# Patient Record
Sex: Female | Born: 1990
Health system: Southern US, Community
[De-identification: ages and names within clinical notes are randomized; demographics above are authoritative.]

## PROBLEM LIST (undated history)

## (undated) DIAGNOSIS — M419 Scoliosis, unspecified: Secondary | ICD-10-CM

## (undated) DIAGNOSIS — I4891 Unspecified atrial fibrillation: Secondary | ICD-10-CM

## (undated) DIAGNOSIS — J45909 Unspecified asthma, uncomplicated: Secondary | ICD-10-CM

## (undated) DIAGNOSIS — T7840XA Allergy, unspecified, initial encounter: Secondary | ICD-10-CM

## (undated) DIAGNOSIS — S83209A Unspecified tear of unspecified meniscus, current injury, unspecified knee, initial encounter: Secondary | ICD-10-CM

## (undated) DIAGNOSIS — I493 Ventricular premature depolarization: Secondary | ICD-10-CM

## (undated) DIAGNOSIS — I10 Essential (primary) hypertension: Secondary | ICD-10-CM

## (undated) DIAGNOSIS — R000 Tachycardia, unspecified: Secondary | ICD-10-CM

## (undated) DIAGNOSIS — F419 Anxiety disorder, unspecified: Secondary | ICD-10-CM

## (undated) DIAGNOSIS — B029 Zoster without complications: Secondary | ICD-10-CM

## (undated) DIAGNOSIS — F32A Depression, unspecified: Secondary | ICD-10-CM

## (undated) DIAGNOSIS — K219 Gastro-esophageal reflux disease without esophagitis: Secondary | ICD-10-CM

## (undated) DIAGNOSIS — N83209 Unspecified ovarian cyst, unspecified side: Secondary | ICD-10-CM

## (undated) HISTORY — DX: Ventricular premature depolarization: I49.3

## (undated) HISTORY — DX: Zoster without complications: B02.9

## (undated) HISTORY — DX: Unspecified asthma, uncomplicated: J45.909

## (undated) HISTORY — PX: MENISCUS REPAIR: SHX5179

## (undated) HISTORY — DX: Anxiety disorder, unspecified: F41.9

## (undated) HISTORY — DX: Allergy, unspecified, initial encounter: T78.40XA

## (undated) HISTORY — DX: Depression, unspecified: F32.A

## (undated) HISTORY — DX: Unspecified atrial fibrillation: I48.91

## (undated) HISTORY — DX: Scoliosis, unspecified: M41.9

## (undated) HISTORY — DX: Unspecified ovarian cyst, unspecified side: N83.209

---
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
## 2009-10-12 ENCOUNTER — Emergency Department (HOSPITAL_COMMUNITY): Admission: EM | Admit: 2009-10-12 | Discharge: 2009-10-12 | Payer: Self-pay | Admitting: Family Medicine

## 2010-02-20 ENCOUNTER — Emergency Department (HOSPITAL_COMMUNITY): Admission: EM | Admit: 2010-02-20 | Discharge: 2010-02-20 | Payer: Self-pay | Admitting: Emergency Medicine

## 2010-04-07 ENCOUNTER — Emergency Department (HOSPITAL_COMMUNITY)
Admission: EM | Admit: 2010-04-07 | Discharge: 2010-04-07 | Payer: Self-pay | Source: Home / Self Care | Admitting: Family Medicine

## 2010-07-15 LAB — POCT RAPID STREP A (OFFICE): Streptococcus, Group A Screen (Direct): NEGATIVE

## 2010-08-05 LAB — RAPID URINE DRUG SCREEN, HOSP PERFORMED
Amphetamines: NOT DETECTED
Barbiturates: NOT DETECTED
Benzodiazepines: NOT DETECTED
Cocaine: NOT DETECTED
Opiates: NOT DETECTED
Tetrahydrocannabinol: NOT DETECTED

## 2010-08-05 LAB — URINALYSIS, ROUTINE W REFLEX MICROSCOPIC
Bilirubin Urine: NEGATIVE
Glucose, UA: NEGATIVE mg/dL
Hgb urine dipstick: NEGATIVE
Ketones, ur: NEGATIVE mg/dL
Nitrite: NEGATIVE
Protein, ur: NEGATIVE mg/dL
Specific Gravity, Urine: 1.015 (ref 1.005–1.030)
Urobilinogen, UA: 0.2 mg/dL (ref 0.0–1.0)
pH: 6 (ref 5.0–8.0)

## 2010-08-05 LAB — URINE MICROSCOPIC-ADD ON

## 2010-08-05 LAB — POCT PREGNANCY, URINE: Preg Test, Ur: NEGATIVE

## 2010-10-14 ENCOUNTER — Inpatient Hospital Stay (INDEPENDENT_AMBULATORY_CARE_PROVIDER_SITE_OTHER)
Admission: RE | Admit: 2010-10-14 | Discharge: 2010-10-14 | Disposition: A | Discharge status: Home / Self Care | Payer: 59 | Source: Ambulatory Visit | Attending: Family Medicine | Admitting: Family Medicine

## 2010-10-14 DIAGNOSIS — N390 Urinary tract infection, site not specified: Secondary | ICD-10-CM

## 2010-10-14 LAB — POCT PREGNANCY, URINE: Preg Test, Ur: NEGATIVE

## 2010-10-14 LAB — POCT URINALYSIS DIP (DEVICE)
Glucose, UA: 500 mg/dL — AB
Hgb urine dipstick: NEGATIVE
Nitrite: POSITIVE — AB
Protein, ur: 100 mg/dL — AB
Specific Gravity, Urine: 1.01 (ref 1.005–1.030)
Urobilinogen, UA: 4 mg/dL — ABNORMAL HIGH (ref 0.0–1.0)
pH: 5 (ref 5.0–8.0)

## 2010-10-22 ENCOUNTER — Emergency Department (HOSPITAL_COMMUNITY)
Admission: EM | Admit: 2010-10-22 | Discharge: 2010-10-22 | Disposition: A | Discharge status: Home / Self Care | Payer: 59 | Attending: Emergency Medicine | Admitting: Emergency Medicine

## 2010-10-22 DIAGNOSIS — R300 Dysuria: Secondary | ICD-10-CM | POA: Insufficient documentation

## 2010-10-22 DIAGNOSIS — F988 Other specified behavioral and emotional disorders with onset usually occurring in childhood and adolescence: Secondary | ICD-10-CM | POA: Insufficient documentation

## 2010-10-22 DIAGNOSIS — N898 Other specified noninflammatory disorders of vagina: Secondary | ICD-10-CM | POA: Insufficient documentation

## 2010-10-22 DIAGNOSIS — R109 Unspecified abdominal pain: Secondary | ICD-10-CM | POA: Insufficient documentation

## 2010-10-22 DIAGNOSIS — N949 Unspecified condition associated with female genital organs and menstrual cycle: Secondary | ICD-10-CM | POA: Insufficient documentation

## 2010-10-22 LAB — BASIC METABOLIC PANEL
BUN: 13 mg/dL (ref 6–23)
CO2: 25 mEq/L (ref 19–32)
Calcium: 9.7 mg/dL (ref 8.4–10.5)
Chloride: 103 mEq/L (ref 96–112)
Creatinine, Ser: 0.59 mg/dL (ref 0.50–1.10)
GFR calc Af Amer: >60 mL/min (ref >60)
GFR calc non Af Amer: >60 mL/min (ref >60)
Glucose, Bld: 85 mg/dL (ref 70–99)
Potassium: 4 mEq/L (ref 3.5–5.1)
Sodium: 135 mEq/L (ref 135–145)

## 2010-10-22 LAB — URINALYSIS, ROUTINE W REFLEX MICROSCOPIC
Bilirubin Urine: NEGATIVE
Glucose, UA: NEGATIVE mg/dL
Hgb urine dipstick: NEGATIVE
Ketones, ur: NEGATIVE mg/dL
Nitrite: NEGATIVE
Protein, ur: NEGATIVE mg/dL
Specific Gravity, Urine: 1.018 (ref 1.005–1.030)
Urobilinogen, UA: 0.2 mg/dL (ref 0.0–1.0)
pH: 6.5 (ref 5.0–8.0)

## 2010-10-22 LAB — DIFFERENTIAL
Basophils Absolute: 0 10*3/uL (ref 0.0–0.1)
Basophils Relative: 0 % (ref 0–1)
Eosinophils Absolute: 0.3 10*3/uL (ref 0.0–0.7)
Eosinophils Relative: 3 % (ref 0–5)
Lymphocytes Relative: 24 % (ref 12–46)
Lymphs Abs: 1.9 10*3/uL (ref 0.7–4.0)
Monocytes Absolute: 0.4 10*3/uL (ref 0.1–1.0)
Monocytes Relative: 5 % (ref 3–12)
Neutro Abs: 5.6 10*3/uL (ref 1.7–7.7)
Neutrophils Relative %: 67 % (ref 43–77)

## 2010-10-22 LAB — URINE MICROSCOPIC-ADD ON

## 2010-10-22 LAB — CBC
HCT: 40.6 % (ref 36.0–46.0)
Hemoglobin: 14 g/dL (ref 12.0–15.0)
MCH: 28.2 pg (ref 26.0–34.0)
MCHC: 34.5 g/dL (ref 30.0–36.0)
MCV: 81.9 fL (ref 78.0–100.0)
Platelets: 211 10*3/uL (ref 150–400)
RBC: 4.96 MIL/uL (ref 3.87–5.11)
RDW: 13.1 % (ref 11.5–15.5)
WBC: 8.2 10*3/uL (ref 4.0–10.5)

## 2010-10-22 LAB — WET PREP, GENITAL
Clue Cells Wet Prep HPF POC: NONE SEEN
Trich, Wet Prep: NONE SEEN
Yeast Wet Prep HPF POC: NONE SEEN

## 2010-10-22 LAB — POCT PREGNANCY, URINE: Preg Test, Ur: NEGATIVE

## 2010-10-23 LAB — GC/CHLAMYDIA PROBE AMP, GENITAL
Chlamydia, DNA Probe: NEGATIVE
GC Probe Amp, Genital: NEGATIVE

## 2010-12-26 ENCOUNTER — Other Ambulatory Visit (HOSPITAL_COMMUNITY): Payer: 59

## 2010-12-26 ENCOUNTER — Emergency Department (HOSPITAL_COMMUNITY): Payer: 59

## 2010-12-26 ENCOUNTER — Emergency Department (HOSPITAL_COMMUNITY)
Admission: EM | Admit: 2010-12-26 | Discharge: 2010-12-26 | Disposition: A | Discharge status: Home / Self Care | Payer: 59 | Attending: Emergency Medicine | Admitting: Emergency Medicine

## 2010-12-26 DIAGNOSIS — R109 Unspecified abdominal pain: Secondary | ICD-10-CM | POA: Insufficient documentation

## 2010-12-26 DIAGNOSIS — R300 Dysuria: Secondary | ICD-10-CM | POA: Insufficient documentation

## 2010-12-26 DIAGNOSIS — N949 Unspecified condition associated with female genital organs and menstrual cycle: Secondary | ICD-10-CM | POA: Insufficient documentation

## 2010-12-26 DIAGNOSIS — F988 Other specified behavioral and emotional disorders with onset usually occurring in childhood and adolescence: Secondary | ICD-10-CM | POA: Insufficient documentation

## 2010-12-26 DIAGNOSIS — N898 Other specified noninflammatory disorders of vagina: Secondary | ICD-10-CM | POA: Insufficient documentation

## 2010-12-26 LAB — URINALYSIS, ROUTINE W REFLEX MICROSCOPIC
Bilirubin Urine: NEGATIVE
Glucose, UA: NEGATIVE mg/dL
Hgb urine dipstick: NEGATIVE
Ketones, ur: NEGATIVE mg/dL
Nitrite: NEGATIVE
Protein, ur: NEGATIVE mg/dL
Specific Gravity, Urine: 1.016 (ref 1.005–1.030)
Urobilinogen, UA: 0.2 mg/dL (ref 0.0–1.0)
pH: 7 (ref 5.0–8.0)

## 2010-12-26 LAB — WET PREP, GENITAL
Clue Cells Wet Prep HPF POC: NONE SEEN
Trich, Wet Prep: NONE SEEN
Yeast Wet Prep HPF POC: NONE SEEN

## 2010-12-26 LAB — URINE MICROSCOPIC-ADD ON

## 2010-12-26 LAB — POCT PREGNANCY, URINE: Preg Test, Ur: NEGATIVE

## 2010-12-27 LAB — GC/CHLAMYDIA PROBE AMP, GENITAL
Chlamydia, DNA Probe: NEGATIVE
GC Probe Amp, Genital: NEGATIVE

## 2011-01-01 ENCOUNTER — Ambulatory Visit (INDEPENDENT_AMBULATORY_CARE_PROVIDER_SITE_OTHER): Payer: 59

## 2011-01-01 ENCOUNTER — Inpatient Hospital Stay (INDEPENDENT_AMBULATORY_CARE_PROVIDER_SITE_OTHER)
Admission: RE | Admit: 2011-01-01 | Discharge: 2011-01-01 | Disposition: A | Discharge status: Home / Self Care | Payer: 59 | Source: Ambulatory Visit | Attending: Family Medicine | Admitting: Family Medicine

## 2011-01-01 DIAGNOSIS — S93409A Sprain of unspecified ligament of unspecified ankle, initial encounter: Secondary | ICD-10-CM

## 2011-05-03 DIAGNOSIS — F900 Attention-deficit hyperactivity disorder, predominantly inattentive type: Secondary | ICD-10-CM | POA: Insufficient documentation

## 2011-12-14 ENCOUNTER — Emergency Department (HOSPITAL_COMMUNITY): Payer: Worker's Compensation

## 2011-12-14 ENCOUNTER — Encounter (HOSPITAL_COMMUNITY): Payer: Self-pay | Admitting: *Deleted

## 2011-12-14 ENCOUNTER — Emergency Department (HOSPITAL_COMMUNITY)
Admission: EM | Admit: 2011-12-14 | Discharge: 2011-12-15 | Disposition: A | Discharge status: Home / Self Care | Payer: Worker's Compensation | Attending: Emergency Medicine | Admitting: Emergency Medicine

## 2011-12-14 DIAGNOSIS — W268XXA Contact with other sharp object(s), not elsewhere classified, initial encounter: Secondary | ICD-10-CM | POA: Insufficient documentation

## 2011-12-14 DIAGNOSIS — S61209A Unspecified open wound of unspecified finger without damage to nail, initial encounter: Secondary | ICD-10-CM | POA: Insufficient documentation

## 2011-12-14 DIAGNOSIS — IMO0002 Reserved for concepts with insufficient information to code with codable children: Secondary | ICD-10-CM

## 2011-12-14 NOTE — ED Notes (Signed)
Pt sustained laceration to R index finger from broken glass tonight at work.

## 2011-12-15 ENCOUNTER — Encounter (HOSPITAL_COMMUNITY): Payer: Self-pay | Admitting: *Deleted

## 2011-12-15 ENCOUNTER — Emergency Department (HOSPITAL_COMMUNITY)
Admission: EM | Admit: 2011-12-15 | Discharge: 2011-12-15 | Disposition: A | Discharge status: Home / Self Care | Payer: 59 | Source: Home / Self Care | Attending: Emergency Medicine | Admitting: Emergency Medicine

## 2011-12-15 DIAGNOSIS — N390 Urinary tract infection, site not specified: Secondary | ICD-10-CM

## 2011-12-15 LAB — POCT URINALYSIS DIP (DEVICE)
Glucose, UA: 500 mg/dL — AB
Hgb urine dipstick: NEGATIVE
Ketones, ur: 15 mg/dL — AB
Nitrite: POSITIVE — AB
Protein, ur: 100 mg/dL — AB
Specific Gravity, Urine: 1.015 (ref 1.005–1.030)
Urobilinogen, UA: 4 mg/dL — ABNORMAL HIGH (ref 0.0–1.0)
pH: 5 (ref 5.0–8.0)

## 2011-12-15 LAB — POCT PREGNANCY, URINE: Preg Test, Ur: NEGATIVE

## 2011-12-15 MED ORDER — CEPHALEXIN 500 MG PO CAPS: 500.0000 mg | ORAL_CAPSULE | Freq: Three times a day (TID) | ORAL | Status: AC

## 2011-12-15 MED ORDER — HYDROCODONE-ACETAMINOPHEN 5-325 MG PO TABS: 1.0000 | ORAL_TABLET | Freq: Four times a day (QID) | ORAL | Status: AC | PRN

## 2011-12-15 MED ORDER — HYDROCODONE-ACETAMINOPHEN 5-325 MG PO TABS: 1.0000 | ORAL_TABLET | Freq: Four times a day (QID) | ORAL | Status: DC | PRN

## 2011-12-15 NOTE — ED Provider Notes (Signed)
History     CSN: [id number]  Arrival date & time 12/15/11  1519   First MD Initiated Contact with Patient 12/15/11 1522      Chief Complaint  Patient presents with  . Urinary Tract Infection    (Consider location/radiation/quality/duration/timing/severity/associated sxs/prior treatment) HPI Comments: Patient presents to urgent care complaining of burning, pressure, increased frequency with urination for the last 3 days. No vaginal discharge, denies any fevers, nausea vomiting or flank pain. She feels it's a urine infection.  Patient is a 21 y.o. female presenting with urinary tract infection. The history is provided by the patient.  Urinary Tract Infection This is a new problem. The current episode started more than 2 days ago. The problem occurs constantly. The problem has not changed since onset.Pertinent negatives include no abdominal pain and no headaches. She has tried nothing for the symptoms.    History reviewed. No pertinent past medical history.  History reviewed. No pertinent past surgical history.  No family history on file.  History  Substance Use Topics  . Smoking status: Never Smoker   . Smokeless tobacco: Not on file  . Alcohol Use: Yes    OB History    Grav Para Term Preterm Abortions TAB SAB Ect Mult Living                  Review of Systems  Constitutional: Negative for fever, activity change, appetite change and fatigue.  Gastrointestinal: Negative for abdominal pain.  Genitourinary: Positive for dysuria, urgency and frequency. Negative for hematuria, flank pain, decreased urine volume, vaginal bleeding, vaginal discharge, genital sores, vaginal pain and pelvic pain.  Neurological: Negative for headaches.    Allergies  Review of patient's allergies indicates no known allergies.  Home Medications   Current Outpatient Rx  Name Route Sig Dispense Refill  . CEPHALEXIN 500 MG PO CAPS Oral Take 1 capsule (500 mg total) by mouth 3 (three) times  daily. 21 capsule 0  . HYDROCODONE-ACETAMINOPHEN 5-325 MG PO TABS Oral Take 1 tablet by mouth every 6 (six) hours as needed for pain. 15 tablet 0    BP 118/72  Pulse 76  Temp 97.8 F (36.6 C) (Oral)  Resp 18  SpO2 98%  LMP 12/04/2011  Physical Exam  Nursing note and vitals reviewed. Constitutional: She appears well-developed and well-nourished.  Pulmonary/Chest: Effort normal and breath sounds normal.  Abdominal: She exhibits no distension and no mass. There is tenderness. There is no rebound.    Neurological: She is alert.  Skin: Skin is warm. No rash noted. No erythema.    ED Course  Procedures (including critical care time)  Labs Reviewed  POCT URINALYSIS DIP (DEVICE) - Abnormal; Notable for the following:    Glucose, UA 500 (*)     Bilirubin Urine SMALL (*)     Ketones, ur 15 (*)     Protein, ur 100 (*)     Urobilinogen, UA 4.0 (*)     Nitrite POSITIVE (*)     Leukocytes, UA LARGE (*)  Biochemical Testing Only. Please order routine urinalysis from main lab if confirmatory testing is needed.   All other components within normal limits  POCT PREGNANCY, URINE   Dg Finger Index Right  12/14/2011  *RADIOLOGY REPORT*  Clinical Data: Laceration to right index finger.  RIGHT INDEX FINGER 2+V  Comparison: None.  Findings: Gauze material overlies the right second finger.  Bones appear intact. No evidence of acute fracture or subluxation.  No focal bone lesions.  Bone  matrix and cortex appear intact.  No abnormal radiopaque densities in the soft tissues.  IMPRESSION: No acute bony abnormalities.  No radiopaque soft tissue foreign bodies.  Original Report Authenticated By: Marlon Pel, M.D.     1. Urinary tract infection       MDM  Uncomplicated urinary tract infection. Afebrile no flank pain, normal soft abdomen. Patient was provided with a prescription of Keflex to be used for 7 days and encourage her to drink abundant fluids. Have also describe what symptoms should  warrant further evaluation of her return and to expect a remarkable improvement in the next 48 hours.        Jimmie Molly, MD 12/15/11 [phone]

## 2011-12-15 NOTE — ED Notes (Signed)
Pt is here with complaints of urinary urgency, frequency and burning X 3 days.  Pt hurt finger at work and was seen in [facility] ED last night, but did not mention possible UTI at that time.

## 2011-12-15 NOTE — ED Provider Notes (Signed)
History     CSN: [id number]  Arrival date & time 12/14/11  2214   First MD Initiated Contact with Patient 12/15/11 0001      Chief Complaint  Patient presents with  . Extremity Laceration    (Consider location/radiation/quality/duration/timing/severity/associated sxs/prior treatment) Patient is a 21 y.o. female presenting with skin laceration. The history is provided by the patient.  Laceration  The incident occurred 3 to 5 hours ago. Pain location: right index finger, palmer surface. The laceration is 1 cm in size. The laceration mechanism was a broken glass. The pain is at a severity of 5/10. The pain is mild. The pain has been constant since onset. She reports no foreign bodies present. Her tetanus status is UTD.    History reviewed. No pertinent past medical history.  History reviewed. No pertinent past surgical history.  History reviewed. No pertinent family history.  History  Substance Use Topics  . Smoking status: Never Smoker   . Smokeless tobacco: Not on file  . Alcohol Use: Yes    OB History    Grav Para Term Preterm Abortions TAB SAB Ect Mult Living                  Review of Systems  Constitutional: Negative for fever, diaphoresis and activity change.  HENT: Negative for congestion and neck pain.   Respiratory: Negative for cough.   Genitourinary: Negative for dysuria.  Musculoskeletal: Negative for myalgias.  Skin: Positive for wound. Negative for color change.  Neurological: Negative for weakness, numbness and headaches.  All other systems reviewed and are negative.    Allergies  Review of patient's allergies indicates no known allergies.  Home Medications   Current Outpatient Rx  Name Route Sig Dispense Refill  . HYDROCODONE-ACETAMINOPHEN 5-325 MG PO TABS Oral Take 1 tablet by mouth every 6 (six) hours as needed for pain. 15 tablet 0    BP 147/83  Pulse 90  Temp 97.6 F (36.4 C) (Oral)  Resp 17  Ht 5\' 4"  (1.626 m)  Wt 215 lb (97.523  kg)  BMI 36.90 kg/m2  SpO2 99%  LMP 12/04/2011  Physical Exam  Nursing note and vitals reviewed. Constitutional: She is oriented to person, place, and time. She appears well-developed and well-nourished. No distress.  HENT:  Head: Normocephalic and atraumatic.  Eyes: Conjunctivae and EOM are normal.  Neck: Normal range of motion.  Cardiovascular:       Intact distal pulses, capillary refill less than 3 seconds  Pulmonary/Chest: Effort normal.  Musculoskeletal: Normal range of motion.       Normal range of motion including flexion and extension of MCP, DIP, and PIP  Neurological: She is alert and oriented to person, place, and time.       Intact distal sensation.  Skin: Skin is warm and dry. No rash noted. She is not diaphoretic.       1 cm laceration located on right index finger palmar surface across PIP.  Bleeding controlled.  Psychiatric: She has a normal mood and affect. Her behavior is normal.    ED Course  Procedures (including critical care time)  Labs Reviewed - No data to display Dg Finger Index Right  12/14/2011  *RADIOLOGY REPORT*  Clinical Data: Laceration to right index finger.  RIGHT INDEX FINGER 2+V  Comparison: None.  Findings: Gauze material overlies the right second finger.  Bones appear intact. No evidence of acute fracture or subluxation.  No focal bone lesions.  Bone matrix and cortex appear  intact.  No abnormal radiopaque densities in the soft tissues.  IMPRESSION: No acute bony abnormalities.  No radiopaque soft tissue foreign bodies.  Original Report Authenticated By: Marlon Pel, M.D.   the LACERATION REPAIR Performed by: Jaci Carrel Authorized by: Jaci Carrel Consent: Verbal consent obtained. Risks and benefits: risks, benefits and alternatives were discussed Consent given by: patient Patient identity confirmed: provided demographic data Prepped and Draped in normal sterile fashion Wound explored  Laceration Location: palmar surface right  index finger   Laceration Length:  1 cm  No Foreign Bodies seen or palpated  Anesthesia: local infiltration  Local anesthetic: lidocaine  2%  without epinephrine  Anesthetic total: 2 ml  Irrigation method: syringe Amount of cleaning: standard  Skin closure:  4.0 Prolene  Number of sutures: 4  Technique: simple interupted  Patient tolerance: Patient tolerated the procedure well with no immediate complications.   1. Laceration       MDM  Laceration  Tdap up-to-date.Pressure irrigation performed.  No evidence of tendon sheath involvement.  Intact distal sensation and normal range of motion. Laceration occurred < 8 hours prior to repair which was well tolerated. Pt has no co morbidities to effect normal wound healing. Discussed suture home care w pt and answered questions. Pt to f-u for wound check and suture removal in 7-10 days. Pt is hemodynamically stable w no complaints prior to dc.           Jaci Carrel, [place] 12/15/11 0126

## 2011-12-15 NOTE — ED Provider Notes (Signed)
Medical screening examination/treatment/procedure(s) were performed by non-physician practitioner and as supervising physician I was immediately available for consultation/collaboration.  Doug Sou, MD 12/15/11 [phone]

## 2011-12-24 ENCOUNTER — Encounter (HOSPITAL_COMMUNITY): Payer: Self-pay

## 2011-12-24 ENCOUNTER — Emergency Department (HOSPITAL_COMMUNITY)
Admission: EM | Admit: 2011-12-24 | Discharge: 2011-12-24 | Disposition: A | Discharge status: Home / Self Care | Payer: Worker's Compensation | Attending: Emergency Medicine | Admitting: Emergency Medicine

## 2011-12-24 DIAGNOSIS — Z5189 Encounter for other specified aftercare: Secondary | ICD-10-CM

## 2011-12-24 DIAGNOSIS — Z4802 Encounter for removal of sutures: Secondary | ICD-10-CM | POA: Insufficient documentation

## 2011-12-24 DIAGNOSIS — Z4801 Encounter for change or removal of surgical wound dressing: Secondary | ICD-10-CM | POA: Insufficient documentation

## 2011-12-24 NOTE — ED Notes (Signed)
[id number]<AV> Expected date:<BR> Expected time:<BR> Means of arrival:<BR> Comments:<BR> [place]

## 2011-12-24 NOTE — ED Notes (Signed)
Pt w/ wound dehiscence to rt index finger s/p suture removal today - no bleeding noted - pt in no acute distress and denies pain.

## 2011-12-24 NOTE — ED Notes (Addendum)
Pt here for right finger suture removal, suture been there x 8 days. No signs of infection noted. Wound well healed

## 2011-12-24 NOTE — ED Provider Notes (Signed)
History     CSN: [id number]  Arrival date & time 12/24/11  1502   First MD Initiated Contact with Patient 12/24/11 1604      No chief complaint on file.   (Consider location/radiation/quality/duration/timing/severity/associated sxs/prior treatment) Patient is a 21 y.o. female presenting with wound check. The history is provided by the patient.  Wound Check  Treated in ED: 9 days ago  Previous treatment in the ED includes laceration repair. There has been no treatment since the wound repair. Fever duration: no fever. There has been no drainage from the wound. There is no redness present. There is no swelling present. The pain has improved. She has no difficulty moving the affected extremity or digit.    History reviewed. No pertinent past medical history.  History reviewed. No pertinent past surgical history.  No family history on file.  History  Substance Use Topics  . Smoking status: Never Smoker   . Smokeless tobacco: Not on file  . Alcohol Use: Yes    OB History    Grav Para Term Preterm Abortions TAB SAB Ect Mult Living                  Review of Systems  Constitutional: Negative for fever, diaphoresis and activity change.  HENT: Negative for congestion and neck pain.   Respiratory: Negative for cough.   Genitourinary: Negative for dysuria.  Musculoskeletal: Negative for myalgias.  Skin: Negative for color change and wound.  Neurological: Negative for headaches.  All other systems reviewed and are negative.    Allergies  Review of patient's allergies indicates no known allergies.  Home Medications   Current Outpatient Rx  Name Route Sig Dispense Refill  . CEPHALEXIN 500 MG PO CAPS Oral Take 1 capsule (500 mg total) by mouth 3 (three) times daily. 21 capsule 0  . HYDROCODONE-ACETAMINOPHEN 5-325 MG PO TABS Oral Take 1 tablet by mouth every 6 (six) hours as needed for pain. 15 tablet 0  . PRESCRIPTION MEDICATION Oral Take 1 tablet by mouth daily. Birth  Control, taken for ovarian cysts.      BP 121/78  Pulse 77  Temp 98.6 F (37 C) (Oral)  Resp 16  SpO2 97%  LMP 12/04/2011  Physical Exam  Nursing note and vitals reviewed. Constitutional: She appears well-developed and well-nourished. No distress.  HENT:  Head: Normocephalic and atraumatic.  Eyes: Conjunctivae and EOM are normal.  Neck: Normal range of motion. Neck supple.  Cardiovascular:       Intact distal pulses, capillary refill < 3 seconds  Musculoskeletal:       Normal ROM if digit w flexion and extension intact   Neurological:       No sensory deficit  Skin: She is not diaphoretic.       Skin intact, no erythema or purulent drainage. No streaking of arm     ED Course  SUTURE REMOVAL Date/Time: 12/24/2011 4:15 PM Performed by: Jaci Carrel Authorized by: Jaci Carrel Consent: Verbal consent obtained. Risks and benefits: risks, benefits and alternatives were discussed Consent given by: patient Patient understanding: patient states understanding of the procedure being performed Patient consent: the patient's understanding of the procedure matches consent given Patient identity confirmed: verbally with patient and arm band Body area: upper extremity Location details: left index finger Wound Appearance: clean Sutures Removed: 4 Post-removal: dressing applied Facility: sutures placed in this facility Patient tolerance: Patient tolerated the procedure well with no immediate complications.   (including critical care time)  Labs  Reviewed - No data to display No results found.   No diagnosis found.    MDM  Wound check/suture removal  Pt presents to ER for wound check. Sutures placed 9 days ago and healing appropriately.Mild pain with digit flexion. Advised to f-u w hand in 1 week if symptom persists. No evidence of infection, return precautions again discussed. Home care discussed. Normal ROM and sensation intact.        Jaci Carrel, [place] 12/24/11  1620

## 2011-12-24 NOTE — ED Notes (Signed)
Pt's finger suture was removed a few hours ago at [facility], and the wound now has opened up.

## 2011-12-24 NOTE — ED Notes (Signed)
Pt ambulating independently w/ steady gait on d/c in no acute distress, A&Ox4. D/c instructions reviewed w/ pt - pt denies any further questions or concerns at present.  

## 2011-12-25 NOTE — ED Provider Notes (Signed)
History     CSN: [id number]  Arrival date & time 12/24/11  4098   First MD Initiated Contact with Patient 12/24/11 1949      Chief Complaint  Patient presents with  . Wound Dehiscence    (Consider location/radiation/quality/duration/timing/severity/associated sxs/prior treatment) HPI Comments: Pt seen earlier today by myself for suture removal of a laceration performed 9 days ago. After being discharged pt states that she hit her hand and was concerned that the wound was opening. She denies any bleeding or draining from wound. No other complaints   The history is provided by the patient.    History reviewed. No pertinent past medical history.  History reviewed. No pertinent past surgical history.  No family history on file.  History  Substance Use Topics  . Smoking status: Never Smoker   . Smokeless tobacco: Not on file  . Alcohol Use: Yes    OB History    Grav Para Term Preterm Abortions TAB SAB Ect Mult Living                  Review of Systems  Constitutional: Negative for fever, diaphoresis and activity change.  HENT: Negative for congestion and neck pain.   Respiratory: Negative for cough.   Genitourinary: Negative for dysuria.  Musculoskeletal: Negative for myalgias.  Skin: Positive for wound. Negative for color change.  Neurological: Negative for headaches.  All other systems reviewed and are negative.    Allergies  Review of patient's allergies indicates no known allergies.  Home Medications   Current Outpatient Rx  Name Route Sig Dispense Refill  . CEPHALEXIN 500 MG PO CAPS Oral Take 1 capsule (500 mg total) by mouth 3 (three) times daily. 21 capsule 0  . HYDROCODONE-ACETAMINOPHEN 5-325 MG PO TABS Oral Take 1 tablet by mouth every 6 (six) hours as needed for pain. 15 tablet 0  . PRESCRIPTION MEDICATION Oral Take 1 tablet by mouth daily. Birth Control, taken for ovarian cysts.      BP 113/77  Pulse 89  Temp 98.1 F (36.7 C) (Oral)  Resp 16   SpO2 98%  LMP 12/04/2011  Physical Exam  Nursing note and vitals reviewed. Constitutional: She is oriented to person, place, and time. She appears well-developed and well-nourished. No distress.  HENT:  Head: Normocephalic and atraumatic.  Eyes: Conjunctivae and EOM are normal.  Neck: Normal range of motion.  Pulmonary/Chest: Effort normal.  Musculoskeletal: Normal range of motion.  Neurological: She is alert and oriented to person, place, and time.  Skin: Skin is warm and dry. No rash noted. She is not diaphoretic.       Healing wound located on index finger, no drainage or erythema. Very superficial dehiscence.    Psychiatric: She has a normal mood and affect. Her behavior is normal.    ED Course  Procedures (including critical care time)  Labs Reviewed - No data to display No results found.   1. Visit for wound check       MDM  Wound check after suture removal earlier today  Steri strips placed, home care instructions  & return precautions discussed.         Jaci Carrel, [place] 12/25/11 0128

## 2011-12-25 NOTE — ED Provider Notes (Signed)
Medical screening examination/treatment/procedure(s) were performed by non-physician practitioner and as supervising physician I was immediately available for consultation/collaboration.  Jillayne Witte R. Mana Haberl, MD 12/25/11 0007 

## 2011-12-28 NOTE — ED Provider Notes (Signed)
Medical screening examination/treatment/procedure(s) were performed by non-physician practitioner and as supervising physician I was immediately available for consultation/collaboration.  Kariel Skillman R. Lovely Kerins, MD 12/28/11 0656 

## 2012-01-31 ENCOUNTER — Other Ambulatory Visit (HOSPITAL_COMMUNITY): Payer: Self-pay | Admitting: Sports Medicine

## 2012-01-31 DIAGNOSIS — M25562 Pain in left knee: Secondary | ICD-10-CM

## 2012-02-04 ENCOUNTER — Ambulatory Visit (HOSPITAL_COMMUNITY)
Admission: RE | Admit: 2012-02-04 | Discharge: 2012-02-04 | Disposition: A | Discharge status: Home / Self Care | Payer: 59 | Source: Ambulatory Visit | Attending: Sports Medicine | Admitting: Sports Medicine

## 2012-02-04 DIAGNOSIS — S83289A Other tear of lateral meniscus, current injury, unspecified knee, initial encounter: Secondary | ICD-10-CM | POA: Insufficient documentation

## 2012-02-04 DIAGNOSIS — M25562 Pain in left knee: Secondary | ICD-10-CM

## 2012-02-04 DIAGNOSIS — X58XXXA Exposure to other specified factors, initial encounter: Secondary | ICD-10-CM | POA: Insufficient documentation

## 2012-02-28 HISTORY — PX: OTHER SURGICAL HISTORY: SHX169

## 2012-07-07 IMAGING — CR DG ANKLE COMPLETE 3+V*L*
3 series · 3 of 3 positions shown · non-contrast
Comparison: None.

CLINICAL DATA: Twisting injury, pain.

LEFT ANKLE COMPLETE - 3+ VIEW

[view not recorded (1 of 3)]
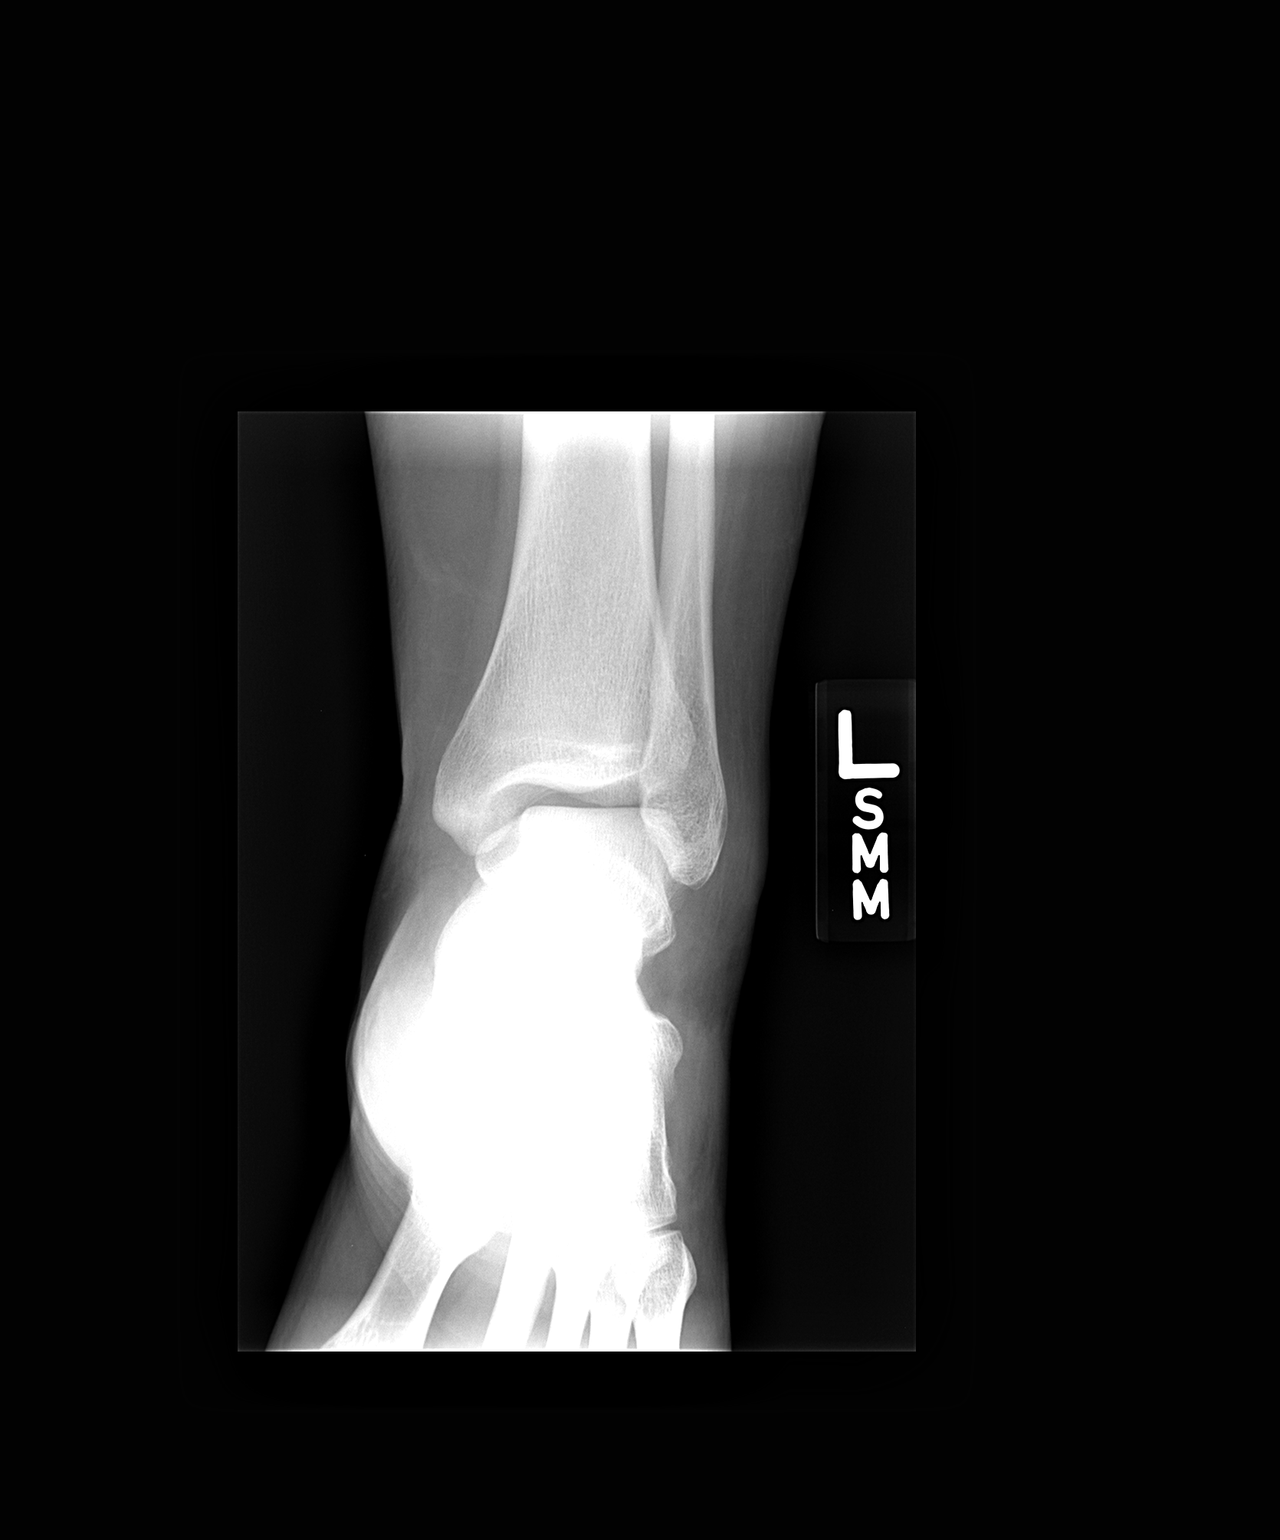

[view not recorded (2 of 3)]
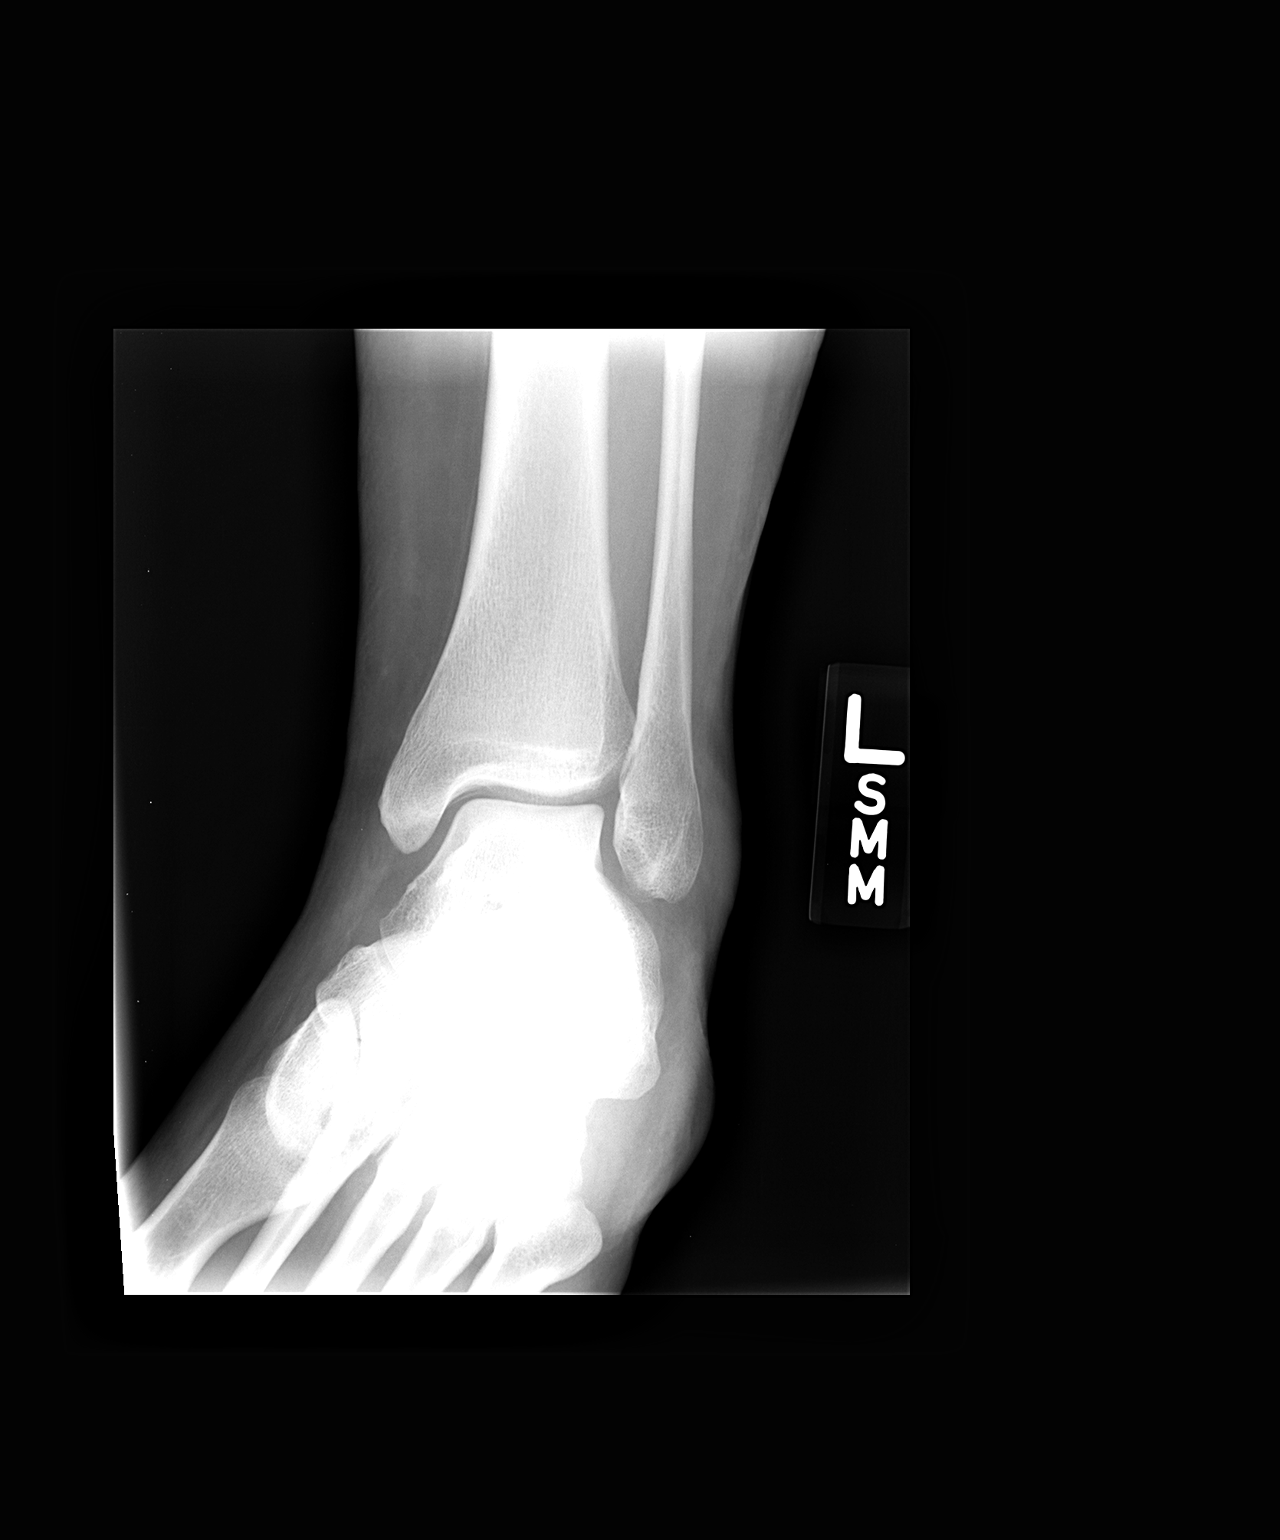

[view not recorded (3 of 3)]
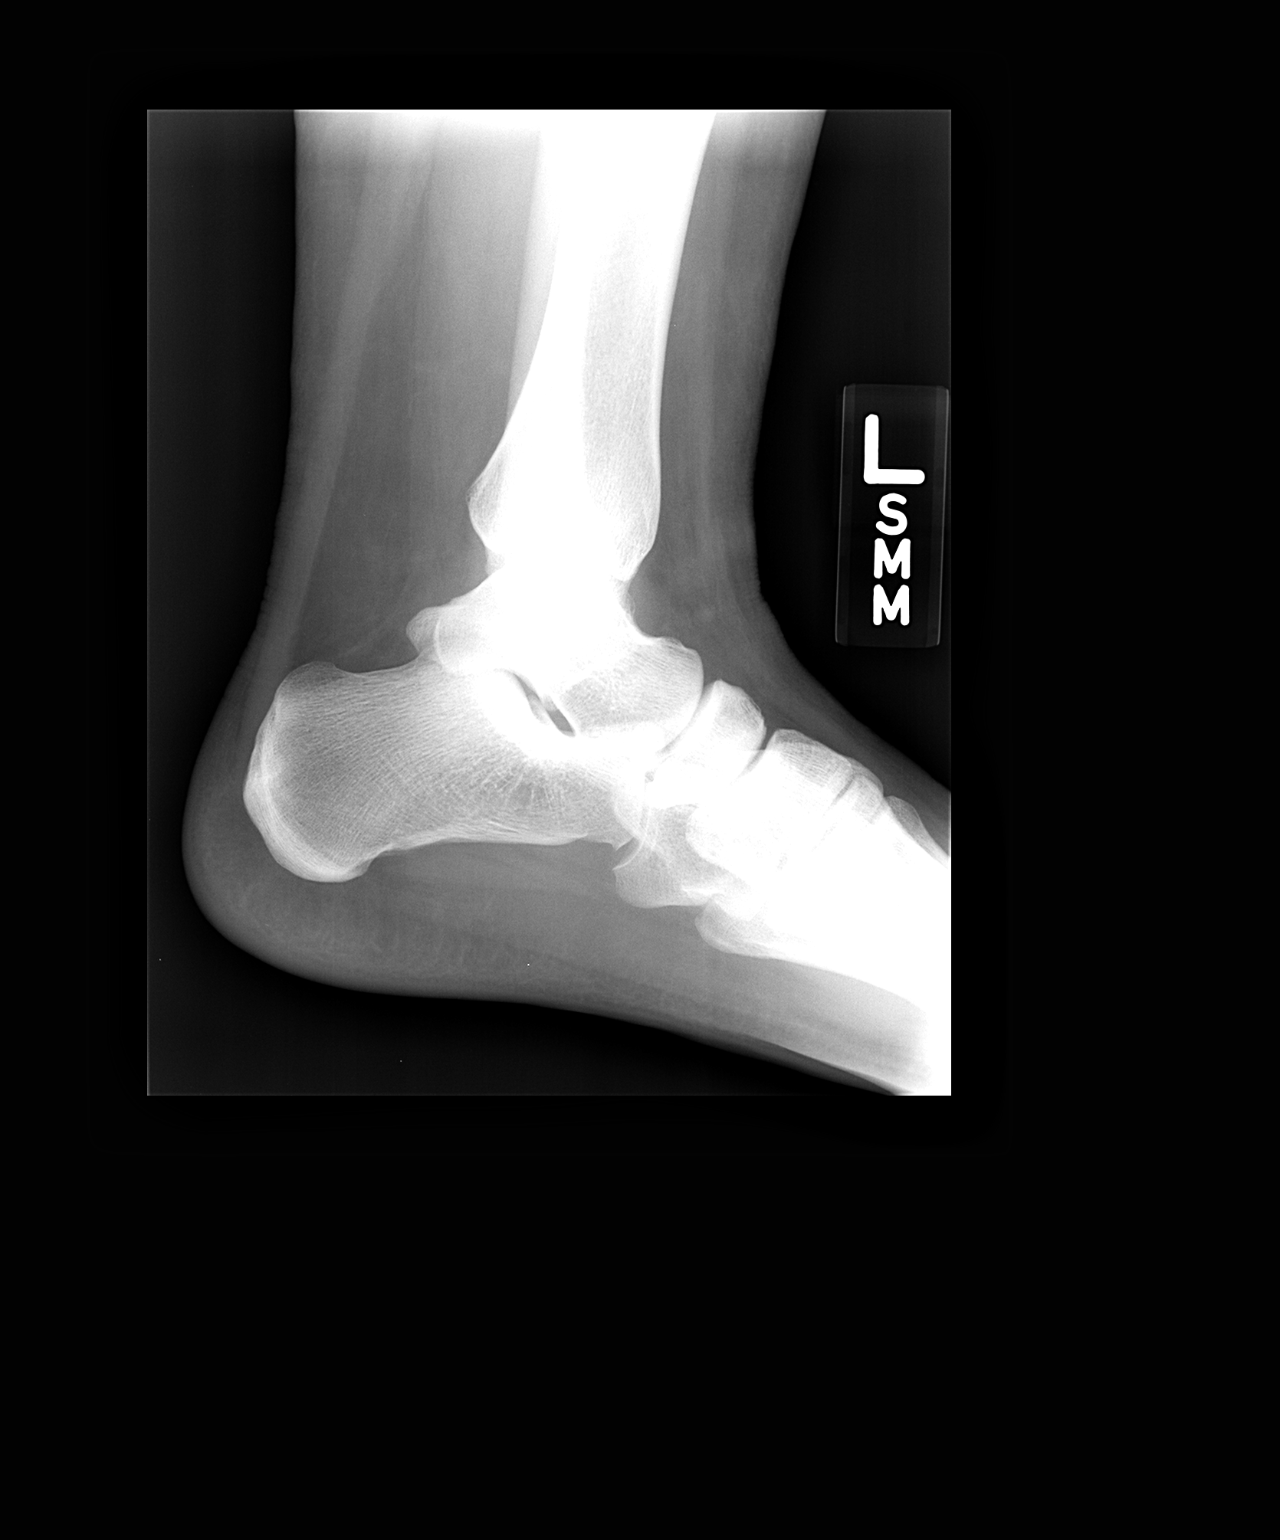

[3 of 3 positions shown; findings below may reference images not displayed]

FINDINGS: Soft tissue swelling over the ankle laterally. No acute
bony abnormality.  Specifically, no fracture, subluxation, or
dislocation.  Soft tissues are intact.
IMPRESSION: No acute bony abnormality.

## 2012-08-26 NOTE — H&P (Signed)
Theresa Ortiz is an 22 y.o. female.   Chief Complaint: left knee pain HPI: The patient is a 22 year old female who presents with knee complaints. The patient reports left knee symptoms including: pain, swelling, stiffness and soreness which began 6 months ago following a specific injury. She was playing softball and she twisted her left knee. The patient describes the severity of the symptoms as moderate in severity.The patient feels that the symptoms are unchanging. Symptoms are reported to be located in the left lateral knee and include knee pain, lateral knee pain, tenderness, swelling, stiffness, decreased range of motion, difficulty bearing weight and difficulty ambulating, while symptoms reported today do not include warmth, redness, locking or instability. The patient does not report any radiation of symptoms. Symptoms are exacerbated by motion at the knee and weight bearing. Symptoms are relieved by rest until February when her symptoms no longer responded with rest. MRI revealed lateral meniscus tear in the left knee.   No pertinent PMH  No pertinent surgical history  Family  Cancer. grandfather mothers side Diabetes Mellitus. father Heart Disease. grandmother fathers side Hypertension. grandmother mothers side Osteoarthritis. mother Severe allergy. sister  Social History:  reports that she has never smoked. She does not have any smokeless tobacco history on file. She reports that  drinks alcohol. She reports that she does not use illicit drugs.  Allergies: No Known Allergies   Review of Systems  Constitutional: Negative.   HENT: Negative.  Negative for neck pain.   Eyes: Negative.   Respiratory: Negative.   Cardiovascular: Negative.   Gastrointestinal: Negative.   Genitourinary: Negative.   Musculoskeletal: Positive for joint pain and falls. Negative for myalgias and back pain.       Left knee pain   Skin: Negative.   Neurological: Negative.    Endo/Heme/Allergies: Negative.   Psychiatric/Behavioral: Negative.    Vitals Weight: 230 lb Height: 64 in Body Surface Area: 2.17 m Body Mass Index: 39.48 kg/m BP: 133/77 (Sitting, Left Arm, Standard)  Physical Exam  Constitutional: She is oriented to person, place, and time. She appears well-developed and well-nourished. No distress.  HENT:  Head: Normocephalic and atraumatic.  Right Ear: External ear normal.  Left Ear: External ear normal.  Nose: Nose normal.  Mouth/Throat: Oropharynx is clear and moist.  Eyes: Conjunctivae and EOM are normal.  Neck: Normal range of motion. Neck supple. No tracheal deviation present. No thyromegaly present.  Cardiovascular: Normal rate, regular rhythm, normal heart sounds and intact distal pulses.   No murmur heard. Respiratory: Effort normal and breath sounds normal. No respiratory distress. She has no wheezes. She exhibits no tenderness.  GI: Soft. Bowel sounds are normal. She exhibits no distension and no mass. There is no tenderness.  Musculoskeletal:       Right hip: Normal.       Left hip: Normal.       Right knee: Normal.       Left knee: She exhibits decreased range of motion and swelling. Tenderness found. Lateral joint line tenderness noted. No medial joint line tenderness noted.       Right lower leg: She exhibits no tenderness and no swelling.       Left lower leg: She exhibits no tenderness and no swelling.  Lymphadenopathy:    She has no cervical adenopathy.  Neurological: She is alert and oriented to person, place, and time. She has normal strength and normal reflexes. No sensory deficit.  Skin: No rash noted. She is not  diaphoretic. No erythema.  Psychiatric: She has a normal mood and affect. Her behavior is normal.     Assessment/Plan Left knee lateral meniscus tear She needs a left knee arthroscopy with lateral menisectomy with possible open lateral release.  Risks and benefits of the surgery were discussed with  the patient by Dr. Darrelyn Hillock.   Tamecia Mcdougald LAUREN 08/26/2012, 3:46 PM

## 2012-08-27 ENCOUNTER — Encounter (HOSPITAL_BASED_OUTPATIENT_CLINIC_OR_DEPARTMENT_OTHER): Payer: Self-pay | Admitting: *Deleted

## 2012-08-27 NOTE — Progress Notes (Signed)
NPO AFTER MN. ARRIVES AT 1030. NEEDS HG AND URINE PREG.

## 2012-09-01 ENCOUNTER — Encounter (HOSPITAL_BASED_OUTPATIENT_CLINIC_OR_DEPARTMENT_OTHER)
Admission: RE | Disposition: A | Discharge status: Home / Self Care | Payer: Self-pay | Source: Ambulatory Visit | Attending: Orthopedic Surgery

## 2012-09-01 ENCOUNTER — Ambulatory Visit (HOSPITAL_BASED_OUTPATIENT_CLINIC_OR_DEPARTMENT_OTHER): Payer: 59 | Admitting: Anesthesiology

## 2012-09-01 ENCOUNTER — Encounter (HOSPITAL_BASED_OUTPATIENT_CLINIC_OR_DEPARTMENT_OTHER): Payer: Self-pay | Admitting: Anesthesiology

## 2012-09-01 ENCOUNTER — Ambulatory Visit (HOSPITAL_BASED_OUTPATIENT_CLINIC_OR_DEPARTMENT_OTHER)
Admission: RE | Admit: 2012-09-01 | Discharge: 2012-09-01 | Disposition: A | Discharge status: Home / Self Care | Payer: 59 | Source: Ambulatory Visit | Attending: Orthopedic Surgery | Admitting: Orthopedic Surgery

## 2012-09-01 ENCOUNTER — Encounter (HOSPITAL_BASED_OUTPATIENT_CLINIC_OR_DEPARTMENT_OTHER): Payer: Self-pay | Admitting: *Deleted

## 2012-09-01 DIAGNOSIS — X500XXA Overexertion from strenuous movement or load, initial encounter: Secondary | ICD-10-CM | POA: Insufficient documentation

## 2012-09-01 DIAGNOSIS — S83282A Other tear of lateral meniscus, current injury, left knee, initial encounter: Secondary | ICD-10-CM

## 2012-09-01 DIAGNOSIS — S83289A Other tear of lateral meniscus, current injury, unspecified knee, initial encounter: Secondary | ICD-10-CM | POA: Insufficient documentation

## 2012-09-01 DIAGNOSIS — Y9364 Activity, baseball: Secondary | ICD-10-CM | POA: Insufficient documentation

## 2012-09-01 HISTORY — DX: Unspecified tear of unspecified meniscus, current injury, unspecified knee, initial encounter: S83.209A

## 2012-09-01 HISTORY — PX: KNEE ARTHROSCOPY WITH LATERAL MENISECTOMY: SHX6193

## 2012-09-01 LAB — POCT PREGNANCY, URINE: Preg Test, Ur: NEGATIVE

## 2012-09-01 SURGERY — ARTHROSCOPY, KNEE, WITH LATERAL MENISCECTOMY
Anesthesia: General | Site: Knee | Laterality: Left | Wound class: Clean

## 2012-09-01 MED ORDER — BUPIVACAINE-EPINEPHRINE 0.25% -1:200000 IJ SOLN
INTRAMUSCULAR | Status: DC | PRN
  Administered 2012-09-01: 20 mL

## 2012-09-01 MED ORDER — LIDOCAINE HCL (CARDIAC) 20 MG/ML IV SOLN
INTRAVENOUS | Status: DC | PRN
  Administered 2012-09-01: 75 mg via INTRAVENOUS

## 2012-09-01 MED ORDER — CHLORHEXIDINE GLUCONATE 4 % EX LIQD
60.0000 mL | Freq: Once | CUTANEOUS | Status: DC
  Filled 2012-09-01: qty 60

## 2012-09-01 MED ORDER — FENTANYL CITRATE 0.05 MG/ML IJ SOLN
25.0000 ug | INTRAMUSCULAR | Status: DC | PRN
  Administered 2012-09-01 (×2): 25 ug via INTRAVENOUS
  Administered 2012-09-01: 50 ug via INTRAVENOUS
  Filled 2012-09-01: qty 1

## 2012-09-01 MED ORDER — DEXAMETHASONE SODIUM PHOSPHATE 4 MG/ML IJ SOLN
INTRAMUSCULAR | Status: DC | PRN
  Administered 2012-09-01: 8 mg via INTRAVENOUS

## 2012-09-01 MED ORDER — SODIUM CHLORIDE 0.9 % IR SOLN
Status: DC | PRN
  Administered 2012-09-01: 6000 mL

## 2012-09-01 MED ORDER — LACTATED RINGERS IV SOLN
INTRAVENOUS | Status: DC
  Administered 2012-09-01 (×3): via INTRAVENOUS
  Filled 2012-09-01: qty 1000

## 2012-09-01 MED ORDER — MIDAZOLAM HCL 5 MG/5ML IJ SOLN
INTRAMUSCULAR | Status: DC | PRN
  Administered 2012-09-01 (×2): 1 mg via INTRAVENOUS

## 2012-09-01 MED ORDER — CEFAZOLIN SODIUM-DEXTROSE 2-3 GM-% IV SOLR
2.0000 g | INTRAVENOUS | Status: AC
  Administered 2012-09-01: 2 g via INTRAVENOUS
  Filled 2012-09-01: qty 50

## 2012-09-01 MED ORDER — FENTANYL CITRATE 0.05 MG/ML IJ SOLN
INTRAMUSCULAR | Status: DC | PRN
  Administered 2012-09-01: 25 ug via INTRAVENOUS
  Administered 2012-09-01: 50 ug via INTRAVENOUS
  Administered 2012-09-01 (×2): 25 ug via INTRAVENOUS
  Administered 2012-09-01: 50 ug via INTRAVENOUS
  Administered 2012-09-01: 25 ug via INTRAVENOUS

## 2012-09-01 MED ORDER — LACTATED RINGERS IV SOLN
INTRAVENOUS | Status: DC
  Filled 2012-09-01: qty 1000

## 2012-09-01 MED ORDER — OXYCODONE-ACETAMINOPHEN 10-325 MG PO TABS: 1.0000 | ORAL_TABLET | ORAL | Status: DC | PRN

## 2012-09-01 MED ORDER — BACITRACIN-NEOMYCIN-POLYMYXIN 400-5-5000 EX OINT
TOPICAL_OINTMENT | CUTANEOUS | Status: DC | PRN
  Administered 2012-09-01: 1 via TOPICAL

## 2012-09-01 MED ORDER — ONDANSETRON HCL 4 MG/2ML IJ SOLN
INTRAMUSCULAR | Status: DC | PRN
  Administered 2012-09-01: 4 mg via INTRAVENOUS

## 2012-09-01 MED ORDER — OXYCODONE-ACETAMINOPHEN 5-325 MG PO TABS
1.0000 | ORAL_TABLET | ORAL | Status: DC | PRN
  Administered 2012-09-01: 1 via ORAL
  Filled 2012-09-01: qty 1

## 2012-09-01 MED ORDER — PROPOFOL 10 MG/ML IV BOLUS
INTRAVENOUS | Status: DC | PRN
  Administered 2012-09-01: 250 mg via INTRAVENOUS

## 2012-09-01 SURGICAL SUPPLY — 43 items
BANDAGE ELASTIC 4 VELCRO ST LF (GAUZE/BANDAGES/DRESSINGS) ×2 IMPLANT
BANDAGE ELASTIC 6 VELCRO ST LF (GAUZE/BANDAGES/DRESSINGS) ×2 IMPLANT
BLADE CUDA 5.5 (BLADE) IMPLANT
BLADE CUTTER GATOR 3.5 (BLADE) IMPLANT
BLADE CUTTER MENIS 5.5 (BLADE) IMPLANT
BLADE GREAT WHITE 4.2 (BLADE) ×2 IMPLANT
BLADE SURG 15 STRL LF DISP TIS (BLADE) IMPLANT
BLADE SURG 15 STRL SS (BLADE)
BNDG COHESIVE 6X5 TAN NS LF (GAUZE/BANDAGES/DRESSINGS) ×2 IMPLANT
CANISTER SUCT LVC 12 LTR MEDI- (MISCELLANEOUS) ×3 IMPLANT
CANISTER SUCTION 2500CC (MISCELLANEOUS) ×2 IMPLANT
CLOTH BEACON ORANGE TIMEOUT ST (SAFETY) ×2 IMPLANT
DRAPE ARTHROSCOPY W/POUCH 114 (DRAPES) ×2 IMPLANT
DRAPE LG THREE QUARTER DISP (DRAPES) ×2 IMPLANT
DRSG EMULSION OIL 3X3 NADH (GAUZE/BANDAGES/DRESSINGS) ×2 IMPLANT
DRSG PAD ABDOMINAL 8X10 ST (GAUZE/BANDAGES/DRESSINGS) ×4 IMPLANT
DURAPREP 26ML APPLICATOR (WOUND CARE) ×2 IMPLANT
ELECT MENISCUS 165MM 90D (ELECTRODE) IMPLANT
ELECT REM PT RETURN 9FT ADLT (ELECTROSURGICAL)
ELECTRODE REM PT RTRN 9FT ADLT (ELECTROSURGICAL) IMPLANT
GAUZE SPONGE 4X4 12PLY STRL LF (GAUZE/BANDAGES/DRESSINGS) ×1 IMPLANT
GLOVE ECLIPSE 8.0 STRL XLNG CF (GLOVE) ×2 IMPLANT
GLOVE INDICATOR 8.5 STRL (GLOVE) ×4 IMPLANT
GLOVE SURG ORTHO 6.0 STRL STRW (GLOVE) ×2 IMPLANT
GOWN PREVENTION PLUS LG XLONG (DISPOSABLE) ×2 IMPLANT
GOWN STRL REIN XL XLG (GOWN DISPOSABLE) ×2 IMPLANT
IV NS IRRIG 3000ML ARTHROMATIC (IV SOLUTION) ×7 IMPLANT
KNEE WRAP E Z 3 GEL PACK (MISCELLANEOUS) ×2 IMPLANT
PACK ARTHROSCOPY DSU (CUSTOM PROCEDURE TRAY) ×2 IMPLANT
PACK BASIN DAY SURGERY FS (CUSTOM PROCEDURE TRAY) ×2 IMPLANT
PADDING CAST ABS 6INX4YD NS (CAST SUPPLIES) ×1
PADDING CAST ABS COTTON 6X4 NS (CAST SUPPLIES) IMPLANT
PADDING CAST COTTON 6X4 STRL (CAST SUPPLIES) ×2 IMPLANT
PENCIL BUTTON HOLSTER BLD 10FT (ELECTRODE) IMPLANT
SET ARTHROSCOPY TUBING (MISCELLANEOUS) ×2
SET ARTHROSCOPY TUBING LN (MISCELLANEOUS) ×1 IMPLANT
SPONGE GAUZE 4X4 12PLY (GAUZE/BANDAGES/DRESSINGS) ×2 IMPLANT
SPONGE SURGIFOAM ABS GEL 12-7 (HEMOSTASIS) IMPLANT
SUT ETHILON 3 0 PS 1 (SUTURE) ×2 IMPLANT
SYR CONTROL 10ML LL (SYRINGE) IMPLANT
TOWEL OR 17X24 6PK STRL BLUE (TOWEL DISPOSABLE) ×2 IMPLANT
WAND 90 DEG TURBOVAC W/CORD (SURGICAL WAND) ×3 IMPLANT
WATER STERILE IRR 500ML POUR (IV SOLUTION) ×2 IMPLANT

## 2012-09-01 NOTE — Anesthesia Preprocedure Evaluation (Addendum)
Anesthesia Evaluation  Patient identified by MRN, date of birth, ID band Patient awake    Reviewed: Allergy & Precautions, H&P , NPO status , Patient's Chart, lab work & pertinent test results  Airway Mallampati: II TM Distance: >3 FB Neck ROM: full    Dental  (+) Dental Advisory Given and Caps Some kind of cap on upper lateral incissors both sides:   Pulmonary neg pulmonary ROS,  breath sounds clear to auscultation  Pulmonary exam normal       Cardiovascular Exercise Tolerance: Good negative cardio ROS  Rhythm:regular Rate:Normal     Neuro/Psych negative neurological ROS  negative psych ROS   GI/Hepatic negative GI ROS, Neg liver ROS,   Endo/Other  negative endocrine ROS  Renal/GU negative Renal ROS  negative genitourinary   Musculoskeletal   Abdominal   Peds  Hematology negative hematology ROS (+)   Anesthesia Other Findings   Reproductive/Obstetrics negative OB ROS                          Anesthesia Physical Anesthesia Plan  ASA: I  Anesthesia Plan: General   Post-op Pain Management:    Induction: Intravenous  Airway Management Planned: LMA  Additional Equipment:   Intra-op Plan:   Post-operative Plan:   Informed Consent: I have reviewed the patients History and Physical, chart, labs and discussed the procedure including the risks, benefits and alternatives for the proposed anesthesia with the patient or authorized representative who has indicated his/her understanding and acceptance.   Dental Advisory Given  Plan Discussed with: CRNA and Surgeon  Anesthesia Plan Comments:         Anesthesia Quick Evaluation

## 2012-09-01 NOTE — Transfer of Care (Signed)
Immediate Anesthesia Transfer of Care Note  Patient: Theresa Ortiz  Procedure(s) Performed: Procedure(s) (LRB): LEFT KNEE ARTHROSCOPY WITH LATERAL MENISCECTOMY  (Left)  Patient Location: Patient transported to PACU with oxygen via face mask at 4 Liters / Min  Anesthesia Type: General  Level of Consciousness: awake and alert   Airway & Oxygen Therapy: Patient Spontanous Breathing and Patient connected to face mask oxygen  Post-op Assessment: Report given to PACU RN and Post -op Vital signs reviewed and stable  Post vital signs: Reviewed and stable  Dentition: Teeth and oropharynx remain in pre-op condition  Complications: No apparent anesthesia complications

## 2012-09-01 NOTE — Interval H&P Note (Signed)
History and Physical Interval Note:  09/01/2012 12:52 PM  Theresa Ortiz  has presented today for surgery, with the diagnosis of LEFT KNEE LATERAL MENISCAL TEAR   The various methods of treatment have been discussed with the patient and family. After consideration of risks, benefits and other options for treatment, the patient has consented to  Procedure(s): LEFT KNEE ARTHROSCOPY WITH LATERAL MENISCECTOMY POSSIBLE OPEN LATERAL RELEASE  (Left) as a surgical intervention .  The patient's history has been reviewed, patient examined, no change in status, stable for surgery.  I have reviewed the patient's chart and labs.  Questions were answered to the patient's satisfaction.     Payton Moder A

## 2012-09-01 NOTE — Anesthesia Procedure Notes (Signed)
Procedure Name: LMA Insertion Date/Time: 09/01/2012 12:58 PM Performed by: Fran Lowes Pre-anesthesia Checklist: Patient identified, Emergency Drugs available, Suction available and Patient being monitored Patient Re-evaluated:Patient Re-evaluated prior to inductionOxygen Delivery Method: Circle System Utilized Preoxygenation: Pre-oxygenation with 100% oxygen Intubation Type: IV induction Ventilation: Mask ventilation without difficulty LMA: LMA inserted LMA Size: 4.0 Number of attempts: 1 Airway Equipment and Method: bite block Placement Confirmation: positive ETCO2 Tube secured with: Tape Dental Injury: Teeth and Oropharynx as per pre-operative assessment

## 2012-09-01 NOTE — H&P (View-Only) (Signed)
NPO AFTER MN. ARRIVES AT 1030. NEEDS HG AND URINE PREG. 

## 2012-09-01 NOTE — Interval H&P Note (Signed)
History and Physical Interval Note:  09/01/2012 12:51 PM  Theresa Ortiz  has presented today for surgery, with the diagnosis of LEFT KNEE LATERAL MENISCAL TEAR   The various methods of treatment have been discussed with the patient and family. After consideration of risks, benefits and other options for treatment, the patient has consented to  Procedure(s): LEFT KNEE ARTHROSCOPY WITH LATERAL MENISCECTOMY POSSIBLE OPEN LATERAL RELEASE  (Left) as a surgical intervention .  The patient's history has been reviewed, patient examined, no change in status, stable for surgery.  I have reviewed the patient's chart and labs.  Questions were answered to the patient's satisfaction.     Joshus Rogan A

## 2012-09-01 NOTE — Anesthesia Postprocedure Evaluation (Signed)
Anesthesia Post Note  Patient: Theresa Ortiz  Procedure(s) Performed: Procedure(s) (LRB): LEFT KNEE ARTHROSCOPY WITH LATERAL MENISCECTOMY  (Left)  Anesthesia type: General  Patient location: PACU  Post pain: Pain level controlled  Post assessment: Post-op Vital signs reviewed  Last Vitals: BP 136/89  Pulse 64  Temp(Src) 36.4 C (Oral)  Resp 13  Ht 5\' 4"  (1.626 m)  Wt 233 lb 5 oz (105.83 kg)  BMI 40.03 kg/m2  SpO2 92%  Post vital signs: Reviewed  Level of consciousness: sedated  Complications: No apparent anesthesia complications

## 2012-09-01 NOTE — Brief Op Note (Signed)
09/01/2012  1:47 PM  PATIENT:  Theresa Ortiz  22 y.o. female  PRE-OPERATIVE DIAGNOSIS:  LEFT KNEE LATERAL MENISCAL TEAR   POST-OPERATIVE DIAGNOSIS:  LEFT KNEE LATERAL MENISCAL TEAR ,Very Complex Tear.  PROCEDURE:  Procedure(s): LEFT KNEE ARTHROSCOPY WITH LATERAL MENISCECTOMY  (Left)  SURGEON:  Surgeon(s) and Role:    * Jacki Cones, MD - Primary   ASSISTANTS: OR Tech   ANESTHESIA:   general  EBL:  Total I/O In: 1200 [I.V.:1200] Out: -   BLOOD ADMINISTERED:none  DRAINS: none   LOCAL MEDICATIONS USED:  MARCAINE  30cc  Of 0.25% with Epinephrine.   SPECIMEN:  No Specimen  DISPOSITION OF SPECIMEN:  No Specimen  COUNTS:  YES  TOURNIQUET:  * No tourniquets in log *  DICTATION: .Other Dictation: Dictation Number [id number]  PLAN OF CARE: Discharge to home after PACU  PATIENT DISPOSITION:  PACU - hemodynamically stable.   Delay start of Pharmacological VTE agent (>24hrs) due to surgical blood loss or risk of bleeding: yes

## 2012-09-02 ENCOUNTER — Encounter (HOSPITAL_BASED_OUTPATIENT_CLINIC_OR_DEPARTMENT_OTHER): Payer: Self-pay | Admitting: Orthopedic Surgery

## 2012-09-02 NOTE — Op Note (Signed)
[name], Theresa Ortiz            ACCOUNT NO.:  [id number]  MEDICAL RECORD NO.:  [id number]  LOCATION:                                 FACILITY:  PHYSICIAN:  Georges Lynch. Darrelyn Hillock, M.D.     DATE OF BIRTH:  DATE OF PROCEDURE:  09/01/2012 DATE OF DISCHARGE:                              OPERATIVE REPORT   SURGEON:  Georges Lynch. Romir Klimowicz, MD  ASSISTANT:  OR tech.  PREOPERATIVE DIAGNOSIS:  Acute bucket-handle tear of the lateral meniscus, left knee.  POSTOPERATIVE DIAGNOSIS:  Acute bucket-handle tear of the lateral meniscus, left knee.  OPERATION: 1. Diagnostic arthroscopy, left knee. 2. Excision of bucket-handle tear of the lateral meniscus, left knee,     which was extremely complex tear.  PROCEDURE:  Under general anesthesia, routine orthopedic prep and draping of left lower extremity was carried out.  The appropriate time- out was first carried out.  I also marked the appropriate left leg in the holding area.  At this time, a small punctate incision made in suprapatellar pouch.  The knee was distended with saline.  She also had 2 g of IV Ancef.  Following that another small punctate incision made in the anterolateral joint.  The arthroscope was entered from lateral approach and a complete diagnostic arthroscopy was carried out. Suprapatellar pouch was fine.  The medial side of the joint was completely normal.  Medial meniscus was intact.  ACL, PCL were intact. I went over the lateral joint.  She had a severe complex bucket-handle tear of the posterior portion of the lateral meniscus.  I probed the meniscus.  This is the only tear noted.  I then introduced a shaver suction device followed by the ArthroCare, did a partial lateral meniscectomy.  At the end of the procedure, we probed the meniscus again.  Remaining part of meniscus was intact.  Note, I tried to leave as much meniscus as I could because of her age.  She is 22 years old and I left as much normal meniscus as possible.   We thoroughly irrigated out the knee, removed all the fluid and closed all 3 punctate incisions with 3-0 nylon suture.  I injected 30 mL of 0.25% Marcaine with epinephrine in knee joint.  Sterile Neosporin dressings were applied.  Postop, she will be on aspirin 325 mg b.i.d. for 2 weeks.  She will also be on Percocet 10/250, 1 every 4 hours p.r.n. for pain.          ______________________________ Georges Lynch. Darrelyn Hillock, M.D.    RAG/MEDQ  D:  09/01/2012  T:  09/02/2012  Job:  [id number]

## 2012-09-03 LAB — POCT HEMOGLOBIN-HEMACUE: Hemoglobin: 13.5 g/dL (ref 12.0–15.0)

## 2012-09-15 ENCOUNTER — Ambulatory Visit: Payer: 59 | Attending: Orthopedic Surgery | Admitting: Physical Therapy

## 2012-09-15 DIAGNOSIS — IMO0001 Reserved for inherently not codable concepts without codable children: Secondary | ICD-10-CM | POA: Insufficient documentation

## 2012-09-15 DIAGNOSIS — M25569 Pain in unspecified knee: Secondary | ICD-10-CM | POA: Insufficient documentation

## 2012-09-15 DIAGNOSIS — R609 Edema, unspecified: Secondary | ICD-10-CM | POA: Insufficient documentation

## 2012-09-15 DIAGNOSIS — R262 Difficulty in walking, not elsewhere classified: Secondary | ICD-10-CM | POA: Insufficient documentation

## 2012-09-16 ENCOUNTER — Ambulatory Visit: Payer: 59 | Admitting: Physical Therapy

## 2012-09-29 ENCOUNTER — Encounter: Payer: Self-pay | Admitting: Physical Therapy

## 2012-09-30 ENCOUNTER — Encounter: Payer: Self-pay | Admitting: Physical Therapy

## 2012-10-06 ENCOUNTER — Encounter: Payer: Self-pay | Admitting: Physical Therapy

## 2012-10-08 ENCOUNTER — Encounter: Payer: Self-pay | Admitting: Physical Therapy

## 2012-11-18 DIAGNOSIS — R7301 Impaired fasting glucose: Secondary | ICD-10-CM | POA: Insufficient documentation

## 2013-06-14 ENCOUNTER — Ambulatory Visit: Payer: Self-pay

## 2013-06-14 ENCOUNTER — Other Ambulatory Visit: Payer: Self-pay | Admitting: Occupational Medicine

## 2013-06-14 DIAGNOSIS — R52 Pain, unspecified: Secondary | ICD-10-CM

## 2013-09-20 ENCOUNTER — Emergency Department (HOSPITAL_BASED_OUTPATIENT_CLINIC_OR_DEPARTMENT_OTHER): Payer: 59

## 2013-09-20 ENCOUNTER — Emergency Department (HOSPITAL_BASED_OUTPATIENT_CLINIC_OR_DEPARTMENT_OTHER)
Admission: EM | Admit: 2013-09-20 | Discharge: 2013-09-20 | Disposition: A | Discharge status: Home / Self Care | Payer: 59 | Attending: Emergency Medicine | Admitting: Emergency Medicine

## 2013-09-20 ENCOUNTER — Encounter (HOSPITAL_BASED_OUTPATIENT_CLINIC_OR_DEPARTMENT_OTHER): Payer: Self-pay | Admitting: Emergency Medicine

## 2013-09-20 DIAGNOSIS — Z87891 Personal history of nicotine dependence: Secondary | ICD-10-CM | POA: Insufficient documentation

## 2013-09-20 DIAGNOSIS — J159 Unspecified bacterial pneumonia: Secondary | ICD-10-CM | POA: Insufficient documentation

## 2013-09-20 DIAGNOSIS — Z792 Long term (current) use of antibiotics: Secondary | ICD-10-CM | POA: Insufficient documentation

## 2013-09-20 DIAGNOSIS — J189 Pneumonia, unspecified organism: Secondary | ICD-10-CM

## 2013-09-20 DIAGNOSIS — Z87828 Personal history of other (healed) physical injury and trauma: Secondary | ICD-10-CM | POA: Insufficient documentation

## 2013-09-20 LAB — RAPID STREP SCREEN (MED CTR MEBANE ONLY): Streptococcus, Group A Screen (Direct): NEGATIVE

## 2013-09-20 MED ORDER — ALBUTEROL SULFATE HFA 108 (90 BASE) MCG/ACT IN AERS
2.0000 | INHALATION_SPRAY | RESPIRATORY_TRACT | Status: DC | PRN
  Administered 2013-09-20: 2 via RESPIRATORY_TRACT
  Filled 2013-09-20: qty 6.7

## 2013-09-20 MED ORDER — ALBUTEROL SULFATE HFA 108 (90 BASE) MCG/ACT IN AERS: 1.0000 | INHALATION_SPRAY | Freq: Four times a day (QID) | RESPIRATORY_TRACT | Status: DC | PRN

## 2013-09-20 MED ORDER — HYDROCOD POLST-CHLORPHEN POLST 10-8 MG/5ML PO LQCR: 5.0000 mL | Freq: Two times a day (BID) | ORAL | Status: DC

## 2013-09-20 MED ORDER — LEVOFLOXACIN 500 MG PO TABS: 500.0000 mg | ORAL_TABLET | Freq: Every day | ORAL | Status: DC

## 2013-09-20 MED ORDER — LEVOFLOXACIN 500 MG PO TABS
500.0000 mg | ORAL_TABLET | Freq: Every day | ORAL | Status: DC
  Administered 2013-09-20: 500 mg via ORAL
  Filled 2013-09-20: qty 1

## 2013-09-20 NOTE — Discharge Instructions (Signed)
Pneumonia, Adult °Pneumonia is an infection of the lungs. It may be caused by a germ (virus or bacteria). Some types of pneumonia can spread easily from person to person. This can happen when you cough or sneeze. °HOME CARE °· Only take medicine as told by your doctor. °· Take your medicine (antibiotics) as told. Finish it even if you start to feel better. °· Do not smoke. °· You may use a vaporizer or humidifier in your room. This can help loosen thick spit (mucus). °· Sleep so you are almost sitting up (semi-upright). This helps reduce coughing. °· Rest. °A shot (vaccine) can help prevent pneumonia. Shots are often advised for: °· People over 65 years old. °· Patients on chemotherapy. °· People with long-term (chronic) lung problems. °· People with immune system problems. °GET HELP RIGHT AWAY IF:  °· You are getting worse. °· You cannot control your cough, and you are losing sleep. °· You cough up blood. °· Your pain gets worse, even with medicine. °· You have a fever. °· Any of your problems are getting worse, not better. °· You have shortness of breath or chest pain. °MAKE SURE YOU:  °· Understand these instructions. °· Will watch your condition. °· Will get help right away if you are not doing well or get worse. °Document Released: 10/02/2007 Document Revised: 07/08/2011 Document Reviewed: 07/06/2010 °[employer]® Patient Information ©2014 [employer]. ° °

## 2013-09-20 NOTE — ED Notes (Signed)
Chest congestion and sinus pressure x 3 days.

## 2013-09-20 NOTE — ED Provider Notes (Signed)
CSN: [id number]     Arrival date & time 09/20/13  1219 History   First MD Initiated Contact with Patient 09/20/13 1329     Chief Complaint  Patient presents with  . URI     HPI  Patient presents with complaint of cough. Started with runny nose and cold symptoms for 5 days ago. Has some sinus pressure for a few days and this is improved. She developed more of a cough. Feels fatigued with exertion. No shortness of breath at rest. Diffuse bodyaches. No shakes chills rigors but at times she "may have run fever". Has not checked temperature at home.  Past Medical History  Diagnosis Date  . Acute meniscal tear of knee     left knee   Past Surgical History  Procedure Laterality Date  . Right index  finger tendon repair  NOV 2013  . Knee arthroscopy with lateral menisectomy Left 09/01/2012    Procedure: LEFT KNEE ARTHROSCOPY WITH LATERAL MENISCECTOMY ;  Surgeon: Tobi Bastos, MD;  Location: [facility];  Service: Orthopedics;  Laterality: Left;   No family history on file. History  Substance Use Topics  . Smoking status: Former Smoker -- 0.25 packs/day for 3 years    Types: Cigarettes  . Smokeless tobacco: Never Used     Comment:    1 PP3D  . Alcohol Use: 1.5 oz/week    3 drink(s) per week     Comment: 2 times week   OB History   Grav Para Term Preterm Abortions TAB SAB Ect Mult Living                 Review of Systems  Constitutional: Negative for fever, chills, diaphoresis, appetite change and fatigue.  HENT: Positive for rhinorrhea and sinus pressure. Negative for mouth sores, sore throat and trouble swallowing.   Eyes: Negative for visual disturbance.  Respiratory: Positive for cough and shortness of breath. Negative for chest tightness and wheezing.   Cardiovascular: Negative for chest pain.  Gastrointestinal: Negative for nausea, vomiting, abdominal pain, diarrhea and abdominal distention.  Endocrine: Negative for polydipsia, polyphagia and polyuria.   Genitourinary: Negative for dysuria, frequency and hematuria.  Musculoskeletal: Negative for gait problem.  Skin: Negative for color change, pallor and rash.  Neurological: Negative for dizziness, syncope, light-headedness and headaches.  Hematological: Does not bruise/bleed easily.  Psychiatric/Behavioral: Negative for behavioral problems and confusion.      Allergies  Raspberry  Home Medications   Prior to Admission medications   Medication Sig Start Date End Date Taking? Authorizing Provider  albuterol (PROVENTIL HFA;VENTOLIN HFA) 108 (90 BASE) MCG/ACT inhaler Inhale 1-2 puffs into the lungs every 6 (six) hours as needed for wheezing. 09/20/13   Tanna Furry, MD  chlorpheniramine-HYDROcodone [facility] PENNKINETIC ER) 10-8 MG/5ML LQCR Take 5 mLs by mouth every 12 (twelve) hours. 09/20/13   Tanna Furry, MD  levofloxacin (LEVAQUIN) 500 MG tablet Take 1 tablet (500 mg total) by mouth daily. 09/20/13   Tanna Furry, MD  oxyCODONE-acetaminophen (PERCOCET) 10-325 MG per tablet Take 1 tablet by mouth every 4 (four) hours as needed for pain. 09/01/12   Tobi Bastos, MD   BP 124/74  Pulse 85  Temp(Src) 98.7 F (37.1 C) (Oral)  Resp 16  Ht 5\' 4"  (1.626 m)  Wt 230 lb 1.6 oz (104.373 kg)  BMI 39.48 kg/m2  SpO2 98%  LMP 08/16/2013 Physical Exam  Constitutional: She is oriented to person, place, and time. She appears well-developed and well-nourished. No distress.  HENT:  Head: Normocephalic.  Eyes: Conjunctivae are normal. Pupils are equal, round, and reactive to light. No scleral icterus.  Neck: Normal range of motion. Neck supple. No thyromegaly present.  Cardiovascular: Normal rate and regular rhythm.  Exam reveals no gallop and no friction rub.   No murmur heard. Pulmonary/Chest: Effort normal. No respiratory distress. She has no wheezes. She has rhonchi in the left middle field and the left lower field. She has no rales.  Diffuse rhonchi, most pronounced in Lt mid lung.  Wheezing with  cough.  Abdominal: Soft. Bowel sounds are normal. She exhibits no distension. There is no tenderness. There is no rebound.  Musculoskeletal: Normal range of motion.  Neurological: She is alert and oriented to person, place, and time.  Skin: Skin is warm and dry. No rash noted.  Psychiatric: She has a normal mood and affect. Her behavior is normal.    ED Course  Procedures (including critical care time) Labs Review Labs Reviewed  RAPID STREP SCREEN  CULTURE, GROUP A STREP    Imaging Review Dg Chest 2 View  09/20/2013   CLINICAL DATA:  Cough and chest congestion. Upper respiratory infection.  EXAM: CHEST  2 VIEW  COMPARISON:  04/07/2010  FINDINGS: There small patchy infiltrates in the left upper lobe and in the left midzone. Right lung is clear. No effusions. Heart size and vascularity are normal. Moderate thoracolumbar scoliosis.  IMPRESSION: Two areas of patchy infiltrate in the left lung consistent with pneumonia.   Electronically Signed   By: Rozetta Nunnery M.D.   On: 09/20/2013 13:34     EKG Interpretation None      MDM   Final diagnoses:  Community acquired pneumonia    Exam has some crackles. 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 Frequent cough. Chest x-ray does show subtle infiltrates in the left midlung. She's not hypoxemic. Worker breathing is normal. She is perfectly appropriate for outpatient treatment for community acquired pneumonia. Given by mouth Levaquin here. Prescription for albuterol. Was instructed in using albuterol inhaler here. Discharged with prescription for Levaquin, albuterol, and Tussionex.    Tanna Furry, MD 09/20/13 1410

## 2013-09-22 LAB — CULTURE, GROUP A STREP

## 2013-11-02 ENCOUNTER — Ambulatory Visit (INDEPENDENT_AMBULATORY_CARE_PROVIDER_SITE_OTHER): Payer: Commercial Managed Care - PPO | Admitting: Family Medicine

## 2013-11-02 VITALS — BP 112/78 | HR 75 | Temp 97.4°F | Resp 18 | Ht 65.25 in | Wt 236.0 lb

## 2013-11-02 DIAGNOSIS — Z0000 Encounter for general adult medical examination without abnormal findings: Secondary | ICD-10-CM

## 2013-11-02 DIAGNOSIS — R7303 Prediabetes: Secondary | ICD-10-CM

## 2013-11-02 DIAGNOSIS — Z111 Encounter for screening for respiratory tuberculosis: Secondary | ICD-10-CM

## 2013-11-02 DIAGNOSIS — E663 Overweight: Secondary | ICD-10-CM

## 2013-11-02 DIAGNOSIS — R7309 Other abnormal glucose: Secondary | ICD-10-CM

## 2013-11-02 LAB — POCT GLYCOSYLATED HEMOGLOBIN (HGB A1C): Hemoglobin A1C: 5.6

## 2013-11-02 LAB — GLUCOSE, POCT (MANUAL RESULT ENTRY): POC Glucose: 83 mg/dl (ref 70–99)

## 2013-11-02 NOTE — Progress Notes (Signed)
Subjective:    Patient ID: Theresa Ortiz, female    DOB: [birth year], 23 y.o.   MRN: [id number]  HPI Theresa Ortiz is a 23 y.o. 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  Health maintenance: Tdap: may have had in 2009 at dept of health. Last one listed in 2003.  HPV vaccination: Gardasil x 3 - unsure which year.  Meningitis vaccination: not sure if had.  UTD on MMR (s/p , Hep B x3.  Pap testing: last year. Followed by obgyn - next appt unknown.  STI screening: not sexually active currently - last STI testing last year nl, positive chlamydia about a year and a half. Plans on having tested at [facility]. 9 lifetime partners, but no new partners since last test.  Exercise: no regular exercise.  Dentist: last visit last week.  Depression screening: denies depression, denies anhedonia.  Alcohol: on weekends - 4 -9 drinks per night on 2 nights only.denies addiction or trouble DUI on April 9th, no others.  Plan on cutting back with going to school.  Diagnosed with prediabetes in 2014.  Reading of 7?  Has not had rechecked since. Weight about the same. No blurry vision, no frequency or increased thirst.   Negative PPD in past - 2 step required today.   S/p L lateral meniscectomy in 2014- Dr. Gladstone Lighter, then R lateral meniscus repair- June 2015. In PT, full function, ROM, working on strength. No locking/giving way.   S/p treatment of CAP in May of this year. Rare cough, improved. No recent fever or shortness of breath.   Hx of ADD - takes meds when in school only - generic Adderall. Not taking right now. Followed by Dr. Osborne Casco.   Loestrin for birth control - [facility], off of this now.  Forgets to take at times. Has hx of ovarian cysts - reason for OCP. Not currently sexually active.   Occasional soreness in back - scoliosis. Occasional otc nsaid.    R index finger tendon repair - 2013.  60% disabled in R finger. Some soreness and difficulty with bending it in.  Patient Active Problem List   Diagnosis Date Noted  . Acute lateral meniscus tear of left knee 09/01/2012   Past Medical History  Diagnosis Date  . Acute meniscal tear of knee     left knee  . Allergy   . Asthma   . Shingles   . Ovarian cyst    Past Surgical History  Procedure Laterality Date  . Right index  finger tendon repair  NOV 2013  . Knee arthroscopy with lateral menisectomy Left 09/01/2012    Procedure: LEFT KNEE ARTHROSCOPY WITH LATERAL MENISCECTOMY ;  Surgeon: Tobi Bastos, MD;  Location: [facility];  Service: Orthopedics;  Laterality: Left;   Allergies  Allergen Reactions  . Raspberry Swelling    TONGUE SWELLS   Prior to Admission medications   Medication Sig Start Date End Date Taking? Authorizing Provider  albuterol (PROVENTIL HFA;VENTOLIN HFA) 108 (90 BASE) MCG/ACT inhaler Inhale 1-2 puffs into the lungs every 6 (six) hours as needed for wheezing. 09/20/13   Tanna Furry, MD  chlorpheniramine-HYDROcodone [facility] PENNKINETIC ER) 10-8 MG/5ML LQCR Take 5 mLs by mouth every 12 (twelve) hours. 09/20/13   Tanna Furry, MD  levofloxacin (LEVAQUIN) 500 MG tablet Take 1 tablet (500 mg total) by mouth daily. 09/20/13   Elta Guadeloupe  Jeneen Rinks, MD   History   Social History  . Marital Status: Single    Spouse Name: N/A    Number of Children: N/A  . Years of Education: N/A   Occupational History  . Not on file.   Social History Main Topics  . Smoking status: Former Smoker -- 0.25 packs/day for 3 years    Types: Cigarettes  . Smokeless tobacco: Never Used     Comment:    1 PP3D  . Alcohol Use: 3.0 oz/week    6 drink(s) per week     Comment: 2 times week  . Drug Use: No  . Sexual Activity: Not on file   Other Topics Concern  . Not on file   Social History Narrative  . No narrative on file    Review of Systems  All other systems reviewed  and are negative.  13 point review of systems per patient health survey noted.  Negative other than as indicated on reviewed nursing note.      Objective:   Physical Exam  Vitals reviewed. Constitutional: She is oriented to person, place, and time. She appears well-developed and well-nourished.  Overweight/obese.   HENT:  Head: Normocephalic and atraumatic.  Right Ear: External ear normal.  Left Ear: External ear normal.  Mouth/Throat: Oropharynx is clear and moist.  Eyes: Conjunctivae are normal. Pupils are equal, round, and reactive to light.  Neck: Normal range of motion. Neck supple. No thyromegaly present.  Cardiovascular: Normal rate, regular rhythm, normal heart sounds and intact distal pulses.   No murmur heard. Pulmonary/Chest: Effort normal and breath sounds normal. No respiratory distress. She has no wheezes.  Abdominal: Soft. Bowel sounds are normal. There is no tenderness.  Musculoskeletal: Normal range of motion. She exhibits no edema and no tenderness.  Limited by approx 10 degrees on 2nd phalynx R 2nd phalynx at MCP.   Lymphadenopathy:    She has no cervical adenopathy.  Neurological: She is alert and oriented to person, place, and time.  Skin: Skin is warm and dry. No rash noted.  Psychiatric: She has a normal mood and affect. Her behavior is normal. Thought content normal.   Filed Vitals:   11/02/13 1536  BP: 112/78  Pulse: 75  Temp: 97.4 F (36.3 C)  TempSrc: Oral  Resp: 18  Height: 5' 5.25" (1.657 m)  Weight: 236 lb (107.049 kg)  SpO2: 98%  Body mass index is 38.99 kg/(m^2).   Visual Acuity Screening   Right eye Left eye Both eyes  Without correction:     With correction: _0   Results for orders placed in visit on 11/02/13  GLUCOSE, POCT (MANUAL RESULT ENTRY)      Result Value Ref Range   POC Glucose 83  70 - 99 mg/dl  POCT GLYCOSYLATED HEMOGLOBIN (HGB A1C)      Result Value Ref Range   Hemoglobin A1C 5.6          Assessment  & Plan:  Theresa Ortiz is a 23 y.o. female Annual physical exam - Plan: POCT glucose (manual entry), POCT glycosylated hemoglobin (Hb A1C), TB Skin Test   --anticipatory guidance as below in AVS, screening labs above. Health maintenance items as above in HPI discussed/recommended as applicable.   - concerns discussed with binge drinking - plans on cutting back.   - follow up with PCP and PBGYN for ADD meds and STI testing +/- pap respectively.   -forms completed  - check with [facility] to determin  last tdap - if not given in 2009 - needs this updated, and meningitis vaccine also recommended if not yet received.   PPD screening test - Plan: TB Skin Test  - 2 step.  Will rtc in 48-72hr, then 1 week to 10 days for 2nd step.   Overweight/Prediabetes - Plan: POCT glucose (manual entry), POCT glycosylated hemoglobin (Hb A1C)  - now under level of prediabetes, but still obese/overweight and discussed exercise and diet necessity with BMI 38. Can refer to nutrition if she would like, but discussed liquid calories, alcohol avoidance, incr activity, and pool based exercise d/t pain in knees.   - can follow up with PCP re: this as well.    No orders of the defined types were placed in this encounter.   Patient Instructions  Check into tdap/tetanus and meningitis vaccines.  If you did not have these at health dept, you would need updated tdap, and recommend meningitis vaccine.  Exercise as discussed - pool based ok. 150 minutes per week, and decrease alcohol use as discussed. Let me know if you have difficulty in cutting back, and also if you would like to meet with nutritionist to help with weight loss.  Return in 48-72 hours for reading of tb skin test, and then can place other tb skin test in week to 10 days from now for 2nd one to be placed.  Keep follow up with OBGYN for STI testing and necessary follow up.  Blood sugar at borderline of "prediabetes".  Exercise, diet changes can continue to help  this. Recommend rechecking test in next 6 months to 1 year.   Keeping You Healthy  Get These Tests 1. Blood Pressure- Have your blood pressure checked once a year by your health care provider.  Normal blood pressure is 120/80. 2. Weight- Have your body mass index (BMI) calculated to screen for obesity.  BMI is measure of body fat based on height and weight.  You can also calculate your own BMI at GravelBags.it. 3. Cholesterol- Have your cholesterol checked every 5 years starting at age 52 then yearly starting at age 80. 22. Chlamydia, HIV, and other sexually transmitted diseases- Get screened every year until age 33, then within three months of each new sexual provider. 5. Pap Smear- Every 1-3 years; discuss with your health care provider. 6. Mammogram- Every year starting at age 51  Take these medicines  Calcium with Vitamin D-Your body needs 1200 mg of Calcium each day and [phone] IU of Vitamin D daily.  Your body can only absorb 500 mg of Calcium at a time so Calcium must be taken in 2 or 3 divided doses throughout the day.  Multivitamin with folic acid- Once daily if it is possible for you to become pregnant.  Get these Immunizations  Gardasil-Series of three doses; prevents HPV related illness such as genital warts and cervical cancer.  Menactra-Single dose; prevents meningitis.  Tetanus shot- Every 10 years.  Flu shot-Every year.  Take these steps 1. Do not smoke-Your healthcare provider can help you quit.  For tips on how to quit go to www.smokefree.gov or call 1-800 QUITNOW. 2. Be physically active- Exercise 5 days a week for at least 30 minutes.  If you are not already physically active, start slow and gradually work up to 30 minutes of moderate physical activity.  Examples of moderate activity include walking briskly, dancing, swimming, bicycling, etc. 3. Breast Cancer- A self breast exam every month is important for early detection of breast cancer.  For more  information and instruction on self breast exams, ask your healthcare provider or https://www.patel.info/. 4. Eat a healthy diet- Eat a variety of healthy foods such as fruits, vegetables, whole grains, low fat milk, low fat cheeses, yogurt, lean meats, poultry and fish, beans, nuts, tofu, etc.  For more information go to www. Thenutritionsource.org 5. Drink alcohol in moderation- Limit alcohol intake to one drink or less per day. Never drink and drive. 6. Depression- Your emotional health is as important as your physical health.  If you're feeling down or losing interest in things you normally enjoy please talk to your healthcare provider about being screened for depression. 7. Dental visit- Brush and floss your teeth twice daily; visit your dentist twice a year. 8. Eye doctor- Get an eye exam at least every 2 years. 9. Helmet use- Always wear a helmet when riding a bicycle, motorcycle, rollerblading or skateboarding. 55. Safe sex- If you may be exposed to sexually transmitted infections, use a condom. 11. Seat belts- Seat belts can save your live; always wear one. 12. Smoke/Carbon Monoxide detectors- These detectors need to be installed on the appropriate level of your home. Replace batteries at least once a year. 13. Skin cancer- When out in the sun please cover up and use sunscreen 15 SPF or higher. 14. Violence- If anyone is threatening or hurting you, please tell your healthcare provider.

## 2013-11-02 NOTE — Patient Instructions (Addendum)
Check into tdap/tetanus and meningitis vaccines.  If you did not have these at health dept, you would need updated tdap, and recommend meningitis vaccine.  Exercise as discussed - pool based ok. 150 minutes per week, and decrease alcohol use as discussed. Let me know if you have difficulty in cutting back, and also if you would like to meet with nutritionist to help with weight loss.  Return in 48-72 hours for reading of tb skin test, and then can place other tb skin test in week to 10 days from now for 2nd one to be placed.  Keep follow up with OBGYN for STI testing and necessary follow up.  Blood sugar at borderline of "prediabetes".  Exercise, diet changes can continue to help this. Recommend rechecking test in next 6 months to 1 year.   Keeping You Healthy  Get These Tests 1. Blood Pressure- Have your blood pressure checked once a year by your health care provider.  Normal blood pressure is 120/80. 2. Weight- Have your body mass index (BMI) calculated to screen for obesity.  BMI is measure of body fat based on height and weight.  You can also calculate your own BMI at [employer]velBags[employer]it. 3. Cholesterol- Have your cholesterol checked every 5 years starting at age 66 then yearly starting at age 53. 13. Chlamydia, HIV, and other sexually transmitted diseases- Get screened every year until age 91, then within three months of each new sexual provider. 5. Pap Smear- Every 1-3 years; discuss with your health care provider. 6. Mammogram- Every year starting at age 55  Take these medicines  Calcium with Vitamin D-Your body needs 1200 mg of Calcium each day and [phone] IU of Vitamin D daily.  Your body can only absorb 500 mg of Calcium at a time so Calcium must be taken in 2 or 3 divided doses throughout the day.  Multivitamin with folic acid- Once daily if it is possible for you to become pregnant.  Get these Immunizations  Gardasil-Series of three doses; prevents HPV related illness such  as genital warts and cervical cancer.  Menactra-Single dose; prevents meningitis.  Tetanus shot- Every 10 years.  Flu shot-Every year.  Take these steps 1. Do not smoke-Your healthcare provider can help you quit.  For tips on how to quit go to www.smokefree.gov or call 1-800 QUITNOW. 2. Be physically active- Exercise 5 days a week for at least 30 minutes.  If you are not already physically active, start slow and gradually work up to 30 minutes of moderate physical activity.  Examples of moderate activity include walking briskly, dancing, swimming, bicycling, etc. 3. Breast Cancer- A self breast exam every month is important for early detection of breast cancer.  For more information and instruction on self breast exams, ask your healthcare provider or https://www.patel.info/. 4. Eat a healthy diet- Eat a variety of healthy foods such as fruits, vegetables, whole grains, low fat milk, low fat cheeses, yogurt, lean meats, poultry and fish, beans, nuts, tofu, etc.  For more information go to www. Thenutritionsource.org 5. Drink alcohol in moderation- Limit alcohol intake to one drink or less per day. Never drink and drive. 6. Depression- Your emotional health is as important as your physical health.  If you're feeling down or losing interest in things you normally enjoy please talk to your healthcare provider about being screened for depression. 7. Dental visit- Brush and floss your teeth twice daily; visit your dentist twice a year. 8. Eye doctor- Get an eye exam at least  every 2 years. 9. Helmet use- Always wear a helmet when riding a bicycle, motorcycle, rollerblading or skateboarding. 29. Safe sex- If you may be exposed to sexually transmitted infections, use a condom. 11. Seat belts- Seat belts can save your live; always wear one. 12. Smoke/Carbon Monoxide detectors- These detectors need to be installed on the appropriate level of your home. Replace batteries at least once  a year. 13. Skin cancer- When out in the sun please cover up and use sunscreen 15 SPF or higher. 14. Violence- If anyone is threatening or hurting you, please tell your healthcare provider.

## 2013-11-03 NOTE — Progress Notes (Signed)
  Tuberculosis Risk Questionnaire  1. No Were you born outside the [place] in one of the following parts of the world: [place], [place], [place], [place] or [place]?    2. No Have you traveled outside the [place] and lived for more than one month in one of the following parts of the world: [place], [place], [place], [place] or [place]?    3. No Do you have a compromised immune system such as from any of the following conditions:HIV/AIDS, organ or bone marrow transplantation, diabetes, immunosuppressive medicines (e.g. Prednisone, Remicaide), leukemia, lymphoma, cancer of the head or neck, gastrectomy or jejunal bypass, end-stage renal disease (on dialysis), or silicosis?     4. Yes - [place] Have you ever or do you plan on working in: a residential care center, a health care facility, a jail or prison or homeless shelter?    5. No Have you ever: injected illegal drugs, used crack cocaine, lived in a homeless shelter  or been in jail or prison?     6. No Have you ever been exposed to anyone with infectious tuberculosis?    Tuberculosis Symptom Questionnaire  Do you currently have any of the following symptoms?  1. No Unexplained cough lasting more than 3 weeks?   2. No Unexplained fever lasting more than 3 weeks.   3. No Night Sweats (sweating that leaves the bedclothes and sheets wet)     4. No Shortness of Breath   5. No Chest Pain   6. No Unintentional weight loss    7. No Unexplained fatigue (very tired for no reason)

## 2013-11-04 ENCOUNTER — Ambulatory Visit (INDEPENDENT_AMBULATORY_CARE_PROVIDER_SITE_OTHER): Payer: Commercial Managed Care - PPO

## 2013-11-04 DIAGNOSIS — Z111 Encounter for screening for respiratory tuberculosis: Secondary | ICD-10-CM

## 2013-11-04 LAB — TB SKIN TEST
Induration: 0 mm
TB Skin Test: NEGATIVE

## 2013-11-04 NOTE — Progress Notes (Signed)
   Subjective:    Patient ID: Theresa Ortiz, female    DOB: October 08, 1990, 23 y.o.   MRN: [id number]  HPI Patient comes in today for PPD reading.  PPD was placed on left forearm and read today.  It was negative with 66mm   Review of Systems     Objective:   Physical Exam        Assessment & Plan:

## 2013-11-14 ENCOUNTER — Ambulatory Visit (INDEPENDENT_AMBULATORY_CARE_PROVIDER_SITE_OTHER): Payer: Commercial Managed Care - PPO | Admitting: Family Medicine

## 2013-11-14 VITALS — BP 106/70 | HR 95 | Temp 97.7°F | Resp 20 | Ht 65.0 in | Wt 241.1 lb

## 2013-11-14 DIAGNOSIS — Z7689 Persons encountering health services in other specified circumstances: Secondary | ICD-10-CM

## 2013-11-14 DIAGNOSIS — Z5189 Encounter for other specified aftercare: Secondary | ICD-10-CM

## 2013-11-14 DIAGNOSIS — IMO0002 Reserved for concepts with insufficient information to code with codable children: Secondary | ICD-10-CM

## 2013-11-14 DIAGNOSIS — Z23 Encounter for immunization: Secondary | ICD-10-CM

## 2013-11-14 DIAGNOSIS — Z0000 Encounter for general adult medical examination without abnormal findings: Secondary | ICD-10-CM

## 2013-11-14 DIAGNOSIS — S83206D Unspecified tear of unspecified meniscus, current injury, right knee, subsequent encounter: Secondary | ICD-10-CM

## 2013-11-14 NOTE — Patient Instructions (Signed)
You should receive a call or letter about your lab results within the next week to 10 days.  We can provide a copy of your varicella titer result then if needed.  Let me know if any other questions.

## 2013-11-14 NOTE — Progress Notes (Signed)
Subjective:   This chart was scribed for Merri Ray, MD by Forrestine Him, [facility] Scribe. This patient was seen in room 9 and the patient's care was started 3:46 PM.    Patient ID: Theresa Ortiz, female    DOB: [birth year], 23 y.o.   MRN: [id number]  Chief Complaint  Patient presents with  . Immunizations    Tdap/Varicella Titer    HPI  HPI Comments: Theresa Ortiz is a 23 y.o. Female with a PMHx of Asthma who presents to [facility] for immunization update today. Pt is requesting Tdap and Varicella titer. Last Tday 2003. Pt was just seen about 2 weeks ago for CPE. 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  Pt recent had knee arthroscopy with lateral menisectomy 6/16 performed by Dr. Theda Sers. States she does not currently have any restrictions at this time. She is requesting a signature today in order for her to be cleared for clinicals. Pt states her mobility is stable at this time, but she admits to some weakness. She has no other pertinent past medical history. No other concerns this visit.   Patient Active Problem List   Diagnosis Date Noted  . Acute lateral meniscus tear of left knee 09/01/2012   Past Medical History  Diagnosis Date  . Acute meniscal tear of knee     left knee  . Allergy   . Asthma   . Shingles   . Ovarian cyst    Past Surgical History  Procedure Laterality Date  . Right index  finger tendon repair  NOV 2013  . Knee arthroscopy with lateral menisectomy Left 09/01/2012    Procedure: LEFT KNEE ARTHROSCOPY WITH LATERAL MENISCECTOMY ;  Surgeon: Tobi Bastos, MD;  Location: [facility];  Service: Orthopedics;  Laterality: Left;   Allergies  Allergen Reactions  . Raspberry Swelling    TONGUE SWELLS   Prior to Admission medications   Medication Sig Start Date End Date Taking? Authorizing Provider  albuterol (PROVENTIL HFA;VENTOLIN HFA) 108 (90 BASE) MCG/ACT inhaler Inhale 1-2 puffs  into the lungs every 6 (six) hours as needed for wheezing. 09/20/13  Yes Tanna Furry, MD   History   Social History  . Marital Status: Single    Spouse Name: N/A    Number of Children: N/A  . Years of Education: N/A   Occupational History  . Not on file.   Social History Main Topics  . Smoking status: Former Smoker -- 0.25 packs/day for 3 years    Types: Cigarettes  . Smokeless tobacco: Never Used     Comment:    1 PP3D  . Alcohol Use: 3.0 oz/week    6 drink(s) per week     Comment: 2 times week  . Drug Use: No  . Sexual Activity: Not on file   Other Topics Concern  . Not on file   Social History Narrative  . No narrative on file     Review of Systems  Constitutional: Negative for fever and chills.  Respiratory: Negative for cough and shortness of breath.   Skin: Negative for rash.  Psychiatric/Behavioral: Negative for confusion.     Objective:  Physical Exam  Nursing note and vitals reviewed. Constitutional: She is oriented to person, place, and time. She appears well-developed and well-nourished.  HENT:  Head: Normocephalic.  Eyes: EOM are normal.  Neck: Normal range of motion.  Pulmonary/Chest: Effort normal.  Abdominal: She exhibits no distension.  Musculoskeletal: Normal range of motion.  Full flexion and extension of R knee Trocar sites are well healed at this time  Neurological: She is alert and oriented to person, place, and time.  Psychiatric: She has a normal mood and affect.    Filed Vitals:   11/14/13 1444  BP: 106/70  Pulse: 95  Temp: 97.7 F (36.5 C)  TempSrc: Oral  Resp: 20  Height: 5\' 5"  (1.651 m)  Weight: 241 lb 2 oz (109.374 kg)  SpO2: 98%     Assessment & Plan:   Theresa Ortiz is a 23 y.o. female Need for prophylactic vaccination with combined diphtheria-tetanus-pertussis (DTP) vaccine - Plan: Tdap vaccine greater than or equal to 7yo IM  - tdap updated, given.   Immunizations reviewed and up to date, after tdap given  today. will clarify Varicella with titer.    Right knee meniscal tear, subsequent encounter  - see prior CPE.   No apparent concerns on knee exam here, but will need clearance letter from ortho.    No orders of the defined types were placed in this encounter.   Patient Instructions  You should receive a call or letter about your lab results within the next week to 10 days.  We can provide a copy of your varicella titer result then if needed.  Let me know if any other questions.       I personally performed the services described in this documentation, which was scribed in my presence. The recorded information has been reviewed and is accurate.

## 2013-11-16 LAB — VARICELLA ZOSTER ANTIBODY, IGG: Varicella IgG: >4000 Index — ABNORMAL HIGH (ref <135.00)

## 2013-11-18 ENCOUNTER — Ambulatory Visit: Payer: Commercial Managed Care - PPO | Admitting: Family Medicine

## 2013-11-18 DIAGNOSIS — Z111 Encounter for screening for respiratory tuberculosis: Secondary | ICD-10-CM

## 2013-11-18 NOTE — Progress Notes (Unsigned)
   Subjective:    Patient ID: Theresa Ortiz, female    DOB: [birth year], 23 y.o.   MRN: [id number]  HPI  Tuberculosis Risk Questionnaire  1. {EXAM; YES/NO:19492::"No"} Were you born outside the [place] in one of the following parts of the world: [place], [place], [place], [place] or [place]?    2. {EXAM; YES/NO:19492::"No"} Have you traveled outside the [place] and lived for more than one month in one of the following parts of the world: [place], [place], [place], [place] or [place]?    3. {EXAM; YES/NO:19492::"No"} Do you have a compromised immune system such as from any of the following conditions:HIV/AIDS, organ or bone marrow transplantation, diabetes, immunosuppressive medicines (e.g. Prednisone, Remicaide), leukemia, lymphoma, cancer of the head or neck, gastrectomy or jejunal bypass, end-stage renal disease (on dialysis), or silicosis?     4. {EXAM; YES/NO:19492::"No"} Have you ever or do you plan on working in: a residential care center, a health care facility, a jail or prison or homeless shelter?    5. {EXAM; YES/NO:19492::"No"} Have you ever: injected illegal drugs, used crack cocaine, lived in a homeless shelter  or been in jail or prison?     6. {EXAM; YES/NO:19492::"No"} Have you ever been exposed to anyone with infectious tuberculosis?    Tuberculosis Symptom Questionnaire  Do you currently have any of the following symptoms?  1. {EXAM; YES/NO:19492::"No"} Unexplained cough lasting more than 3 weeks?   2. {EXAM; YES/NO:19492::"No"} Unexplained fever lasting more than 3 weeks.   3. {EXAM; YES/NO:19492::"No"} Night Sweats (sweating that leaves the bedclothes and sheets wet)     4. {EXAM; YES/NO:19492::"No"} Shortness of Breath   5. {EXAM; YES/NO:19492::"No"} Chest Pain   6. {EXAM; YES/NO:19492::"No"} Unintentional weight loss    7. {EXAM; YES/NO:19492::"No"} Unexplained fatigue (very tired for no reason)     Review of  Systems     Objective:   Physical Exam        Assessment & Plan:

## 2013-11-20 ENCOUNTER — Ambulatory Visit (INDEPENDENT_AMBULATORY_CARE_PROVIDER_SITE_OTHER): Payer: Commercial Managed Care - PPO | Admitting: Family Medicine

## 2013-11-20 DIAGNOSIS — Z111 Encounter for screening for respiratory tuberculosis: Secondary | ICD-10-CM

## 2013-11-20 DIAGNOSIS — Z09 Encounter for follow-up examination after completed treatment for conditions other than malignant neoplasm: Secondary | ICD-10-CM

## 2013-11-20 LAB — TB SKIN TEST
Induration: 0 mm
TB Skin Test: NEGATIVE

## 2013-11-20 NOTE — Progress Notes (Signed)
   Subjective:    Patient ID: Theresa Ortiz, female    DOB: [birth year], 23 y.o.   MRN: [id number]  HPI PPD Reading.  Placed 11/18/2013 at 11:11am left forearm.    Review of Systems     Objective:   Physical Exam PPD Negative, 0 mm induration.       Assessment & Plan:  Within 48-72 hour window PPD Negative, 0 mm induration.

## 2014-01-16 ENCOUNTER — Encounter (HOSPITAL_COMMUNITY): Payer: Self-pay | Admitting: Emergency Medicine

## 2014-01-16 ENCOUNTER — Emergency Department (HOSPITAL_COMMUNITY)
Admission: EM | Admit: 2014-01-16 | Discharge: 2014-01-16 | Disposition: A | Discharge status: Home / Self Care | Payer: 59 | Source: Home / Self Care

## 2014-01-16 ENCOUNTER — Emergency Department (INDEPENDENT_AMBULATORY_CARE_PROVIDER_SITE_OTHER): Payer: 59

## 2014-01-16 DIAGNOSIS — S93409A Sprain of unspecified ligament of unspecified ankle, initial encounter: Secondary | ICD-10-CM

## 2014-01-16 DIAGNOSIS — S93401A Sprain of unspecified ligament of right ankle, initial encounter: Secondary | ICD-10-CM

## 2014-01-16 DIAGNOSIS — Y9367 Activity, basketball: Secondary | ICD-10-CM

## 2014-01-16 NOTE — ED Notes (Signed)
Patient declined ASO, as it is the same as the one she brought in to clinic

## 2014-01-16 NOTE — ED Notes (Signed)
States she was in a baseball game yesterday PM, when she did not wear her customary ankle support for her already weak ankle, stepped funny , and injured right ankle.  C/o pain same. Used ice and elevation w minimal relief ; concerned about poss sprain vs fracture. NAD

## 2014-01-16 NOTE — Discharge Instructions (Signed)

## 2014-01-16 NOTE — ED Provider Notes (Signed)
CSN: [id number]     Arrival date & time 01/16/14  1219 History   First MD Initiated Contact with Patient 01/16/14 1325     Chief Complaint  Patient presents with  . Ankle Injury   (Consider location/radiation/quality/duration/timing/severity/associated sxs/prior Treatment) HPI Comments: Playng basketball yesterday and injured right ankle. C/O pain esp with wt bearing.   Past Medical History  Diagnosis Date  . Acute meniscal tear of knee     left knee  . Allergy   . Asthma   . Shingles   . Ovarian cyst    Past Surgical History  Procedure Laterality Date  . Right index  finger tendon repair  NOV 2013  . Knee arthroscopy with lateral menisectomy Left 09/01/2012    Procedure: LEFT KNEE ARTHROSCOPY WITH LATERAL MENISCECTOMY ;  Surgeon: Tobi Bastos, MD;  Location: [place];  Service: Orthopedics;  Laterality: Left;   Family History  Problem Relation Age of Onset  . Hyperlipidemia Mother   . Diabetes Father   . Hyperlipidemia Maternal Grandmother   . Hyperlipidemia Maternal Grandfather   . Cancer Maternal Grandfather   . Hyperlipidemia Paternal Grandmother   . Heart disease Paternal Grandmother    History  Substance Use Topics  . Smoking status: Current Every Day Smoker -- 1.00 packs/day for 3 years    Types: Cigarettes  . Smokeless tobacco: Never Used     Comment:    1 PP3D  . Alcohol Use: 3.0 oz/week    6 drink(s) per week     Comment: 2 times week   OB History   Grav Para Term Preterm Abortions TAB SAB Ect Mult Living                 Review of Systems  Constitutional: Negative for fever, chills and activity change.  HENT: Negative.   Respiratory: Negative for shortness of breath.   Musculoskeletal:       As per HPI  Skin: Negative for color change and pallor.  Neurological: Negative.     Allergies  Raspberry  Home Medications   Prior to Admission medications   Medication Sig Start Date End Date Taking? Authorizing Provider   amphetamine-dextroamphetamine (ADDERALL) 10 MG tablet Take 10 mg by mouth daily with breakfast.   Yes Historical Provider, MD  albuterol (PROVENTIL HFA;VENTOLIN HFA) 108 (90 BASE) MCG/ACT inhaler Inhale 1-2 puffs into the lungs every 6 (six) hours as needed for wheezing. 09/20/13   Tanna Furry, MD   BP 123/81  Pulse 97  Temp(Src) 98 F (36.7 C) (Oral)  Resp 20  SpO2 97% Physical Exam  Nursing note and vitals reviewed. Constitutional: She is oriented to person, place, and time. She appears well-developed and well-nourished. No distress.  HENT:  Head: Normocephalic and atraumatic.  Eyes: EOM are normal.  Neck: Normal range of motion. Neck supple.  Pulmonary/Chest: Effort normal. No respiratory distress.  Musculoskeletal: Normal range of motion.  Mild lateral malleolus edema and tenderness.  Tenderness medial ankle .  No deformity, Distal N/V and M/S intact. Pedal pulse 2+  Neurological: She is alert and oriented to person, place, and time. No cranial nerve deficit.  Skin: Skin is warm and dry.    ED Course  Procedures (including critical care time) Labs Review Labs Reviewed - No data to display  Imaging Review Dg Ankle Complete Right  01/16/2014   CLINICAL DATA:  Right ankle injury yesterday playing basketball ; persistent bimalleolar pain and swelling ; initial and counter  EXAM:  RIGHT ANKLE - COMPLETE 3+ VIEW  COMPARISON:  None.  FINDINGS: There is mild diffuse soft tissue swelling especially laterally. The ankle joint mortise is preserved. The talar dome is intact. There is no acute malleolar fracture. The talus and calcaneus are intact. There is a plantar calcaneal spur.  IMPRESSION: There is mild diffuse soft tissue swelling. No acute fracture nor dislocation is demonstrated.   Electronically Signed   By: Adine Heimann  [place]   On: 01/16/2014 13:20     MDM   1. Ankle sprain, right, initial encounter     ASO splint RICE Limit wt bearing as much as possible. F/U with ortho as  needed    Janne Napoleon, NP 01/16/14 1339

## 2014-01-18 NOTE — ED Provider Notes (Signed)
Medical screening examination/treatment/procedure(s) were performed by a resident physician or non-physician practitioner and as the supervising physician I was immediately available for consultation/collaboration.  Lynne Leader, MD    Gregor Hams, MD 01/18/14 [phone]

## 2014-02-25 ENCOUNTER — Ambulatory Visit (INDEPENDENT_AMBULATORY_CARE_PROVIDER_SITE_OTHER): Payer: Commercial Managed Care - PPO | Admitting: Family Medicine

## 2014-02-25 VITALS — BP 126/80 | HR 79 | Temp 98.1°F | Resp 18 | Ht 66.0 in | Wt 232.0 lb

## 2014-02-25 DIAGNOSIS — S92911D Unspecified fracture of right toe(s), subsequent encounter for fracture with routine healing: Secondary | ICD-10-CM

## 2014-02-25 DIAGNOSIS — R059 Cough, unspecified: Secondary | ICD-10-CM

## 2014-02-25 DIAGNOSIS — Z23 Encounter for immunization: Secondary | ICD-10-CM

## 2014-02-25 DIAGNOSIS — H109 Unspecified conjunctivitis: Secondary | ICD-10-CM

## 2014-02-25 DIAGNOSIS — J42 Unspecified chronic bronchitis: Secondary | ICD-10-CM

## 2014-02-25 DIAGNOSIS — R05 Cough: Secondary | ICD-10-CM

## 2014-02-25 MED ORDER — TOBRAMYCIN 0.3 % OP SOLN: 1.0000 [drp] | OPHTHALMIC | Status: DC

## 2014-02-25 MED ORDER — AMOXICILLIN 875 MG PO TABS: 875.0000 mg | ORAL_TABLET | Freq: Two times a day (BID) | ORAL | Status: DC

## 2014-02-25 NOTE — Progress Notes (Signed)
   Subjective:    Patient ID: Theresa Ortiz, female    DOB: March 14, 1991, 23 y.o.   MRN: [id number] This chart was scribed for Robyn Haber, MD by Zola Button, Medical Scribe. This patient was seen in Room 3 and the patient's care was started at 11:16 AM.   HPI HPI Comments: Theresa Ortiz is a 23 y.o. female who presents to the [facility] complaining of cough for the past few months starting early August of this year. Patient had PNA in May of this year. She woke up with congestion and rhinorrhea this morning. She also notes eye discharge and wheezing. Patient has allergies to raspberries, but NKDA.   Patient broke a toe on her right foot several weeks ago, noting toe pain and swelling. She jammed it recently, and has trouble bending it. She would like to make sure that her toe is healing properly and straight.  She would like her flu shot today.  Patient has just started XR school currently.    Review of Systems  Constitutional: Negative for appetite change.  HENT: Positive for congestion.   Eyes: Positive for discharge, redness and itching.  Respiratory: Positive for cough and wheezing.   Cardiovascular: Negative for chest pain.  Gastrointestinal: Negative for abdominal pain.  Genitourinary: Negative for hematuria.  Musculoskeletal: Positive for arthralgias and joint swelling.  Skin: Negative for rash.  Neurological: Negative for seizures.  Psychiatric/Behavioral: Negative for hallucinations.       Objective:   Physical Exam CONSTITUTIONAL: Well developed/well nourished HEAD: Normocephalic/atraumatic EYES: EOM/PERRL. Eyes slightly injected with exudates in corners of her eyes. Nasal passages swollen. ENMT: Mucous membranes moist NECK: supple no meningeal signs SPINE: entire spine nontender CV: S1/S2 noted, no murmurs/rubs/gallops noted LUNGS: Few rhonchi. ABDOMEN: soft, nontender, no rebound or guarding GU: no cva tenderness NEURO: Pt is  awake/alert, moves all extremitiesx4 EXTREMITIES: pulses normal. Toe is swollen and red, but in the normal location. SKIN: warm, color normal PSYCH: no abnormalities of mood noted        Assessment & Plan:   Bilateral conjunctivitis - Plan: tobramycin (TOBREX) 0.3 % ophthalmic solution  Cough - Plan: amoxicillin (AMOXIL) 875 MG tablet  Toe fracture, right, with routine healing, subsequent encounter  Chronic bronchitis, unspecified chronic bronchitis type - Plan: amoxicillin (AMOXIL) 875 MG tablet  Need for prophylactic vaccination and inoculation against influenza - Plan: Flu Vaccine QUAD 36+ mos IM  Signed, Robyn Haber, MD

## 2014-02-25 NOTE — Patient Instructions (Signed)
Acute Bronchitis Bronchitis is inflammation of the airways that extend from the windpipe into the lungs (bronchi). The inflammation often causes mucus to develop. This leads to a cough, which is the most common symptom of bronchitis.  In acute bronchitis, the condition usually develops suddenly and goes away over time, usually in a couple weeks. Smoking, allergies, and asthma can make bronchitis worse. Repeated episodes of bronchitis may cause further lung problems.  CAUSES Acute bronchitis is most often caused by the same virus that causes a cold. The virus can spread from person to person (contagious) through coughing, sneezing, and touching contaminated objects. SIGNS AND SYMPTOMS   Cough.   Fever.   Coughing up mucus.   Body aches.   Chest congestion.   Chills.   Shortness of breath.   Sore throat.  DIAGNOSIS  Acute bronchitis is usually diagnosed through a physical exam. Your health care provider will also ask you questions about your medical history. Tests, such as chest X-rays, are sometimes done to rule out other conditions.  TREATMENT  Acute bronchitis usually goes away in a couple weeks. Oftentimes, no medical treatment is necessary. Medicines are sometimes given for relief of fever or cough. Antibiotic medicines are usually not needed but may be prescribed in certain situations. In some cases, an inhaler may be recommended to help reduce shortness of breath and control the cough. A cool mist vaporizer may also be used to help thin bronchial secretions and make it easier to clear the chest.  HOME CARE INSTRUCTIONS  Get plenty of rest.   Drink enough fluids to keep your urine clear or pale yellow (unless you have a medical condition that requires fluid restriction). Increasing fluids may help thin your respiratory secretions (sputum) and reduce chest congestion, and it will prevent dehydration.   Take medicines only as directed by your health care provider.  If  you were prescribed an antibiotic medicine, finish it all even if you start to feel better.  Avoid smoking and secondhand smoke. Exposure to cigarette smoke or irritating chemicals will make bronchitis worse. If you are a smoker, consider using nicotine gum or skin patches to help control withdrawal symptoms. Quitting smoking will help your lungs heal faster.   Reduce the chances of another bout of acute bronchitis by washing your hands frequently, avoiding people with cold symptoms, and trying not to touch your hands to your mouth, nose, or eyes.   Keep all follow-up visits as directed by your health care provider.  SEEK MEDICAL CARE IF: Your symptoms do not improve after 1 week of treatment.  SEEK IMMEDIATE MEDICAL CARE IF:  You develop an increased fever or chills.   You have chest pain.   You have severe shortness of breath.  You have bloody sputum.   You develop dehydration.  You faint or repeatedly feel like you are going to pass out.  You develop repeated vomiting.  You develop a severe headache. MAKE SURE YOU:   Understand these instructions.  Will watch your condition.  Will get help right away if you are not doing well or get worse. Document Released: 05/23/2004 Document Revised: 08/30/2013 Document Reviewed: 10/06/2012 [facility] Patient Information 2015 [place], [place]. This information is not intended to replace advice given to you by your health care provider. Make sure you discuss any questions you have with your health care provider. Conjunctivitis Conjunctivitis is commonly called "pink eye." Conjunctivitis can be caused by bacterial or viral infection, allergies, or injuries. There is usually redness of  the lining of the eye, itching, discomfort, and sometimes discharge. There may be deposits of matter along the eyelids. A viral infection usually causes a watery discharge, while a bacterial infection causes a yellowish, thick discharge. Pink eye is very  contagious and spreads by direct contact. You may be given antibiotic eyedrops as part of your treatment. Before using your eye medicine, remove all drainage from the eye by washing gently with warm water and cotton balls. Continue to use the medication until you have awakened 2 mornings in a row without discharge from the eye. Do not rub your eye. This increases the irritation and helps spread infection. Use separate towels from other household members. Wash your hands with soap and water before and after touching your eyes. Use cold compresses to reduce pain and sunglasses to relieve irritation from light. Do not wear contact lenses or wear eye makeup until the infection is gone. SEEK MEDICAL CARE IF:   Your symptoms are not better after 3 days of treatment.  You have increased pain or trouble seeing.  The outer eyelids become very red or swollen. Document Released: 05/23/2004 Document Revised: 07/08/2011 Document Reviewed: 04/15/2005 [facility] Patient Information 2015 [place], [place]. This information is not intended to replace advice given to you by your health care provider. Make sure you discuss any questions you have with your health care provider.

## 2014-03-29 ENCOUNTER — Ambulatory Visit (INDEPENDENT_AMBULATORY_CARE_PROVIDER_SITE_OTHER): Payer: Commercial Managed Care - PPO | Admitting: Physician Assistant

## 2014-03-29 VITALS — BP 112/80 | HR 79 | Temp 98.0°F | Resp 18 | Ht 65.0 in | Wt 238.8 lb

## 2014-03-29 DIAGNOSIS — Z720 Tobacco use: Secondary | ICD-10-CM

## 2014-03-29 DIAGNOSIS — R05 Cough: Secondary | ICD-10-CM

## 2014-03-29 DIAGNOSIS — J309 Allergic rhinitis, unspecified: Secondary | ICD-10-CM

## 2014-03-29 DIAGNOSIS — F17200 Nicotine dependence, unspecified, uncomplicated: Secondary | ICD-10-CM

## 2014-03-29 DIAGNOSIS — R062 Wheezing: Secondary | ICD-10-CM

## 2014-03-29 DIAGNOSIS — J4540 Moderate persistent asthma, uncomplicated: Secondary | ICD-10-CM

## 2014-03-29 DIAGNOSIS — R059 Cough, unspecified: Secondary | ICD-10-CM

## 2014-03-29 MED ORDER — ALBUTEROL SULFATE HFA 108 (90 BASE) MCG/ACT IN AERS: 2.0000 | INHALATION_SPRAY | RESPIRATORY_TRACT | Status: DC | PRN

## 2014-03-29 MED ORDER — PREDNISONE 20 MG PO TABS: ORAL_TABLET | ORAL | Status: DC

## 2014-03-29 MED ORDER — BECLOMETHASONE DIPROPIONATE 40 MCG/ACT IN AERS: 1.0000 | INHALATION_SPRAY | Freq: Two times a day (BID) | RESPIRATORY_TRACT | Status: DC

## 2014-03-29 MED ORDER — TRIAMCINOLONE ACETONIDE 55 MCG/ACT NA AERO: 2.0000 | INHALATION_SPRAY | Freq: Every day | NASAL | Status: DC

## 2014-03-29 NOTE — Progress Notes (Signed)
The patient was discussed with me and I agree with the diagnosis and treatment plan.  

## 2014-03-29 NOTE — Patient Instructions (Signed)
Asthma Asthma is a recurring condition in which the airways tighten and narrow. Asthma can make it difficult to breathe. It can cause coughing, wheezing, and shortness of breath. Asthma episodes, also called asthma attacks, range from minor to life-threatening. Asthma cannot be cured, but medicines and lifestyle changes can help control it. CAUSES Asthma is believed to be caused by inherited (genetic) and environmental factors, but its exact cause is unknown. Asthma may be triggered by allergens, lung infections, or irritants in the air. Asthma triggers are different for each person. Common triggers include:   Animal dander.  Dust mites.  Cockroaches.  Pollen from trees or grass.  Mold.  Smoke.  Air pollutants such as dust, household cleaners, hair sprays, aerosol sprays, paint fumes, strong chemicals, or strong odors.  Cold air, weather changes, and winds (which increase molds and pollens in the air).  Strong emotional expressions such as crying or laughing hard.  Stress.  Certain medicines (such as aspirin) or types of drugs (such as beta-blockers).  Sulfites in foods and drinks. Foods and drinks that may contain sulfites include dried fruit, potato chips, and sparkling grape juice.  Infections or inflammatory conditions such as the flu, a cold, or an inflammation of the nasal membranes (rhinitis).  Gastroesophageal reflux disease (GERD).  Exercise or strenuous activity. SYMPTOMS Symptoms may occur immediately after asthma is triggered or many hours later. Symptoms include:  Wheezing.  Excessive nighttime or early morning coughing.  Frequent or severe coughing with a common cold.  Chest tightness.  Shortness of breath. DIAGNOSIS  The diagnosis of asthma is made by a review of your medical history and a physical exam. Tests may also be performed. These may include:  Lung function studies. These tests show how much air you breathe in and out.  Allergy  tests.  Imaging tests such as X-rays. TREATMENT  Asthma cannot be cured, but it can usually be controlled. Treatment involves identifying and avoiding your asthma triggers. It also involves medicines. There are 2 classes of medicine used for asthma treatment:   Controller medicines. These prevent asthma symptoms from occurring. They are usually taken every day.  Reliever or rescue medicines. These quickly relieve asthma symptoms. They are used as needed and provide short-term relief. Your health care provider will help you create an asthma action plan. An asthma action plan is a written plan for managing and treating your asthma attacks. It includes a list of your asthma triggers and how they may be avoided. It also includes information on when medicines should be taken and when their dosage should be changed. An action plan may also involve the use of a device called a peak flow meter. A peak flow meter measures how well the lungs are working. It helps you monitor your condition. HOME CARE INSTRUCTIONS   Take medicines only as directed by your health care provider. Speak with your health care provider if you have questions about how or when to take the medicines.  Use a peak flow meter as directed by your health care provider. Record and keep track of readings.  Understand and use the action plan to help minimize or stop an asthma attack without needing to seek medical care.  Control your home environment in the following ways to help prevent asthma attacks:  Do not smoke. Avoid being exposed to secondhand smoke.  Change your heating and air conditioning filter regularly.  Limit your use of fireplaces and wood stoves.  Get rid of pests (such as roaches and   mice) and their droppings.  Throw away plants if you see mold on them.  Clean your floors and dust regularly. Use unscented cleaning products.  Try to have someone else vacuum for you regularly. Stay out of rooms while they are  being vacuumed and for a short while afterward. If you vacuum, use a dust mask from a hardware store, a double-layered or microfilter vacuum cleaner bag, or a vacuum cleaner with a HEPA filter.  Replace carpet with wood, tile, or vinyl flooring. Carpet can trap dander and dust.  Use allergy-proof pillows, mattress covers, and box spring covers.  Wash bed sheets and blankets every week in hot water and dry them in a dryer.  Use blankets that are made of polyester or cotton.  Clean bathrooms and kitchens with bleach. If possible, have someone repaint the walls in these rooms with mold-resistant paint. Keep out of the rooms that are being cleaned and painted.  Wash hands frequently. SEEK MEDICAL CARE IF:   You have wheezing, shortness of breath, or a cough even if taking medicine to prevent attacks.  The colored mucus you cough up (sputum) is thicker than usual.  Your sputum changes from clear or white to yellow, green, gray, or bloody.  You have any problems that may be related to the medicines you are taking (such as a rash, itching, swelling, or trouble breathing).  You are using a reliever medicine more than 2-3 times per week.  Your peak flow is still at 50-79% of your personal best after following your action plan for 1 hour.  You have a fever. SEEK IMMEDIATE MEDICAL CARE IF:   You seem to be getting worse and are unresponsive to treatment during an asthma attack.  You are short of breath even at rest.  You get short of breath when doing very little physical activity.  You have difficulty eating, drinking, or talking due to asthma symptoms.  You develop chest pain.  You develop a fast heartbeat.  You have a bluish color to your lips or fingernails.  You are light-headed, dizzy, or faint.  Your peak flow is less than 50% of your personal best. MAKE SURE YOU:   Understand these instructions.  Will watch your condition.  Will get help right away if you are not  doing well or get worse. Document Released: 04/15/2005 Document Revised: 08/30/2013 Document Reviewed: 11/12/2012 [facility] Patient Information 2015 [place], [place]. This information is not intended to replace advice given to you by your health care provider. Make sure you discuss any questions you have with your health care provider.     Allergic Rhinitis Allergic rhinitis is when the mucous membranes in the nose respond to allergens. Allergens are particles in the air that cause your body to have an allergic reaction. This causes you to release allergic antibodies. Through a chain of events, these eventually cause you to release histamine into the blood stream. Although meant to protect the body, it is this release of histamine that causes your discomfort, such as frequent sneezing, congestion, and an itchy, runny nose.  CAUSES  Seasonal allergic rhinitis (hay fever) is caused by pollen allergens that may come from grasses, trees, and weeds. Year-round allergic rhinitis (perennial allergic rhinitis) is caused by allergens such as house dust mites, pet dander, and mold spores.  SYMPTOMS   Nasal stuffiness (congestion).  Itchy, runny nose with sneezing and tearing of the eyes. DIAGNOSIS  Your health care provider can help you determine the allergen or allergens that  trigger your symptoms. If you and your health care provider are unable to determine the allergen, skin or blood testing may be used. TREATMENT  Allergic rhinitis does not have a cure, but it can be controlled by:  Medicines and allergy shots (immunotherapy).  Avoiding the allergen. Hay fever may often be treated with antihistamines in pill or nasal spray forms. Antihistamines block the effects of histamine. There are over-the-counter medicines that may help with nasal congestion and swelling around the eyes. Check with your health care provider before taking or giving this medicine.  If avoiding the allergen or the medicine  prescribed do not work, there are many new medicines your health care provider can prescribe. Stronger medicine may be used if initial measures are ineffective. Desensitizing injections can be used if medicine and avoidance does not work. Desensitization is when a patient is given ongoing shots until the body becomes less sensitive to the allergen. Make sure you follow up with your health care provider if problems continue. HOME CARE INSTRUCTIONS It is not possible to completely avoid allergens, but you can reduce your symptoms by taking steps to limit your exposure to them. It helps to know exactly what you are allergic to so that you can avoid your specific triggers. SEEK MEDICAL CARE IF:   You have a fever.  You develop a cough that does not stop easily (persistent).  You have shortness of breath.  You start wheezing.  Symptoms interfere with normal daily activities. Document Released: 01/08/2001 Document Revised: 04/20/2013 Document Reviewed: 12/21/2012 [facility] Patient Information 2015 [place], [place]. This information is not intended to replace advice given to you by your health care provider. Make sure you discuss any questions you have with your health care provider.

## 2014-03-29 NOTE — Progress Notes (Signed)
MRN: [id number] DOB: 01-27-1991  Subjective:   Theresa Ortiz is a 23 y.o. female with pmh childhood asthma and 3 pack year smoking history presenting for several month history of productive cough. Patient was treated for CAP in 08/2013 with levaquin, albuterol inhaler at [facility] ED. Admits that she never used the inhaler because "she does not have asthma". She was seen again at [facility] and treated for bronchitis with Augmentin. Today, patient reports that her symptoms have never completely resolved since 08/2013, they may wane on some days but in the last month, her cough has been consistent throughout the day. Cough is productive with yellow sputum but no blood, no precipitating factors, not worse at night. She does have episodes of wheezing and associated shob with her cough, also sore throat worse with coughing. She denies fevers, sinus pain, ear pain, ear drainage, chest pain, current shob, abdominal pain. Currently going through smoking cessation, down to 1/2ppd. Admits that she has seasonal allergies, not taking any medications for this. Denies any other aggravating or relieving factors, no other questions or concerns.  Prior to Admission medications   Medication Sig Start Date End Date Taking? Authorizing Provider  amphetamine-dextroamphetamine (ADDERALL) 10 MG tablet Take 10 mg by mouth daily with breakfast.   Yes Historical Provider, MD  albuterol (PROVENTIL HFA;VENTOLIN HFA) 108 (90 BASE) MCG/ACT inhaler Inhale 1-2 puffs into the lungs every 6 (six) hours as needed for wheezing. Patient not taking: Reported on 03/29/2014 09/20/13   Tanna Furry, MD    Allergies  Allergen Reactions  . Raspberry Swelling    TONGUE SWELLS    Past Medical History  Diagnosis Date  . Acute meniscal tear of knee     left knee  . Allergy   . Asthma   . Shingles   . Ovarian cyst     Past Surgical History  Procedure Laterality Date  . Right index  finger tendon repair  NOV  2013  . Knee arthroscopy with lateral menisectomy Left 09/01/2012    Procedure: LEFT KNEE ARTHROSCOPY WITH LATERAL MENISCECTOMY ;  Surgeon: Tobi Bastos, MD;  Location: [place];  Service: Orthopedics;  Laterality: Left;    ROS As in subjective.   Objective:   Vitals: BP 112/80 mmHg  Pulse 79  Temp(Src) 98 F (36.7 C) (Oral)  Resp 18  Ht 5\' 5"  (1.651 m)  Wt 238 lb 12.8 oz (108.319 kg)  BMI 39.74 kg/m2  SpO2 98%  LMP 03/23/2014  Physical Exam  Constitutional: She is oriented to person, place, and time and well-developed, well-nourished, and in no distress.  HENT:  TM's somewhat flat bilaterally, no effusions or erythema. Nasal turbinates inflamed, rhinorrhea clear-yellow. Without oropharyngeal erythema, edema or exudates.  Eyes: Conjunctivae are normal. Right eye exhibits no discharge. Left eye exhibits no discharge. No scleral icterus.  Neck: Normal range of motion.  Cardiovascular: Normal rate, regular rhythm, normal heart sounds and intact distal pulses.  Exam reveals no gallop and no friction rub.   No murmur heard. Pulmonary/Chest: Effort normal. No stridor. No respiratory distress. She has wheezes (forced expiratory wheezing L>R). She has no rales. She exhibits no tenderness.  Lymphadenopathy:    She has no cervical adenopathy.  Neurological: She is alert and oriented to person, place, and time.  Skin: Skin is warm and dry. No rash noted. She is not diaphoretic. No erythema.   Office Spirometry Results: FEV1: 2.72 liters FVC: 3.46 liters FEV1/FVC: 78.6 %  FVC  % Predicted: 89 liters FEV % Predicted: 81 liters FeF 25-75: 2.44 liters FeF 25-75 % Predicted: 66  Assessment and Plan :   1. Asthma, moderate persistent, uncomplicated 2. Cough 3. Expiratory wheezing - Counseled patient on asthma, use of inhalers - Discussed benefits/risks of Prednisone dose pack, patient agreed to trial of oral steroid - PFT PULM FXN SPIROMETRY (94010) -  albuterol (PROVENTIL HFA;VENTOLIN HFA) 108 (90 BASE) MCG/ACT inhaler; Inhale 2 puffs into the lungs every 4 (four) hours as needed for wheezing or shortness of breath (cough, shortness of breath or wheezing.).  Dispense: 1 Inhaler; Refill: 1 - beclomethasone (QVAR) 40 MCG/ACT inhaler; Inhale 1 puff into the lungs 2 (two) times daily.  Dispense: 1 Inhaler; Refill: 1 - predniSONE (DELTASONE) 20 MG tablet; Take 2 tablets twice daily.  Dispense: 10 tablet; Refill: 0  4. Tobacco use disorder - Advised to continue smoking cessation for better control of asthma symptoms  5. Allergic rhinitis, unspecified allergic rhinitis type - triamcinolone (NASACORT AQ) 55 MCG/ACT AERO nasal inhaler; Place 2 sprays into the nose daily.  Dispense: 1 Inhaler; Refill: [place], PA-C [facility] [phone] 03/29/2014 3:54 PM

## 2014-04-01 ENCOUNTER — Telehealth: Payer: Self-pay

## 2014-04-01 NOTE — Telephone Encounter (Signed)
Patient has questions about the steroids she takes for an upper respiratory infection. She wants to know if it will change the effects of the Plan B she took this morning.

## 2014-04-04 NOTE — Telephone Encounter (Signed)
Please advise 

## 2014-04-05 NOTE — Telephone Encounter (Signed)
steroids should not change the effectiveness of Plan b

## 2014-04-05 NOTE — Telephone Encounter (Signed)
LM advised those two medications do not change the effectiveness of each other. RTC for testing or rtn call with any questions.

## 2014-07-04 DIAGNOSIS — J4530 Mild persistent asthma, uncomplicated: Secondary | ICD-10-CM | POA: Insufficient documentation

## 2014-07-29 DIAGNOSIS — Z720 Tobacco use: Secondary | ICD-10-CM | POA: Insufficient documentation

## 2014-12-28 ENCOUNTER — Ambulatory Visit (INDEPENDENT_AMBULATORY_CARE_PROVIDER_SITE_OTHER): Payer: 59 | Admitting: Family Medicine

## 2014-12-28 VITALS — BP 110/70 | HR 84 | Temp 98.5°F | Resp 16 | Ht 65.0 in | Wt 234.0 lb

## 2014-12-28 DIAGNOSIS — J209 Acute bronchitis, unspecified: Secondary | ICD-10-CM | POA: Diagnosis not present

## 2014-12-28 MED ORDER — AZITHROMYCIN 250 MG PO TABS: ORAL_TABLET | ORAL | Status: DC

## 2014-12-28 NOTE — Patient Instructions (Signed)
We are going to treat you with azithromycin for bronchitis Let us know if you are not feeling better soon Please quit smoking!  This is by far the most important thing you can do for your health

## 2014-12-28 NOTE — Progress Notes (Signed)
[facility] [address] [place] [address] [phone]  Date:  12/28/2014   Name:  Theresa Ortiz   DOB:  1990/09/23   MRN:  [id number]  PCP:  Luther Hearing, RN    Chief Complaint: Sinusitis; Ear Pain; and chest congestion   History of Present Illness:  Theresa Ortiz is a 24 y.o. very pleasant female patient who presents with the following:  Generally healthy young woman here today with complaint of runny nose, green mucus from her nose, and now cough and congestion.  Illness started about 5 days ago, seemed to "move to my chest" 2 or 3 days ago  She has not noted a fever, but did have some chills No GI symptoms No sore throat, ears are stuffy At home she has tried some mucinex She has cut back on cigarettes but is still smoking  She had asthma as a child, but has not needed to use her albuterol for a while. Last May she was ill with pneumonia and is afraid that this will happen again  Patient Active Problem List   Diagnosis Date Noted  . Acute lateral meniscus tear of left knee 09/01/2012    Past Medical History  Diagnosis Date  . Acute meniscal tear of knee     left knee  . Allergy   . Asthma   . Shingles   . Ovarian cyst     Past Surgical History  Procedure Laterality Date  . Right index  finger tendon repair  NOV 2013  . Knee arthroscopy with lateral menisectomy Left 09/01/2012    Procedure: LEFT KNEE ARTHROSCOPY WITH LATERAL MENISCECTOMY ;  Surgeon: Tobi Bastos, MD;  Location: [place];  Service: Orthopedics;  Laterality: Left;    Social History  Substance Use Topics  . Smoking status: Current Every Day Smoker -- 1.00 packs/day for 3 years    Types: Cigarettes  . Smokeless tobacco: Never Used     Comment:    1 PP3D  . Alcohol Use: 3.0 oz/week    6 drink(s) per week     Comment: 2 times week    Family History  Problem Relation Age of Onset  . Hyperlipidemia Mother   . Diabetes Father    . Hyperlipidemia Maternal Grandmother   . Hyperlipidemia Maternal Grandfather   . Cancer Maternal Grandfather   . Hyperlipidemia Paternal Grandmother   . Heart disease Paternal Grandmother     Allergies  Allergen Reactions  . Raspberry Swelling    TONGUE SWELLS    Medication list has been reviewed and updated.  Current Outpatient Prescriptions on File Prior to Visit  Medication Sig Dispense Refill  . amphetamine-dextroamphetamine (ADDERALL) 10 MG tablet Take 10 mg by mouth daily with breakfast.    . predniSONE (DELTASONE) 20 MG tablet Take 2 tablets twice daily. (Patient not taking: Reported on 12/28/2014) 10 tablet 0  . triamcinolone (NASACORT AQ) 55 MCG/ACT AERO nasal inhaler Place 2 sprays into the nose daily. (Patient not taking: Reported on 12/28/2014) 1 Inhaler 12   No current facility-administered medications on file prior to visit.    Review of Systems:  As per HPI- otherwise negative.   Physical Examination: Filed Vitals:   12/28/14 1326  BP: 110/70  Pulse: 84  Temp: 98.5 F (36.9 C)  Resp: 16   Filed Vitals:   12/28/14 1326  Height: 5\' 5"  (1.651 m)  Weight: 234 lb (106.142 kg)   Body mass index  is 38.94 kg/(m^2). Ideal Body Weight: Weight in (lb) to have BMI = 25: 149.9  GEN: WDWN, NAD, Non-toxic, A & O x 3, obese, looks well HEENT: Atraumatic, Normocephalic. Neck supple. No masses, No LAD.  Bilateral TM wnl, oropharynx normal.  PEERL,EOMI.   Nasal cavity congestion Ears and Nose: No external deformity. CV: RRR, No M/G/R. No JVD. No thrill. No extra heart sounds. PULM: CTA B, no wheezes, crackles, rhonchi. No retractions. No resp. distress. No accessory muscle use. EXTR: No c/c/e NEURO Normal gait.  PSYCH: Normally interactive. Conversant. Not depressed or anxious appearing.  Calm demeanor.    Assessment and Plan: Acute bronchitis, unspecified organism - Plan: azithromycin (ZITHROMAX) 250 MG tablet ' Treat for bronchitis with azithromycin.  She  will let us know if not better soon Encouraged her to quit smoking asap  Signed Lamar Blinks, MD

## 2015-05-15 ENCOUNTER — Encounter (HOSPITAL_COMMUNITY): Payer: Self-pay | Admitting: Emergency Medicine

## 2015-05-15 ENCOUNTER — Emergency Department (INDEPENDENT_AMBULATORY_CARE_PROVIDER_SITE_OTHER)
Admission: EM | Admit: 2015-05-15 | Discharge: 2015-05-15 | Disposition: A | Discharge status: Home / Self Care | Payer: 59 | Source: Home / Self Care | Attending: Family Medicine | Admitting: Family Medicine

## 2015-05-15 ENCOUNTER — Emergency Department (INDEPENDENT_AMBULATORY_CARE_PROVIDER_SITE_OTHER): Payer: 59

## 2015-05-15 DIAGNOSIS — S99921A Unspecified injury of right foot, initial encounter: Secondary | ICD-10-CM | POA: Diagnosis not present

## 2015-05-15 DIAGNOSIS — S90112A Contusion of left great toe without damage to nail, initial encounter: Secondary | ICD-10-CM | POA: Diagnosis not present

## 2015-05-15 NOTE — ED Provider Notes (Signed)
CSN: [id number]     Arrival date & time 05/15/15  1406 History   First MD Initiated Contact with Patient 05/15/15 1607     Chief Complaint  Patient presents with  . Foot Injury   (Consider location/radiation/quality/duration/timing/severity/associated sxs/prior Treatment) HPI History obtained from patient:   LOCATION:left foot SEVERITY:2 DURATION:since saturday CONTEXT:dropped metal shelf onto foot QUALITY:ache MODIFYING FACTORS:ice, elevation and rest ASSOCIATED SYMPTOMS:bruise TIMING:constant OCCUPATION:student , bar tender  Past Medical History  Diagnosis Date  . Acute meniscal tear of knee     left knee  . Allergy   . Asthma   . Shingles   . Ovarian cyst    Past Surgical History  Procedure Laterality Date  . Right index  finger tendon repair  NOV 2013  . Knee arthroscopy with lateral menisectomy Left 09/01/2012    Procedure: LEFT KNEE ARTHROSCOPY WITH LATERAL MENISCECTOMY ;  Surgeon: Tobi Bastos, MD;  Location: [place];  Service: Orthopedics;  Laterality: Left;   Family History  Problem Relation Age of Onset  . Hyperlipidemia Mother   . Diabetes Father   . Hyperlipidemia Maternal Grandmother   . Hyperlipidemia Maternal Grandfather   . Cancer Maternal Grandfather   . Hyperlipidemia Paternal Grandmother   . Heart disease Paternal Grandmother    Social History  Substance Use Topics  . Smoking status: Current Every Day Smoker -- 1.00 packs/day for 3 years    Types: Cigarettes  . Smokeless tobacco: Never Used     Comment:    1 PP3D  . Alcohol Use: 3.0 oz/week    6 drink(s) per week     Comment: 2 times week   OB History    No data available     Review of Systems ROS +'ve left foot injury  Denies: HEADACHE, NAUSEA, ABDOMINAL PAIN, CHEST PAIN, CONGESTION, DYSURIA, SHORTNESS OF BREATH  Allergies  Raspberry  Home Medications   Prior to Admission medications   Medication Sig Start Date End Date Taking? Authorizing Provider   amphetamine-dextroamphetamine (ADDERALL) 10 MG tablet Take 10 mg by mouth daily with breakfast.    Historical Provider, MD  azithromycin (ZITHROMAX) 250 MG tablet Use as a zpack 12/28/14   Gay Filler Copland, MD  triamcinolone (NASACORT AQ) 55 MCG/ACT AERO nasal inhaler Place 2 sprays into the nose daily. Patient not taking: Reported on 12/28/2014 03/29/14   Jaynee Eagles, PA-C   Meds Ordered and Administered this Visit  Medications - No data to display  BP 126/78 mmHg  Pulse 64  Temp(Src) 97.9 F (36.6 C) (Oral)  SpO2 100%  LMP 04/17/2015 No data found.   Physical Exam  ED Course  Procedures (including critical care time)  Labs Review Labs Reviewed - No data to display  Imaging Review Dg Foot Complete Left  05/15/2015  CLINICAL DATA:  Dropped metal shelf on foot on Saturday. Bruising around the toes. Initial encounter. EXAM: LEFT FOOT - COMPLETE 3+ VIEW COMPARISON:  None. FINDINGS: There is no evidence of fracture or dislocation. There is no evidence of arthropathy or other focal bone abnormality. Soft tissues are unremarkable. IMPRESSION: Negative. Electronically Signed   By: Monte Fantasia M.D.   On: 05/15/2015 16:01     Visual Acuity Review  Right Eye Distance:   Left Eye Distance:   Bilateral Distance:    Right Eye Near:   Left Eye Near:    Bilateral Near:       Reviewed xrays with patient. No fracture identified.   MDM  1. Foot injury, right, initial encounter     Symptomatic treatment at home Activity as tolerated.    Konrad Felix, PA 05/15/15 Olar Frazer, [place] 05/15/15 [phone]

## 2015-05-15 NOTE — Discharge Instructions (Signed)
Foot Contusion  A foot contusion is a deep bruise to the foot. Contusions happen when an injury causes bleeding under the skin. Signs of bruising include pain, puffiness (swelling), and discolored skin. The contusion may turn blue, purple, or yellow. HOME CARE  Put ice on the injured area.  Put ice in a plastic bag.  Place a towel between your skin and the bag.  Leave the ice on for 15-20 minutes, 03-04 times a day.  Only take medicines as told by your doctor.  Use an elastic wrap only as told. You may remove the wrap for sleeping, showering, and bathing. Take the wrap off if you lose feeling (numb) in your toes, or they turn blue or cold. Put the wrap on more loosely.  Keep the foot raised (elevated) with pillows.  If your foot hurts, avoid standing or walking.  When your doctor says it is okay to use your foot, start using it slowly. If you have pain, lessen how much you use your foot.  See your doctor as told. GET HELP RIGHT AWAY IF:   You have more redness, puffiness, or pain in your foot.  Your puffiness or pain does not get better with medicine.  You lose feeling in your foot, or you cannot move your toes.  Your foot turns cold or blue.  You have pain when you move your toes.  Your foot feels warm.  Your contusion does not get better in 2 days. MAKE SURE YOU:   Understand these instructions.  Will watch this condition.  Will get help right away if you or your child is not doing well or gets worse.   This information is not intended to replace advice given to you by your health care provider. Make sure you discuss any questions you have with your health care provider.   Document Released: 01/23/2008 Document Revised: 10/15/2011 Document Reviewed: 12/20/2014 Elsevier Interactive Patient Education [employer].

## 2015-05-15 NOTE — ED Notes (Signed)
Here with c/o left foot swelling, bruises near middle metatarsal and pain after metal shelf fell on foot Saturday Up to date on T-dap Ice, elevation

## 2015-05-31 MED FILL — AMPHETAMINE SALTS 10 MG TAB: 10 | 30 days supply | Qty: 60 | Fill #0

## 2015-06-28 DIAGNOSIS — Z114 Encounter for screening for human immunodeficiency virus [HIV]: Secondary | ICD-10-CM | POA: Diagnosis not present

## 2015-06-28 DIAGNOSIS — Z1159 Encounter for screening for other viral diseases: Secondary | ICD-10-CM | POA: Diagnosis not present

## 2015-06-28 DIAGNOSIS — R87610 Atypical squamous cells of undetermined significance on cytologic smear of cervix (ASC-US): Secondary | ICD-10-CM | POA: Diagnosis not present

## 2015-06-28 DIAGNOSIS — Z113 Encounter for screening for infections with a predominantly sexual mode of transmission: Secondary | ICD-10-CM | POA: Diagnosis not present

## 2015-06-28 DIAGNOSIS — Z01419 Encounter for gynecological examination (general) (routine) without abnormal findings: Secondary | ICD-10-CM | POA: Diagnosis not present

## 2015-06-28 MED FILL — FLUCONAZOLE 150 MG TABLET: 150 | 7 days supply | Qty: 3 | Fill #0

## 2015-07-05 DIAGNOSIS — Z3202 Encounter for pregnancy test, result negative: Secondary | ICD-10-CM | POA: Diagnosis not present

## 2015-07-05 DIAGNOSIS — Z30017 Encounter for initial prescription of implantable subdermal contraceptive: Secondary | ICD-10-CM | POA: Diagnosis not present

## 2015-07-19 ENCOUNTER — Ambulatory Visit (INDEPENDENT_AMBULATORY_CARE_PROVIDER_SITE_OTHER): Payer: 59 | Admitting: Podiatry

## 2015-07-19 ENCOUNTER — Ambulatory Visit (INDEPENDENT_AMBULATORY_CARE_PROVIDER_SITE_OTHER): Payer: 59

## 2015-07-19 ENCOUNTER — Encounter: Payer: Self-pay | Admitting: Podiatry

## 2015-07-19 VITALS — BP 105/77 | HR 75 | Resp 16

## 2015-07-19 DIAGNOSIS — M6740 Ganglion, unspecified site: Secondary | ICD-10-CM

## 2015-07-19 DIAGNOSIS — M722 Plantar fascial fibromatosis: Secondary | ICD-10-CM

## 2015-07-19 DIAGNOSIS — M79673 Pain in unspecified foot: Secondary | ICD-10-CM

## 2015-07-19 MED ORDER — TRIAMCINOLONE ACETONIDE 10 MG/ML IJ SUSP
10.0000 mg | Freq: Once | INTRAMUSCULAR | Status: AC
  Administered 2015-07-19: 10 mg

## 2015-07-19 MED ORDER — DICLOFENAC SODIUM 75 MG PO TBEC: 75.0000 mg | DELAYED_RELEASE_TABLET | Freq: Two times a day (BID) | ORAL | Status: DC

## 2015-07-19 MED FILL — DICLOFENAC SOD EC 75 MG TAB: 75 | 25 days supply | Qty: 50 | Fill #0

## 2015-07-19 NOTE — Progress Notes (Signed)
   Subjective:    Patient ID: Theresa Ortiz, female    DOB: 20-Mar-1991, 25 y.o.   MRN: [id number]  HPI  Pt presents with bilateral heel pain lasting 3 years and worsening when resting and ambulating. Tried supportive shoes. She also c/o bilateral cyst/ knot on dorsal side of foot  Review of Systems  Musculoskeletal: Positive for arthralgias.  All other systems reviewed and are negative.      Objective:   Physical Exam        Assessment & Plan:

## 2015-07-19 NOTE — Progress Notes (Signed)
Subjective:     Patient ID: Theresa Ortiz, female   DOB: 29-Jun-1990, 25 y.o.   MRN: [id number]  HPI patient presents stating I'm having a lot of pain in the heels of both my feet that's been present for around 3 years but is worsened over the last few months. I do of a family history of this and also have a cyst on top of my left ankle which is been bothering me for the last month or 2 and I don't remember any kind of injury or other issue that may have caused this   Review of Systems  All other systems reviewed and are negative.      Objective:   Physical Exam  Constitutional: She is oriented to person, place, and time.  Cardiovascular: Intact distal pulses.   Musculoskeletal: Normal range of motion.  Neurological: She is oriented to person, place, and time.  Skin: Skin is warm.  Nursing note and vitals reviewed.  neurovascular status found to be intact with muscle strength adequate range of motion within normal limits. Patient's found to have mild swelling of the feet and is found to have some skin manifestations of fungal infection and was noted to have exquisite discomfort plantar fascial bilateral at the insertion of the tendon into the calcaneus. Patient is noted dorsally within the ankle joint left to have a small nodule measuring approximately 1.5 cm x 1.5 cm that's freely movable within subcutaneous tissue. Patient has good digital perfusion and is well oriented 3     Assessment:     Chronic plantar fasciitis bilateral with acute flareup and probable ganglionic cyst or unknown cyst dorsum left ankle    Plan:     H&P and x-rays reviewed with patient. Today I injected the plantar fascia bilateral 3 mg Kenalog 5 mg Xylocaine and applied fascial brace bilateral to lifting the arch. For the left I did a proximal nerve block aspirated this getting out a small amount of gelatinous material and discussed the possibility for excision if it remains tender. Patient will begin hot  compresses for the left and also we'll begin physical therapy for stretching the plantar fascia and will be seen back in the next several weeks for probable orthotic therapy. Placed on diclofenac 75 mg twice a day  X-ray report indicated large spur formation plantar heel bilateral with small spurring around the talus left

## 2015-07-19 NOTE — Patient Instructions (Addendum)

## 2015-07-31 DIAGNOSIS — R7301 Impaired fasting glucose: Secondary | ICD-10-CM | POA: Diagnosis not present

## 2015-07-31 DIAGNOSIS — Z6838 Body mass index (BMI) 38.0-38.9, adult: Secondary | ICD-10-CM | POA: Diagnosis not present

## 2015-07-31 DIAGNOSIS — J4530 Mild persistent asthma, uncomplicated: Secondary | ICD-10-CM | POA: Diagnosis not present

## 2015-07-31 DIAGNOSIS — E668 Other obesity: Secondary | ICD-10-CM | POA: Diagnosis not present

## 2015-07-31 DIAGNOSIS — F900 Attention-deficit hyperactivity disorder, predominantly inattentive type: Secondary | ICD-10-CM | POA: Diagnosis not present

## 2015-07-31 DIAGNOSIS — F17200 Nicotine dependence, unspecified, uncomplicated: Secondary | ICD-10-CM | POA: Diagnosis not present

## 2015-08-01 ENCOUNTER — Telehealth: Payer: Self-pay | Admitting: *Deleted

## 2015-08-01 NOTE — Telephone Encounter (Signed)
Pt states she has an appt tomorrow and wanted to know what he would do.  I told her depending on her symptoms he may do injections again, inform her on the orthotics and continue home PT.

## 2015-08-02 ENCOUNTER — Ambulatory Visit: Payer: 59 | Admitting: Podiatry

## 2015-08-23 DIAGNOSIS — H5213 Myopia, bilateral: Secondary | ICD-10-CM | POA: Diagnosis not present

## 2015-12-20 DIAGNOSIS — M545 Low back pain: Secondary | ICD-10-CM | POA: Diagnosis not present

## 2015-12-20 DIAGNOSIS — M9903 Segmental and somatic dysfunction of lumbar region: Secondary | ICD-10-CM | POA: Diagnosis not present

## 2015-12-20 DIAGNOSIS — M9904 Segmental and somatic dysfunction of sacral region: Secondary | ICD-10-CM | POA: Diagnosis not present

## 2015-12-20 DIAGNOSIS — M9905 Segmental and somatic dysfunction of pelvic region: Secondary | ICD-10-CM | POA: Diagnosis not present

## 2015-12-20 MED FILL — HYDROCODON-APAP 5-325: 5-325 | 7 days supply | Qty: 30 | Fill #0

## 2015-12-20 MED FILL — METHOCARBAMOL 500 MG TABLET: 500 | 10 days supply | Qty: 30 | Fill #0

## 2015-12-20 MED FILL — predniSONE 10 MG TABS: 10 | 6 days supply | Qty: 21 | Fill #0

## 2015-12-22 MED FILL — DEXTROAMP-AMP 10 MG TAB: 10 | 30 days supply | Qty: 60 | Fill #0

## 2016-01-25 DIAGNOSIS — L309 Dermatitis, unspecified: Secondary | ICD-10-CM | POA: Diagnosis not present

## 2016-01-25 DIAGNOSIS — D485 Neoplasm of uncertain behavior of skin: Secondary | ICD-10-CM | POA: Diagnosis not present

## 2016-01-29 DIAGNOSIS — Z23 Encounter for immunization: Secondary | ICD-10-CM | POA: Diagnosis not present

## 2016-01-29 DIAGNOSIS — J4530 Mild persistent asthma, uncomplicated: Secondary | ICD-10-CM | POA: Diagnosis not present

## 2016-01-29 DIAGNOSIS — F17200 Nicotine dependence, unspecified, uncomplicated: Secondary | ICD-10-CM | POA: Diagnosis not present

## 2016-01-29 DIAGNOSIS — F900 Attention-deficit hyperactivity disorder, predominantly inattentive type: Secondary | ICD-10-CM | POA: Diagnosis not present

## 2016-01-29 DIAGNOSIS — E668 Other obesity: Secondary | ICD-10-CM | POA: Diagnosis not present

## 2016-01-29 DIAGNOSIS — Z6839 Body mass index (BMI) 39.0-39.9, adult: Secondary | ICD-10-CM | POA: Diagnosis not present

## 2016-01-29 DIAGNOSIS — R7301 Impaired fasting glucose: Secondary | ICD-10-CM | POA: Diagnosis not present

## 2016-01-29 MED FILL — SALICYLIC ACID 6% LOTION: 6 | 25 days supply | Qty: 473 | Fill #0

## 2016-01-29 MED FILL — AMPHETAMINE SALTS 10 MG TAB: 10 | 30 days supply | Qty: 60 | Fill #0

## 2016-02-27 MED FILL — DEXTROAMP-AMP 10 MG TAB: 10 | 30 days supply | Qty: 60 | Fill #0

## 2018-02-03 ENCOUNTER — Other Ambulatory Visit: Payer: Self-pay | Admitting: Orthopaedic Surgery

## 2018-02-03 DIAGNOSIS — M25511 Pain in right shoulder: Secondary | ICD-10-CM

## 2018-02-20 ENCOUNTER — Ambulatory Visit
Admission: RE | Admit: 2018-02-20 | Discharge: 2018-02-20 | Disposition: A | Discharge status: Home / Self Care | Payer: Self-pay | Source: Ambulatory Visit | Attending: Orthopaedic Surgery | Admitting: Orthopaedic Surgery

## 2018-02-20 ENCOUNTER — Ambulatory Visit
Admission: RE | Admit: 2018-02-20 | Discharge: 2018-02-20 | Disposition: A | Discharge status: Home / Self Care | Payer: 59 | Source: Ambulatory Visit | Attending: Orthopaedic Surgery | Admitting: Orthopaedic Surgery

## 2018-02-20 ENCOUNTER — Other Ambulatory Visit: Payer: Self-pay | Admitting: Orthopaedic Surgery

## 2018-02-20 DIAGNOSIS — M25511 Pain in right shoulder: Secondary | ICD-10-CM

## 2018-02-20 DIAGNOSIS — R52 Pain, unspecified: Secondary | ICD-10-CM

## 2018-02-20 MED ORDER — IOPAMIDOL (ISOVUE-M 200) INJECTION 41%
15.0000 mL | Freq: Once | INTRAMUSCULAR | Status: AC
  Administered 2018-02-20: 15 mL via INTRA_ARTICULAR

## 2018-05-21 DIAGNOSIS — M25511 Pain in right shoulder: Secondary | ICD-10-CM | POA: Diagnosis not present

## 2018-05-21 MED FILL — MELOXICAM 7.5 MG TABLET: 7.5 | 30 days supply | Qty: 30 | Fill #0

## 2018-05-25 DIAGNOSIS — R82998 Other abnormal findings in urine: Secondary | ICD-10-CM | POA: Diagnosis not present

## 2018-05-25 DIAGNOSIS — R7301 Impaired fasting glucose: Secondary | ICD-10-CM | POA: Diagnosis not present

## 2018-05-25 DIAGNOSIS — F17200 Nicotine dependence, unspecified, uncomplicated: Secondary | ICD-10-CM | POA: Diagnosis not present

## 2018-05-25 DIAGNOSIS — Z1331 Encounter for screening for depression: Secondary | ICD-10-CM | POA: Diagnosis not present

## 2018-05-25 DIAGNOSIS — F331 Major depressive disorder, recurrent, moderate: Secondary | ICD-10-CM | POA: Insufficient documentation

## 2018-05-25 DIAGNOSIS — J4530 Mild persistent asthma, uncomplicated: Secondary | ICD-10-CM | POA: Diagnosis not present

## 2018-05-25 DIAGNOSIS — Z6841 Body Mass Index (BMI) 40.0 and over, adult: Secondary | ICD-10-CM | POA: Diagnosis not present

## 2018-05-25 DIAGNOSIS — F900 Attention-deficit hyperactivity disorder, predominantly inattentive type: Secondary | ICD-10-CM | POA: Diagnosis not present

## 2018-05-25 DIAGNOSIS — Z0000 Encounter for general adult medical examination without abnormal findings: Secondary | ICD-10-CM | POA: Diagnosis not present

## 2018-05-25 MED FILL — ADDERALL XR 20 MG CAP SA: 20 | 30 days supply | Qty: 30 | Fill #0

## 2018-05-29 MED FILL — CEPHALEXIN 250 MG CAPSULE: 250 | 3 days supply | Qty: 9 | Fill #0

## 2018-06-01 ENCOUNTER — Ambulatory Visit: Payer: 59 | Attending: Orthopaedic Surgery | Admitting: Physical Therapy

## 2018-06-01 ENCOUNTER — Encounter: Payer: Self-pay | Admitting: Physical Therapy

## 2018-06-01 DIAGNOSIS — G8929 Other chronic pain: Secondary | ICD-10-CM | POA: Insufficient documentation

## 2018-06-01 DIAGNOSIS — M25511 Pain in right shoulder: Secondary | ICD-10-CM | POA: Insufficient documentation

## 2018-06-01 NOTE — Patient Instructions (Signed)
  Access Code: [email or web address]  URL: [email or web address]  Date: 06/01/2018  Prepared by: Elsie Ra   Exercises  Standing Shoulder Posterior Capsule Stretch - 3 sets - 30 hold - 2x daily - 6x weekly  Doorway Pec Stretch at 90 Degrees Abduction - 3 sets - 30 hold - 2x daily - 6x weekly  Standing Shoulder Row with Anchored Resistance - 10 reps - 3 sets - 2x daily - 6x weekly  Shoulder Extension with Resistance - Neutral - 10 reps - 3 sets - 2x daily - 6x weekly  Shoulder External Rotation with Anchored Resistance - 10 reps - 3 sets - 2x daily - 6x weekly  Shoulder Internal Rotation with Resistance - 10 reps - 3 sets - 2x daily - 6x weekly  Prone Single Arm Shoulder Y - 10 reps - 3 sets - 2x daily - 6x weekly  Prone Shoulder Horizontal Abduction - 10 reps - 2-3 sets - 2x daily - 6x weekly

## 2018-06-01 NOTE — Therapy (Signed)
[place], [place], [address] Phone: [phone]   Fax:  [phone]  Physical Therapy Evaluation  Patient Details  Name: Theresa Ortiz MRN: [id number] Date of Birth: Mar 21, 1991 Referring Provider (PT): Griffin Basil, MD   Encounter Date: 06/01/2018  PT End of Session - 06/01/18 2215    Visit Number  1    Number of Visits  12    Date for PT Re-Evaluation  07/13/18    Authorization Type  [facility]    PT Start Time  1500    PT Stop Time  1545    PT Time Calculation (min)  45 min    Activity Tolerance  Patient tolerated treatment well    Behavior During Therapy  [facility] for tasks assessed/performed       Past Medical History:  Diagnosis Date  . Acute meniscal tear of knee    left knee  . Allergy   . Asthma   . Ovarian cyst   . Shingles     Past Surgical History:  Procedure Laterality Date  . KNEE ARTHROSCOPY WITH LATERAL MENISECTOMY Left 09/01/2012   Procedure: LEFT KNEE ARTHROSCOPY WITH LATERAL MENISCECTOMY ;  Surgeon: Tobi Bastos, MD;  Location: [facility];  Service: Orthopedics;  Laterality: Left;  . RIGHT INDEX  FINGER TENDON REPAIR  NOV 2013    There were no vitals filed for this visit.   Subjective Assessment - 06/01/18 2159    Subjective  Pt relays one year onset of Rt shoulder pain. 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 She MD relays MD thinks she will likely need surgery but pt wants to put this off for another year. Her biggest complaint is pain with lifting, carrying, reaching out or up or behind back. She was working out doing [employer] but had to stop due to shoulder pain    Pertinent History  PMH:LT knee scope, Rt finger tendon repair    Limitations  Lifting    Diagnostic tests  CT with contrast shows SLAP tear and Os acromiale with superior spurring    Patient Stated Goals  get the pain down and use  my arm more    Currently in Pain?  Yes    Pain Score  6     Pain Location  Shoulder    Pain Orientation  Right    Pain Descriptors / Indicators  Aching;Sharp;Sore    Pain Type  Chronic pain    Pain Radiating Towards  down lateral upper arm and to scapula    Pain Onset  More than a month ago    Pain Frequency  Intermittent    Aggravating Factors   reaching up, out, behind back, lifting carrying    Pain Relieving Factors  rest    Multiple Pain Sites  No         [facility] PT Assessment - 06/01/18 0001      Assessment   Medical Diagnosis  Rt shoulder slap tear    Referring Provider (PT)  Griffin Basil, MD    Onset Date/Surgical Date  --   one year onset of pain   Next MD Visit  not scheduled    Prior Therapy  PT for knee      Precautions   Precautions  None      Restrictions   Weight Bearing Restrictions  No      Balance Screen   Has  the patient fallen in the past 6 months  No      [place] residence      Prior Function   Level of Independence  Independent      Cognition   Overall Cognitive Status  Within Functional Limits for tasks assessed      Observation/Other Assessments   Focus on Therapeutic Outcomes (FOTO)   49% limited      Sensation   Light Touch  Appears Intact      Coordination   Gross Motor Movements are Fluid and Coordinated  Yes      ROM / Strength   AROM / PROM / Strength  AROM;Strength;PROM      AROM   Overall AROM Comments  Rt shoulder AROM WNL but pain starts at 110-120 deg of flexion or abduction      PROM   Overall PROM Comments  WNL      Strength   Strength Assessment Site  Shoulder    Right/Left Shoulder  Right    Right Shoulder Flexion  4+/5    Right Shoulder ABduction  4+/5    Right Shoulder Internal Rotation  4+/5    Right Shoulder External Rotation  4/5      Palpation   Palpation comment  TTP anterior and posterior shoulder      Special Tests   Other special tests  inconclusive impingment  signs, +crank test for labral tear      Ambulation/Gait   Gait Comments  WFL                Objective measurements completed on examination: See above findings.      [facility] Adult PT Treatment/Exercise - 06/01/18 0001      Modalities   Modalities  Electrical Stimulation;Cryotherapy      Cryotherapy   Number Minutes Cryotherapy  10 Minutes   with TENS   Cryotherapy Location  Shoulder    Type of Cryotherapy  Ice pack      Electrical Stimulation   Electrical Stimulation Location  Rt shoulder    Electrical Stimulation Action  IFC    Electrical Stimulation Parameters  tolerance, pt supine legs elevated    Electrical Stimulation Goals  Pain             PT Education - 06/01/18 2214    Education Details  HEP, POC,TENS    Person(s) Educated  Patient    Methods  Explanation;Demonstration;Verbal cues;Handout    Comprehension  Verbalized understanding          PT Long Term Goals - 06/01/18 2221      PT LONG TERM GOAL #1   Title  Pt will be I and compliant with HEP. 6 weeks 07/13/18    Status  New      PT LONG TERM GOAL #2   Title  Pt will improve Rt shoulder strength to 4+ to 5- MMT to improve function. 6 weeks 07/13/18    Status  New      PT LONG TERM GOAL #3   Title  Pt will reduce overall pain with ususal activity to less than 3/10. 6 weeks 07/13/18    Status  New      PT LONG TERM GOAL #4   Title  Pt will improve FOTO to less than 32% limited. 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 She wants to delay surgery and try PT first. She has decreased ROM, decreased strength, difficulty with reaching or lifting out, up, or behind her back. She will benefit from skilled PT to address her deficits.     Clinical Presentation  Evolving    Clinical Presentation due to:  up and down pain levels     Clinical Decision Making  Moderate    Rehab Potential  Fair    Clinical Impairments Affecting Rehab Potential  SLAP tear may require surgery    PT Frequency  Other (comment)   1-2   PT Duration  6 weeks    PT Treatment/Interventions  Cryotherapy;Electrical Stimulation;Iontophoresis 4mg /ml Dexamethasone;Moist Heat;Ultrasound;Therapeutic activities;Therapeutic exercise;Neuromuscular re-education;Manual techniques;Passive range of motion;Dry needling;Taping;Joint Manipulations    PT Next Visit Plan  review HEP, shoulder and scapular strength and stabilizaiton, consider modalties for pain as needed    PT Home Exercise Plan  [id number]    Consulted and Agree with Plan of Care  Patient       Patient will benefit from skilled therapeutic intervention in order to improve the following deficits and impairments:  Decreased activity tolerance, Decreased endurance, Decreased range of motion, Decreased strength, Hypomobility, Increased fascial restricitons, Increased muscle spasms, Pain  Visit Diagnosis: Chronic right shoulder pain     Problem List Patient Active Problem List   Diagnosis Date Noted  . Acute lateral meniscus tear of left knee 09/01/2012    Silvestre Mesi 06/01/2018, 10:24 PM  [facility] [address] [place], [place], [address] Phone: [phone]   Fax:  [phone]  Name: Theresa Ortiz MRN: [id number] Date of Birth: August 21, 1990

## 2018-06-08 ENCOUNTER — Encounter: Payer: Self-pay | Admitting: Physical Therapy

## 2018-06-15 ENCOUNTER — Encounter: Payer: Self-pay | Admitting: Physician Assistant

## 2018-06-15 ENCOUNTER — Ambulatory Visit (INDEPENDENT_AMBULATORY_CARE_PROVIDER_SITE_OTHER): Payer: Self-pay | Admitting: Physician Assistant

## 2018-06-15 VITALS — BP 100/84 | HR 100 | Temp 98.4°F | Resp 16 | Ht 65.0 in | Wt 246.0 lb

## 2018-06-15 DIAGNOSIS — R059 Cough, unspecified: Secondary | ICD-10-CM

## 2018-06-15 DIAGNOSIS — J101 Influenza due to other identified influenza virus with other respiratory manifestations: Secondary | ICD-10-CM

## 2018-06-15 DIAGNOSIS — R05 Cough: Secondary | ICD-10-CM

## 2018-06-15 LAB — POCT INFLUENZA A/B
Influenza A, POC: NEGATIVE
Influenza B, POC: POSITIVE — AB

## 2018-06-15 MED ORDER — BENZONATATE 100 MG PO CAPS: 100.0000 mg | ORAL_CAPSULE | Freq: Three times a day (TID) | ORAL | 0 refills | Status: DC | PRN

## 2018-06-15 MED ORDER — ALBUTEROL SULFATE HFA 108 (90 BASE) MCG/ACT IN AERS: 2.0000 | INHALATION_SPRAY | RESPIRATORY_TRACT | 0 refills | Status: DC | PRN

## 2018-06-15 MED ORDER — BALOXAVIR MARBOXIL(80 MG DOSE) 2 X 40 MG PO TBPK: 1.0000 | ORAL_TABLET | Freq: Once | ORAL | 0 refills | Status: AC

## 2018-06-15 MED ORDER — PSEUDOEPH-BROMPHEN-DM 30-2-10 MG/5ML PO SYRP: 5.0000 mL | ORAL_SOLUTION | Freq: Four times a day (QID) | ORAL | 0 refills | Status: DC | PRN

## 2018-06-15 MED ORDER — SALINE SPRAY 0.65 % NA SOLN: 1.0000 | NASAL | 0 refills | Status: DC | PRN

## 2018-06-15 MED ORDER — FLUTICASONE PROPIONATE 50 MCG/ACT NA SUSP: 2.0000 | Freq: Every day | NASAL | 0 refills | Status: DC

## 2018-06-15 NOTE — Patient Instructions (Addendum)
Influenza, Adult   You have tested positive for the flu. Please stay out of work until you are no longer contagious (I.e, 48 hours without any symptoms).  Rest, eat light meals, and stay hydrated.   Take xofluza as prescribed.   Start bromfed syrup and tessalon perles during the day.   Use nyquil at night.   Use flonase in the morning and nasal saline at night.   Use albuterol inhaler every 4-6 hours as needed for wheezing at night time.   Use over the counter ibuprofen or tylenol throughout the day as prescribed for discomfort.    Two major complications after the flu are pneumonia and sinus infections. Please be aware of this and if you are not any better in 7-10 days or you develop worsening cough or sinus pressure, seek care at local urgent care or the ED. Continue to wash your hands and wear a mask daily especially around other people.     Influenza is also called "the flu." It is an infection in the lungs, nose, and throat (respiratory tract). It is caused by a virus. The flu causes symptoms that are similar to symptoms of a cold. It also causes a high fever and body aches. The flu spreads easily from person to person (is contagious). Getting a flu shot (influenza vaccination) every year is the best way to prevent the flu. What are the causes? This condition is caused by the influenza virus. You can get the virus by:  Breathing in droplets that are in the air from the cough or sneeze of a person who has the virus.  Touching something that has the virus on it (is contaminated) and then touching your mouth, nose, or eyes. What increases the risk? Certain things may make you more likely to get the flu. These include:  Not washing your hands often.  Having close contact with many people during cold and flu season.  Touching your mouth, eyes, or nose without first washing your hands.  Not getting a flu shot every year. You may have a higher risk for the flu, along with  serious problems such as a lung infection (pneumonia), if you:  Are older than 65.  Are pregnant.  Have a weakened disease-fighting system (immune system) because of a disease or taking certain medicines.  Have a long-term (chronic) illness, such as: ? Heart, kidney, or lung disease. ? Diabetes. ? Asthma.  Have a liver disorder.  Are very overweight (morbidly obese).  Have anemia. This is a condition that affects your red blood cells. What are the signs or symptoms? Symptoms usually begin suddenly and last 4-14 days. They may include:  Fever and chills.  Headaches, body aches, or muscle aches.  Sore throat.  Cough.  Runny or stuffy (congested) nose.  Chest discomfort.  Not wanting to eat as much as normal (poor appetite).  Weakness or feeling tired (fatigue).  Dizziness.  Feeling sick to your stomach (nauseous) or throwing up (vomiting). How is this treated? If the flu is found early, you can be treated with medicine that can help reduce how bad the illness is and how long it lasts (antiviral medicine). This may be given by mouth (orally) or through an IV tube. Taking care of yourself at home can help your symptoms get better. Your doctor may suggest:  Taking over-the-counter medicines.  Drinking plenty of fluids. The flu often goes away on its own. If you have very bad symptoms or other problems, you may be treated  in a hospital. Follow these instructions at home:     Activity  Rest as needed. Get plenty of sleep.  Stay home from work or school as told by your doctor. ? Do not leave home until you do not have a fever for 24 hours without taking medicine. ? Leave home only to visit your doctor. Eating and drinking  Take an ORS (oral rehydration solution). This is a drink that is sold at pharmacies and stores.  Drink enough fluid to keep your pee (urine) pale yellow.  Drink clear fluids in small amounts as you are able. Clear fluids  include: ? Water. ? Ice chips. ? Fruit juice that has water added (diluted fruit juice). ? Low-calorie sports drinks.  Eat bland, easy-to-digest foods in small amounts as you are able. These foods include: ? Bananas. ? Applesauce. ? Rice. ? Lean meats. ? Toast. ? Crackers.  Do not eat or drink: ? Fluids that have a lot of sugar or caffeine. ? Alcohol. ? Spicy or fatty foods. General instructions  Take over-the-counter and prescription medicines only as told by your doctor.  Use a cool mist humidifier to add moisture to the air in your home. This can make it easier for you to breathe.  Cover your mouth and nose when you cough or sneeze.  Wash your hands with soap and water often, especially after you cough or sneeze. If you cannot use soap and water, use alcohol-based hand sanitizer.  Keep all follow-up visits as told by your doctor. This is important. How is this prevented?   Get a flu shot every year. You may get the flu shot in late summer, fall, or winter. Ask your doctor when you should get your flu shot.  Avoid contact with people who are sick during fall and winter (cold and flu season). Contact a doctor if:  You get new symptoms.  You have: ? Chest pain. ? Watery poop (diarrhea). ? A fever.  Your cough gets worse.  You start to have more mucus.  You feel sick to your stomach.  You throw up. Get help right away if you:  Have shortness of breath.  Have trouble breathing.  Have skin or nails that turn a bluish color.  Have very bad pain or stiffness in your neck.  Get a sudden headache.  Get sudden pain in your face or ear.  Cannot eat or drink without throwing up. Summary  Influenza ("the flu") is an infection in the lungs, nose, and throat. It is caused by a virus.  Take over-the-counter and prescription medicines only as told by your doctor.  Getting a flu shot every year is the best way to avoid getting the flu. This information is not  intended to replace advice given to you by your health care provider. Make sure you discuss any questions you have with your health care provider. Document Released: 01/23/2008 Document Revised: 10/01/2017 Document Reviewed: 10/01/2017 Elsevier Interactive Patient Education  2019 [employer].

## 2018-06-15 NOTE — Progress Notes (Signed)
MRN: [id number] DOB: May 30, 1990  Subjective:   Theresa Ortiz is a 28 y.o. female presenting for chief complaint of Cough (x3d); Nasal Congestion (x3d); and Chills (x3d) .  Reports 3 day history of sudden onset body aches, chills, dry cough, nasal congestion, and post nasal drainage. Has heard some wheezing at night time time. Has tried mucinex, sudafed, robitussin, and nyquil with some relief. Denies fever, sinus pain, inability to swallow, productive cough, shortness of breath, chest tightness and chest pain, nausea, vomiting, abdominal pain and diarrhea. Sick exposure to patients with flu as she works as Garment/textile technologist at hospital.  Hx of seasonal allergies and asthma as a child. Has not had to use inhaler in years but sometimes feels like she needs to use an inhaler. Does not want to be "dependent" on albuterol inhaler so just avoids using it. Has not follow up with asthma/allergy specialist in a while.  Patient has had flu shot this season. Former smoker, quit 2 months ago.  Denies recent travel. Denies any other aggravating or relieving factors, no other questions or concerns.  Review of Systems  Constitutional: Negative for diaphoresis.  Respiratory: Negative for hemoptysis.   Cardiovascular: Negative for palpitations.  Skin: Negative for rash.  Neurological: Negative for dizziness and headaches.    Theresa Ortiz has a current medication list which includes the following prescription(s): adderall xr, etonogestrel, meloxicam, albuterol, baloxavir marboxil(80 mg dose), benzonatate, brompheniramine-pseudoephedrine-dm, fluticasone, and sodium chloride. Also is allergic to raspberry.  Theresa Ortiz  has a past medical history of Acute meniscal tear of knee, Allergy, Asthma, Ovarian cyst, and Shingles. Also  has a past surgical history that includes RIGHT INDEX  FINGER TENDON REPAIR (NOV 2013) and Knee arthroscopy with lateral menisectomy (Left, 09/01/2012).   Objective:   Vitals: BP 100/84 (BP Location:  Right Arm, Patient Position: Sitting, Cuff Size: Large)   Pulse 100   Temp 98.4 F (36.9 C)   Resp 16   Ht 5\' 5"  (1.651 m)   Wt 246 lb (111.6 kg)   SpO2 97%   BMI 40.94 kg/m   Physical Exam Vitals signs reviewed.  Constitutional:      General: She is not in acute distress.    Appearance: She is well-developed. She is not ill-appearing or toxic-appearing.  HENT:     Head: Normocephalic and atraumatic.     Right Ear: Tympanic membrane, ear canal and external ear normal. Tympanic membrane is not erythematous or bulging.     Left Ear: Ear canal and external ear normal. A middle ear effusion is present. No mastoid tenderness. Tympanic membrane is not erythematous or bulging.     Nose: Mucosal edema (moderate b/l L>R), congestion and rhinorrhea present. Rhinorrhea is clear.     Right Sinus: No maxillary sinus tenderness or frontal sinus tenderness.     Left Sinus: No maxillary sinus tenderness or frontal sinus tenderness.     Mouth/Throat:     Lips: Pink.     Mouth: Mucous membranes are moist.     Pharynx: Uvula midline. Posterior oropharyngeal erythema present. No oropharyngeal exudate or uvula swelling.     Tonsils: No tonsillar exudate or tonsillar abscesses. Swelling: 1+ on the right. 1+ on the left.  Eyes:     Conjunctiva/sclera: Conjunctivae normal.  Neck:     Musculoskeletal: Normal range of motion.  Cardiovascular:     Rate and Rhythm: Normal rate and regular rhythm.     Heart sounds: Normal heart sounds.  Pulmonary:     Effort: Pulmonary  effort is normal.     Breath sounds: Normal breath sounds. No decreased breath sounds, wheezing, rhonchi or rales.  Lymphadenopathy:     Head:     Right side of head: No submental, submandibular, tonsillar, preauricular, posterior auricular or occipital adenopathy.     Left side of head: No submental, submandibular, tonsillar, preauricular, posterior auricular or occipital adenopathy.     Cervical: No cervical adenopathy.     Upper Body:      Right upper body: No supraclavicular adenopathy.     Left upper body: No supraclavicular adenopathy.  Skin:    General: Skin is warm and dry.  Neurological:     Mental Status: She is alert.     Results for orders placed or performed in visit on 06/15/18 (from the past 24 hour(s))  POCT Influenza A/B     Status: Abnormal   Collection Time: 06/15/18 12:30 PM  Result Value Ref Range   Influenza A, POC Negative Negative   Influenza B, POC Positive (A) Negative    Assessment and Plan :  1. Influenza B POCT influenza test positive for influenza B. Pt is afebrile. Overall well appearing, NAD. Lungs CTAB. Discussed natural history of the disease and treatment options; including supportive care and antiviral therapy. Provided pt with Rx for xofluza due to hx, former smoker, and work in healthcare.  Encouraged rest, hydration, and to continue OTC tylenol or ibuprofen as prescribed for fever. Educated on proper Comptroller. Recommended wearing a mask daily especially around other people. Remain out of work until afebrile for 48 hours w/o use of antipyretics. Educated on potential complications of the flu. Advised to follow up with family doctor, local urgent care, or ED if develops any of these concerning symptoms or if current symptoms persist outside of discussed boundaries. - Baloxavir Marboxil,80 MG Dose, 2 x 40 MG TBPK; Take 1 Dose by mouth once for 1 dose.  Dispense: 1 each; Refill: 0 - brompheniramine-pseudoephedrine-DM 30-2-10 MG/5ML syrup; Take 5 mLs by mouth 4 (four) times daily as needed.  Dispense: 120 mL; Refill: 0 - benzonatate (TESSALON) 100 MG capsule; Take 1-2 capsules (100-200 mg total) by mouth 3 (three) times daily as needed for cough.  Dispense: 40 capsule; Refill: 0 - fluticasone (FLONASE) 50 MCG/ACT nasal spray; Place 2 sprays into both nostrils daily.  Dispense: 16 g; Refill: 0 - sodium chloride (OCEAN) 0.65 % SOLN nasal spray; Place 1 spray into both nostrils as needed.   Dispense: 60 mL; Refill: 0 - albuterol (PROVENTIL HFA;VENTOLIN HFA) 108 (90 Base) MCG/ACT inhaler; Inhale 2 puffs into the lungs every 4 (four) hours as needed for wheezing or shortness of breath (cough, shortness of breath or wheezing.).  Dispense: 1 Inhaler; Refill: 0  2. Cough - POCT Influenza A/B   Tenna Delaine, PA-C  [facility] 06/15/2018 2:18 PM

## 2018-06-16 ENCOUNTER — Encounter: Payer: Self-pay | Admitting: Physical Therapy

## 2018-06-17 ENCOUNTER — Telehealth: Payer: Self-pay | Admitting: Emergency Medicine

## 2018-06-17 NOTE — Telephone Encounter (Signed)
Left message this was a follow up call on visit with [facility]

## 2018-06-18 ENCOUNTER — Encounter: Payer: Self-pay | Admitting: Physical Therapy

## 2018-06-22 ENCOUNTER — Ambulatory Visit: Payer: 59 | Admitting: Physical Therapy

## 2018-06-22 ENCOUNTER — Encounter: Payer: Self-pay | Admitting: Physical Therapy

## 2018-06-22 DIAGNOSIS — G8929 Other chronic pain: Secondary | ICD-10-CM

## 2018-06-22 DIAGNOSIS — M25511 Pain in right shoulder: Secondary | ICD-10-CM | POA: Diagnosis not present

## 2018-06-22 NOTE — Therapy (Addendum)
[place], [place], [address] Phone: [phone]   Fax:  [phone]  Physical Therapy Treatment/Discharge addendum PHYSICAL THERAPY DISCHARGE SUMMARY  Visits from Start of Care: 2  Current functional level related to goals / functional outcomes: See below   Remaining deficits: See below   Education / Equipment: HEP Plan: Patient agrees to discharge.  Patient goals were not met. Patient is being discharged due to not returning since the last visit.  ?????      Elsie Ra, PT, DPT 02/04/19 10:32 AM  Patient Details  Name: Theresa Ortiz MRN: [id number] Date of Birth: 1991-02-08 Referring Provider (PT): Griffin Basil, MD   Encounter Date: 06/22/2018  PT End of Session - 06/22/18 1727    Visit Number  2    Number of Visits  12    Date for PT Re-Evaluation  07/13/18    Authorization Type  [facility]    PT Start Time  1332    PT Stop Time  1427    PT Time Calculation (min)  55 min    Activity Tolerance  Patient tolerated treatment well    Behavior During Therapy  [facility] for tasks assessed/performed       Past Medical History:  Diagnosis Date  . Acute meniscal tear of knee    left knee  . Allergy   . Asthma   . Ovarian cyst   . Shingles     Past Surgical History:  Procedure Laterality Date  . KNEE ARTHROSCOPY WITH LATERAL MENISECTOMY Left 09/01/2012   Procedure: LEFT KNEE ARTHROSCOPY WITH LATERAL MENISCECTOMY ;  Surgeon: Tobi Bastos, MD;  Location: [place];  Service: Orthopedics;  Laterality: Left;  . RIGHT INDEX  FINGER TENDON REPAIR  NOV 2013    There were no vitals filed for this visit.  Subjective Assessment - 06/22/18 1337    Subjective  has pain with work activities.      Currently in Pain?  Yes    Pain Score  5    up to 10/10 or more   Pain Location  Shoulder    Pain Orientation  Right;Anterior    Pain Descriptors / Indicators  Sharp;Stabbing    Pain  Type  Chronic pain    Pain Frequency  Intermittent    Aggravating Factors   pushing reaching     scapular retraction,    lifting carrying,  sleeping on shoulder    a little    Pain Relieving Factors  rest  sitting shoulders forward     Effect of Pain on Daily Activities  limitd ADL                       [facility] Adult PT Treatment/Exercise - 06/22/18 0001      Exercises   Exercises  Shoulder      Shoulder Exercises: Supine   Horizontal ABduction  10 reps    Theraband Level (Shoulder Horizontal ABduction)  Level 1 (Yellow)    Flexion  5 reps;Both    Theraband Level (Shoulder Flexion)  Level 1 (Yellow)    Flexion Limitations  cued technique and for pain free motions    Diagonals  5 reps;Both    Theraband Level (Shoulder Diagonals)  Level 1 (Yellow)    Diagonals Limitations  cued pain free,  this was not pain free so stopped wien cues did not help.    Other Supine Exercises  scapular retraction with fist  squeeze  10 x      Shoulder Exercises: Prone   Retraction  10 reps;AAROM    Retraction Limitations  min assist initially  cued pain free    Flexion  5 reps;Right;AROM    Flexion Limitations  cued small range pain free,  keep shoulder away from ear nAA scapular guided    Horizontal ABduction 1  5 reps;Right;AROM    Horizontal ABduction 1 Limitations  cued      Shoulder Exercises: Standing   External Rotation  10 reps;Right;Theraband    Theraband Level (Shoulder External Rotation)  Level 2 (Red)    External Rotation Limitations  cued small range pain  free  rolled towel    Internal Rotation  10 reps    Theraband Level (Shoulder Internal Rotation)  Level 2 (Red)    Internal Rotation Limitations  cues as above    Extension  5 reps;Theraband    Theraband Level (Shoulder Extension)  Level 2 (Red)    Extension Limitations  cued technique,  smaller distance    Row  10 reps    Theraband Level (Shoulder Row)  Level 2 (Red)    Row Limitations  cued pain free      Shoulder  Exercises: Stretch   Other Shoulder Stretches  pec stretch supine,  standing painful,  shoulder rhomboid stretch 3 X 30 pain free.        Cryotherapy   Number Minutes Cryotherapy  10 Minutes    Cryotherapy Location  Shoulder    Type of Cryotherapy  --   cold pack            PT Education - 06/22/18 1727    Education Details  HEP    Person(s) Educated  Patient    Methods  Explanation;Demonstration;Tactile cues;Verbal cues    Comprehension  Verbalized understanding;Returned demonstration          PT Long Term Goals - 06/01/18 2221      PT LONG TERM GOAL #1   Title  Pt will be I and compliant with HEP. 6 weeks 07/13/18    Status  New      PT LONG TERM GOAL #2   Title  Pt will improve Rt shoulder strength to 4+ to 5- MMT to improve function. 6 weeks 07/13/18    Status  New      PT LONG TERM GOAL #3   Title  Pt will reduce overall pain with ususal activity to less than 3/10. 6 weeks 07/13/18    Status  New      PT LONG TERM GOAL #4   Title  Pt will improve FOTO to less than 32% limited. 6 weeks 07/13/18    Status  New            Plan - 06/22/18 1728    Clinical Impression Statement  Current HEP was painful.  These were modified  successfully.       PT Next Visit Plan  review HEP, shoulder and scapular strength and stabilizaiton, consider modalties for pain as needed    PT Home Exercise Plan  [id number]( IR/ER, row and shoulder extension,  doorway stretch( on hold), rhomboid stretch  - arm across body) red band now smaller motions.    Consulted and Agree with Plan of Care  Patient       Patient will benefit from skilled therapeutic intervention in order to improve the following deficits and impairments:     Visit Diagnosis: Chronic right shoulder pain  Problem List Patient Active Problem List   Diagnosis Date Noted  . Acute lateral meniscus tear of left knee 09/01/2012    ,  PTA 06/22/2018, 5:32 PM  [facility] [address] [place], [place], [address] Phone: [phone]   Fax:  [phone]  Name: Theresa Ortiz MRN: [id number] Date of Birth: 1991-01-09

## 2018-06-25 DIAGNOSIS — Z3046 Encounter for surveillance of implantable subdermal contraceptive: Secondary | ICD-10-CM | POA: Diagnosis not present

## 2018-06-29 ENCOUNTER — Ambulatory Visit: Payer: 59 | Admitting: Physical Therapy

## 2018-07-02 ENCOUNTER — Encounter: Payer: Self-pay | Admitting: Physical Therapy

## 2018-07-06 ENCOUNTER — Encounter: Payer: Self-pay | Admitting: Physical Therapy

## 2018-07-09 DIAGNOSIS — F900 Attention-deficit hyperactivity disorder, predominantly inattentive type: Secondary | ICD-10-CM | POA: Diagnosis not present

## 2018-07-09 DIAGNOSIS — J4530 Mild persistent asthma, uncomplicated: Secondary | ICD-10-CM | POA: Diagnosis not present

## 2018-07-09 DIAGNOSIS — F17200 Nicotine dependence, unspecified, uncomplicated: Secondary | ICD-10-CM | POA: Diagnosis not present

## 2018-07-09 DIAGNOSIS — R002 Palpitations: Secondary | ICD-10-CM | POA: Diagnosis not present

## 2018-07-09 MED FILL — METOPROLOL SUCCINATE ER 25: 25 | 30 days supply | Qty: 30 | Fill #0

## 2018-07-10 MED FILL — ADDERALL XR 20 MG CAP SA: 20 | 30 days supply | Qty: 30 | Fill #0

## 2018-07-10 NOTE — Progress Notes (Signed)
Subjective:   Theresa Ortiz, female    DOB: 21-Dec-1990, 28 y.o.   MRN: [id number]  Chief Complaint  Patient presents with  . New Patient (Initial Visit)  . Palpitations     Patient referred by Haywood Pao for chest heaviness.  HPI: Patient with anxiety, family history of heart disease, former tobacco use, referred to [place] for evaluation of chest pressure.  Patient reports for the last few weeks she has had episodes of intermittent chest heaviness that can occur both with exertion and at rest. Episodes can last for approximately 30 minutes. Symptoms do not stop her from her activities. She denies any palpitations, but does notice that her resting heart rate is elevated. She is unsure if this related to anxiety. She has history of scoliosis; therefore, also has chronic back pain. Has dyspnea on exertion that she attributes to her weight and deconditioning. Has not worsened recently.   She denies any history of hypertension, hyperlipidemia, or diabetes. No thyroid disorders. She was given Metoprolol by her PCP office; however, has not yet started this.  She reports that her father passed 1 year ago from heart disease. She is a former smoker that recently quit this month.     Past Medical History:  Diagnosis Date  . Acute meniscal tear of knee    left knee  . Allergy   . Anxiety   . Asthma   . Ovarian cyst   . Shingles     Past Surgical History:  Procedure Laterality Date  . KNEE ARTHROSCOPY WITH LATERAL MENISECTOMY Left 09/01/2012   Procedure: LEFT KNEE ARTHROSCOPY WITH LATERAL MENISCECTOMY ;  Surgeon: Tobi Bastos, MD;  Location: [place];  Service: Orthopedics;  Laterality: Left;  . RIGHT INDEX  FINGER TENDON REPAIR  NOV 2013    Family History  Problem Relation Age of Onset  . Hyperlipidemia Mother   . Diabetes Father   . Heart attack Father   . Hyperlipidemia Maternal Grandmother   . Hyperlipidemia Maternal Grandfather   . Cancer  Maternal Grandfather   . Hyperlipidemia Paternal Grandmother   . Heart disease Paternal Grandmother     Social History   Socioeconomic History  . Marital status: Single    Spouse name: Not on file  . Number of children: 0  . Years of education: Not on file  . Highest education level: Not on file  Occupational History  . Not on file  Social Needs  . Financial resource strain: Not on file  . Food insecurity:    Worry: Not on file    Inability: Not on file  . Transportation needs:    Medical: Not on file    Non-medical: Not on file  Tobacco Use  . Smoking status: Former Smoker    Packs/day: 0.50    Years: 3.00    Pack years: 1.50    Types: Cigarettes    Last attempt to quit: 07/12/2018  . Smokeless tobacco: Never Used  . Tobacco comment:    1 PP3D  Substance and Sexual Activity  . Alcohol use: Yes    Alcohol/week: 6.0 standard drinks    Types: 6 Standard drinks or equivalent per week    Comment: 2 times week  . Drug use: No  . Sexual activity: Not Currently  Lifestyle  . Physical activity:    Days per week: Not on file    Minutes per session: Not on file  . Stress: Not on file  Relationships  . Social connections:    Talks on phone: Not on file    Gets together: Not on file    Attends religious service: Not on file    Active member of club or organization: Not on file    Attends meetings of clubs or organizations: Not on file    Relationship status: Not on file  . Intimate partner violence:    Fear of current or ex partner: Not on file    Emotionally abused: Not on file    Physically abused: Not on file    Forced sexual activity: Not on file  Other Topics Concern  . Not on file  Social History Narrative  . Not on file    Current Outpatient Medications on File Prior to Visit  Medication Sig Dispense Refill  . ADDERALL XR 20 MG 24 hr capsule     . albuterol (PROVENTIL HFA;VENTOLIN HFA) 108 (90 Base) MCG/ACT inhaler Inhale 2 puffs into the lungs every 4  (four) hours as needed for wheezing or shortness of breath (cough, shortness of breath or wheezing.). 1 Inhaler 0  . meloxicam (MOBIC) 7.5 MG tablet daily as needed.     . metoprolol succinate (TOPROL-XL) 25 MG 24 hr tablet Take 12.5 mg by mouth daily as needed.    . naproxen sodium (ALEVE) 220 MG tablet Take 220 mg by mouth daily as needed.    . tinidazole (TINDAMAX) 500 MG tablet Take by mouth. 4 tablets a day for two days    . benzonatate (TESSALON) 100 MG capsule Take 1-2 capsules (100-200 mg total) by mouth 3 (three) times daily as needed for cough. (Patient not taking: Reported on 07/13/2018) 40 capsule 0  . brompheniramine-pseudoephedrine-DM 30-2-10 MG/5ML syrup Take 5 mLs by mouth 4 (four) times daily as needed. (Patient not taking: Reported on 07/13/2018) 120 mL 0  . etonogestrel (NEXPLANON) 68 MG IMPL implant 1 each by Subdermal route once.    . fluticasone (FLONASE) 50 MCG/ACT nasal spray Place 2 sprays into both nostrils daily. (Patient not taking: Reported on 07/13/2018) 16 g 0  . sodium chloride (OCEAN) 0.65 % SOLN nasal spray Place 1 spray into both nostrils as needed. (Patient not taking: Reported on 07/13/2018) 60 mL 0   No current facility-administered medications on file prior to visit.      Review of Systems  Constitution: Negative for decreased appetite, malaise/fatigue, weight gain and weight loss.  Eyes: Negative for visual disturbance.  Cardiovascular: Positive for chest pain and dyspnea on exertion. Negative for claudication, leg swelling, orthopnea, palpitations and syncope.  Respiratory: Negative for hemoptysis and wheezing.   Endocrine: Negative for cold intolerance and heat intolerance.  Hematologic/Lymphatic: Does not bruise/bleed easily.  Skin: Negative for nail changes.  Musculoskeletal: Positive for back pain. Negative for muscle weakness and myalgias.  Gastrointestinal: Negative for abdominal pain, change in bowel habit, nausea and vomiting.  Neurological:  Negative for difficulty with concentration, dizziness, focal weakness and headaches.  Psychiatric/Behavioral: Negative for altered mental status and suicidal ideas.  All other systems reviewed and are negative.      Objective:     Blood pressure 109/85, pulse (!) 102, height 5' 5"  (1.651 m), weight 246 lb 14.4 oz (112 kg), SpO2 98 %.  Physical Exam  Constitutional: She is oriented to person, place, and time. Vital signs are normal. She appears well-developed and well-nourished.  HENT:  Head: Normocephalic and atraumatic.  Neck: Normal range of motion.  Cardiovascular: Normal rate, regular rhythm, normal heart  sounds and intact distal pulses.  Pulmonary/Chest: Effort normal and breath sounds normal. No accessory muscle usage. No respiratory distress. She exhibits tenderness.  Abdominal: Soft. Normal appearance and bowel sounds are normal.  Musculoskeletal: Normal range of motion.  Neurological: She is alert and oriented to person, place, and time.  Skin: Skin is warm and dry.  Vitals reviewed.   Cardiac studies:   EKG 07/13/2018: Normal sinus rhythm/borderline tachycardia at 91 bpm, normal axis, no evidence of ischemia.   Recent Labs: 04/03/2018: Glucose 98, BUN/Cr 10/0.7,  eGFR 99.6, Na/K 140/4.2, AST 12, ALT 8, Alk Phos 79. Rest of CMP normal. H/H 13.6/43.4, MCV 88,  Platelets, Rest of CBC normal.  Chol 180, TG 49, HDL 56, LDL 94.      Assessment & Recommendations:   1. Atypical chest pain Suspect her chest pain is musculoskeletal related as she does have some right sided chest tenderness on palpation. May be contributed by scoliosis. Discussed using ibuprofen/tylenol and heat/ice to area as needed. Low suspicion for CAD; however, will perform treadmill stress test to evaluate her heart rate and exercise capacity. This would also provide some reassurance to her. Other differentials include GERD and anxiety.  2. Sinus tachycardia Suspect related to deconditioning. Will obtain  echocardiogram to exclude any structural abnormalities. Suspect related to deconditioning. No clinical evidence of heart failure.  3. Class 3 severe obesity due to excess calories without serious comorbidity with body mass index (BMI) of 40.0 to 44.9 in adult [facility]) Patient admits to poor diet habits. I have discussed importance of weight loss with diet modifications and weight loss.   4. Family history of heart disease Father passed from MI 1 year ago.   Plan: I will see her back after the test for further recommendations and reevaluation.  Thank you for referring this patient. Please do not hesitate to contact me for any questions.   Jeri Lager, MSN, APRN, FNP-C [facility], [place] Office: [phone] Fax: [phone]

## 2018-07-13 ENCOUNTER — Encounter: Payer: Self-pay | Admitting: Cardiology

## 2018-07-13 ENCOUNTER — Other Ambulatory Visit: Payer: Self-pay

## 2018-07-13 ENCOUNTER — Ambulatory Visit: Payer: 59 | Admitting: Cardiology

## 2018-07-13 VITALS — BP 109/85 | HR 102 | Ht 65.0 in | Wt 246.9 lb

## 2018-07-13 DIAGNOSIS — Z8249 Family history of ischemic heart disease and other diseases of the circulatory system: Secondary | ICD-10-CM | POA: Diagnosis not present

## 2018-07-13 DIAGNOSIS — Z01419 Encounter for gynecological examination (general) (routine) without abnormal findings: Secondary | ICD-10-CM | POA: Diagnosis not present

## 2018-07-13 DIAGNOSIS — R0602 Shortness of breath: Secondary | ICD-10-CM | POA: Insufficient documentation

## 2018-07-13 DIAGNOSIS — R0789 Other chest pain: Secondary | ICD-10-CM | POA: Insufficient documentation

## 2018-07-13 DIAGNOSIS — R000 Tachycardia, unspecified: Secondary | ICD-10-CM | POA: Diagnosis not present

## 2018-07-13 DIAGNOSIS — R350 Frequency of micturition: Secondary | ICD-10-CM | POA: Diagnosis not present

## 2018-07-13 DIAGNOSIS — Z6841 Body Mass Index (BMI) 40.0 and over, adult: Secondary | ICD-10-CM | POA: Diagnosis not present

## 2018-07-13 DIAGNOSIS — N760 Acute vaginitis: Secondary | ICD-10-CM | POA: Diagnosis not present

## 2018-07-13 DIAGNOSIS — R002 Palpitations: Secondary | ICD-10-CM | POA: Insufficient documentation

## 2018-07-13 MED FILL — TINIDAZOLE 500 MG TABS: 500 | 2 days supply | Qty: 8 | Fill #0

## 2018-07-23 ENCOUNTER — Other Ambulatory Visit: Payer: 59

## 2018-08-03 ENCOUNTER — Other Ambulatory Visit: Payer: 59

## 2018-08-13 ENCOUNTER — Ambulatory Visit: Payer: 59 | Admitting: Cardiology

## 2018-08-20 MED FILL — ADDERALL XR 20 MG CAP SA: 20 | 30 days supply | Qty: 30 | Fill #0

## 2018-09-28 MED FILL — ADDERALL XR 20 MG CAP SA: 20 | 30 days supply | Qty: 30 | Fill #0

## 2018-10-27 MED FILL — ADDERALL XR 20 MG CAP SA: 20 | 30 days supply | Qty: 30 | Fill #0

## 2018-11-19 DIAGNOSIS — R002 Palpitations: Secondary | ICD-10-CM | POA: Diagnosis not present

## 2018-11-26 DIAGNOSIS — F17200 Nicotine dependence, unspecified, uncomplicated: Secondary | ICD-10-CM | POA: Diagnosis not present

## 2018-11-26 DIAGNOSIS — R7301 Impaired fasting glucose: Secondary | ICD-10-CM | POA: Diagnosis not present

## 2018-11-26 DIAGNOSIS — R34 Anuria and oliguria: Secondary | ICD-10-CM | POA: Diagnosis not present

## 2018-11-26 DIAGNOSIS — F900 Attention-deficit hyperactivity disorder, predominantly inattentive type: Secondary | ICD-10-CM | POA: Diagnosis not present

## 2018-11-26 DIAGNOSIS — F331 Major depressive disorder, recurrent, moderate: Secondary | ICD-10-CM | POA: Diagnosis not present

## 2018-11-26 DIAGNOSIS — J4530 Mild persistent asthma, uncomplicated: Secondary | ICD-10-CM | POA: Diagnosis not present

## 2018-12-03 DIAGNOSIS — R3912 Poor urinary stream: Secondary | ICD-10-CM | POA: Diagnosis not present

## 2018-12-03 DIAGNOSIS — R3914 Feeling of incomplete bladder emptying: Secondary | ICD-10-CM | POA: Diagnosis not present

## 2018-12-03 MED FILL — TAMSULOSIN HCL 0.4 MG CAP: 0.4 | 30 days supply | Qty: 30 | Fill #0

## 2018-12-28 DIAGNOSIS — Z114 Encounter for screening for human immunodeficiency virus [HIV]: Secondary | ICD-10-CM | POA: Diagnosis not present

## 2018-12-28 DIAGNOSIS — B373 Candidiasis of vulva and vagina: Secondary | ICD-10-CM | POA: Diagnosis not present

## 2018-12-28 DIAGNOSIS — Z1159 Encounter for screening for other viral diseases: Secondary | ICD-10-CM | POA: Diagnosis not present

## 2018-12-28 DIAGNOSIS — R300 Dysuria: Secondary | ICD-10-CM | POA: Diagnosis not present

## 2018-12-28 DIAGNOSIS — Z113 Encounter for screening for infections with a predominantly sexual mode of transmission: Secondary | ICD-10-CM | POA: Diagnosis not present

## 2018-12-28 DIAGNOSIS — Z118 Encounter for screening for other infectious and parasitic diseases: Secondary | ICD-10-CM | POA: Diagnosis not present

## 2018-12-30 ENCOUNTER — Encounter (HOSPITAL_COMMUNITY): Payer: Self-pay

## 2018-12-30 ENCOUNTER — Ambulatory Visit (HOSPITAL_COMMUNITY)
Admission: EM | Admit: 2018-12-30 | Discharge: 2018-12-30 | Disposition: A | Discharge status: Home / Self Care | Payer: 59 | Attending: Emergency Medicine | Admitting: Emergency Medicine

## 2018-12-30 ENCOUNTER — Other Ambulatory Visit: Payer: Self-pay

## 2018-12-30 DIAGNOSIS — J029 Acute pharyngitis, unspecified: Secondary | ICD-10-CM | POA: Insufficient documentation

## 2018-12-30 DIAGNOSIS — R52 Pain, unspecified: Secondary | ICD-10-CM | POA: Diagnosis not present

## 2018-12-30 DIAGNOSIS — R6889 Other general symptoms and signs: Secondary | ICD-10-CM | POA: Insufficient documentation

## 2018-12-30 DIAGNOSIS — Z20822 Contact with and (suspected) exposure to covid-19: Secondary | ICD-10-CM

## 2018-12-30 LAB — POCT RAPID STREP A: Streptococcus, Group A Screen (Direct): NEGATIVE

## 2018-12-30 NOTE — ED Triage Notes (Signed)
Patient presents to Urgent Care with complaints of abdominal pain that has progressively worsened and is now more like generalized body pains from her toes to her neck since the past few days. Patient reports she was COVID tested today per health at work, has been having strange dreams, and chills, sweats, intermittent headaches.

## 2018-12-30 NOTE — ED Provider Notes (Signed)
[place]    CSN: [id number] Arrival date & time: 12/30/18  1455      History   Chief Complaint Chief Complaint  Patient presents with  . Abdominal Pain    HPI Theresa Ortiz is a 28 y.o. female.   Patient presents with sore throat, body aches, headache, chills, night sweats, "sore eyes", generalized weakness x1 day.  She reports she had abdominal pain yesterday which has resolved.  She reports she is having "strange dreams" and intermittent chest pain.  She states she had a COVID test this morning and her results are pending.  She states she was seen by her OB/GYN 2 days ago and started on medication for a vaginal yeast infection.  LMP: 12/13/2018.    The history is provided by the patient.    Past Medical History:  Diagnosis Date  . Acute meniscal tear of knee    left knee  . Allergy   . Anxiety   . Asthma   . Ovarian cyst   . Shingles     Patient Active Problem List   Diagnosis Date Noted  . Palpitation 07/13/2018  . Atypical chest pain 07/13/2018  . Family history of heart disease 07/13/2018  . Shortness of breath 07/13/2018  . Sinus tachycardia 07/13/2018  . Acute lateral meniscus tear of left knee 09/01/2012    Past Surgical History:  Procedure Laterality Date  . KNEE ARTHROSCOPY WITH LATERAL MENISECTOMY Left 09/01/2012   Procedure: LEFT KNEE ARTHROSCOPY WITH LATERAL MENISCECTOMY ;  Surgeon: Tobi Bastos, MD;  Location: [place];  Service: Orthopedics;  Laterality: Left;  . RIGHT INDEX  FINGER TENDON REPAIR  NOV 2013    OB History   No obstetric history on file.      Home Medications    Prior to Admission medications   Medication Sig Start Date End Date Taking? Authorizing Provider  ADDERALL XR 20 MG 24 hr capsule  05/25/18  Yes [provider]  tamsulosin (FLOMAX) 0.4 MG CAPS capsule  12/03/18  Yes [provider]  albuterol (PROVENTIL HFA;VENTOLIN HFA) 108 (90 Base) MCG/ACT inhaler Inhale 2 puffs  into the lungs every 4 (four) hours as needed for wheezing or shortness of breath (cough, shortness of breath or wheezing.). 06/15/18   Tenna Delaine D, PA-C  benzonatate (TESSALON) 100 MG capsule Take 1-2 capsules (100-200 mg total) by mouth 3 (three) times daily as needed for cough. Patient not taking: Reported on 07/13/2018 06/15/18   Tenna Delaine D, PA-C  brompheniramine-pseudoephedrine-DM 30-2-10 MG/5ML syrup Take 5 mLs by mouth 4 (four) times daily as needed. Patient not taking: Reported on 07/13/2018 06/15/18   Leonie Douglas, PA-C  etonogestrel (NEXPLANON) 68 MG IMPL implant 1 each by Subdermal route once.    [provider]  fluticasone (FLONASE) 50 MCG/ACT nasal spray Place 2 sprays into both nostrils daily. Patient not taking: Reported on 07/13/2018 06/15/18   Tenna Delaine D, PA-C  meloxicam (MOBIC) 7.5 MG tablet daily as needed.  05/21/18   [provider]  metoprolol succinate (TOPROL-XL) 25 MG 24 hr tablet Take 12.5 mg by mouth daily as needed.    [provider]  naproxen sodium (ALEVE) 220 MG tablet Take 220 mg by mouth daily as needed.    [provider]  sodium chloride (OCEAN) 0.65 % SOLN nasal spray Place 1 spray into both nostrils as needed. Patient not taking: Reported on 07/13/2018 06/15/18   Tenna Delaine D, PA-C  tinidazole [facility])  500 MG tablet Take by mouth. 4 tablets a day for two days    [provider]    Family History Family History  Problem Relation Age of Onset  . Hyperlipidemia Mother   . Diabetes Father   . Heart attack Father   . Hyperlipidemia Maternal Grandmother   . Hyperlipidemia Maternal Grandfather   . Cancer Maternal Grandfather   . Hyperlipidemia Paternal Grandmother   . Heart disease Paternal Grandmother     Social History Social History   Tobacco Use  . Smoking status: Former Smoker    Packs/day: 0.50    Years: 3.00    Pack years: 1.50    Types: Cigarettes    Quit date:  07/12/2018    Years since quitting: 0.4  . Smokeless tobacco: Never Used  . Tobacco comment:    1 PP3D  Substance Use Topics  . Alcohol use: Yes    Alcohol/week: 6.0 standard drinks    Types: 6 Standard drinks or equivalent per week    Comment: 2 times week  . Drug use: No     Allergies   Raspberry   Review of Systems Review of Systems  Constitutional: Positive for chills, diaphoresis and fatigue. Negative for fever.  HENT: Negative for congestion, ear pain, rhinorrhea and sore throat.   Eyes: Negative for pain and visual disturbance.  Respiratory: Negative for cough and shortness of breath.   Cardiovascular: Negative for chest pain and palpitations.  Gastrointestinal: Positive for abdominal pain. Negative for constipation, diarrhea, nausea and vomiting.  Genitourinary: Negative for dysuria and hematuria.  Musculoskeletal: Negative for arthralgias and back pain.  Skin: Negative for color change and rash.  Neurological: Positive for weakness and headaches. Negative for seizures and syncope.  All other systems reviewed and are negative.    Physical Exam Triage Vital Signs ED Triage Vitals  Enc Vitals Group     BP 12/30/18 1555 106/81     Pulse Rate 12/30/18 1555 (!) 106     Resp 12/30/18 1555 19     Temp 12/30/18 1555 98.6 F (37 C)     Temp Source 12/30/18 1555 Oral     SpO2 12/30/18 1555 99 %     Weight --      Height --      Head Circumference --      Peak Flow --      Pain Score 12/30/18 1550 7     Pain Loc --      Pain Edu? --      Excl. in [place]? --    No data found.  Updated Vital Signs BP 106/81 (BP Location: Left Arm)   Pulse (!) 106   Temp 98.6 F (37 C) (Oral)   Resp 19   SpO2 99%   Visual Acuity Right Eye Distance:   Left Eye Distance:   Bilateral Distance:    Right Eye Near:   Left Eye Near:    Bilateral Near:     Physical Exam Vitals signs and nursing note reviewed.  Constitutional:      General: She is not in acute distress.     Appearance: She is well-developed.  HENT:     Head: Normocephalic and atraumatic.     Right Ear: Tympanic membrane normal.     Left Ear: Tympanic membrane normal.     Nose: Nose normal.     Mouth/Throat:     Mouth: Mucous membranes are moist.     Pharynx: Oropharynx is clear.  Eyes:     Conjunctiva/sclera: Conjunctivae normal.  Neck:     Musculoskeletal: Neck supple.  Cardiovascular:     Rate and Rhythm: Normal rate and regular rhythm.     Heart sounds: No murmur.  Pulmonary:     Effort: Pulmonary effort is normal. No respiratory distress.     Breath sounds: Normal breath sounds.  Abdominal:     General: Bowel sounds are normal.     Palpations: Abdomen is soft.     Tenderness: There is no abdominal tenderness. There is no right CVA tenderness, left CVA tenderness, guarding or rebound.  Skin:    General: Skin is warm and dry.     Findings: No bruising, erythema or rash.  Neurological:     General: No focal deficit present.     Mental Status: She is alert and oriented to person, place, and time.     Cranial Nerves: No cranial nerve deficit.     Sensory: No sensory deficit.     Motor: No weakness.     Coordination: Coordination normal.     Gait: Gait normal.     Deep Tendon Reflexes: Reflexes normal.      UC Treatments / Results  Labs (all labs ordered are listed, but only abnormal results are displayed) Labs Reviewed  CULTURE, GROUP A STREP [facility])  POCT RAPID STREP A    EKG   Radiology No results found.  Procedures Procedures (including critical care time)  Medications Ordered in UC Medications - No data to display  Initial Impression / Assessment and Plan / UC Course  I have reviewed the triage vital signs and the nursing notes.  Pertinent labs & imaging results that were available during my care of the patient were reviewed by me and considered in my medical decision making (see chart for details).    Sore throat, body aches, suspected COVID.  Patient  had COVID test performed this morning and results pending.  Rapid strep negative; culture pending.  Instructed patient to self quarantine until her COVID test result is back.  Instructed her to go to the emergency department if she has worsening symptoms or has new symptoms such as high fever, shortness of breath, severe diarrhea.  Patient agrees with plan of care.     Final Clinical Impressions(s) / UC Diagnoses   Final diagnoses:  Sore throat  Body aches  Suspected Covid-19 Virus Infection     Discharge Instructions     Your rapid strep test was negative.  Your throat culture will be sent and we will call you if the result is positive requiring treatment.  You should self quarantine until your test result is back.    Go to the emergency department if you develop worsening symptoms or new symptoms such as shortness of breath, high fever, or severe diarrhea.        ED Prescriptions    None     Controlled Substance Prescriptions [place] Controlled Substance Registry consulted? Not Applicable   Sharion Balloon, NP 12/30/18 1652

## 2018-12-30 NOTE — Discharge Instructions (Signed)
Your rapid strep test was negative.  Your throat culture will be sent and we will call you if the result is positive requiring treatment.  You should self quarantine until your test result is back.    Go to the emergency department if you develop worsening symptoms or new symptoms such as shortness of breath, high fever, or severe diarrhea.

## 2019-01-02 LAB — CULTURE, GROUP A STREP (THRC)

## 2019-01-07 DIAGNOSIS — R3912 Poor urinary stream: Secondary | ICD-10-CM | POA: Diagnosis not present

## 2019-01-07 DIAGNOSIS — R3914 Feeling of incomplete bladder emptying: Secondary | ICD-10-CM | POA: Diagnosis not present

## 2019-01-14 DIAGNOSIS — M2242 Chondromalacia patellae, left knee: Secondary | ICD-10-CM | POA: Diagnosis not present

## 2019-01-14 DIAGNOSIS — M2241 Chondromalacia patellae, right knee: Secondary | ICD-10-CM | POA: Diagnosis not present

## 2019-01-14 MED FILL — MELOXICAM 15 MG TABLET: 15 | 30 days supply | Qty: 30 | Fill #0

## 2019-01-16 ENCOUNTER — Other Ambulatory Visit: Payer: Self-pay

## 2019-01-16 ENCOUNTER — Telehealth: Payer: 59 | Admitting: Family

## 2019-01-16 ENCOUNTER — Ambulatory Visit (HOSPITAL_COMMUNITY)
Admission: EM | Admit: 2019-01-16 | Discharge: 2019-01-16 | Disposition: A | Discharge status: Home / Self Care | Payer: 59 | Attending: Emergency Medicine | Admitting: Emergency Medicine

## 2019-01-16 ENCOUNTER — Encounter (HOSPITAL_COMMUNITY): Payer: Self-pay

## 2019-01-16 DIAGNOSIS — R0789 Other chest pain: Secondary | ICD-10-CM

## 2019-01-16 DIAGNOSIS — T63304A Toxic effect of unspecified spider venom, undetermined, initial encounter: Secondary | ICD-10-CM

## 2019-01-16 DIAGNOSIS — L03115 Cellulitis of right lower limb: Secondary | ICD-10-CM | POA: Diagnosis not present

## 2019-01-16 HISTORY — DX: Tachycardia, unspecified: R00.0

## 2019-01-16 MED ORDER — DOXYCYCLINE HYCLATE 100 MG PO CAPS: 100.0000 mg | ORAL_CAPSULE | Freq: Two times a day (BID) | ORAL | 0 refills | Status: DC

## 2019-01-16 MED ORDER — DOXYCYCLINE HYCLATE 100 MG PO CAPS: 100.0000 mg | ORAL_CAPSULE | Freq: Two times a day (BID) | ORAL | 0 refills | Status: AC

## 2019-01-16 NOTE — ED Triage Notes (Addendum)
Pt presents with complaints of an irritated spot on her right upper leg that she noticed yesterday. Pt states she felt very flushed yesterday. Pt also states that she is feeling anxious and having pressure in her chest. Pt was supposed to see a cardiologist for tachycardia and has not been able to see them.

## 2019-01-16 NOTE — Discharge Instructions (Signed)
Take the medication as prescribed.  Watch the area closely over the next 24 to 48 hours.  If the redness spreads despite taking the antibiotics please follow-up.  Use the warm compresses

## 2019-01-16 NOTE — ED Provider Notes (Signed)
[facility]    CSN: [id number] Arrival date & time: 01/16/19  1522      History   Chief Complaint Chief Complaint  Patient presents with  . Animal Bite    HPI Theresa Ortiz is a 28 y.o. female.   Patient is a 28 year old female past medical history of allergy, anxiety, asthma.  Theresa Ortiz presents today with redness, swelling to the right upper inner thigh.  Theresa Ortiz noticed this yesterday.  Worried that a spider may have bit her.  Theresa Ortiz has had some centered drainage.  Reporting some feelings of flushes yesterday.  Theresa Ortiz has been very anxious about the situation.  Recently moved and has been getting a lot of boxes at a storage.  Has noticed some spiders in her house.  No recorded fevers at home.  Theresa Ortiz does not have a thermometer.  No nausea, vomiting or chills.  No numbness, tingling or weakness in extremities.  Reporting the redness has doubled in the last 24 hours.  ROS per HPI      Past Medical History:  Diagnosis Date  . Acute meniscal tear of knee    left knee  . Allergy   . Anxiety   . Asthma   . Ovarian cyst   . Shingles   . Tachycardia     Patient Active Problem List   Diagnosis Date Noted  . Palpitation 07/13/2018  . Atypical chest pain 07/13/2018  . Family history of heart disease 07/13/2018  . Shortness of breath 07/13/2018  . Sinus tachycardia 07/13/2018  . Acute lateral meniscus tear of left knee 09/01/2012    Past Surgical History:  Procedure Laterality Date  . KNEE ARTHROSCOPY WITH LATERAL MENISECTOMY Left 09/01/2012   Procedure: LEFT KNEE ARTHROSCOPY WITH LATERAL MENISCECTOMY ;  Surgeon: Tobi Bastos, MD;  Location: [facility];  Service: Orthopedics;  Laterality: Left;  . RIGHT INDEX  FINGER TENDON REPAIR  NOV 2013    OB History   No obstetric history on file.      Home Medications    Prior to Admission medications   Medication Sig Start Date End Date Taking? Authorizing Provider  mirabegron ER (MYRBETRIQ) 25 MG TB24  tablet Take 25 mg by mouth daily.   Yes [provider]  ADDERALL XR 20 MG 24 hr capsule  05/25/18   [provider]  albuterol (PROVENTIL HFA;VENTOLIN HFA) 108 (90 Base) MCG/ACT inhaler Inhale 2 puffs into the lungs every 4 (four) hours as needed for wheezing or shortness of breath (cough, shortness of breath or wheezing.). 06/15/18   Tenna Delaine D, PA-C  doxycycline (VIBRAMYCIN) 100 MG capsule Take 1 capsule (100 mg total) by mouth 2 (two) times daily for 7 days. 01/16/19 01/23/19  Loura Halt A, NP  etonogestrel (NEXPLANON) 68 MG IMPL implant 1 each by Subdermal route once.    [provider]  meloxicam (MOBIC) 7.5 MG tablet daily as needed.  05/21/18   [provider]  metoprolol succinate (TOPROL-XL) 25 MG 24 hr tablet Take 12.5 mg by mouth daily as needed.    [provider]  naproxen sodium (ALEVE) 220 MG tablet Take 220 mg by mouth daily as needed.    [provider]  tamsulosin (FLOMAX) 0.4 MG CAPS capsule  12/03/18   [provider]  tinidazole (TINDAMAX) 500 MG tablet Take by mouth. 4 tablets a day for two days    [provider]  fluticasone (FLONASE) 50 MCG/ACT nasal spray Place 2 sprays into  both nostrils daily. Patient not taking: Reported on 07/13/2018 06/15/18 01/16/19  Tenna Delaine D, PA-C  sodium chloride (OCEAN) 0.65 % SOLN nasal spray Place 1 spray into both nostrils as needed. Patient not taking: Reported on 07/13/2018 06/15/18 01/16/19  Leonie Douglas, PA-C    Family History Family History  Problem Relation Age of Onset  . Hyperlipidemia Mother   . Diabetes Father   . Heart attack Father   . Hyperlipidemia Maternal Grandmother   . Hyperlipidemia Maternal Grandfather   . Cancer Maternal Grandfather   . Hyperlipidemia Paternal Grandmother   . Heart disease Paternal Grandmother     Social History Social History   Tobacco Use  . Smoking status: Former Smoker    Packs/day: 0.50    Years:  3.00    Pack years: 1.50    Types: Cigarettes    Quit date: 07/12/2018    Years since quitting: 0.5  . Smokeless tobacco: Never Used  . Tobacco comment:    1 PP3D  Substance Use Topics  . Alcohol use: Yes    Alcohol/week: 6.0 standard drinks    Types: 6 Standard drinks or equivalent per week    Comment: 2 times week  . Drug use: No     Allergies   Raspberry   Review of Systems Review of Systems   Physical Exam Triage Vital Signs ED Triage Vitals  Enc Vitals Group     BP 01/16/19 1612 125/69     Pulse Rate 01/16/19 1612 100     Resp 01/16/19 1612 18     Temp 01/16/19 1612 98 F (36.7 C)     Temp src --      SpO2 01/16/19 1612 100 %     Weight --      Height --      Head Circumference --      Peak Flow --      Pain Score 01/16/19 1607 8     Pain Loc --      Pain Edu? --      Excl. in [place]? --    No data found.  Updated Vital Signs BP 125/69   Pulse 100   Temp 98 F (36.7 C)   Resp 18   SpO2 100%   Visual Acuity Right Eye Distance:   Left Eye Distance:   Bilateral Distance:    Right Eye Near:   Left Eye Near:    Bilateral Near:     Physical Exam Vitals signs and nursing note reviewed.  Constitutional:      General: Theresa Ortiz is not in acute distress.    Appearance: Normal appearance. Theresa Ortiz is not ill-appearing, toxic-appearing or diaphoretic.  HENT:     Head: Normocephalic and atraumatic.     Nose: Nose normal.  Eyes:     Conjunctiva/sclera: Conjunctivae normal.  Neck:     Musculoskeletal: Normal range of motion.  Pulmonary:     Effort: Pulmonary effort is normal.  Musculoskeletal: Normal range of motion.  Skin:    General: Skin is warm and dry.     Findings: Erythema present.     Comments: Abscess with surrounding erythema and induration.  No fluctuance.  Very tender to touch.   See picture for detail  Neurological:     Mental Status: Theresa Ortiz is alert.  Psychiatric:        Mood and Affect: Mood normal.        UC Treatments / Results  Labs  (all labs ordered are  listed, but only abnormal results are displayed) Labs Reviewed - No data to display  EKG   Radiology No results found.  Procedures Procedures (including critical care time)  Medications Ordered in UC Medications - No data to display  Initial Impression / Assessment and Plan / UC Course  I have reviewed the triage vital signs and the nursing notes.  Pertinent labs & imaging results that were available during my care of the patient were reviewed by me and considered in my medical decision making (see chart for details).     Treating for cellulitis most likely due to some sort of insect bite.  Not sure if this is a spider bite. Treating with doxycycline.  Recommended that if symptoms worsen in the redness spreads over the next 24 to 48 hours Theresa Ortiz will need to come in for recheck.  Recommended warm compresses in case there is some sort of abscess forming. Final Clinical Impressions(s) / UC Diagnoses   Final diagnoses:  Cellulitis of right lower extremity     Discharge Instructions     Take the medication as prescribed.  Watch the area closely over the next 24 to 48 hours.  If the redness spreads despite taking the antibiotics please follow-up.  Use the warm compresses    ED Prescriptions    Medication Sig Dispense Auth. Provider   doxycycline (VIBRAMYCIN) 100 MG capsule  (Status: Discontinued) Take 1 capsule (100 mg total) by mouth 2 (two) times daily. 20 capsule Telvin Reinders A, NP   doxycycline (VIBRAMYCIN) 100 MG capsule Take 1 capsule (100 mg total) by mouth 2 (two) times daily for 7 days. 14 capsule Eamon Tantillo A, NP     PDMP not reviewed this encounter.   Orvan July, NP 01/17/19 1032

## 2019-01-16 NOTE — Progress Notes (Signed)
Based on what you shared with me, I feel your condition warrants further evaluation and I recommend that you be seen for a face to face office visit.  Given you are having tightness in your throat or chest you need to be seen immediately face to face.   NOTE: If you entered your credit card information for this eVisit, you will not be charged. You may see a "hold" on your card for the $35 but that hold will drop off and you will not have a charge processed.  If you are having a true medical emergency please call 911.     For an urgent face to face visit, [facility] has four urgent care centers for your convenience:   . [facility]    [phone]                  Get Driving Directions  [phone] [place], [place] [address] . 10 am to 8 pm Monday-Friday . 12 pm to 8 pm Saturday-Sunday   . [facility] [place]                  Get Driving Directions  [phone] [place], [place], [place] [address] . 8 am to 8 pm Monday-Friday . 9 am to 6 pm Saturday . 11 am to 6 pm Sunday   . [facility] [place]                  Get Driving Directions   [address] [place], [place] [address] . 8 am to 8 pm Monday-Friday . 8 am to 4 pm Saturday-Sunday    . [facility] [place]                    Get Driving Directions  [id number]  [address], [place], [place] [address]  . Monday-Friday, 12 PM to 6 PM    Your e-visit answers were reviewed by a board certified advanced clinical practitioner to complete your personal care plan.  Thank you for using e-Visits.

## 2019-01-17 ENCOUNTER — Ambulatory Visit (HOSPITAL_COMMUNITY)
Admission: EM | Admit: 2019-01-17 | Discharge: 2019-01-17 | Disposition: A | Discharge status: Home / Self Care | Payer: 59 | Attending: Emergency Medicine | Admitting: Emergency Medicine

## 2019-01-17 ENCOUNTER — Encounter (HOSPITAL_COMMUNITY): Payer: Self-pay

## 2019-01-17 DIAGNOSIS — L03115 Cellulitis of right lower limb: Secondary | ICD-10-CM

## 2019-01-17 MED ORDER — AMOXICILLIN-POT CLAVULANATE 875-125 MG PO TABS: 1.0000 | ORAL_TABLET | Freq: Two times a day (BID) | ORAL | 0 refills | Status: AC

## 2019-01-17 MED ORDER — CEFTRIAXONE SODIUM 1 G IJ SOLR
INTRAMUSCULAR | Status: AC
  Filled 2019-01-17: qty 10

## 2019-01-17 MED ORDER — CEFTRIAXONE SODIUM 1 G IJ SOLR
1.0000 g | Freq: Once | INTRAMUSCULAR | Status: AC
  Administered 2019-01-17: 1 g via INTRAMUSCULAR

## 2019-01-17 NOTE — ED Triage Notes (Signed)
Pt was here yesterday. Pt states she here for a wound check on her right thigh. Pt states the wound is redder today from yesterday.

## 2019-01-17 NOTE — Discharge Instructions (Addendum)
Continue warm compresses.  Continue the doxycycline in addition to the Augmentin.  You may take 600 mg of ibuprofen combined with 1 g of Tylenol 3 or 4 times a day as needed for pain.  Go immediately to the ER for fevers, body aches, if you start to get worse, if the redness starts to extend beyond the mark that we made today.

## 2019-01-17 NOTE — ED Provider Notes (Signed)
HPI  SUBJECTIVE:  Theresa Ortiz is a 28 y.o. female who presents with worsening cellulitis of her right medial upper thigh.  She states that this started off 3 days ago as a "bump" and she scratched it.  She reports erythema of increasing size, swelling, burning intermittent minutes long pain alternating with constant throbbing pain.  She does not recall any trauma to the area other than her scratching it.  She is not sure if this is a spider bite, but states that she is moving and has seen more spiders than usual around her house.  No fevers, body aches, drainage, contacts with MRSA.  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  She reports occasional crampy abdominal pain but attributes this to gas. Seen here yesterday for this, was started on doxycycline.  States that she is getting worse despite taking this.  She has also tried warm compresses.  Symptoms are worse with palpation, movement, sitting, standing.  No alleviating factors.  Past medical history negative for diabetes, MRSA, prosthetic heart or joint valves.  LMP: 9/14.  Denies possibility being pregnant.  PMD: Tisovec, Fransico Him, MD   Past Medical History:  Diagnosis Date  . Acute meniscal tear of knee    left knee  . Allergy   . Anxiety   . Asthma   . Ovarian cyst   . Shingles   . Tachycardia     Past Surgical History:  Procedure Laterality Date  . KNEE ARTHROSCOPY WITH LATERAL MENISECTOMY Left 09/01/2012   Procedure: LEFT KNEE ARTHROSCOPY WITH LATERAL MENISCECTOMY ;  Surgeon: Tobi Bastos, MD;  Location: [facility];  Service: Orthopedics;  Laterality: Left;  . RIGHT INDEX  FINGER TENDON REPAIR  NOV 2013    Family History  Problem Relation Age of Onset  . Hyperlipidemia Mother   . Diabetes Father   . Heart attack Father   . Hyperlipidemia Maternal Grandmother   . Hyperlipidemia Maternal Grandfather   . Cancer Maternal Grandfather   . Hyperlipidemia Paternal Grandmother   . Heart disease Paternal  Grandmother     Social History   Tobacco Use  . Smoking status: Former Smoker    Packs/day: 0.50    Years: 3.00    Pack years: 1.50    Types: Cigarettes    Quit date: 07/12/2018    Years since quitting: 0.5  . Smokeless tobacco: Never Used  . Tobacco comment:    1 PP3D  Substance Use Topics  . Alcohol use: Yes    Alcohol/week: 6.0 standard drinks    Types: 6 Standard drinks or equivalent per week    Comment: 2 times week  . Drug use: No    No current facility-administered medications for this encounter.   Current Outpatient Medications:  .  ADDERALL XR 20 MG 24 hr capsule, , Disp: , Rfl:  .  albuterol (PROVENTIL HFA;VENTOLIN HFA) 108 (90 Base) MCG/ACT inhaler, Inhale 2 puffs into the lungs every 4 (four) hours as needed for wheezing or shortness of breath (cough, shortness of breath or wheezing.)., Disp: 1 Inhaler, Rfl: 0 .  amoxicillin-clavulanate (AUGMENTIN) 875-125 MG tablet, Take 1 tablet by mouth 2 (two) times daily for 7 days., Disp: 14 tablet, Rfl: 0 .  doxycycline (VIBRAMYCIN) 100 MG capsule, Take 1 capsule (100 mg total) by mouth 2 (two) times daily for 7 days., Disp: 14 capsule, Rfl: 0 .  etonogestrel (NEXPLANON) 68 MG IMPL implant, 1 each by Subdermal route once., Disp: , Rfl:  .  meloxicam (MOBIC) 7.5 MG tablet, daily as needed. , Disp: , Rfl:  .  metoprolol succinate (TOPROL-XL) 25 MG 24 hr tablet, Take 12.5 mg by mouth daily as needed., Disp: , Rfl:  .  mirabegron ER (MYRBETRIQ) 25 MG TB24 tablet, Take 25 mg by mouth daily., Disp: , Rfl:  .  naproxen sodium (ALEVE) 220 MG tablet, Take 220 mg by mouth daily as needed., Disp: , Rfl:  .  tamsulosin (FLOMAX) 0.4 MG CAPS capsule, , Disp: , Rfl:  .  tinidazole (TINDAMAX) 500 MG tablet, Take by mouth. 4 tablets a day for two days, Disp: , Rfl:   Allergies  Allergen Reactions  . Raspberry Swelling    TONGUE SWELLS     ROS  As noted in HPI.   Physical Exam  BP 105/68 (BP Location: Right Arm)   Pulse 100   Temp  97.9 F (36.6 C) (Oral)   Resp 18   Wt 110.7 kg   SpO2 99%   BMI 40.60 kg/m   Constitutional: Well developed, well nourished, no acute distress Eyes:  EOMI, conjunctiva normal bilaterally HENT: Normocephalic, atraumatic,mucus membranes moist Respiratory: Normal inspiratory effort Cardiovascular: Normal rate GI: nondistended Skin: 15 x 7.5 cm tender area of erythema with central induration right upper medial thigh.  No central fluctuance.  Marked area of erythema with a marker and dated it for today.    Musculoskeletal: no deformities Neurologic: Alert & oriented x 3, no focal neuro deficits Psychiatric: Speech and behavior appropriate   ED Course   Medications  cefTRIAXone (ROCEPHIN) injection 1 g (1 g Intramuscular Given 01/17/19 1526)  cefTRIAXone (ROCEPHIN) 1 g injection (has no administration in time range)    No orders of the defined types were placed in this encounter.   No results found for this or any previous visit (from the past 24 hour(s)). No results found.  ED Clinical Impression  1. Cellulitis of right lower extremity      ED Assessment/Plan Previous records reviewed.  As noted in HPI.  This is much worse than yesterday.  We will give 1 g of Rocephin.  We will have her continue the doxycycline.  Will start Augmentin 875 mg p.o. twice daily for 7 days in addition to continuing the doxycycline.  There is nothing to I&D today.  Advised her that if she gets worse in the next 24 hours she will need to go to the emergency department.  In the differential is black widow envenomation, brown recluse envenomation.   Discussed  MDM, treatment plan, and plan for follow-up with patient. Discussed sn/sx that should prompt return to the ED. patient agrees with plan.   Meds ordered this encounter  Medications  . cefTRIAXone (ROCEPHIN) injection 1 g  . amoxicillin-clavulanate (AUGMENTIN) 875-125 MG tablet    Sig: Take 1 tablet by mouth 2 (two) times daily for 7 days.     Dispense:  14 tablet    Refill:  0    *This clinic note was created using [employer]. Therefore, there may be occasional mistakes despite careful proofreading.   ?    Melynda Ripple, MD 01/17/19 1535

## 2019-01-18 ENCOUNTER — Other Ambulatory Visit: Payer: Self-pay

## 2019-01-18 ENCOUNTER — Emergency Department (HOSPITAL_COMMUNITY): Admission: EM | Admit: 2019-01-18 | Discharge: 2019-01-18 | Discharge status: Absent without leave | Payer: 59

## 2019-01-18 DIAGNOSIS — Z79899 Other long term (current) drug therapy: Secondary | ICD-10-CM | POA: Diagnosis not present

## 2019-01-18 DIAGNOSIS — J45909 Unspecified asthma, uncomplicated: Secondary | ICD-10-CM | POA: Insufficient documentation

## 2019-01-18 DIAGNOSIS — L03115 Cellulitis of right lower limb: Secondary | ICD-10-CM | POA: Insufficient documentation

## 2019-01-18 DIAGNOSIS — Z87891 Personal history of nicotine dependence: Secondary | ICD-10-CM | POA: Diagnosis not present

## 2019-01-18 LAB — COMPREHENSIVE METABOLIC PANEL
ALT: 13 U/L (ref 0–44)
AST: 15 U/L (ref 15–41)
Albumin: 4.1 g/dL (ref 3.5–5.0)
Alkaline Phosphatase: 66 U/L (ref 38–126)
Anion gap: 12 (ref 5–15)
BUN: 12 mg/dL (ref 6–20)
CO2: 23 mmol/L (ref 22–32)
Calcium: 9.2 mg/dL (ref 8.9–10.3)
Chloride: 103 mmol/L (ref 98–111)
Creatinine, Ser: 0.78 mg/dL (ref 0.44–1.00)
GFR calc Af Amer: >60 mL/min (ref >60)
GFR calc non Af Amer: >60 mL/min (ref >60)
Glucose, Bld: 141 mg/dL — ABNORMAL HIGH (ref 70–99)
Potassium: 3.4 mmol/L — ABNORMAL LOW (ref 3.5–5.1)
Sodium: 138 mmol/L (ref 135–145)
Total Bilirubin: 0.6 mg/dL (ref 0.3–1.2)
Total Protein: 7.5 g/dL (ref 6.5–8.1)

## 2019-01-18 LAB — CBC
HCT: 37.8 % (ref 36.0–46.0)
Hemoglobin: 12.5 g/dL (ref 12.0–15.0)
MCH: 28.2 pg (ref 26.0–34.0)
MCHC: 33.1 g/dL (ref 30.0–36.0)
MCV: 85.3 fL (ref 80.0–100.0)
Platelets: 282 10*3/uL (ref 150–400)
RBC: 4.43 MIL/uL (ref 3.87–5.11)
RDW: 13.2 % (ref 11.5–15.5)
WBC: 10.5 10*3/uL (ref 4.0–10.5)
nRBC: 0 % (ref 0.0–0.2)

## 2019-01-18 NOTE — ED Triage Notes (Signed)
Pt noted with cellulitis to right inner thigh since lat Friday, and was put on macrobid Saturday, and then given IM antibiotics Sunday. States still getting worse, no fever but has felt flushed.

## 2019-01-19 ENCOUNTER — Emergency Department
Admission: EM | Admit: 2019-01-19 | Discharge: 2019-01-19 | Disposition: A | Discharge status: Home / Self Care | Payer: 59 | Attending: Emergency Medicine | Admitting: Emergency Medicine

## 2019-01-19 DIAGNOSIS — L03115 Cellulitis of right lower limb: Secondary | ICD-10-CM

## 2019-01-19 MED ORDER — DEXAMETHASONE SODIUM PHOSPHATE 10 MG/ML IJ SOLN
12.0000 mg | Freq: Once | INTRAMUSCULAR | Status: AC
  Administered 2019-01-19: 12 mg via INTRAMUSCULAR
  Filled 2019-01-19: qty 2

## 2019-01-19 NOTE — Discharge Instructions (Addendum)
As we discussed, at this point I do not think he would benefit from inpatient admission and IV antibiotics.  You need a little bit more time for the antibiotics you are on to work.  Please complete the full course of both antibiotics.  You were given a one-time dose of steroids (Decadron) which should help with the redness and inflammation.  He may continue to spread a little bit more over the next 1 to 2 days but you should start noticing an improvement, just as the pain has already improved.  If you develop any new or worsening symptoms, such as fever, vomiting, redness that is spreading up higher on your leg towards your body or extending down lower to or beyond your knee, please return immediately to the emergency department.

## 2019-01-19 NOTE — ED Provider Notes (Signed)
[facility] Emergency Department Provider Note  ____________________________________________   First MD Initiated Contact with Patient 01/19/19 0040     (approximate)  I have reviewed the triage vital signs and the nursing notes.   HISTORY  Chief Complaint Cellulitis    HPI Theresa Ortiz is a 28 y.o. female with medical history as listed below presents for evaluation of persistent cellulitis to her right inner thigh.  She has been to the urgent care twice in the last couple of days.  The symptoms started about 3 days ago and she is not certain why but she notes that she had clothing that was rubbing on a inner part of her leg and started as a small firm area that is now at the center of the area of redness.  She went to urgent care and first received a course of doxycycline.  She subsequently went back when the redness continued to spread and was put on Augmentin in addition to the doxycycline and was given an intramuscular dose of ceftriaxone.  The area of cellulitis was marked each time and she was told to come back if the redness continued to spread.  By today she reports that the pain has improved substantially.  She was able to work and the pain was severe previously but is now mild and only to the touch.  She has had no systemic symptoms including no fever/chills, sore throat, chest pain, shortness of breath, cough, vomiting, or dysuria.  She has had a little bit of nausea but she attributes that to anxiety over the situation and at baseline.  She has had a little bit of diarrhea which started after beginning antibiotics.  In spite of the lack of systemic symptoms and improvement in pain, she reports that the redness has continued to spread beyond the marker border by a couple of centimeters.  She called urgent care and was told to come to the emergency department.  She currently describes the redness as moderate to severe although she has no other symptoms  and the pain is only mild.        Past Medical History:  Diagnosis Date  . Acute meniscal tear of knee    left knee  . Allergy   . Anxiety   . Asthma   . Ovarian cyst   . Shingles   . Tachycardia     Patient Active Problem List   Diagnosis Date Noted  . Palpitation 07/13/2018  . Atypical chest pain 07/13/2018  . Family history of heart disease 07/13/2018  . Shortness of breath 07/13/2018  . Sinus tachycardia 07/13/2018  . Acute lateral meniscus tear of left knee 09/01/2012    Past Surgical History:  Procedure Laterality Date  . KNEE ARTHROSCOPY WITH LATERAL MENISECTOMY Left 09/01/2012   Procedure: LEFT KNEE ARTHROSCOPY WITH LATERAL MENISCECTOMY ;  Surgeon: Tobi Bastos, MD;  Location: [facility];  Service: Orthopedics;  Laterality: Left;  . RIGHT INDEX  FINGER TENDON REPAIR  NOV 2013    Prior to Admission medications   Medication Sig Start Date End Date Taking? Authorizing Provider  ADDERALL XR 20 MG 24 hr capsule  05/25/18   [provider]  albuterol (PROVENTIL HFA;VENTOLIN HFA) 108 (90 Base) MCG/ACT inhaler Inhale 2 puffs into the lungs every 4 (four) hours as needed for wheezing or shortness of breath (cough, shortness of breath or wheezing.). 06/15/18   Tenna Delaine D, PA-C  amoxicillin-clavulanate (AUGMENTIN) 875-125 MG tablet Take 1 tablet  by mouth 2 (two) times daily for 7 days. 01/17/19 01/24/19  Melynda Ripple, MD  doxycycline (VIBRAMYCIN) 100 MG capsule Take 1 capsule (100 mg total) by mouth 2 (two) times daily for 7 days. 01/16/19 01/23/19  Loura Halt A, NP  etonogestrel (NEXPLANON) 68 MG IMPL implant 1 each by Subdermal route once.    [provider]  meloxicam (MOBIC) 7.5 MG tablet daily as needed.  05/21/18   [provider]  metoprolol succinate (TOPROL-XL) 25 MG 24 hr tablet Take 12.5 mg by mouth daily as needed.    [provider]  mirabegron ER (MYRBETRIQ) 25 MG TB24 tablet Take 25 mg by mouth  daily.    [provider]  naproxen sodium (ALEVE) 220 MG tablet Take 220 mg by mouth daily as needed.    [provider]  tamsulosin (FLOMAX) 0.4 MG CAPS capsule  12/03/18   [provider]  tinidazole (TINDAMAX) 500 MG tablet Take by mouth. 4 tablets a day for two days    [provider]  fluticasone (FLONASE) 50 MCG/ACT nasal spray Place 2 sprays into both nostrils daily. Patient not taking: Reported on 07/13/2018 06/15/18 01/16/19  Tenna Delaine D, PA-C  sodium chloride (OCEAN) 0.65 % SOLN nasal spray Place 1 spray into both nostrils as needed. Patient not taking: Reported on 07/13/2018 06/15/18 01/16/19  Leonie Douglas, PA-C    Allergies Raspberry  Family History  Problem Relation Age of Onset  . Hyperlipidemia Mother   . Diabetes Father   . Heart attack Father   . Hyperlipidemia Maternal Grandmother   . Hyperlipidemia Maternal Grandfather   . Cancer Maternal Grandfather   . Hyperlipidemia Paternal Grandmother   . Heart disease Paternal Grandmother     Social History Social History   Tobacco Use  . Smoking status: Former Smoker    Packs/day: 0.50    Years: 3.00    Pack years: 1.50    Types: Cigarettes    Quit date: 07/12/2018    Years since quitting: 0.5  . Smokeless tobacco: Never Used  . Tobacco comment:    1 PP3D  Substance Use Topics  . Alcohol use: Yes    Alcohol/week: 6.0 standard drinks    Types: 6 Standard drinks or equivalent per week    Comment: 2 times week  . Drug use: No    Review of Systems Constitutional: No fever/chills Eyes: No visual changes. ENT: No sore throat. Cardiovascular: Denies chest pain. Respiratory: Denies shortness of breath. Gastrointestinal: No abdominal pain.  Nausea, no vomiting.  No diarrhea.  No constipation. Genitourinary: Negative for dysuria. Musculoskeletal: Negative for neck pain.  Negative for back pain. Integumentary: Area of redness/cellulitis in the right inner thigh as  described above. Neurological: Negative for headaches, focal weakness or numbness.   ____________________________________________   PHYSICAL EXAM:  VITAL SIGNS: ED Triage Vitals [01/18/19 2158]  Enc Vitals Group     BP (!) 139/93     Pulse Rate (!) 109     Resp 20     Temp 98.1 F (36.7 C)     Temp Source Oral     SpO2 97 %     Weight 110.7 kg (244 lb)     Height 1.651 m (5\' 5" )     Head Circumference      Peak Flow      Pain Score 6     Pain Loc      Pain Edu?      Excl. in [place]?  Constitutional: Alert and oriented.  No acute distress, well-appearing. Eyes: Conjunctivae are normal.  Head: Atraumatic. Nose: No congestion/rhinnorhea. Mouth/Throat: Mucous membranes are moist. Neck: No stridor.  No meningeal signs.   Cardiovascular: Normal rate, regular rhythm. Good peripheral circulation. Grossly normal heart sounds. Respiratory: Normal respiratory effort.  No retractions. Gastrointestinal: Soft and nontender. No distention.  Musculoskeletal: No lower extremity tenderness nor edema. No gross deformities of extremities. Neurologic:  Normal speech and language. No gross focal neurologic deficits are appreciated.  Skin:  Skin is warm, dry, and intact.  She has an area of what appears to be cellulitis on her right inner thigh that is approximately 17 cm in diameter, slightly larger than that which was marked at urgent care yesterday.  It is warm to the touch and only minimally tender.  There are no red marks extending out either distally or proximally from the area of cellulitis; the area is well-circumscribed and contained and nonradiating.  There is one small indurated "bump" in the center that is likely the original source but there is no fluctuance and no surrounding fluctuance nor induration. Psychiatric: Mood and affect are normal. Speech and behavior are normal.  ____________________________________________   LABS (all labs ordered are listed, but only abnormal results  are displayed)  Labs Reviewed  COMPREHENSIVE METABOLIC PANEL - Abnormal; Notable for the following components:      Result Value   Potassium 3.4 (*)    Glucose, Bld 141 (*)    All other components within normal limits  CBC   ____________________________________________  EKG  None - EKG not ordered by ED physician ____________________________________________  RADIOLOGY Ursula Alert, personally viewed and evaluated these images (plain radiographs) as part of my medical decision making, as well as reviewing the written report by the radiologist.  ED MD interpretation: No indication for imaging  Official radiology report(s): No results found.  ____________________________________________   PROCEDURES   Procedure(s) performed (including Critical Care):  Procedures   ____________________________________________   INITIAL IMPRESSION / MDM / [place] / ED COURSE  As part of my medical decision making, I reviewed the following data within the [facility] notes reviewed and incorporated, Labs reviewed , Old chart reviewed and Notes from prior ED visits   Differential diagnosis includes, but is not limited to, worsening cellulitis, incomplete treatment of cellulitis, failure of outpatient treatment and antibiotics, abscess, necrotizing fasciitis.  The patient is well-appearing and in no distress.  She reports that she always has a slightly elevated heart rate and overall she feels well, she only came in because she was told to do so.  She was uncertain how much the redness will continue to spread in spite of antibiotics.  She is compliant with her doxycycline and Augmentin and received the intramuscular ceftriaxone recently.  She has not received any antibiotics.  I gave the patient the option of admitting her to the hospital for IV antibiotics but I explained that I think she just needs more time to be treated.  Clinically the symptoms are  improving in terms of the pain and she has no systemic symptoms, and with the start of cellulitis the redness and inflammation can continue to spread for a couple of days.  Overall she is well-appearing and has no leukocytosis and a normal metabolic panel.  Even though the area is slightly larger it does not include 50% of her extremity.  She would prefer not to be admitted if it is not necessary and at  this point I truly do not believe it is necessary.  I am giving her a dose of dexamethasone 15 mg intramuscular and I explained that this should theoretically help with the inflammation and spreading of the erythema while the antibiotics are working.  She is already on appropriate antibiotic therapy and I will not change them at this time.  I recommended she come back if she starts developing systemic symptoms or if the redness is still spreading or showing signs of moving proximally after a couple more days.  She is comfortable with this plan.          ____________________________________________  FINAL CLINICAL IMPRESSION(S) / ED DIAGNOSES  Final diagnoses:  Cellulitis of right thigh     MEDICATIONS GIVEN DURING THIS VISIT:  Medications  dexamethasone (DECADRON) injection 12 mg (has no administration in time range)     ED Discharge Orders    None      *Please note:  Theresa Ortiz was evaluated in Emergency Department on 01/19/2019 for the symptoms described in the history of present illness. She was evaluated in the context of the global COVID-19 pandemic, which necessitated consideration that the patient might be at risk for infection with the SARS-CoV-2 virus that causes COVID-19. Institutional protocols and algorithms that pertain to the evaluation of patients at risk for COVID-19 are in a state of rapid change based on information released by regulatory bodies including the CDC and federal and state organizations. These policies and algorithms were followed during the patient's  care in the ED.  Some ED evaluations and interventions may be delayed as a result of limited staffing during the pandemic.*  Note:  This document was prepared using Dragon voice recognition software and may include unintentional dictation errors.   Hinda Kehr, MD 01/19/19 [phone]

## 2019-01-20 ENCOUNTER — Telehealth: Payer: Self-pay

## 2019-01-20 NOTE — Telephone Encounter (Signed)
Pt called and said that her Heart rate has been in the high 120's and she is having chest tightness. Since Friday. She was in ER and is being treated for possible cellulitis.  Her Urologist took her off of her Metoprolol approximately three months because ut interfered with her bladder medicine. She asked if there was something you could give her that could also help with her high anxiety, please advise.

## 2019-01-20 NOTE — Telephone Encounter (Signed)
It looks like I only saw her one time and recommended some test that were never performed, I'm guessing due to pandemic. Looks like her PCP gave her the Metoprolol, not [place]. I would need to see her before I make any recommendations. She will need to discuss with her PCP about the anxiety medications

## 2019-01-21 NOTE — Telephone Encounter (Signed)
Lvm for pt tell her AK recommendations

## 2019-01-22 DIAGNOSIS — R197 Diarrhea, unspecified: Secondary | ICD-10-CM | POA: Diagnosis not present

## 2019-01-22 DIAGNOSIS — R002 Palpitations: Secondary | ICD-10-CM | POA: Diagnosis not present

## 2019-01-22 DIAGNOSIS — F17200 Nicotine dependence, unspecified, uncomplicated: Secondary | ICD-10-CM | POA: Diagnosis not present

## 2019-01-22 DIAGNOSIS — R0789 Other chest pain: Secondary | ICD-10-CM | POA: Diagnosis not present

## 2019-01-22 DIAGNOSIS — L03115 Cellulitis of right lower limb: Secondary | ICD-10-CM | POA: Diagnosis not present

## 2019-01-22 DIAGNOSIS — K219 Gastro-esophageal reflux disease without esophagitis: Secondary | ICD-10-CM | POA: Insufficient documentation

## 2019-01-22 DIAGNOSIS — F419 Anxiety disorder, unspecified: Secondary | ICD-10-CM | POA: Diagnosis not present

## 2019-01-22 DIAGNOSIS — F900 Attention-deficit hyperactivity disorder, predominantly inattentive type: Secondary | ICD-10-CM | POA: Diagnosis not present

## 2019-02-17 NOTE — Progress Notes (Signed)
Cardiology Office Note:   Date:  02/18/2019  NAME:  Theresa Ortiz    MRN: [id number] DOB:  16-Mar-1991   PCP:  Haywood Pao, MD  Cardiologist:  Evalina Field, MD  Electrophysiologist:  None   Referring MD: Haywood Pao, MD   Chief Complaint  Patient presents with   Chest Pain    History of Present Illness:   Theresa Ortiz is a 28 y.o. female with a hx of anxiety, obesity, tobacco abuse who is being seen today for the evaluation of chest pain at the request of Tisovec, Fransico Him, MD.  She reports she is had on and off chest pain and palpitation for years.  Over the past several months her symptoms are worsening.  This is resulted in several emergency room visits.  She reports her work-up has not been taking seriously and she is concerned about this.  She reports she gets several episodes, 5-6, of chest pain.  These episodes occur daily.  She reports that they can occur at rest or with exercise.  They are described as sharp at times dull at times, and can also be a squeezing pressure.  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  The pain can last seconds to days.  She apparently had a recent episode that lasted nearly 5 days.  The pain was constant.  She reports no association with position does not appear to be worse with lying flat or sitting forward.  She also reports she gets 3 times per day episodes of palpitations.  They can last seconds to minutes.  They can also occur without any identifiable trigger.  Her EKG does demonstrate PACs.  Regarding cardiovascular risk factors she has a history of CAD in her father.  He died of a heart attack in his 57s.  She also smokes and has done so on and off for about 5 years.  She is trying to quit.  She enjoys smoking in the car.  She was evaluated by cardiology in March who recommended a stress test and echocardiogram but this was not done due to the coronavirus pandemic.  She reports she is ready to proceed  with this work-up.  Of note she was started on mirabegron by her urologist and she was unable to start metoprolol.  He had concerns with drug interaction. .  Past Medical History: Past Medical History:  Diagnosis Date   Acute meniscal tear of knee    left knee   Allergy    Anxiety    Asthma    Ovarian cyst    Shingles    Tachycardia     Past Surgical History: Past Surgical History:  Procedure Laterality Date   KNEE ARTHROSCOPY WITH LATERAL MENISECTOMY Left 09/01/2012   Procedure: LEFT KNEE ARTHROSCOPY WITH LATERAL MENISCECTOMY ;  Surgeon: Tobi Bastos, MD;  Location: [facility];  Service: Orthopedics;  Laterality: Left;   RIGHT INDEX  FINGER TENDON REPAIR  NOV 2013    Current Medications: Current Meds  Medication Sig   ADDERALL XR 20 MG 24 hr capsule    albuterol (PROVENTIL HFA;VENTOLIN HFA) 108 (90 Base) MCG/ACT inhaler Inhale 2 puffs into the lungs every 4 (four) hours as needed for wheezing or shortness of breath (cough, shortness of breath or wheezing.).   meloxicam (MOBIC) 7.5 MG tablet daily as needed.    mirabegron ER (MYRBETRIQ) 25 MG TB24 tablet Take 50 mg by mouth daily.  naproxen sodium (ALEVE) 220 MG tablet Take 220 mg by mouth daily as needed.     Allergies:    Raspberry   Social History: Social History   Socioeconomic History   Marital status: Single    Spouse name: Not on file   Number of children: 0   Years of education: Not on file   Highest education level: Not on file  Occupational History   Not on file  Social Needs   Financial resource strain: Not on file   Food insecurity    Worry: Not on file    Inability: Not on file   Transportation needs    Medical: Not on file    Non-medical: Not on file  Tobacco Use   Smoking status: Current Every Day Smoker    Packs/day: 0.50    Years: 5.00    Pack years: 2.50    Types: Cigarettes    Last attempt to quit: 07/12/2018    Years since quitting: 0.6    Smokeless tobacco: Never Used   Tobacco comment:    1 PP3D  Substance and Sexual Activity   Alcohol use: Yes    Alcohol/week: 6.0 standard drinks    Types: 6 Standard drinks or equivalent per week    Comment: 2 times week   Drug use: No   Sexual activity: Not Currently  Lifestyle   Physical activity    Days per week: Not on file    Minutes per session: Not on file   Stress: Not on file  Relationships   Social connections    Talks on phone: Not on file    Gets together: Not on file    Attends religious service: Not on file    Active member of club or organization: Not on file    Attends meetings of clubs or organizations: Not on file    Relationship status: Not on file  Other Topics Concern   Not on file  Social History Narrative   Not on file     Family History: The patient's family history includes Asthma in her sister; Cancer in her maternal grandfather; Diabetes in her father; Heart attack (age of onset: 48) in her father; Heart disease (age of onset: 56) in her paternal grandmother; Hyperlipidemia in her maternal grandfather, maternal grandmother, mother, and paternal grandmother.  ROS:   All other ROS reviewed and negative. Pertinent positives noted in the HPI.     EKGs/Labs/Other Studies Reviewed:   The following studies were personally reviewed by me today:  EKG:  EKG is ordered today.  The ekg ordered today demonstrates normal sinus rhythm, heart rate 77, PACs noted, no acute ischemic changes, no evidence of prior infarction, and was personally reviewed by me.   Recent Labs: 01/18/2019: ALT 13; BUN 12; Creatinine, Ser 0.78; Hemoglobin 12.5; Platelets 282; Potassium 3.4; Sodium 138   Recent Lipid Panel No results found for: CHOL, TRIG, HDL, CHOLHDL, VLDL, LDLCALC, LDLDIRECT  Physical Exam:   VS:  BP 112/80    Pulse 77    Ht 5\' 5"  (1.651 m)    Wt 247 lb (112 kg)    BMI 41.10 kg/m    Wt Readings from Last 3 Encounters:  02/18/19 247 lb (112 kg)  01/18/19  244 lb (110.7 kg)  01/17/19 244 lb (110.7 kg)    General: Well nourished, well developed, in no acute distress Heart: Atraumatic, normal size  Eyes: PEERLA, EOMI  Neck: Supple, no JVD Endocrine: No thryomegaly Cardiac: Normal S1, S2;  RRR; no murmurs, rubs, or gallops Lungs: Clear to auscultation bilaterally, no wheezing, rhonchi or rales  Abd: Soft, nontender, no hepatomegaly  Ext: No edema, pulses 2+ Musculoskeletal: No deformities, BUE and BLE strength normal and equal Skin: Warm and dry, no rashes   Neuro: Alert and oriented to person, place, time, and situation, CNII-XII grossly intact, no focal deficits  Psych: Normal mood and affect   ASSESSMENT:   Theresa Ortiz is a 28 y.o. female who presents for the following: 1. Precordial pain   2. Palpitations   3. PAC (premature atrial contraction)   4. Tobacco abuse   5. Class 3 severe obesity due to excess calories without serious comorbidity with body mass index (BMI) of 40.0 to 44.9 in adult [facility])     PLAN:   1. Precordial pain -She presents with rather atypical chest pain.  Given her age and really lack of significant risk factors other than tobacco abuse, I have a low suspicion for CAD.  I think it is reasonable to proceed with a exercise treadmill stress test just to ensure there are no ischemic EKG changes and this will further reassure her her heart is healthy. -We will also obtain an echocardiogram to ensure heart is normal and functioning well  2. Palpitations 3. PAC (premature atrial contraction) -She has daily episodes of palpitations and has PACs on her EKG today.  No symptoms reported, so I suspect they may be unrelated.  We will proceed with a 7-day ZIO patch to determine how much of these she is having.  We will also keep a symptom log to determine if these are symptomatic.  I will hold off on any medication as this could interact with her mirabegron on.  I suspect maybe diltiazem will be better for her if this is  the case.  We will await this work-up.  4. Tobacco abuse -Counseled on the importance of smoking cessation  5. Obesity, BMI >40 -I will discuss with her the possibility of sleep apnea and her next visit.  This could explain the PACs.  Disposition: Return in about 3 months (around 05/21/2019).  Medication Adjustments/Labs and Tests Ordered: Current medicines are reviewed at length with the patient today.  Concerns regarding medicines are outlined above.  Orders Placed This Encounter  Procedures   Exercise Tolerance Test   LONG TERM MONITOR-LIVE TELEMETRY (3-14 DAYS)   EKG 12-Lead   ECHOCARDIOGRAM COMPLETE   No orders of the defined types were placed in this encounter.   Patient Instructions  Medication Instructions:  NO CHANGE *If you need a refill on your cardiac medications before your next appointment, please call your pharmacy*  Lab Work: If you have labs (blood work) drawn today and your tests are completely normal, you will receive your results only by:  [facility] (if you have MyChart) OR  A paper copy in the mail If you have any lab test that is abnormal or we need to change your treatment, we will call you to review the results.  Testing/Procedures: Your physician has requested that you have an echocardiogram. Echocardiography is a painless test that uses sound waves to create images of your heart. It provides your doctor with information about the size and shape of your heart and how well your hearts chambers and valves are working. This procedure takes approximately one hour. There are no restrictions for this procedure.[facility] has requested that you have an exercise tolerance test. For further information please  visit [email or web address]. Please also follow instruction sheet, as given.    7 DAY ZIO MONITOR WILL BE MAILED TO YOU  Follow-Up: At [facility], you and your health needs are our priority.  As part of our  continuing mission to provide you with exceptional heart care, we have created designated Provider Care Teams.  These Care Teams include your primary Cardiologist (physician) and Advanced Practice Providers (APPs -  Physician Assistants and Nurse Practitioners) who all work together to provide you with the care you need, when you need it.  Your next appointment:   3 months  The format for your next appointment:   In Person  Provider:   Eleonore Chiquito, MD      Signed, Addison Naegeli. Audie Box, [place]  [address] [place],  [address] [phone]  02/18/2019 10:07 AM

## 2019-02-18 ENCOUNTER — Ambulatory Visit: Payer: 59 | Admitting: Cardiovascular Disease

## 2019-02-18 ENCOUNTER — Telehealth: Payer: Self-pay | Admitting: Cardiovascular Disease

## 2019-02-18 ENCOUNTER — Encounter: Payer: Self-pay | Admitting: Cardiovascular Disease

## 2019-02-18 ENCOUNTER — Other Ambulatory Visit: Payer: Self-pay

## 2019-02-18 VITALS — BP 112/80 | HR 77 | Ht 65.0 in | Wt 247.0 lb

## 2019-02-18 DIAGNOSIS — R002 Palpitations: Secondary | ICD-10-CM | POA: Diagnosis not present

## 2019-02-18 DIAGNOSIS — Z720 Tobacco use: Secondary | ICD-10-CM

## 2019-02-18 DIAGNOSIS — Z6841 Body Mass Index (BMI) 40.0 and over, adult: Secondary | ICD-10-CM

## 2019-02-18 DIAGNOSIS — R072 Precordial pain: Secondary | ICD-10-CM | POA: Diagnosis not present

## 2019-02-18 DIAGNOSIS — I491 Atrial premature depolarization: Secondary | ICD-10-CM

## 2019-02-18 NOTE — Telephone Encounter (Signed)
Updated in system

## 2019-02-18 NOTE — Patient Instructions (Signed)
Medication Instructions:  NO CHANGE *If you need a refill on your cardiac medications before your next appointment, please call your pharmacy*  Lab Work: If you have labs (blood work) drawn today and your tests are completely normal, you will receive your results only by: Marland Kitchen MyChart Message (if you have MyChart) OR . A paper copy in the mail If you have any lab test that is abnormal or we need to change your treatment, we will call you to review the results.  Testing/Procedures: Your physician has requested that you have an echocardiogram. Echocardiography is a painless test that uses sound waves to create images of your heart. It provides your doctor with information about the size and shape of your heart and how well your heart's chambers and valves are working. This procedure takes approximately one hour. There are no restrictions for this procedure.[facility]k has requested that you have an exercise tolerance test. For further information please visit [email or web address]. Please also follow instruction sheet, as given.    7 DAY ZIO MONITOR WILL BE MAILED TO YOU  Follow-Up: At [facility], you and your health needs are our priority.  As part of our continuing mission to provide you with exceptional heart care, we have created designated Provider Care Teams.  These Care Teams include your primary Cardiologist (physician) and Advanced Practice Providers (APPs -  Physician Assistants and Nurse Practitioners) who all work together to provide you with the care you need, when you need it.  Your next appointment:   3 months  The format for your next appointment:   In Person  Provider:   Eleonore Chiquito, MD

## 2019-02-18 NOTE — Telephone Encounter (Signed)
New Message     Pt is calling and wants to update her address for the heart monitor to be mailed    [address]  [place] [address]    Please Call with questions

## 2019-02-25 ENCOUNTER — Other Ambulatory Visit: Payer: Self-pay

## 2019-02-25 ENCOUNTER — Ambulatory Visit (HOSPITAL_COMMUNITY): Payer: 59 | Attending: Cardiovascular Disease

## 2019-02-25 DIAGNOSIS — R002 Palpitations: Secondary | ICD-10-CM | POA: Diagnosis not present

## 2019-02-26 ENCOUNTER — Ambulatory Visit
Admission: RE | Admit: 2019-02-26 | Discharge: 2019-02-26 | Disposition: A | Discharge status: Home / Self Care | Payer: 59 | Source: Ambulatory Visit | Attending: Cardiovascular Disease | Admitting: Cardiovascular Disease

## 2019-02-26 ENCOUNTER — Other Ambulatory Visit: Payer: Self-pay | Admitting: Cardiovascular Disease

## 2019-02-26 ENCOUNTER — Other Ambulatory Visit: Payer: Self-pay

## 2019-02-26 DIAGNOSIS — R079 Chest pain, unspecified: Secondary | ICD-10-CM

## 2019-03-01 ENCOUNTER — Other Ambulatory Visit: Payer: Self-pay | Admitting: *Deleted

## 2019-03-01 ENCOUNTER — Telehealth: Payer: Self-pay | Admitting: *Deleted

## 2019-03-01 DIAGNOSIS — R002 Palpitations: Secondary | ICD-10-CM

## 2019-03-01 NOTE — Telephone Encounter (Signed)
7 day ZIO XT long term holter monitor to be mailed to [facility] [place], [place], [place] [address]

## 2019-03-11 ENCOUNTER — Telehealth (HOSPITAL_COMMUNITY): Payer: Self-pay

## 2019-03-11 NOTE — Telephone Encounter (Signed)
Encounter complete. 

## 2019-03-12 ENCOUNTER — Other Ambulatory Visit (HOSPITAL_COMMUNITY)
Admission: RE | Admit: 2019-03-12 | Discharge: 2019-03-12 | Disposition: A | Discharge status: Home / Self Care | Payer: 59 | Source: Ambulatory Visit | Attending: Cardiovascular Disease | Admitting: Cardiovascular Disease

## 2019-03-12 ENCOUNTER — Other Ambulatory Visit: Payer: Self-pay

## 2019-03-12 DIAGNOSIS — Z20828 Contact with and (suspected) exposure to other viral communicable diseases: Secondary | ICD-10-CM | POA: Diagnosis not present

## 2019-03-12 DIAGNOSIS — Z01812 Encounter for preprocedural laboratory examination: Secondary | ICD-10-CM | POA: Insufficient documentation

## 2019-03-14 LAB — NOVEL CORONAVIRUS, NAA (HOSP ORDER, SEND-OUT TO REF LAB; TAT 18-24 HRS): SARS-CoV-2, NAA: NOT DETECTED

## 2019-03-16 ENCOUNTER — Other Ambulatory Visit: Payer: Self-pay

## 2019-03-16 ENCOUNTER — Ambulatory Visit (HOSPITAL_COMMUNITY)
Admission: RE | Admit: 2019-03-16 | Discharge: 2019-03-16 | Disposition: A | Discharge status: Home / Self Care | Payer: 59 | Source: Ambulatory Visit | Attending: Cardiovascular Disease | Admitting: Cardiovascular Disease

## 2019-03-16 DIAGNOSIS — R072 Precordial pain: Secondary | ICD-10-CM | POA: Insufficient documentation

## 2019-03-16 LAB — EXERCISE TOLERANCE TEST
Estimated workload: 10.2 METS
Exercise duration (min): 9 min
Exercise duration (sec): 0 s
MPHR: 191 {beats}/min
Peak HR: 179 {beats}/min
Percent HR: 93 %
Rest HR: 80 {beats}/min

## 2019-04-14 ENCOUNTER — Telehealth: Payer: Self-pay | Admitting: Cardiovascular Disease

## 2019-04-14 NOTE — Telephone Encounter (Signed)
Theresa Ortiz is calling from [employer], she states that the patient would like to go on a low dose of birth control. Patient states that she spoke with someone from the office who said that was ok. [facility] will need a note from our office stating that this is ok beings that the patient was referred for chest pain.   Fax number: [phone]

## 2019-05-16 NOTE — Progress Notes (Signed)
Cardiology Office Note:   Date:  05/17/2019  NAME:  Theresa Ortiz    MRN: [id number] DOB:  1991/02/08   PCP:  Haywood Pao, MD  Cardiologist:  Evalina Field, MD  Electrophysiologist:  None   Referring MD: Haywood Pao, MD   Chief Complaint  Patient presents with  . Follow-up    3 months.  . Headache    Blurred vision.  . Chest Pain    In office after walking to car.   History of Present Illness:   Theresa Ortiz is a 29 y.o. female with a hx of anxiety, obesity, tobacco abuse who presents for follow-up.  Evaluated for palpitations and chest pain.  Normal echocardiogram and normal exercise treadmill stress test.  A Holter monitor was ordered but this was not performed.  She reports she still gets episodic episodes of chest pain and anxiety.  She also gets palpitations.  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  I informed her this will be okay.  She has it with her today and will start wearing it today.  She reports every 2 days she can get 1-2 episodes of palpitations.  They do occur with anxiety or at work.  They also occur with stressful situations with family.  She also gets intermittent episodes of sharp chest pain.  Similar in quality lasting a few minutes can occur with rest or exercise.  Last several minutes and resolved without any alleviating factors.  I went over her results of her normal echocardiogram as well as normal exercise ECG treadmill stress test.  I think this reassured her that her heart is not a major issue here.  We would like to go ahead and get the monitor completed to make sure there is no significant arrhythmia.  She has started to exercise and has lost 4 pounds.  She is also quit smoking.  I encouraged her to remain quit.  Past Medical History: Past Medical History:  Diagnosis Date  . Acute meniscal tear of knee    left knee  . Allergy   . Anxiety   . Asthma   . Ovarian  cyst   . Shingles   . Tachycardia     Past Surgical History: Past Surgical History:  Procedure Laterality Date  . KNEE ARTHROSCOPY WITH LATERAL MENISECTOMY Left 09/01/2012   Procedure: LEFT KNEE ARTHROSCOPY WITH LATERAL MENISCECTOMY ;  Surgeon: Tobi Bastos, MD;  Location: [facility];  Service: Orthopedics;  Laterality: Left;  . RIGHT INDEX  FINGER TENDON REPAIR  NOV 2013    Current Medications: Current Meds  Medication Sig  . ADDERALL XR 20 MG 24 hr capsule   . albuterol (PROVENTIL HFA;VENTOLIN HFA) 108 (90 Base) MCG/ACT inhaler Inhale 2 puffs into the lungs every 4 (four) hours as needed for wheezing or shortness of breath (cough, shortness of breath or wheezing.).  Marland Kitchen etonogestrel (NEXPLANON) 68 MG IMPL implant 1 each by Subdermal route once.  . meloxicam (MOBIC) 7.5 MG tablet daily as needed.   . mirabegron ER (MYRBETRIQ) 25 MG TB24 tablet Take 50 mg by mouth daily.   . naproxen sodium (ALEVE) 220 MG tablet Take 220 mg by mouth daily as needed.  . [DISCONTINUED] tamsulosin (FLOMAX) 0.4 MG CAPS capsule      Allergies:    Raspberry   Social History: Social History   Socioeconomic History  . Marital status: Single  Spouse name: Not on file  . Number of children: 0  . Years of education: Not on file  . Highest education level: Not on file  Occupational History  . Not on file  Tobacco Use  . Smoking status: Current Every Day Smoker    Packs/day: 0.50    Years: 5.00    Pack years: 2.50    Types: Cigarettes    Last attempt to quit: 07/12/2018    Years since quitting: 0.8  . Smokeless tobacco: Never Used  . Tobacco comment:    1 PP3D  Substance and Sexual Activity  . Alcohol use: Yes    Alcohol/week: 6.0 standard drinks    Types: 6 Standard drinks or equivalent per week    Comment: 2 times week  . Drug use: No  . Sexual activity: Not Currently  Other Topics Concern  . Not on file  Social History Narrative  . Not on file   Social Determinants  of Health   Financial Resource Strain:   . Difficulty of Paying Living Expenses: Not on file  Food Insecurity:   . Worried About Charity fundraiser in the Last Year: Not on file  . Ran Out of Food in the Last Year: Not on file  Transportation Needs:   . Lack of Transportation (Medical): Not on file  . Lack of Transportation (Non-Medical): Not on file  Physical Activity:   . Days of Exercise per Week: Not on file  . Minutes of Exercise per Session: Not on file  Stress:   . Feeling of Stress : Not on file  Social Connections:   . Frequency of Communication with Friends and Family: Not on file  . Frequency of Social Gatherings with Friends and Family: Not on file  . Attends Religious Services: Not on file  . Active Member of Clubs or Organizations: Not on file  . Attends Archivist Meetings: Not on file  . Marital Status: Not on file     Family History: The patient's family history includes Asthma in her sister; Cancer in her maternal grandfather; Diabetes in her father; Heart attack (age of onset: 43) in her father; Heart disease (age of onset: 15) in her paternal grandmother; Hyperlipidemia in her maternal grandfather, maternal grandmother, mother, and paternal grandmother.  ROS:   All other ROS reviewed and negative. Pertinent positives noted in the HPI.     EKGs/Labs/Other Studies Reviewed:   The following studies were personally reviewed by me today:  EKG:  EKG is ordered today.  The ekg ordered today demonstrates normal sinus rhythm, heart 83, no acute ST-T changes, no evidence of prior infarction, normal EKG, and was personally reviewed by me.   TTE 02/25/2019  1. Left ventricular ejection fraction, by visual estimation, is 60 to 65%. The left ventricle has normal function. There is no left ventricular hypertrophy.  2. Global right ventricle has normal systolic function.The right ventricular size is normal. No increase in right ventricular wall thickness.  3. Left  atrial size was normal.  4. Right atrial size was normal.  5. The mitral valve is normal in structure. Trace mitral valve regurgitation. No evidence of mitral stenosis.  6. The tricuspid valve is normal in structure. Tricuspid valve regurgitation is trivial.  7. The aortic valve is normal in structure. Aortic valve regurgitation is not visualized.  8. The pulmonic valve was normal in structure. Pulmonic valve regurgitation is not visualized.  9. The atrial septum is grossly normal.  ETT 03/16/2019  9:00 min:s 10.2 METS Negative for ischemia   Recent Labs: 01/18/2019: ALT 13; BUN 12; Creatinine, Ser 0.78; Hemoglobin 12.5; Platelets 282; Potassium 3.4; Sodium 138   Recent Lipid Panel No results found for: CHOL, TRIG, HDL, CHOLHDL, VLDL, LDLCALC, LDLDIRECT  Physical Exam:   VS:  BP 100/72 (BP Location: Left Arm, Patient Position: Sitting, Cuff Size: Large)   Pulse 83   Temp (!) 95.5 F (35.3 C)   Ht 5\' 5"  (1.651 m)   Wt 250 lb (113.4 kg)   BMI 41.60 kg/m    Wt Readings from Last 3 Encounters:  05/17/19 250 lb (113.4 kg)  02/18/19 247 lb (112 kg)  01/18/19 244 lb (110.7 kg)    General: Well nourished, well developed, in no acute distress Heart: Atraumatic, normal size  Eyes: PEERLA, EOMI  Neck: Supple, no JVD Endocrine: No thryomegaly Cardiac: Normal S1, S2; RRR; no murmurs, rubs, or gallops Lungs: Clear to auscultation bilaterally, no wheezing, rhonchi or rales  Abd: Soft, nontender, no hepatomegaly  Ext: No edema, pulses 2+ Musculoskeletal: No deformities, BUE and BLE strength normal and equal Skin: Warm and dry, no rashes   Neuro: Alert and oriented to person, place, time, and situation, CNII-XII grossly intact, no focal deficits  Psych: Normal mood and affect   ASSESSMENT:   Theresa Ortiz is a 29 y.o. female who presents for the following: 1. Palpitations   2. Atypical chest pain     PLAN:   1. Palpitations -Normal echo and normal exercise treadmill stress  test.  Her thyroid studies are normal at her last visit. -She will complete her 7-day ZIO patch and we will exclude any arrhythmia here.  We will follow-up the results by phone.  If her results are abnormal she will see [place] in office. -We will also go ahead and start her on Prozac 10 mg daily at her request.  She will have her primary care physician follow-up this and provide refills.  2. Atypical chest pain -Normal echo and normal exercise treadmill stress test.  I suspect this is all anxiety related.  She also has come to the conclusion.  Disposition: Return if symptoms worsen or fail to improve.  Medication Adjustments/Labs and Tests Ordered: Current medicines are reviewed at length with the patient today.  Concerns regarding medicines are outlined above.  Orders Placed This Encounter  Procedures  . EKG 12-Lead   Meds ordered this encounter  Medications  . FLUoxetine (PROZAC) 10 MG capsule    Sig: Take 1 capsule (10 mg total) by mouth daily.    Dispense:  30 capsule    Refill:  0    Patient Instructions  Medication Instructions:  Start Prozac 10 mg *If you need a refill on your cardiac medications before your next appointment, please call your pharmacy*  Testing/Procedures: Wear ZIO- and we will call you with results.  Follow-Up: At [facility], you and your health needs are our priority.  As part of our continuing mission to provide you with exceptional heart care, we have created designated Provider Care Teams.  These Care Teams include your primary Cardiologist (physician) and Advanced Practice Providers (APPs -  Physician Assistants and Nurse Practitioners) who all work together to provide you with the care you need, when you need it.  Your next appointment:   As needed  The format for your next appointment:   In Person  Provider:   Eleonore Chiquito, MD       Signed, Addison Naegeli. Audie Box, MD  [facility]  [address] [place],  [place] [address] [phone]  05/17/2019 10:59 AM

## 2019-05-17 ENCOUNTER — Ambulatory Visit (INDEPENDENT_AMBULATORY_CARE_PROVIDER_SITE_OTHER): Payer: 59 | Admitting: Cardiovascular Disease

## 2019-05-17 ENCOUNTER — Other Ambulatory Visit: Payer: Self-pay

## 2019-05-17 ENCOUNTER — Encounter: Payer: Self-pay | Admitting: Cardiovascular Disease

## 2019-05-17 VITALS — BP 100/72 | HR 83 | Temp 95.5°F | Ht 65.0 in | Wt 250.0 lb

## 2019-05-17 DIAGNOSIS — R002 Palpitations: Secondary | ICD-10-CM

## 2019-05-17 DIAGNOSIS — R0789 Other chest pain: Secondary | ICD-10-CM | POA: Diagnosis not present

## 2019-05-17 MED ORDER — FLUOXETINE HCL 10 MG PO CAPS: 10.0000 mg | ORAL_CAPSULE | Freq: Every day | ORAL | 0 refills | Status: DC

## 2019-05-17 NOTE — Patient Instructions (Signed)
Medication Instructions:  Start Prozac 10 mg *If you need a refill on your cardiac medications before your next appointment, please call your pharmacy*  Testing/Procedures: Wear ZIO- and we will call you with results.  Follow-Up: At [facility], you and your health needs are our priority.  As part of our continuing mission to provide you with exceptional heart care, we have created designated Provider Care Teams.  These Care Teams include your primary Cardiologist (physician) and Advanced Practice Providers (APPs -  Physician Assistants and Nurse Practitioners) who all work together to provide you with the care you need, when you need it.  Your next appointment:   As needed  The format for your next appointment:   In Person  Provider:   Eleonore Chiquito, MD

## 2019-05-20 DIAGNOSIS — Z0000 Encounter for general adult medical examination without abnormal findings: Secondary | ICD-10-CM | POA: Diagnosis not present

## 2019-05-20 DIAGNOSIS — R7301 Impaired fasting glucose: Secondary | ICD-10-CM | POA: Diagnosis not present

## 2019-05-20 DIAGNOSIS — R7989 Other specified abnormal findings of blood chemistry: Secondary | ICD-10-CM | POA: Diagnosis not present

## 2019-05-20 DIAGNOSIS — R82998 Other abnormal findings in urine: Secondary | ICD-10-CM | POA: Diagnosis not present

## 2019-05-27 DIAGNOSIS — F17200 Nicotine dependence, unspecified, uncomplicated: Secondary | ICD-10-CM | POA: Diagnosis not present

## 2019-05-27 DIAGNOSIS — F419 Anxiety disorder, unspecified: Secondary | ICD-10-CM | POA: Diagnosis not present

## 2019-05-27 DIAGNOSIS — J4530 Mild persistent asthma, uncomplicated: Secondary | ICD-10-CM | POA: Diagnosis not present

## 2019-05-27 DIAGNOSIS — Z0000 Encounter for general adult medical examination without abnormal findings: Secondary | ICD-10-CM | POA: Diagnosis not present

## 2019-05-27 DIAGNOSIS — F331 Major depressive disorder, recurrent, moderate: Secondary | ICD-10-CM | POA: Diagnosis not present

## 2019-05-27 DIAGNOSIS — Z1339 Encounter for screening examination for other mental health and behavioral disorders: Secondary | ICD-10-CM | POA: Diagnosis not present

## 2019-05-27 DIAGNOSIS — R7301 Impaired fasting glucose: Secondary | ICD-10-CM | POA: Diagnosis not present

## 2019-05-27 DIAGNOSIS — Z1331 Encounter for screening for depression: Secondary | ICD-10-CM | POA: Diagnosis not present

## 2019-05-27 DIAGNOSIS — F900 Attention-deficit hyperactivity disorder, predominantly inattentive type: Secondary | ICD-10-CM | POA: Diagnosis not present

## 2019-05-27 DIAGNOSIS — R002 Palpitations: Secondary | ICD-10-CM | POA: Diagnosis not present

## 2019-07-29 NOTE — Telephone Encounter (Signed)
[employer] has not received monitor back from patient. They have attempted to contact the patient on 3 occasions to request monitor be returned to [employer].  CANCELLED ORDER 

## 2019-08-16 MED FILL — predniSONE 10 MG TABS: 10 | 6 days supply | Qty: 21 | Fill #0

## 2019-09-02 ENCOUNTER — Ambulatory Visit (INDEPENDENT_AMBULATORY_CARE_PROVIDER_SITE_OTHER): Payer: PRIVATE HEALTH INSURANCE | Admitting: Rehabilitative and Restorative Service Providers"

## 2019-09-02 ENCOUNTER — Other Ambulatory Visit: Payer: Self-pay

## 2019-09-02 DIAGNOSIS — R293 Abnormal posture: Secondary | ICD-10-CM

## 2019-09-02 DIAGNOSIS — M545 Low back pain, unspecified: Secondary | ICD-10-CM

## 2019-09-02 DIAGNOSIS — M6281 Muscle weakness (generalized): Secondary | ICD-10-CM | POA: Diagnosis not present

## 2019-09-02 NOTE — Therapy (Signed)
[place], [place], [address] Phone: [phone]   Fax:  [phone]  Physical Therapy Evaluation  Patient Details  Name: Theresa Ortiz MRN: [id number] Date of Birth: 06/14/1990 Referring Provider (PT): Odis Luster, MD   Encounter Date: 09/02/2019  PT End of Session - 09/02/19 1251    Visit Number  1    Number of Visits  12    Date for PT Re-Evaluation  10/14/19    PT Start Time  1103    PT Stop Time  1145    PT Time Calculation (min)  42 min       Past Medical History:  Diagnosis Date  . Acute meniscal tear of knee    left knee  . Allergy   . Anxiety   . Asthma   . Ovarian cyst   . Shingles   . Tachycardia     Past Surgical History:  Procedure Laterality Date  . KNEE ARTHROSCOPY WITH LATERAL MENISECTOMY Left 09/01/2012   Procedure: LEFT KNEE ARTHROSCOPY WITH LATERAL MENISCECTOMY ;  Surgeon: Tobi Bastos, MD;  Location: [facility];  Service: Orthopedics;  Laterality: Left;  . RIGHT INDEX  FINGER TENDON REPAIR  NOV 2013    There were no vitals filed for this visit.   Subjective Assessment - 09/02/19 1105    Subjective  The patient is s/p low back strain while at work on August 13, 2019.  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  She reports pain in her lower lumbar region and bilateral superior SI region.  She denies referring pain, but does note some occasional tingling in her gluteal region and foot numbness.    Pertinent History  knee surgeries 2011 and 2013 (has some limited ROM), h/o thoracic scoliosis    Patient Stated Goals  strengthening low back.    Currently in Pain?  Yes    Pain Score  1    pain is 5-6/10   Pain Location  Back    Pain Orientation  Lower    Pain Descriptors / Indicators  Aching;Numbness;Tingling    Pain Onset  1 to 4 weeks ago    Pain Frequency  Intermittent    Aggravating Factors   work activities, lifting    Pain  Relieving Factors  stretching         [facility] PT Assessment - 09/02/19 1110      Assessment   Medical Diagnosis  strain of muscle, fascia, and tendon of lower back    Referring Provider (PT)  Odis Luster, MD    Onset Date/Surgical Date  08/13/19    Prior Therapy  none      Precautions   Precautions  None      Restrictions   Weight Bearing Restrictions  No      Balance Screen   Has the patient fallen in the past 6 months  No    Has the patient had a decrease in activity level because of a fear of falling?   No    Is the patient reluctant to leave their home because of a fear of falling?   No      Home Film/video editor residence      Prior Function   Level of Independence  Independent    Vocation  Full time employment    Vocation Requirements  moving patients    Leisure  *on light duty but working 3,  12 hour shifts      Observation/Other Assessments   Focus on Therapeutic Outcomes (FOTO)   48%      Sensation   Light Touch  Impaired Detail    Light Touch Impaired Details  Impaired RLE;Impaired LLE   gets numbness in feet intermittently   Additional Comments  Denies saddle paresthesias      Posture/Postural Control   Posture/Postural Control  Postural limitations    Posture Comments  thoracic scoliosis, L shoulder lower than R with compensatory head tilt,      Deep Tendon Reflexes   DTR Assessment Site  Patella;Achilles    Patella DTR  1+   hard to find patellar DTRs/ distraction with patient   Achilles DTR  2+   normal on the L, trace on the R     ROM / Strength   AROM / PROM / Strength  AROM;Strength      AROM   Overall AROM   Within functional limits for tasks performed      Strength   Overall Strength  Within functional limits for tasks performed    Strength Assessment Site  Hip;Knee;Ankle    Right/Left Hip  Right;Left    Right Hip Flexion  5/5    Right Hip Extension  4/5    Right Hip ABduction  5/5    Left Hip Flexion  5/5     Left Hip Extension  4/5    Left Hip ABduction  5/5    Right/Left Knee  Right;Left    Right Knee Flexion  5/5    Right Knee Extension  5/5    Left Knee Flexion  5/5    Left Knee Extension  5/5    Right/Left Ankle  Right;Left    Right Ankle Dorsiflexion  5/5    Left Ankle Dorsiflexion  5/5      Flexibility   Soft Tissue Assessment /Muscle Length  yes    Hamstrings  tightness noted bilaterally (has good ROM, but tenderness/trigger points noted at distal HS)    Quadriceps  knees limit prone knee flexion (not quadriceps)      Palpation   Spinal mobility  mild limitation low thoracic and lumbar PA mobility    Palpation comment  tenderness over bilateral SI joint                Objective measurements completed on examination: See above findings.      [facility] Adult PT Treatment/Exercise - 09/02/19 1110      Exercises   Exercises  Lumbar      Lumbar Exercises: Stretches   Active Hamstring Stretch  Right;Left;1 rep;30 seconds    Hip Flexor Stretch  Right;Left;1 rep;30 seconds      Lumbar Exercises: Supine   Bridge  10 reps    Other Supine Lumbar Exercises  supine isometric TA contraction             PT Education - 09/02/19 1146    Education Details  HEP    Person(s) Educated  Patient    Methods  Explanation;Demonstration;Handout    Comprehension  Verbalized understanding;Returned demonstration          PT Long Term Goals - 09/02/19 1251      PT LONG TERM GOAL #1   Title  The patient will be indep with HEP.    Time  6    Period  Weeks    Target Date  10/14/19      PT LONG TERM GOAL #2  Title  The patient will reduce functional limitation per FOTO from 48% limitation to 27% limitation.    Time  6    Period  Weeks    Target Date  10/14/19      PT LONG TERM GOAL #3   Title  The patient will report pain at worst a 3/10.    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    Time  6    Period  Weeks    Target Date  10/14/19             Plan - 09/02/19 1252    Clinical Impression Statement  The patient is a 29 yo female presenting to outpatient physical therapy s/p low back injury 08/13/19.  She has impairments of pain in low back, tingling in low sacral region (denies saddle paresthesias), occasional numbness in feet (appears worse with positions of crossing legs), dec'd hip extension strength, tightness in hamstrings, and h/o upper back scoliosis.  Functionally, the patinet is limited in heavy lifting and performing full work activities.  PT to address to return to prior functional status.    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)    Examination-Activity Limitations  Lift    Stability/Clinical Decision Making  Stable/Uncomplicated    Clinical Decision Making  Low    Rehab Potential  Good    PT Frequency  2x / week    PT Duration  6 weeks    PT Treatment/Interventions  ADLs/Self Care Home Management;Gait training;Stair training;Functional mobility training;Therapeutic activities;Therapeutic exercise;Manual techniques;Taping;Patient/family education;Electrical Stimulation;Moist Heat;Cryotherapy;Passive range of motion    PT Next Visit Plan  *please check screening questions (did not complete during eval);Progress strengthening adding LE squats, begin training body mechanics, core engagement supine to standing activities, hip extensor strengthening, HS stretch and STM.  Progress HEP to tolerance.    PT Home Exercise Plan  Access Code: [facility]    Consulted and Agree with Plan of Care  Patient       Patient will benefit from skilled therapeutic  intervention in order to improve the following deficits and impairments:  Pain, Hypomobility, Impaired flexibility, Postural dysfunction, Decreased activity tolerance, Improper body mechanics, Decreased strength  Visit Diagnosis: Acute midline low back pain without sciatica  Muscle weakness (generalized)  Abnormal posture     Problem List Patient Active Problem List   Diagnosis Date Noted  . Palpitation 07/13/2018  . Atypical chest pain 07/13/2018  . Family history of heart disease 07/13/2018  . Shortness of breath 07/13/2018  . Sinus tachycardia 07/13/2018  . Acute lateral meniscus tear of left knee 09/01/2012    Katiria Calame, PT 09/02/2019, 12:57 PM  [facility] [place], [place], [address] Phone: [phone]   Fax:  [phone]  Name: Theresa Ortiz MRN: [id number] Date of Birth: 1990-09-16

## 2019-09-02 NOTE — Patient Instructions (Signed)
Access Code: [facility] URL: [email or web address] Date: 09/02/2019 Prepared by: Rudell Cobb  Exercises Bridge - 2 x daily - 7 x weekly - 1 sets - 10 reps - 3-5 seconds hold Supine Bilateral Isometric Hip Flexion - 2 x daily - 7 x weekly - 1 sets - 10 reps - 3 seconds hold Thomas Stretch on Table - 2 x daily - 7 x weekly - 1 sets - 2 reps - 20 seconds hold Seated Hamstring Stretch with Chair - 2 x daily - 7 x weekly - 1 sets - 2 reps - 20-30 seconds hold

## 2019-09-06 ENCOUNTER — Ambulatory Visit (INDEPENDENT_AMBULATORY_CARE_PROVIDER_SITE_OTHER): Payer: PRIVATE HEALTH INSURANCE | Admitting: Physical Therapy

## 2019-09-06 ENCOUNTER — Other Ambulatory Visit: Payer: Self-pay

## 2019-09-06 DIAGNOSIS — M545 Low back pain, unspecified: Secondary | ICD-10-CM

## 2019-09-06 DIAGNOSIS — M6281 Muscle weakness (generalized): Secondary | ICD-10-CM | POA: Diagnosis not present

## 2019-09-06 DIAGNOSIS — R293 Abnormal posture: Secondary | ICD-10-CM

## 2019-09-06 NOTE — Patient Instructions (Signed)
Access Code: [email or web address]: [email or web address]: 05/10/2021Prepared by: [place] - 2 x daily - 7 x weekly - 1 sets - 10 reps - 3-5 seconds hold  Supine Bilateral Isometric Hip Flexion - 2 x daily - 7 x weekly - 1 sets - 10 reps - 3 seconds hold  Thomas Stretch on Table - 2 x daily - 7 x weekly - 1 sets - 2 reps - 20 seconds hold  Seated Hamstring Stretch with Chair - 2 x daily - 7 x weekly - 1 sets - 2 reps - 20-30 seconds hold  Hooklying Hamstring Stretch with Strap - 2 x daily - 7 x weekly - 1 sets - 2 reps - 20-30 hold  Bird Dog on [employer] - 1 x daily - 7 x weekly - 1 sets - 10 reps - 3-5 hold  Prone Middle Trapezius with Legs Straight on Swiss Ball - 1 x daily - 7 x weekly - 1 sets - 10 reps - 3 hold

## 2019-09-06 NOTE — Therapy (Signed)
[place] [facility] [place], [place], [address] Phone: [phone]   Fax:  [phone]  Physical Therapy Treatment  Patient Details  Name: Theresa Ortiz MRN: [id number] Date of Birth: 09-27-1990 Referring Provider (PT): Odis Luster, MD   Encounter Date: 09/06/2019  PT End of Session - 09/06/19 1152    Visit Number  2    Number of Visits  12    Date for PT Re-Evaluation  10/14/19    PT Start Time  1152    PT Stop Time  [id number]    PT Time Calculation (min)  43 min    Activity Tolerance  Patient tolerated treatment well    Behavior During Therapy  [facility] for tasks assessed/performed       Past Medical History:  Diagnosis Date  . Acute meniscal tear of knee    left knee  . Allergy   . Anxiety   . Asthma   . Ovarian cyst   . Shingles   . Tachycardia     Past Surgical History:  Procedure Laterality Date  . KNEE ARTHROSCOPY WITH LATERAL MENISECTOMY Left 09/01/2012   Procedure: LEFT KNEE ARTHROSCOPY WITH LATERAL MENISCECTOMY ;  Surgeon: Tobi Bastos, MD;  Location: [facility];  Service: Orthopedics;  Laterality: Left;  . RIGHT INDEX  FINGER TENDON REPAIR  NOV 2013    There were no vitals filed for this visit.  Subjective Assessment - 09/06/19 1302    Subjective  Pt reports no new changes since initial visit last week.    Pertinent History  knee surgeries 2011 and 2013 (has some limited ROM), h/o thoracic scoliosis    Patient Stated Goals  strengthening low back.    Currently in Pain?  No/denies    Pain Score  0-No pain    Pain Onset  1 to 4 weeks ago         [facility] PT Assessment - 09/06/19 0001      Assessment   Medical Diagnosis  strain of muscle, fascia, and tendon of lower back    Referring Provider (PT)  Odis Luster, MD    Onset Date/Surgical Date  08/13/19    Next MD Visit  09/23/19    Prior Therapy  none      [facility] Adult PT Treatment/Exercise - 09/06/19 0001      Lumbar Exercises: Stretches   Passive Hamstring Stretch  Right;Left;3 reps;20 seconds   seated x 1; hooklying x 2 reps   Hip Flexor Stretch  Right;Left;1 rep;20 seconds    Hip Flexor Stretch Limitations  difficulty holding opp knee to chest; strap assist.     Piriformis Stretch  Right;Left;1 rep;20 seconds   modified pigeon pose     Lumbar Exercises: Aerobic   Tread Mill  up to 2.1 mph x 5 min    low back pain increased to 2/10     Lumbar Exercises: Seated   Other Seated Lumbar Exercises  seated on physioball with gentle post lean and alternating shoulder flexion to 90 deg x 5 reps each arm .     Other Seated Lumbar Exercises  lap press with TA engaged x 5 sec (prefers supine bilat knee press with feet off of table -per HEP)      Lumbar Exercises: Supine   Ab Set  5 reps;5 seconds    Bent Knee Raise  5 reps    Dead Bug  5 reps    Bridge  10 reps;5 seconds  Lumbar Exercises: Prone   Opposite Arm/Leg Raise  Right arm/Left leg;Left arm/Right leg;5 reps;2 seconds      Lumbar Exercises: Quadruped   Opposite Arm/Leg Raise  Right arm/Left leg;Left arm/Right leg;5 reps;3 seconds   on physioball   Other Quadruped Lumbar Exercises  bilat goal posts with axial ext x 5 reps each              PT Education - 09/06/19 1256    Education Details  HEP    Person(s) Educated  Patient    Methods  Explanation;Handout;Verbal cues;Tactile cues;Demonstration    Comprehension  Verbalized understanding;Returned demonstration          PT Long Term Goals - 09/02/19 1251      PT LONG TERM GOAL #1   Title  The patient will be indep with HEP.    Time  6    Period  Weeks    Target Date  10/14/19      PT LONG TERM GOAL #2   Title  The patient will reduce functional limitation per FOTO from 48% limitation to 27% limitation.    Time  6    Period  Weeks    Target Date  10/14/19      PT LONG TERM GOAL #3   Title  The patient will report pain at worst a 3/10.    Baseline  6/10    Time   6    Period  Weeks    Target Date  10/14/19      PT LONG TERM GOAL #4   Title  The patient will improve bilateral hip extension strength to 5/5    Time  6    Period  Weeks    Target Date  10/14/19      PT LONG TERM GOAL #5   Title  The patient will return demonstrate proper body mechanics for transferring and sliding patients.    Time  6    Period  Weeks    Target Date  10/14/19            Plan - 09/06/19 1303    Clinical Impression Statement  Pt reported increased low back/SI pain up to 2/10 with walking on treadmill.  Pain level remained the same throughout session.  She tolerated quaduped (on physioball) bird dog better than prone opp arm/leg lift.  Good TA engagement with minimal cues.  Pt goals are ongoing.    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)    Examination-Activity Limitations  Lift    Stability/Clinical Decision Making  Stable/Uncomplicated    Rehab Potential  Good    PT Frequency  2x / week    PT Duration  6 weeks    PT Treatment/Interventions  ADLs/Self Care Home Management;Gait training;Stair training;Functional mobility training;Therapeutic activities;Therapeutic exercise;Manual techniques;Taping;Patient/family education;Electrical Stimulation;Moist Heat;Cryotherapy;Passive range of motion    PT Next Visit Plan  Progress strengthening adding LE squats, begin training body mechanics, core engagement supine to standing activities, hip extensor strengthening    PT Home Exercise Plan  Access Code: [facility]    Consulted and Agree with Plan of Care  Patient       Patient will benefit from skilled therapeutic intervention in order to improve the following deficits and impairments:  Pain, Hypomobility, Impaired flexibility, Postural dysfunction, Decreased activity tolerance, Improper body mechanics, Decreased strength  Visit Diagnosis: Acute midline low back pain without sciatica  Muscle  weakness (generalized)  Abnormal posture     Problem List Patient Active Problem List   Diagnosis Date Noted  . Palpitation 07/13/2018  . Atypical chest pain 07/13/2018  . Family history of heart disease 07/13/2018  . Shortness of breath 07/13/2018  . Sinus tachycardia 07/13/2018  . Acute lateral meniscus tear of left knee 09/01/2012   Kerin Perna, PTA 09/06/19 1:15 PM  [facility] [place], [place], [address] Phone: [phone]   Fax:  [phone]  Name: GLENNIE ELLEY MRN: [id number] Date of Birth: [birth year]

## 2019-09-09 ENCOUNTER — Encounter: Payer: 59 | Admitting: Physical Therapy

## 2019-09-13 ENCOUNTER — Other Ambulatory Visit: Payer: Self-pay

## 2019-09-13 ENCOUNTER — Ambulatory Visit (INDEPENDENT_AMBULATORY_CARE_PROVIDER_SITE_OTHER): Payer: PRIVATE HEALTH INSURANCE | Admitting: Physical Therapy

## 2019-09-13 DIAGNOSIS — R293 Abnormal posture: Secondary | ICD-10-CM | POA: Diagnosis not present

## 2019-09-13 DIAGNOSIS — M545 Low back pain, unspecified: Secondary | ICD-10-CM

## 2019-09-13 DIAGNOSIS — M6281 Muscle weakness (generalized): Secondary | ICD-10-CM

## 2019-09-13 NOTE — Therapy (Signed)
[place], [place], [address] Phone: [phone]   Fax:  [phone]  Physical Therapy Treatment  Patient Details  Name: Theresa Ortiz MRN: [id number] Date of Birth: 09/12/1990 Referring Provider (PT): Odis Luster, MD   Encounter Date: 09/13/2019  PT End of Session - 09/13/19 2037    Visit Number  3    Number of Visits  12    Date for PT Re-Evaluation  10/14/19    PT Start Time  [id number]    PT Stop Time  1645    PT Time Calculation (min)  41 min    Activity Tolerance  Patient tolerated treatment well    Behavior During Therapy  [facility] for tasks assessed/performed       Past Medical History:  Diagnosis Date  . Acute meniscal tear of knee    left knee  . Allergy   . Anxiety   . Asthma   . Ovarian cyst   . Shingles   . Tachycardia     Past Surgical History:  Procedure Laterality Date  . KNEE ARTHROSCOPY WITH LATERAL MENISECTOMY Left 09/01/2012   Procedure: LEFT KNEE ARTHROSCOPY WITH LATERAL MENISCECTOMY ;  Surgeon: Tobi Bastos, MD;  Location: [facility];  Service: Orthopedics;  Laterality: Left;  . RIGHT INDEX  FINGER TENDON REPAIR  NOV 2013    There were no vitals filed for this visit.  Subjective Assessment - 09/13/19 1611    Subjective  Pt reports her back pain increased at work yesterday; overall pain has intensified. Now feeling pins and needles in Lt buttock, pain across sacrum.  She thinks her flexibility has improved, however she thinks she may have strained her Rt hamstring.    Pertinent History  knee surgeries 2011 and 2013 (has some limited ROM), h/o thoracic scoliosis    Patient Stated Goals  strengthening low back.    Currently in Pain?  Yes    Pain Score  8     Pain Location  Back    Pain Orientation  Right    Pain Descriptors / Indicators  Tingling;Pins and needles;Dull    Pain Radiating Towards  into Rt buttocks, to posterior knee    Pain Onset   1 to 4 weeks ago    Aggravating Factors   sitting, standing    Pain Relieving Factors  laying down         [facility] PT Assessment - 09/13/19 0001      Assessment   Medical Diagnosis  strain of muscle, fascia, and tendon of lower back    Referring Provider (PT)  Odis Luster, MD    Onset Date/Surgical Date  08/13/19    Next MD Visit  09/23/19    Prior Therapy  none      [facility] Adult PT Treatment/Exercise - 09/13/19 0001      Lumbar Exercises: Stretches   Passive Hamstring Stretch  Right;3 reps;Left;2 reps;30 seconds   supine with strap, opp knee flexed    Prone on Elbows Stretch  2 reps;30 seconds    Piriformis Stretch  Right;Left    Piriformis Stretch Limitations  trial in hooklying to pull knee towards opp shoulder.     Figure 4 Stretch  1 rep;30 seconds      Lumbar Exercises: Prone   Straight Leg Raises Limitations  3 reps each leg; limited tolerance due to increased pain at sacrum bilat    Other Prone Lumbar Exercises  glute sets x  3 sec hold x 8 reps (pt reported difficulty sustaining contraction greater than 1 sec)      Modalities   Modalities  Moist Heat;Electrical Stimulation      Moist Heat Therapy   Number Minutes Moist Heat  10 Minutes    Moist Heat Location  Lumbar Spine      Electrical Stimulation   Electrical Stimulation Location  Bilat SI/ Rt glute and lumbar paraspinals    Electrical Stimulation Action  premod     Electrical Stimulation Parameters  10 min, intensity to tolerance     Electrical Stimulation Goals  Pain      Manual Therapy   Manual Therapy  Soft tissue mobilization;Myofascial release    Manual therapy comments  prone with pillow under abdomen tolerated well.     Soft tissue mobilization  STM and TPR to Rt lumbar paraspinals, QL, glute med, piriformis    Myofascial Release  MFR to Rt lumbar paraspinals/ QL                   PT Long Term Goals - 09/02/19 1251      PT LONG TERM GOAL #1   Title  The patient will be indep with  HEP.    Time  6    Period  Weeks    Target Date  10/14/19      PT LONG TERM GOAL #2   Title  The patient will reduce functional limitation per FOTO from 48% limitation to 27% limitation.    Time  6    Period  Weeks    Target Date  10/14/19      PT LONG TERM GOAL #3   Title  The patient will report pain at worst a 3/10.    Baseline  6/10    Time  6    Period  Weeks    Target Date  10/14/19      PT LONG TERM GOAL #4   Title  The patient will improve bilateral hip extension strength to 5/5    Time  6    Period  Weeks    Target Date  10/14/19      PT LONG TERM GOAL #5   Title  The patient will return demonstrate proper body mechanics for transferring and sliding patients.    Time  6    Period  Weeks    Target Date  10/14/19            Plan - 09/13/19 2038    Clinical Impression Statement  Pt arrived with elevated pain level of 8/10, with increased nerve symptoms into Lt glute and Rt posterior leg.  Pt point tender and tight in Rt lumbar paraspinals, glute and hamstring; palpable reduction in tightness post STM to area.  Pt had reduction of pain level when in prone; minimal tolerance for POE position.  Goals are ongoing at this time. Pt encouraged to avoid heavy lifting and twisting, and to be mindful of seated posture.    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)    Examination-Activity Limitations  Lift    Stability/Clinical Decision Making  Stable/Uncomplicated    Rehab Potential  Good    PT Frequency  2x / week    PT Duration  6 weeks    PT Treatment/Interventions  ADLs/Self Care Home Management;Gait training;Stair training;Functional mobility training;Therapeutic activities;Therapeutic exercise;Manual techniques;Taping;Patient/family education;Electrical Stimulation;Moist Heat;Cryotherapy;Passive range of motion    PT  Next Visit Plan  Progress strengthening with  core engagement supine to standing  activities, hip extensor strengthening    PT Home Exercise Plan  Access Code: [facility]    Consulted and Agree with Plan of Care  Patient       Patient will benefit from skilled therapeutic intervention in order to improve the following deficits and impairments:  Pain, Hypomobility, Impaired flexibility, Postural dysfunction, Decreased activity tolerance, Improper body mechanics, Decreased strength  Visit Diagnosis: Acute midline low back pain without sciatica  Muscle weakness (generalized)  Abnormal posture     Problem List Patient Active Problem List   Diagnosis Date Noted  . Palpitation 07/13/2018  . Atypical chest pain 07/13/2018  . Family history of heart disease 07/13/2018  . Shortness of breath 07/13/2018  . Sinus tachycardia 07/13/2018  . Acute lateral meniscus tear of left knee 09/01/2012   Kerin Perna, PTA 09/13/19 4:58 PM  [place], [place], [address] Phone: [phone]   Fax:  [phone]  Name: Theresa Ortiz MRN: [id number] Date of Birth: August 19, 1990

## 2019-09-23 ENCOUNTER — Other Ambulatory Visit: Payer: Self-pay | Admitting: Family Medicine

## 2019-09-23 ENCOUNTER — Encounter: Payer: Self-pay | Admitting: Rehabilitative and Restorative Service Providers"

## 2019-09-23 ENCOUNTER — Ambulatory Visit (INDEPENDENT_AMBULATORY_CARE_PROVIDER_SITE_OTHER): Payer: PRIVATE HEALTH INSURANCE | Admitting: Rehabilitative and Restorative Service Providers"

## 2019-09-23 ENCOUNTER — Ambulatory Visit: Payer: Self-pay

## 2019-09-23 ENCOUNTER — Other Ambulatory Visit: Payer: Self-pay

## 2019-09-23 DIAGNOSIS — M545 Low back pain, unspecified: Secondary | ICD-10-CM

## 2019-09-23 DIAGNOSIS — M6281 Muscle weakness (generalized): Secondary | ICD-10-CM | POA: Diagnosis not present

## 2019-09-23 MED FILL — CYCLOBENZAPRINE HCL 10 MG T: 10 | 10 days supply | Qty: 30 | Fill #0

## 2019-09-23 MED FILL — predniSONE 10 MG TABS: 10 | 6 days supply | Qty: 21 | Fill #0

## 2019-09-23 NOTE — Patient Instructions (Signed)
Access Code: [facility] URL: [email or web address] Date: 09/23/2019 Prepared by: Emmett on Table - 2 x daily - 7 x weekly - 1 sets - 2 reps - 20 seconds hold Hooklying Hamstring Stretch with Strap - 2 x daily - 7 x weekly - 1 sets - 2 reps - 20-30 hold Bridge - 2 x daily - 7 x weekly - 1 sets - 10 reps - 3-5 seconds hold Bent Knee Fallouts with Alternating Legs - 2 x daily - 7 x weekly - 10 reps - 1 sets Supine March - 2 x daily - 7 x weekly - 10 reps - 1 sets Prone Press Up - 2 x daily - 7 x weekly - 1 sets - 10 reps Quadruped Cat Camel - 2 x daily - 7 x weekly - 10 reps - 1 sets

## 2019-09-23 NOTE — Therapy (Signed)
[place], [place], [address] Phone: [phone]   Fax:  [phone]  Physical Therapy Treatment  Patient Details  Name: Theresa Ortiz MRN: [id number] Date of Birth: 16-Sep-1990 Referring Provider (PT): Odis Luster, MD   Encounter Date: 09/23/2019  PT End of Session - 09/23/19 1107    Visit Number  4    Number of Visits  12    Date for PT Re-Evaluation  10/14/19    PT Start Time  1103    PT Stop Time  1155    PT Time Calculation (min)  52 min    Activity Tolerance  Patient tolerated treatment well    Behavior During Therapy  [facility] for tasks assessed/performed       Past Medical History:  Diagnosis Date  . Acute meniscal tear of knee    left knee  . Allergy   . Anxiety   . Asthma   . Ovarian cyst   . Shingles   . Tachycardia     Past Surgical History:  Procedure Laterality Date  . KNEE ARTHROSCOPY WITH LATERAL MENISECTOMY Left 09/01/2012   Procedure: LEFT KNEE ARTHROSCOPY WITH LATERAL MENISCECTOMY ;  Surgeon: Tobi Bastos, MD;  Location: [facility];  Service: Orthopedics;  Laterality: Left;  . RIGHT INDEX  FINGER TENDON REPAIR  NOV 2013    There were no vitals filed for this visit.  Subjective Assessment - 09/23/19 1103    Subjective  The patient reports her pain is worsening.  She has not been able to do her exercises because of pain.  She went to the beach and was not able to do much.  She is using heating pad/ice with no relief.  She is getting lightening bolt pain in her right and left sides along sacral border that radiates into legs.  She has numbness and tingling in both feet (R is lateral foot and L is also lateral 3 toes).  She cannot sleep well and is feeling increased pain and stiffness.    Pertinent History  knee surgeries 2011 and 2013 (has some limited ROM), h/o thoracic scoliosis    Patient Stated Goals  strengthening low back.    Currently in  Pain?  Yes    Pain Score  9     Pain Location  Back    Pain Orientation  Lower    Pain Descriptors / Indicators  Tightness;Aching;Shooting;Numbness;Sharp;Discomfort    Pain Onset  More than a month ago    Pain Frequency  Intermittent    Aggravating Factors   sitting, standing    Pain Relieving Factors  nothing reduces pain                        [facility] Adult PT Treatment/Exercise - 09/23/19 1131      Self-Care   Self-Care  Other Self-Care Comments    Other Self-Care Comments   work station set up *patient notes she cannot pull her knees up under her desk (she turns sideways and rotates to face computer); discussed frequent standing and walking to avoid prolonged sitting.      Exercises   Exercises  Lumbar      Lumbar Exercises: Stretches   Single Knee to Chest Stretch  Right;Left;2 reps;30 seconds    Double Knee to Chest Stretch  30 seconds    Press Ups  10 reps    Press Ups Limitations  with core activation  Lumbar Exercises: Aerobic   Other Aerobic Exercise  walking to assess tolerance with pain reducing to 7/10 after ther ex and STM      Lumbar Exercises: Supine   Ab Set  5 reps    Bent Knee Raise  10 reps    Bent Knee Raise Limitations  with transverse abdominus contraction    Other Supine Lumbar Exercises  bent knee fallout x R and L 10 reps      Lumbar Exercises: Prone   Other Prone Lumbar Exercises  prone press ups with TA activation x 5 reps      Lumbar Exercises: Quadruped   [place]  10 reps      Modalities   Modalities  Moist Heat;Electrical Stimulation      Moist Heat Therapy   Number Minutes Moist Heat  15 Minutes    Moist Heat Location  Lumbar Spine      Electrical Stimulation   Electrical Stimulation Location  bilat SI and low back    Electrical Stimulation Action  interferential    Electrical Stimulation Parameters  15 min, to tolerance    Electrical Stimulation Goals  Pain      Manual Therapy   Manual Therapy  Joint  mobilization;Soft tissue mobilization;Myofascial release    Manual therapy comments  prone    Joint Mobilization  sacral compression and R side grade II sacral mobs; PA mobs supine thoracic spine (low)    Soft tissue mobilization  STM and IASTM paraspinals thoracic and lumbar region; R glut maximus at origin of R sacral border             PT Education - 09/23/19 1351    Education Details  HEP- modified to ensure the patient can tolerate current activities due to recent worsening.    Person(s) Educated  Patient    Methods  Explanation;Demonstration;Handout    Comprehension  Returned demonstration;Verbalized understanding          PT Long Term Goals - 09/02/19 1251      PT LONG TERM GOAL #1   Title  The patient will be indep with HEP.    Time  6    Period  Weeks    Target Date  10/14/19      PT LONG TERM GOAL #2   Title  The patient will reduce functional limitation per FOTO from 48% limitation to 27% limitation.    Time  6    Period  Weeks    Target Date  10/14/19      PT LONG TERM GOAL #3   Title  The patient will report pain at worst a 3/10.    Baseline  6/10    Time  6    Period  Weeks    Target Date  10/14/19      PT LONG TERM GOAL #4   Title  The patient will improve bilateral hip extension strength to 5/5    Time  6    Period  Weeks    Target Date  10/14/19      PT LONG TERM GOAL #5   Title  The patient will return demonstrate proper body mechanics for transferring and sliding patients.    Time  6    Period  Weeks    Target Date  10/14/19            Plan - 09/23/19 1352    Clinical Impression Statement  The patient initially began PT with 1/10 pain noting 6/10  at worst.  Today, she arrives with 9/10 pain today.  She had a slow day at work last week and sat for majority of shift and had increased pain to 8/10 (at last week's session).  PT modified HEP, discussed continuing to move as moving improves her pain, and avoiding sitting x hours.  We also  discussed work space modification b/c she does not have a desk she can slide her knees under and rotates her back to see the computer.  Plan to add dry needling as intervention next visit for trigger points and then continue to progress mobility to tolerance.    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), Progress strengthening with  core engagement supine to standing activities, hip extensor strengthening    PT Home Exercise Plan  Access Code: [facility]    Consulted and Agree with Plan of Care  Patient       Patient will benefit from skilled therapeutic intervention in order to improve the following deficits and impairments:  Pain, Hypomobility, Impaired flexibility, Postural dysfunction, Decreased activity tolerance, Improper body mechanics, Decreased strength  Visit Diagnosis: Acute midline low back pain without sciatica  Muscle weakness (generalized)     Problem List Patient Active Problem List   Diagnosis Date Noted  . Palpitation 07/13/2018  . Atypical chest pain 07/13/2018  . Family history of heart disease 07/13/2018  . Shortness of breath 07/13/2018  . Sinus tachycardia 07/13/2018  . Acute lateral meniscus tear of left knee 09/01/2012    Jaquez Farrington, PT 09/23/2019, 2:03 PM  [facility] [place], [place], [address] Phone: [phone]   Fax:  [phone]  Name: TRELLA MAHANNAH MRN: [id number] Date of Birth: [birth year]

## 2019-09-24 ENCOUNTER — Other Ambulatory Visit: Payer: Self-pay | Admitting: Family Medicine

## 2019-09-24 ENCOUNTER — Ambulatory Visit
Admission: RE | Admit: 2019-09-24 | Discharge: 2019-09-24 | Disposition: A | Discharge status: Home / Self Care | Payer: PRIVATE HEALTH INSURANCE | Source: Ambulatory Visit | Attending: Family Medicine | Admitting: Family Medicine

## 2019-09-24 DIAGNOSIS — S39012A Strain of muscle, fascia and tendon of lower back, initial encounter: Secondary | ICD-10-CM

## 2019-10-09 ENCOUNTER — Telehealth: Payer: 59 | Admitting: Physician Assistant

## 2019-10-09 DIAGNOSIS — T23219A Burn of second degree of unspecified thumb (nail), initial encounter: Secondary | ICD-10-CM | POA: Diagnosis not present

## 2019-10-09 MED ORDER — SILVER SULFADIAZINE 1 % EX CREA: 1.0000 "application " | TOPICAL_CREAM | Freq: Two times a day (BID) | CUTANEOUS | 0 refills | Status: DC
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
## 2019-10-11 ENCOUNTER — Other Ambulatory Visit (HOSPITAL_COMMUNITY): Payer: Self-pay | Admitting: Orthopedic Surgery

## 2019-10-11 ENCOUNTER — Other Ambulatory Visit: Payer: Self-pay | Admitting: Orthopedic Surgery

## 2019-10-11 ENCOUNTER — Encounter: Payer: 59 | Admitting: Physical Therapy

## 2019-10-11 DIAGNOSIS — M546 Pain in thoracic spine: Secondary | ICD-10-CM

## 2019-10-14 ENCOUNTER — Ambulatory Visit (INDEPENDENT_AMBULATORY_CARE_PROVIDER_SITE_OTHER): Payer: PRIVATE HEALTH INSURANCE | Admitting: Rehabilitative and Restorative Service Providers"

## 2019-10-14 ENCOUNTER — Encounter: Payer: 59 | Admitting: Rehabilitative and Restorative Service Providers"

## 2019-10-14 ENCOUNTER — Other Ambulatory Visit: Payer: Self-pay

## 2019-10-14 ENCOUNTER — Encounter: Payer: Self-pay | Admitting: Rehabilitative and Restorative Service Providers"

## 2019-10-14 DIAGNOSIS — R293 Abnormal posture: Secondary | ICD-10-CM

## 2019-10-14 DIAGNOSIS — M25511 Pain in right shoulder: Secondary | ICD-10-CM | POA: Diagnosis not present

## 2019-10-14 DIAGNOSIS — M6281 Muscle weakness (generalized): Secondary | ICD-10-CM

## 2019-10-14 DIAGNOSIS — M545 Low back pain, unspecified: Secondary | ICD-10-CM

## 2019-10-14 DIAGNOSIS — G8929 Other chronic pain: Secondary | ICD-10-CM

## 2019-10-14 NOTE — Therapy (Signed)
[place], [place], [address] Phone: [phone]   Fax:  [phone]  Physical Therapy Treatment  Patient Details  Name: Theresa Ortiz MRN: [id number] Date of Birth: Apr 26, 1991 Referring Provider (PT): Odis Luster, MD   Encounter Date: 10/14/2019   PT End of Session - 10/14/19 0853    Visit Number 5    Number of Visits 12    Date for PT Re-Evaluation 10/14/19    Authorization Type *8 VISITS APPROVED WORKER'S COMP    Authorization - Visit Number 5    Authorization - Number of Visits 8    PT Start Time [phone]    PT Stop Time 0935    PT Time Calculation (min) 42 min    Activity Tolerance Patient tolerated treatment well           Past Medical History:  Diagnosis Date  . Acute meniscal tear of knee    left knee  . Allergy   . Anxiety   . Asthma   . Ovarian cyst   . Shingles   . Tachycardia     Past Surgical History:  Procedure Laterality Date  . KNEE ARTHROSCOPY WITH LATERAL MENISECTOMY Left 09/01/2012   Procedure: LEFT KNEE ARTHROSCOPY WITH LATERAL MENISCECTOMY ;  Surgeon: Tobi Bastos, MD;  Location: [facility];  Service: Orthopedics;  Laterality: Left;  . RIGHT INDEX  FINGER TENDON REPAIR  NOV 2013    There were no vitals filed for this visit.   Subjective Assessment - 10/14/19 0857    Subjective Patient saw orthopedist Monday and he "wants to stop all physical exercise" OK for the DN. Patient wants to try the DN. Patient reports that MD is going to schedule MRI.    Currently in Pain? Yes    Pain Score 6     Pain Location Back    Pain Orientation Lower    Pain Descriptors / Indicators Tightness;Aching;Shooting;Numbness;Sharp;Discomfort              [facility] PT Assessment - 10/14/19 0001      Assessment   Medical Diagnosis strain of muscle, fascia, and tendon of lower back    Referring Provider (PT) Odis Luster, MD    Onset Date/Surgical Date  08/13/19    Next MD Visit 09/23/19    Prior Therapy none      Palpation   Spinal mobility mild limitation low thoracic and lumbar PA mobility    Palpation comment tightness through the thoracolumbar paraspinals and into the posterior hips                          [facility] Adult PT Treatment/Exercise - 10/14/19 0001      Moist Heat Therapy   Number Minutes Moist Heat 10 Minutes    Moist Heat Location Lumbar Spine   thoracic spine      Electrical Stimulation   Electrical Stimulation Location bilat SI and Lt thoracic spine; Rt LB    Electrical Stimulation Action IFC    Electrical Stimulation Parameters to tolerance     Electrical Stimulation Goals Pain;Tone      Manual Therapy   Manual therapy comments prone    Soft tissue mobilization working through the lumbothoracic musculature; bilat posterior hips - SI area into the piriformis and hip abductors     Myofascial Release thoracolumbar             Trigger Point Dry Needling -  10/14/19 0001    Consent Given? Yes    Education Handout Provided Yes    Gluteus Maximus Response Palpable increased muscle length    Piriformis Response Palpable increased muscle length    Erector spinae Response Palpable increased muscle length    Lumbar multifidi Response Palpable increased muscle length    Thoracic multifidi response Palpable increased muscle length                PT Education - 10/14/19 0926    Education Details DN    Person(s) Educated Patient    Methods Explanation;Handout    Comprehension Verbalized understanding               PT Long Term Goals - 09/02/19 1251      PT LONG TERM GOAL #1   Title The patient will be indep with HEP.    Time 6    Period Weeks    Target Date 10/14/19      PT LONG TERM GOAL #2   Title The patient will reduce functional limitation per FOTO from 48% limitation to 27% limitation.    Time 6    Period Weeks    Target Date 10/14/19      PT LONG TERM GOAL #3   Title  The patient will report pain at worst a 3/10.    Baseline 6/10    Time 6    Period Weeks    Target Date 10/14/19      PT LONG TERM GOAL #4   Title The patient will improve bilateral hip extension strength to 5/5    Time 6    Period Weeks    Target Date 10/14/19      PT LONG TERM GOAL #5   Title The patient will return demonstrate proper body mechanics for transferring and sliding patients.    Time 6    Period Weeks    Target Date 10/14/19                 Plan - 10/14/19 0926    Clinical Impression Statement Trial of DN at patient's request. Patient reports continued pain in the thoracic and lumbar spine into bilat posterior hips - Lt > Rt. Patient tolerated DN well with palpable decrease in muscular tightness noted following manual work. She will check with workers comp Tourist information centre manager to determine course of treatment going forward. We have not heard from MD office or WC re- discontinuing PT.    Rehab Potential Good    PT Frequency 2x / week    PT Duration 6 weeks    PT Treatment/Interventions ADLs/Self Care Home Management;Gait training;Stair training;Functional mobility training;Therapeutic activities;Therapeutic exercise;Manual techniques;Taping;Patient/family education;Electrical Stimulation;Moist Heat;Cryotherapy;Passive range of motion    PT Next Visit Plan assess response to DN; continue treatment per MD orders    PT Home Exercise Plan Access Code: [facility]    Consulted and Agree with Plan of Care Patient           Patient will benefit from skilled therapeutic intervention in order to improve the following deficits and impairments:     Visit Diagnosis: Acute midline low back pain without sciatica  Muscle weakness (generalized)  Abnormal posture  Chronic right shoulder pain     Problem List Patient Active Problem List   Diagnosis Date Noted  . Palpitation 07/13/2018  . Atypical chest pain 07/13/2018  . Family history of heart disease 07/13/2018  .  Shortness of breath 07/13/2018  . Sinus tachycardia 07/13/2018  .  Acute lateral meniscus tear of left knee 09/01/2012    Marquan Vokes Nilda Simmer PT, MPH  10/14/2019, 9:29 AM  [facility] [place], [place], [address] Phone: [phone]   Fax:  [phone]  Name: EMELYN ROEN MRN: [id number] Date of Birth: January 18, 1991

## 2019-10-14 NOTE — Patient Instructions (Signed)

## 2019-10-15 ENCOUNTER — Other Ambulatory Visit: Payer: Self-pay | Admitting: Orthopedic Surgery

## 2019-10-15 ENCOUNTER — Other Ambulatory Visit (HOSPITAL_COMMUNITY): Payer: Self-pay | Admitting: Orthopedic Surgery

## 2019-10-15 DIAGNOSIS — M546 Pain in thoracic spine: Secondary | ICD-10-CM

## 2019-10-18 ENCOUNTER — Encounter: Payer: Self-pay | Admitting: Physical Therapy

## 2019-10-18 ENCOUNTER — Other Ambulatory Visit: Payer: Self-pay

## 2019-10-18 ENCOUNTER — Ambulatory Visit (INDEPENDENT_AMBULATORY_CARE_PROVIDER_SITE_OTHER): Payer: 59 | Admitting: Physical Therapy

## 2019-10-18 DIAGNOSIS — M6281 Muscle weakness (generalized): Secondary | ICD-10-CM | POA: Diagnosis not present

## 2019-10-18 DIAGNOSIS — M545 Low back pain, unspecified: Secondary | ICD-10-CM

## 2019-10-18 DIAGNOSIS — R293 Abnormal posture: Secondary | ICD-10-CM

## 2019-10-18 NOTE — Therapy (Signed)
[place], [place], [address] Phone: [phone]   Fax:  [phone]  Physical Therapy Treatment  Patient Details  Name: Theresa Ortiz MRN: [id number] Date of Birth: 10-22-1990 Referring Provider (PT): Odis Luster, MD   Encounter Date: 10/18/2019   PT End of Session - 10/18/19 1018    Visit Number 6    Number of Visits 12    Date for PT Re-Evaluation 10/14/19    Authorization Type *8 VISITS APPROVED WORKER'S COMP    Authorization - Visit Number 6    Authorization - Number of Visits 8    PT Start Time 7124    PT Stop Time 1117    PT Time Calculation (min) 59 min    Activity Tolerance Patient tolerated treatment well    Behavior During Therapy [facility] for tasks assessed/performed           Past Medical History:  Diagnosis Date  . Acute meniscal tear of knee    left knee  . Allergy   . Anxiety   . Asthma   . Ovarian cyst   . Shingles   . Tachycardia     Past Surgical History:  Procedure Laterality Date  . KNEE ARTHROSCOPY WITH LATERAL MENISECTOMY Left 09/01/2012   Procedure: LEFT KNEE ARTHROSCOPY WITH LATERAL MENISCECTOMY ;  Surgeon: Tobi Bastos, MD;  Location: [facility];  Service: Orthopedics;  Laterality: Left;  . RIGHT INDEX  FINGER TENDON REPAIR  NOV 2013    There were no vitals filed for this visit.   Subjective Assessment - 10/18/19 1018    Subjective Had a lot of relief in the low back. Left lower throacic area still hurting    Currently in Pain? Yes    Pain Score 4     Pain Location Back                             [facility] Adult PT Treatment/Exercise - 10/18/19 0001      Lumbar Exercises: Stretches   Prone Mid Back Stretch Limitations standing mid back stretch with forward arm reach (didn't feel stretch)    Other Lumbar Stretch Exercise thoracic extension over foam roller (green and big white) horizontal; vertical with bil OH  arm raise    Other Lumbar Stretch Exercise hooklying with alt shoulder flexion x 5 ea; thoracic open book x 5 each way      Lumbar Exercises: Standing   Functional Squats Limitations mini squat with bil OH shoulder flex 2x10 sec; and with bil shoulder ext 2x10 sec      Modalities   Modalities Moist Heat;Electrical Stimulation      Moist Heat Therapy   Number Minutes Moist Heat 10 Minutes    Moist Heat Location Lumbar Spine   thoracic left     Electrical Stimulation   Electrical Stimulation Location to left lower ribs and QL area    Electrical Stimulation Action IFC    Electrical Stimulation Parameters to tolerance    Electrical Stimulation Goals Pain;Tone      Manual Therapy   Manual Therapy Joint mobilization;Soft tissue mobilization;Myofascial release    Manual therapy comments prone and SDLY    Joint Mobilization upper lumbar and lower throracic CPA and UPA left    Soft tissue mobilization left thoracic/lumbar/intercostals of lower ribs    Myofascial Release to left QL  Trigger Point Dry Needling - 10/18/19 0001    Consent Given? Yes    Education Handout Provided Previously provided    Muscles Treated Back/Hip Quadratus lumborum;Thoracic multifidi;Erector spinae    Erector spinae Response Palpable increased muscle length    Quadratus Lumborum Response Palpable increased muscle length    Thoracic multifidi response Palpable increased muscle length   left lower                    PT Long Term Goals - 09/02/19 1251      PT LONG TERM GOAL #1   Title The patient will be indep with HEP.    Time 6    Period Weeks    Target Date 10/14/19      PT LONG TERM GOAL #2   Title The patient will reduce functional limitation per FOTO from 48% limitation to 27% limitation.    Time 6    Period Weeks    Target Date 10/14/19      PT LONG TERM GOAL #3   Title The patient will report pain at worst a 3/10.    Baseline 6/10    Time 6    Period Weeks    Target  Date 10/14/19      PT LONG TERM GOAL #4   Title The patient will improve bilateral hip extension strength to 5/5    Time 6    Period Weeks    Target Date 10/14/19      PT LONG TERM GOAL #5   Title The patient will return demonstrate proper body mechanics for transferring and sliding patients.    Time 6    Period Weeks    Target Date 10/14/19                 Plan - 10/18/19 1224    Clinical Impression Statement Patient reported relief in the lumbar area with previous DN. Her pain was mainly in left lower thoracic region today from T9/10 down and laterally into ribs. She had pain with deep palpation and was very sensitive to STW along left ribs. She tolerated thoracic mobility exercises well today.    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)    PT Treatment/Interventions ADLs/Self Care Home Management;Gait training;Stair training;Functional mobility training;Therapeutic activities;Therapeutic exercise;Manual techniques;Taping;Patient/family education;Electrical Stimulation;Moist Heat;Cryotherapy;Passive range of motion    PT Next Visit Plan assess response to DN; continue treatment per MD orders           Patient will benefit from skilled therapeutic intervention in order to improve the following deficits and impairments:  Pain, Hypomobility, Impaired flexibility, Postural dysfunction, Decreased activity tolerance, Improper body mechanics, Decreased strength  Visit Diagnosis: Acute midline low back pain without sciatica  Muscle weakness (generalized)  Abnormal posture     Problem List Patient Active Problem List   Diagnosis Date Noted  . Palpitation 07/13/2018  . Atypical chest pain 07/13/2018  . Family history of heart disease 07/13/2018  . Shortness of breath 07/13/2018  . Sinus tachycardia 07/13/2018  . Acute lateral meniscus tear of left knee 09/01/2012    Madelyn Flavors PT 10/18/2019, 12:30 PM  [facility] [place], [place], [address] Phone: [phone]   Fax:  [phone]  Name: Theresa Ortiz MRN: [id number] Date of Birth: [birth year]

## 2019-10-19 ENCOUNTER — Other Ambulatory Visit: Payer: Self-pay

## 2019-10-19 ENCOUNTER — Ambulatory Visit
Admission: RE | Admit: 2019-10-19 | Discharge: 2019-10-19 | Disposition: A | Discharge status: Home / Self Care | Payer: PRIVATE HEALTH INSURANCE | Source: Ambulatory Visit | Attending: Orthopedic Surgery | Admitting: Orthopedic Surgery

## 2019-10-19 DIAGNOSIS — M546 Pain in thoracic spine: Secondary | ICD-10-CM | POA: Diagnosis present

## 2019-10-25 MED FILL — MELOXICAM 15 MG TABLET: 15 | 30 days supply | Qty: 30 | Fill #0

## 2019-10-25 MED FILL — METHOCARBAMOL 500 MG TABS: 500 | 15 days supply | Qty: 60 | Fill #0

## 2019-11-02 ENCOUNTER — Other Ambulatory Visit: Payer: 59

## 2019-11-08 ENCOUNTER — Encounter: Payer: PRIVATE HEALTH INSURANCE | Admitting: Rehabilitative and Restorative Service Providers"

## 2019-11-15 ENCOUNTER — Encounter: Payer: Self-pay | Admitting: Rehabilitative and Restorative Service Providers"

## 2019-11-15 ENCOUNTER — Ambulatory Visit (INDEPENDENT_AMBULATORY_CARE_PROVIDER_SITE_OTHER): Payer: PRIVATE HEALTH INSURANCE | Admitting: Rehabilitative and Restorative Service Providers"

## 2019-11-15 ENCOUNTER — Other Ambulatory Visit: Payer: Self-pay

## 2019-11-15 DIAGNOSIS — M6281 Muscle weakness (generalized): Secondary | ICD-10-CM | POA: Diagnosis not present

## 2019-11-15 DIAGNOSIS — M545 Low back pain, unspecified: Secondary | ICD-10-CM

## 2019-11-15 DIAGNOSIS — M25511 Pain in right shoulder: Secondary | ICD-10-CM | POA: Diagnosis not present

## 2019-11-15 DIAGNOSIS — R293 Abnormal posture: Secondary | ICD-10-CM | POA: Diagnosis not present

## 2019-11-15 DIAGNOSIS — G8929 Other chronic pain: Secondary | ICD-10-CM

## 2019-11-15 NOTE — Patient Instructions (Signed)
Access Code: [email or web address]: [email or web address]: 07/19/2021Prepared by: Ashton Sabine HoltExercises  Thomas Stretch on Table - 2 x daily - 7 x weekly - 1 sets - 2 reps - 20 seconds hold  Hooklying Hamstring Stretch with Strap - 2 x daily - 7 x weekly - 1 sets - 2 reps - 20-30 hold  Bridge - 2 x daily - 7 x weekly - 1 sets - 10 reps - 3-5 seconds hold  Bent Knee Fallouts with Alternating Legs - 2 x daily - 7 x weekly - 10 reps - 1 sets  Supine March - 2 x daily - 7 x weekly - 10 reps - 1 sets  Prone Press Up - 2 x daily - 7 x weekly - 1 sets - 10 reps  Quadruped Cat Camel - 2 x daily - 7 x weekly - 10 reps - 1 sets  Sidelying Thoracic Rotation with Open Book - 1 x daily - 7 x weekly - 2 sets - 5 reps  Supine Alternating Shoulder Flexion - 1 x daily - 7 x weekly - 1 sets - 10 reps  Standing Shoulder External Rotation with Resistance - 2 x daily - 7 x weekly - 1 sets - 3 reps - 30 sec hold  Standing Bilateral Low Shoulder Row with Anchored Resistance - 2 x daily - 7 x weekly - 1 sets - 3 reps - 30 sec hold  Shoulder Extension with Resistance - 2 x daily - 7 x weekly - 1 sets - 3 reps - 30 sec hold

## 2019-11-15 NOTE — Therapy (Signed)
[facility] [place], [place], [address] Phone: [phone]   Fax:  [phone]  Physical Therapy Treatment  Patient Details  Name: Theresa Ortiz MRN: [id number] Date of Birth: [birth year] Referring Provider (PT): Odis Luster, MD   Encounter Date: 11/15/2019   PT End of Session - 11/15/19 1351    Visit Number 7    Number of Visits 12    Date for PT Re-Evaluation 10/14/19    Authorization Type *8 VISITS APPROVED WORKER'S COMP    Authorization - Visit Number 6    PT Start Time 2952    PT Stop Time 1439    PT Time Calculation (min) 50 min    Activity Tolerance Patient tolerated treatment well           Past Medical History:  Diagnosis Date  . Acute meniscal tear of knee    left knee  . Allergy   . Anxiety   . Asthma   . Ovarian cyst   . Shingles   . Tachycardia     Past Surgical History:  Procedure Laterality Date  . KNEE ARTHROSCOPY WITH LATERAL MENISECTOMY Left 09/01/2012   Procedure: LEFT KNEE ARTHROSCOPY WITH LATERAL MENISCECTOMY ;  Surgeon: Tobi Bastos, MD;  Location: [facility];  Service: Orthopedics;  Laterality: Left;  . RIGHT INDEX  FINGER TENDON REPAIR  NOV 2013    There were no vitals filed for this visit.   Subjective Assessment - 11/15/19 1352    Subjective Patient reports that she is doing pretty well. 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 She is working to be careful with body mechanics. Trying to do her exercises and walking some. She is doing some of the exercises.    Currently in Pain? Yes    Pain Score 3     Pain Location Back    Pain Orientation Mid    Pain Descriptors / Indicators Dull;Aching    Pain Type Chronic pain    Pain Radiating Towards tingling to Rt buttock with lifting    Pain Onset More than a month ago    Pain Frequency Intermittent    Aggravating Factors  sitting; standing    Pain Relieving Factors  tordol; lying down; heat; ice; DN              [facility] PT Assessment - 11/15/19 0001      Assessment   Medical Diagnosis strain of muscle, fascia, and tendon of lower back    Referring Provider (PT) Odis Luster, MD    Onset Date/Surgical Date 08/13/19    Hand Dominance Right    Next MD Visit 09/23/19    Prior Therapy none      Sensation   Additional Comments intermittent tingling in the Rt buttock with lifting patients       Posture/Postural Control   Posture Comments thoracic scoliosis, L shoulder lower than R with compensatory head tilt,      Strength   Right Hip Flexion 5/5    Right Hip Extension --   5-/5    Right Hip ABduction 5/5    Left Hip Flexion 5/5    Left Hip Extension --   5-/5   Left Hip ABduction 5/5      Flexibility   Hamstrings WFL's bilat     Quadriceps knees limit prone knee flexion (not quadriceps)      Palpation   Spinal mobility mild limitation low  thoracic and lumbar PA mobility    Palpation comment tightness through the thoracolumbar paraspinals and into the posterior hips                          [facility] Adult PT Treatment/Exercise - 11/15/19 0001      Lumbar Exercises: Stretches   Prone on Elbows Stretch 2 reps;30 seconds      Lumbar Exercises: Standing   Scapular Retraction Strengthening;Both;10 reps;Theraband    Theraband Level (Scapular Retraction) Level 2 (Red)    Row Strengthening;Both;10 reps;Theraband    Shoulder Extension Strengthening;Both;10 reps;Theraband    Theraband Level (Shoulder Extension) Level 2 (Red)      Lumbar Exercises: Prone   Other Prone Lumbar Exercises UE/LE lifs x 10       Lumbar Exercises: Quadruped   Madcat/Old Horse 10 reps    Other Quadruped Lumbar Exercises child's pose 20 sec x 3       Moist Heat Therapy   Number Minutes Moist Heat 10 Minutes    Moist Heat Location Lumbar Spine   thoracic left     Electrical Stimulation   Electrical Stimulation Location to left lower ribs and QL  area    Electrical Stimulation Action IFC    Electrical Stimulation Parameters to tolerance    Electrical Stimulation Goals Pain;Tone      Manual Therapy   Manual Therapy Joint mobilization;Soft tissue mobilization;Myofascial release    Manual therapy comments skilled palpation to assess soft tissue response to DN and manual work     Soft tissue mobilization left thoracic/lumbar/intercostals of lower ribs; lumbar musculature     Myofascial Release Lt thoracic             Trigger Point Dry Needling - 11/15/19 0001    Consent Given? Yes    Education Handout Provided Previously provided    Erector spinae Response Palpable increased muscle length    Lumbar multifidi Response Palpable increased muscle length    Thoracic multifidi response Palpable increased muscle length   left lower               PT Education - 11/15/19 1418    Education Details HEP    Person(s) Educated Patient    Methods Explanation;Demonstration;Tactile cues;Verbal cues;Handout    Comprehension Verbalized understanding;Returned demonstration;Verbal cues required;Tactile cues required               PT Long Term Goals - 09/02/19 1251      PT LONG TERM GOAL #1   Title The patient will be indep with HEP.    Time 6    Period Weeks    Target Date 10/14/19      PT LONG TERM GOAL #2   Title The patient will reduce functional limitation per FOTO from 48% limitation to 27% limitation.    Time 6    Period Weeks    Target Date 10/14/19      PT LONG TERM GOAL #3   Title The patient will report pain at worst a 3/10.    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    Time 6    Period Weeks    Target  Date 10/14/19                 Plan - 11/15/19 1411    Clinical Impression Statement Improving with patient reporting decreased pain and improved functional and work activities. She is working on exercises some at home. Patient demonstrates incresaed LE strength and decresead palpable tightness. Progressing gradually toward stated goals of therapy.    Rehab Potential Good    PT Frequency 2x / week    PT Duration 6 weeks    PT Treatment/Interventions ADLs/Self Care Home Management;Gait training;Stair training;Functional mobility training;Therapeutic activities;Therapeutic exercise;Manual techniques;Taping;Patient/family education;Electrical Stimulation;Moist Heat;Cryotherapy;Passive range of motion    PT Next Visit Plan assess response to DN; continue treatment progressing with strengthening and stabilization    PT Home Exercise Plan Access Code: [facility]    Consulted and Agree with Plan of Care Patient           Patient will benefit from skilled therapeutic intervention in order to improve the following deficits and impairments:     Visit Diagnosis: Acute midline low back pain without sciatica  Muscle weakness (generalized)  Abnormal posture  Chronic right shoulder pain     Problem List Patient Active Problem List   Diagnosis Date Noted  . Palpitation 07/13/2018  . Atypical chest pain 07/13/2018  . Family history of heart disease 07/13/2018  . Shortness of breath 07/13/2018  . Sinus tachycardia 07/13/2018  . Acute lateral meniscus tear of left knee 09/01/2012    Addy Mcmannis Nilda Simmer PT, MPH  11/15/2019, 2:41 PM  [facility] [address] [place], [place], [address] Phone: [phone]   Fax:  [phone]  Name: Theresa Ortiz MRN: [id number] Date of Birth: April 24, 1991

## 2019-11-18 ENCOUNTER — Ambulatory Visit (INDEPENDENT_AMBULATORY_CARE_PROVIDER_SITE_OTHER): Payer: PRIVATE HEALTH INSURANCE | Admitting: Rehabilitative and Restorative Service Providers"

## 2019-11-18 ENCOUNTER — Encounter: Payer: Self-pay | Admitting: Rehabilitative and Restorative Service Providers"

## 2019-11-18 ENCOUNTER — Other Ambulatory Visit: Payer: Self-pay

## 2019-11-18 DIAGNOSIS — M545 Low back pain, unspecified: Secondary | ICD-10-CM

## 2019-11-18 DIAGNOSIS — R293 Abnormal posture: Secondary | ICD-10-CM | POA: Diagnosis not present

## 2019-11-18 DIAGNOSIS — G8929 Other chronic pain: Secondary | ICD-10-CM

## 2019-11-18 DIAGNOSIS — M6281 Muscle weakness (generalized): Secondary | ICD-10-CM

## 2019-11-18 DIAGNOSIS — M25511 Pain in right shoulder: Secondary | ICD-10-CM | POA: Diagnosis not present

## 2019-11-18 NOTE — Patient Instructions (Signed)
Bow and arrow  Green theraband  10 reps each side x 2 sets     Reverse wall push up  10 reps x 2 sets

## 2019-11-18 NOTE — Therapy (Addendum)
[facility] [place], [place], [address] Phone: [phone]   Fax:  [phone]  Physical Therapy Treatment  Patient Details  Name: Theresa Ortiz MRN: [id number] Date of Birth: [birth year] Referring Provider (PT): Odis Luster, MD   Encounter Date: 11/18/2019   PT End of Session - 11/18/19 1104    Visit Number 8    Number of Visits 12    Date for PT Re-Evaluation 10/14/19    Authorization Type *8 VISITS APPROVED WORKER'S COMP    Authorization - Visit Number 8    Authorization - Number of Visits 8    PT Start Time 1017    PT Stop Time 1150    PT Time Calculation (min) 48 min    Activity Tolerance Patient tolerated treatment well           Past Medical History:  Diagnosis Date  . Acute meniscal tear of knee    left knee  . Allergy   . Anxiety   . Asthma   . Ovarian cyst   . Shingles   . Tachycardia     Past Surgical History:  Procedure Laterality Date  . KNEE ARTHROSCOPY WITH LATERAL MENISECTOMY Left 09/01/2012   Procedure: LEFT KNEE ARTHROSCOPY WITH LATERAL MENISCECTOMY ;  Surgeon: Tobi Bastos, MD;  Location: [facility];  Service: Orthopedics;  Laterality: Left;  . RIGHT INDEX  FINGER TENDON REPAIR  NOV 2013    There were no vitals filed for this visit.   Subjective Assessment - 11/18/19 1105    Subjective Patient reports that she did well with the Dn and manual work last visit. Awoke this am with some "stiffness" in the Lt thoracic area. Worked for the past two days with no increse in symptoms. Reports that she is working on the exercises at home.    Currently in Pain? Yes    Pain Score 4     Pain Location Back    Pain Orientation Mid    Pain Descriptors / Indicators Tightness    Pain Type Chronic pain    Pain Radiating Towards no radicular pain    Pain Onset More than a month ago    Pain Frequency Intermittent    Aggravating Factors  sitting and standing     Pain Relieving Factors meds; lying down; heat; ice; DN/manual work              [facility] PT Assessment - 11/18/19 0001      Assessment   Medical Diagnosis strain of muscle, fascia, and tendon of lower back    Referring Provider (PT) Odis Luster, MD    Onset Date/Surgical Date 08/13/19    Hand Dominance Right    Next MD Visit 09/23/19    Prior Therapy none      Observation/Other Assessments   Focus on Therapeutic Outcomes (FOTO)  46% limitation       Sensation   Additional Comments intermittent tingling in the Rt buttock with lifting patients       Posture/Postural Control   Posture Comments thoracic scoliosis, L shoulder lower than R with compensatory head tilt,      Strength   Right Hip Flexion 5/5    Right Hip Extension --   5-/5    Right Hip ABduction 5/5    Left Hip Flexion 5/5    Left Hip Extension --   5-/5   Left Hip ABduction 5/5      Flexibility  Hamstrings WFL's bilat     Quadriceps knees limit prone knee flexion (not quadriceps)      Palpation   Spinal mobility mild limitation low thoracic and lumbar PA mobility    Palpation comment tightness through the thoracolumbar paraspinals and into the posterior hips                          [facility] Adult PT Treatment/Exercise - 11/18/19 0001      Lumbar Exercises: Stretches   Prone on Elbows Stretch 2 reps;30 seconds      Lumbar Exercises: Standing   Scapular Retraction Strengthening;Both;10 reps;Theraband    Theraband Level (Scapular Retraction) Level 2 (Red)    Row Strengthening;Both;10 reps;Theraband    Theraband Level (Row) Level 3 (Green)    Row Limitations bow and arrow green TB  x 10 each UE     Shoulder Extension Strengthening;Both;10 reps;Theraband    Theraband Level (Shoulder Extension) Level 3 (Green)    Other Standing Lumbar Exercises reverse wall push up x 10       Lumbar Exercises: Prone   Other Prone Lumbar Exercises UE/LE lifs x 10       Lumbar Exercises: Quadruped    Madcat/Old Horse 10 reps    Other Quadruped Lumbar Exercises child's pose 20 sec x 3       Moist Heat Therapy   Number Minutes Moist Heat 10 Minutes    Moist Heat Location Lumbar Spine   thoracic left     Electrical Stimulation   Electrical Stimulation Location Lt mid thoracic paraspinals; bilat lumbar/QL    Electrical Stimulation Action IFC    Electrical Stimulation Parameters to tolerance    Electrical Stimulation Goals Pain;Tone      Manual Therapy   Manual Therapy Joint mobilization;Soft tissue mobilization;Myofascial release    Manual therapy comments skilled palpation to assess soft tissue response to DN and manual work     Soft tissue mobilization left thoracic/lumbar/intercostals of lower ribs; lumbar musculature     Myofascial Release Lt thoracic                   PT Education - 11/18/19 1125    Education Details HEP    Person(s) Educated Patient    Methods Explanation;Demonstration;Tactile cues;Verbal cues;Handout    Comprehension Verbalized understanding;Returned demonstration;Verbal cues required;Tactile cues required               PT Long Term Goals - 11/18/19 1146      PT LONG TERM GOAL #1   Title The patient will be indep with HEP.    Time 6    Period Weeks    Status Partially Met      PT LONG TERM GOAL #2   Title The patient will reduce functional limitation per FOTO from 48% limitation to 27% limitation.    Time 6    Period Weeks    Status On-going      PT LONG TERM GOAL #3   Title The patient will report pain at worst a 3/10.    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    Time 6    Period Weeks    Status On-going                  Plan - 11/18/19 1142    Clinical Impression Statement Patient reports continued improvement - about 80% improved overall. She continues to have some episodic increase in "stiffness' and discomfort in the Lt thoracic area and less frequently in the LB. She has had some difficulty with consistently doing her HEP but is working to do exercises daily. Patient has improved mobilty with decreased palpable tightness through the thoracic and lumbar musculature. Patient has responded well to DN reporting good decrease in pain following treatment.    Rehab Potential Good    PT Frequency 2x / week    PT Duration 6 weeks    PT Treatment/Interventions ADLs/Self Care Home Management;Gait training;Stair training;Functional mobility training;Therapeutic activities;Therapeutic exercise;Manual techniques;Taping;Patient/family education;Electrical Stimulation;Moist Heat;Cryotherapy;Passive range of motion    PT Next Visit Plan Note to MD - continue as ordered with treatment to include DN as indicated; continue treatment progressing with strengthening and stabilization    PT Home Exercise Plan Access Code: [email or web address]    Consulted and Agree with Plan of Care Patient           Patient will benefit from skilled therapeutic intervention in order to improve the following deficits and impairments:     Visit Diagnosis: Acute midline low back pain without sciatica  Muscle weakness (generalized)  Abnormal posture  Chronic right shoulder pain     Problem List Patient Active Problem List   Diagnosis Date Noted  . Palpitation 07/13/2018  . Atypical chest pain 07/13/2018  . Family history of heart disease 07/13/2018  . Shortness of breath 07/13/2018  . Sinus tachycardia 07/13/2018  . Acute lateral meniscus tear of left knee 09/01/2012    Celyn P Holt PT, MPH  11/18/2019, 11:59 AM  [facility] [address] [place], [place],  [address] Phone: [phone]   Fax:  [phone]  Name: Dayami L Mesenbrink MRN: [id number] Date of Birth: 09/24/1990  PHYSICAL THERAPY DISCHARGE SUMMARY  Visits from Start of Care: 8  Current functional level related to goals / functional outcomes: See progress note for discharge status    Remaining deficits: Unknown   Education / Equipment: HEP Plan: Patient agrees to discharge.  Patient goals were partially met. Patient is being discharged due to not returning since the last visit.  ?????     Celyn P. Holt PT, MPH 01/27/20 10:45 AM   

## 2019-11-25 DIAGNOSIS — Z118 Encounter for screening for other infectious and parasitic diseases: Secondary | ICD-10-CM | POA: Diagnosis not present

## 2019-11-25 DIAGNOSIS — Z01419 Encounter for gynecological examination (general) (routine) without abnormal findings: Secondary | ICD-10-CM | POA: Diagnosis not present

## 2019-11-25 DIAGNOSIS — Z113 Encounter for screening for infections with a predominantly sexual mode of transmission: Secondary | ICD-10-CM | POA: Diagnosis not present

## 2019-11-25 DIAGNOSIS — Z6841 Body Mass Index (BMI) 40.0 and over, adult: Secondary | ICD-10-CM | POA: Diagnosis not present

## 2019-11-25 DIAGNOSIS — Z114 Encounter for screening for human immunodeficiency virus [HIV]: Secondary | ICD-10-CM | POA: Diagnosis not present

## 2019-11-25 DIAGNOSIS — Z1159 Encounter for screening for other viral diseases: Secondary | ICD-10-CM | POA: Diagnosis not present

## 2019-11-25 DIAGNOSIS — Z3009 Encounter for other general counseling and advice on contraception: Secondary | ICD-10-CM | POA: Diagnosis not present

## 2019-12-07 DIAGNOSIS — L719 Rosacea, unspecified: Secondary | ICD-10-CM | POA: Diagnosis not present

## 2019-12-07 DIAGNOSIS — L858 Other specified epidermal thickening: Secondary | ICD-10-CM | POA: Diagnosis not present

## 2019-12-07 DIAGNOSIS — L814 Other melanin hyperpigmentation: Secondary | ICD-10-CM | POA: Diagnosis not present

## 2019-12-20 DIAGNOSIS — R7301 Impaired fasting glucose: Secondary | ICD-10-CM | POA: Diagnosis not present

## 2019-12-20 DIAGNOSIS — F17200 Nicotine dependence, unspecified, uncomplicated: Secondary | ICD-10-CM | POA: Diagnosis not present

## 2019-12-20 DIAGNOSIS — F900 Attention-deficit hyperactivity disorder, predominantly inattentive type: Secondary | ICD-10-CM | POA: Diagnosis not present

## 2019-12-20 DIAGNOSIS — Z6841 Body Mass Index (BMI) 40.0 and over, adult: Secondary | ICD-10-CM | POA: Diagnosis not present

## 2019-12-20 DIAGNOSIS — K219 Gastro-esophageal reflux disease without esophagitis: Secondary | ICD-10-CM | POA: Diagnosis not present

## 2019-12-20 DIAGNOSIS — F331 Major depressive disorder, recurrent, moderate: Secondary | ICD-10-CM | POA: Diagnosis not present

## 2019-12-20 DIAGNOSIS — N3281 Overactive bladder: Secondary | ICD-10-CM | POA: Diagnosis not present

## 2020-01-31 DIAGNOSIS — R7301 Impaired fasting glucose: Secondary | ICD-10-CM | POA: Diagnosis not present

## 2020-01-31 DIAGNOSIS — Z7689 Persons encountering health services in other specified circumstances: Secondary | ICD-10-CM | POA: Diagnosis not present

## 2020-02-17 ENCOUNTER — Encounter: Payer: Self-pay | Admitting: Gastroenterology

## 2020-02-25 DIAGNOSIS — Z23 Encounter for immunization: Secondary | ICD-10-CM | POA: Diagnosis not present

## 2020-02-29 ENCOUNTER — Other Ambulatory Visit: Payer: Self-pay | Admitting: Orthopedic Surgery

## 2020-04-10 ENCOUNTER — Encounter: Payer: Self-pay | Admitting: Gastroenterology

## 2020-04-10 ENCOUNTER — Ambulatory Visit: Payer: 59 | Admitting: Gastroenterology

## 2020-04-10 ENCOUNTER — Other Ambulatory Visit: Payer: Self-pay | Admitting: Gastroenterology

## 2020-04-10 DIAGNOSIS — R195 Other fecal abnormalities: Secondary | ICD-10-CM | POA: Diagnosis not present

## 2020-04-10 DIAGNOSIS — R12 Heartburn: Secondary | ICD-10-CM

## 2020-04-10 DIAGNOSIS — R194 Change in bowel habit: Secondary | ICD-10-CM

## 2020-04-10 MED ORDER — OMEPRAZOLE 40 MG PO CPDR: DELAYED_RELEASE_CAPSULE | ORAL | 3 refills | Status: DC

## 2020-04-10 MED ORDER — SUPREP BOWEL PREP KIT 17.5-3.13-1.6 GM/177ML PO SOLN: 1.0000 | ORAL | 0 refills | Status: DC

## 2020-04-10 MED FILL — OMEPRAZOLE 40 MG CPDR: 40 | 90 days supply | Qty: 90 | Fill #0

## 2020-04-10 MED FILL — SUPREP BOWEL PREP KIT: 17.5-3.13-1 | 1 days supply | Qty: 354 | Fill #0

## 2020-04-10 NOTE — Patient Instructions (Signed)
If you are age 30 or younger, your body mass index should be between 19-25. Your Body mass index is 42.93 kg/m. If this is out of the aformentioned range listed, please consider follow up with your Primary Care Provider.   You have been scheduled for an endoscopy and colonoscopy. Please follow the written instructions given to you at your visit today. Please pick up your prep supplies at the pharmacy within the next 1-3 days. If you use inhalers (even only as needed), please bring them with you on the day of your procedure.  Due to recent changes in healthcare laws, you may see the results of your imaging and laboratory studies on MyChart before your provider has had a chance to review them.  We understand that in some cases there may be results that are confusing or concerning to you. Not all laboratory results come back in the same time frame and the provider may be waiting for multiple results in order to interpret others.  Please give [place] 48 hours in order for your provider to thoroughly review all the results before contacting the office for clarification of your results.   We have sent the following medications to your pharmacy for you to pick up at your convenience:  INCREASE: omeprazole to 40mg  one capsule 20 to 30 minutes prior to breakfast meal each day.  Please decrease caffeine intake to help reduce heartburn.  Thank you for entrusting me with your care and choosing [facility].  Dr Ardis Hughs

## 2020-04-10 NOTE — Progress Notes (Signed)
HPI: This is a very pleasant 29 year old woman who was referred to me by Tisovec, Fransico Him, MD  to evaluate heartburn, change in bowels, loose stools, left upper quadrant pain.    She has had GI issues both upper and lower for several months, probably 8 or 9 months.  They seem to be getting worse over time.  She describes periodic intermittent left upper quadrant ache.  She is also noticed a change in her bowel habits.  They are looser than usual.  She has noticed changing her bowel movement color, she says she has a "vast color wheel" with her stool.  She has noticed some very minor rectal bleeding with wiping.  Her weight is overall stable.  She describes heartburn especially positionally when bending over.  She has tried pop Tums, she has tried Pepto-Bismol.  She had significant blood tests done, see those below.  Based on those test she was put on H. pylori type antibiotics and felt great for 2 weeks.  Her symptoms returned shortly after she stopped those antibiotics.  She takes omeprazole 20 mg pills, sometimes 40 mg pills.  She takes this about 50% of days.  She drinks quite a lot of caffeine, both coffee in the morning and also Cokes zeros throughout the day.  She is morbidly obese, her weight is overall stable.   Old Data Reviewed: Echocardiogram October 2020 showed left ventricular ejection fraction 60 to 65%  Lab tests October 7124 complete metabolic profile was normal, H. pylori blood tests IgA was positive.  H. pylori IgG and IgM were both negative, TTG negative, total IgA normal at 237, hemoglobin A1c 5.5 urinalysis was normal  CBC January 2021 was normal  Review of systems: Pertinent positive and negative review of systems were noted in the above HPI section. All other review negative.   Past Medical History:  Diagnosis Date  . Acute meniscal tear of knee    left knee  . Allergy   . Anxiety   . Asthma   . Ovarian cyst   . Shingles   . Tachycardia     Past Surgical  History:  Procedure Laterality Date  . KNEE ARTHROSCOPY WITH LATERAL MENISECTOMY Left 09/01/2012   Procedure: LEFT KNEE ARTHROSCOPY WITH LATERAL MENISCECTOMY ;  Surgeon: Tobi Bastos, MD;  Location: [facility];  Service: Orthopedics;  Laterality: Left;  . RIGHT INDEX  FINGER TENDON REPAIR  NOV 2013    Current Outpatient Medications  Medication Sig Dispense Refill  . ADDERALL XR 20 MG 24 hr capsule     . albuterol (PROVENTIL HFA;VENTOLIN HFA) 108 (90 Base) MCG/ACT inhaler Inhale 2 puffs into the lungs every 4 (four) hours as needed for wheezing or shortness of breath (cough, shortness of breath or wheezing.). (Patient not taking: Reported on 09/02/2019) 1 Inhaler 0  . etonogestrel (NEXPLANON) 68 MG IMPL implant 1 each by Subdermal route once.    Marland Kitchen FLUoxetine (PROZAC) 10 MG capsule Take 1 capsule (10 mg total) by mouth daily. (Patient not taking: Reported on 09/02/2019) 30 capsule 0  . meloxicam (MOBIC) 7.5 MG tablet daily as needed.     . mirabegron ER (MYRBETRIQ) 25 MG TB24 tablet Take 50 mg by mouth daily.     . naproxen sodium (ALEVE) 220 MG tablet Take 220 mg by mouth daily as needed.    . silver sulfADIAZINE (SILVADENE) 1 % cream Apply 1 application topically 2 (two) times daily. 50 g 0   No current facility-administered medications for  this visit.    Allergies as of 04/10/2020 - Review Complete 04/10/2020  Allergen Reaction Noted  . Raspberry Swelling 08/27/2012    Family History  Problem Relation Age of Onset  . Hyperlipidemia Mother   . Diabetes Father   . Heart attack Father 67  . Asthma Sister   . Hyperlipidemia Maternal Grandmother   . Hyperlipidemia Maternal Grandfather   . Cancer Maternal Grandfather   . Hyperlipidemia Paternal Grandmother   . Heart disease Paternal Grandmother 49    Social History   Socioeconomic History  . Marital status: Single    Spouse name: Not on file  . Number of children: 0  . Years of education: Not on file  . Highest  education level: Not on file  Occupational History  . Not on file  Tobacco Use  . Smoking status: Current Every Day Smoker    Packs/day: 0.50    Years: 5.00    Pack years: 2.50    Types: Cigarettes    Last attempt to quit: 07/12/2018    Years since quitting: 1.7  . Smokeless tobacco: Never Used  . Tobacco comment:    1 PP3D  Vaping Use  . Vaping Use: Never used  Substance and Sexual Activity  . Alcohol use: Yes    Alcohol/week: 6.0 standard drinks    Types: 6 Standard drinks or equivalent per week    Comment: 2 times week  . Drug use: No  . Sexual activity: Not Currently  Other Topics Concern  . Not on file  Social History Narrative  . Not on file   Social Determinants of Health   Financial Resource Strain: Not on file  Food Insecurity: Not on file  Transportation Needs: Not on file  Physical Activity: Not on file  Stress: Not on file  Social Connections: Not on file  Intimate Partner Violence: Not on file     Physical Exam: Ht 5' 4.5" (1.638 m) Comment: height measured without shoes  Wt 254 lb (115.2 kg)   BMI 42.93 kg/m  Constitutional: generally well-appearing, except for obesity Psychiatric: alert and oriented x3 Eyes: extraocular movements intact Mouth: oral pharynx moist, no lesions Neck: supple no lymphadenopathy Cardiovascular: heart regular rate and rhythm Lungs: clear to auscultation bilaterally Abdomen: soft, nontender, nondistended, no obvious ascites, no peritoneal signs, normal bowel sounds Extremities: no lower extremity edema bilaterally Skin: no lesions on visible extremities   Assessment and plan: 29 y.o. female with morbid obesity, chronic loose stools, intermittent left upper quadrant pain, heartburn, change in bowel habits, abnormal stool color  She has myriad of upper and lower GI symptoms.  I suspect a lot of this is possibly functional.  She has seen some very minor blood in her stool.  I recommended she resume 40 mg omeprazole and I  am calling her in a prescription for this.  She understands she should take 1 pill shortly before her breakfast meal every morning.  I recommended she try to cut back on her caffeine intake as she admittedly drinks too much caffeine overall.  I recommended further testing with colonoscopy and upper endoscopy at her soonest convenience.  I see no reason for any further blood tests or imaging studies prior to then.    Please see the "Patient Instructions" section for addition details about the plan.   Owens Loffler, MD [facility] 04/10/2020, 11:21 AM  Cc: Haywood Pao, MD  Total time on date of encounter was  45  minutes (this included time  spent preparing to see the patient reviewing records; obtaining and/or reviewing separately obtained history; performing a medically appropriate exam and/or evaluation; counseling and educating the patient and family if present; ordering medications, tests or procedures if applicable; and documenting clinical information in the health record).

## 2020-04-20 ENCOUNTER — Encounter: Payer: Self-pay | Admitting: Nurse Practitioner

## 2020-04-20 DIAGNOSIS — R0981 Nasal congestion: Secondary | ICD-10-CM | POA: Diagnosis not present

## 2020-04-20 DIAGNOSIS — J029 Acute pharyngitis, unspecified: Secondary | ICD-10-CM | POA: Diagnosis not present

## 2020-04-20 DIAGNOSIS — U071 COVID-19: Secondary | ICD-10-CM | POA: Diagnosis not present

## 2020-04-20 DIAGNOSIS — Z1152 Encounter for screening for COVID-19: Secondary | ICD-10-CM | POA: Diagnosis not present

## 2020-04-20 DIAGNOSIS — Z6841 Body Mass Index (BMI) 40.0 and over, adult: Secondary | ICD-10-CM | POA: Diagnosis not present

## 2020-04-21 ENCOUNTER — Telehealth: Payer: 59 | Admitting: Nurse Practitioner

## 2020-04-21 DIAGNOSIS — U071 COVID-19: Secondary | ICD-10-CM

## 2020-04-21 MED ORDER — NAPROXEN 500 MG PO TABS: 500.0000 mg | ORAL_TABLET | Freq: Two times a day (BID) | ORAL | 1 refills | Status: DC

## 2020-04-21 NOTE — Addendum Note (Signed)
Addended by: Chevis Pretty on: 04/21/2020 03:02 PM   Modules accepted: Orders

## 2020-04-21 NOTE — Progress Notes (Signed)
E-Visit for Corona Virus Screening  We are sorry you are not feeling well. We are here to help!  You have tested positive for COVID-19, meaning that you were infected with the novel coronavirus and could give the virus to others.  It is vitally important that you stay home so you do not spread it to others.      Please continue isolation at home, for at least 10 days since the start of your symptoms and until you have had 24 hours with no fever (without taking a fever reducer) and with improving of symptoms.  Most cases improve 10 days from onset but we have seen a small number of patients who have gotten worse after the 10 days.  Please be sure to watch for worsening symptoms and remain taking the proper precautions.   Go to the nearest hospital ED for assessment if fever/cough/breathlessness are severe or illness seems like a threat to life.    The following symptoms may appear 2-14 days after exposure: . Fever . Cough . Shortness of breath or difficulty breathing . Chills . Repeated shaking with chills . Muscle pain . Headache . Sore throat . New loss of taste or smell . Fatigue . Congestion or runny nose . Nausea or vomiting . Diarrhea  You have been enrolled in [facility] for COVID-19. Daily you will receive a questionnaire within the [facility] website. Our COVID-19 response team will be monitoring your responses daily.  You can use medication such as A prescription anti-inflammatory called Naprosyn 500 mg. Take twice daily as needed for fever or body aches for 2 weeks    You may also take acetaminophen (Tylenol) as needed for fever.  HOME CARE: . Only take medications as instructed by your medical team. . Drink plenty of fluids and get plenty of rest. . A steam or ultrasonic humidifier can help if you have congestion.   GET HELP RIGHT AWAY IF YOU HAVE EMERGENCY WARNING SIGNS.  Call 911 or proceed to your closest emergency facility if: . You develop worsening  high fever. . Trouble breathing . Bluish lips or face . Persistent pain or pressure in the chest . New confusion . Inability to wake or stay awake . You cough up blood. . Your symptoms become more severe . Inability to hold down food or fluids  This list is not all possible symptoms. Contact your medical provider for any symptoms that are severe or concerning to you.    Your e-visit answers were reviewed by a board certified advanced clinical practitioner to complete your personal care plan.  Depending on the condition, your plan could have included both over the counter or prescription medications.  If there is a problem please reply once you have received a response from your provider.  Your safety is important to [place].  If you have drug allergies check your prescription carefully.    You can use MyChart to ask questions about today's visit, request a non-urgent call back, or ask for a work or school excuse for 24 hours related to this e-Visit. If it has been greater than 24 hours you will need to follow up with your provider, or enter a new e-Visit to address those concerns. You will get an e-mail in the next two days asking about your experience.  I hope that your e-visit has been valuable and will speed your recovery. Thank you for using e-visits.   5-10 minutes spent reviewing and documenting in chart.

## 2020-04-21 NOTE — Progress Notes (Signed)
You submitted the wrong questionnaire in order to be treated. You need to go back and do sinusitis questionnaire so that I can determine proper treatment. You selected the positive covid questionnaire. If you did not test positive for covid , then redo your evisit using sinusitis questionnaire.

## 2020-04-29 DIAGNOSIS — U071 COVID-19: Secondary | ICD-10-CM

## 2020-04-29 HISTORY — DX: COVID-19: U07.1

## 2020-05-02 ENCOUNTER — Telehealth: Payer: Self-pay | Admitting: *Deleted

## 2020-05-02 ENCOUNTER — Ambulatory Visit (INDEPENDENT_AMBULATORY_CARE_PROVIDER_SITE_OTHER): Payer: No Typology Code available for payment source

## 2020-05-02 DIAGNOSIS — R002 Palpitations: Secondary | ICD-10-CM

## 2020-05-02 NOTE — Telephone Encounter (Signed)
New Message:      Pt said to please stop the order for the Monitor asap. Ins will not pay for it, she must have the referral first. If she receives it now, they will not pay for it.

## 2020-05-02 NOTE — Telephone Encounter (Signed)
Returned call to patient and answered all her questions. She is also going to call [employer] and get a price quote and knows to mail monitor back if she is unable to afford it.

## 2020-06-05 ENCOUNTER — Other Ambulatory Visit: Payer: Self-pay | Admitting: Internal Medicine

## 2020-06-06 ENCOUNTER — Other Ambulatory Visit: Payer: Self-pay

## 2020-06-06 ENCOUNTER — Encounter: Payer: Self-pay | Admitting: Gastroenterology

## 2020-06-06 ENCOUNTER — Ambulatory Visit (AMBULATORY_SURGERY_CENTER): Payer: No Typology Code available for payment source | Admitting: Gastroenterology

## 2020-06-06 VITALS — BP 108/82 | HR 73 | Temp 98.6°F | Resp 19 | Ht 64.0 in | Wt 254.0 lb

## 2020-06-06 DIAGNOSIS — R12 Heartburn: Secondary | ICD-10-CM | POA: Diagnosis not present

## 2020-06-06 DIAGNOSIS — R1084 Generalized abdominal pain: Secondary | ICD-10-CM | POA: Diagnosis not present

## 2020-06-06 DIAGNOSIS — R1310 Dysphagia, unspecified: Secondary | ICD-10-CM

## 2020-06-06 DIAGNOSIS — K2970 Gastritis, unspecified, without bleeding: Secondary | ICD-10-CM

## 2020-06-06 DIAGNOSIS — R195 Other fecal abnormalities: Secondary | ICD-10-CM

## 2020-06-06 DIAGNOSIS — R194 Change in bowel habit: Secondary | ICD-10-CM

## 2020-06-06 DIAGNOSIS — K529 Noninfective gastroenteritis and colitis, unspecified: Secondary | ICD-10-CM | POA: Diagnosis present

## 2020-06-06 MED ORDER — SODIUM CHLORIDE 0.9 % IV SOLN: 500.0000 mL | INTRAVENOUS | Status: DC

## 2020-06-06 NOTE — Progress Notes (Signed)
A and O x3. Report to RN. Tolerated MAC anesthesia well.Teeth unchanged after procedure.

## 2020-06-06 NOTE — Progress Notes (Signed)
Called to room to assist during endoscopic procedure.  Patient ID and intended procedure confirmed with present staff. Received instructions for my participation in the procedure from the performing physician.  

## 2020-06-06 NOTE — Op Note (Signed)
[facility] Patient Name: Theresa Ortiz Procedure Date: 06/06/2020 7:44 AM MRN: [id number] Endoscopist: Milus Banister , MD Age: 30 Referring MD:  Date of Birth: [birth year] Gender: Female Account #: [id number] Procedure:                Colonoscopy Indications:              Generalized abdominal pain, Chronic intermittent                            loose stools Medicines:                Monitored Anesthesia Care Procedure:                Pre-Anesthesia Assessment:                           - Prior to the procedure, a History and Physical                            was performed, and patient medications and                            allergies were reviewed. The patient's tolerance of                            previous anesthesia was also reviewed. The risks                            and benefits of the procedure and the sedation                            options and risks were discussed with the patient.                            All questions were answered, and informed consent                            was obtained. Prior Anticoagulants: The patient has                            taken no previous anticoagulant or antiplatelet                            agents. ASA Grade Assessment: III - A patient with                            severe systemic disease. After reviewing the risks                            and benefits, the patient was deemed in                            satisfactory condition to undergo the procedure.  After obtaining informed consent, the colonoscope                            was passed under direct vision. Throughout the                            procedure, the patient's blood pressure, pulse, and                            oxygen saturations were monitored continuously. The                            Colonoscope was introduced through the anus and                            advanced to the the terminal ileum. The  colonoscopy                            was performed without difficulty. The patient                            tolerated the procedure well. The quality of the                            bowel preparation was good. The terminal ileum,                            ileocecal valve, appendiceal orifice, and rectum                            were photographed. Scope In: 8:06:04 AM Scope Out: 8:15:22 AM Scope Withdrawal Time: 0 hours 7 minutes 7 seconds  Total Procedure Duration: 0 hours 9 minutes 18 seconds  Findings:                 The terminal ileum appeared normal.                           Biopsies for histology were taken with a cold                            forceps from the entire colon for evaluation of                            microscopic colitis.                           The exam was otherwise without abnormality on                            direct and retroflexion views. Complications:            No immediate complications. Estimated blood loss:                            None. Estimated  Blood Loss:     Estimated blood loss: none. Impression:               - The examined portion of the ileum was normal.                           - The examination was otherwise normal on direct                            and retroflexion views.                           - Biopsies were taken with a cold forceps from the                            entire colon for evaluation of microscopic colitis. Recommendation:           - EGD now.                           - Continue with less caffeine in your diet, it                            sounds like this has really helped overall.                           - Consider colon cancer screening with a                            colonoscopy at age 48 (routine risk screening). Milus Banister, MD 06/06/2020 8:18:29 AM This report has been signed electronically.

## 2020-06-06 NOTE — Patient Instructions (Signed)
Continue with less caffeine in your diet, it sounds like this has really helped overall.   Handout provided on gastritis.   YOU HAD AN ENDOSCOPIC PROCEDURE TODAY AT [facility]:   Refer to the procedure report that was given to you for any specific questions about what was found during the examination.  If the procedure report does not answer your questions, please call your gastroenterologist to clarify.  If you requested that your care partner not be given the details of your procedure findings, then the procedure report has been included in a sealed envelope for you to review at your convenience later.  YOU SHOULD EXPECT: Some feelings of bloating in the abdomen. Passage of more gas than usual.  Walking can help get rid of the air that was put into your GI tract during the procedure and reduce the bloating. If you had a lower endoscopy (such as a colonoscopy or flexible sigmoidoscopy) you may notice spotting of blood in your stool or on the toilet paper. If you underwent a bowel prep for your procedure, you may not have a normal bowel movement for a few days.  Please Note:  You might notice some irritation and congestion in your nose or some drainage.  This is from the oxygen used during your procedure.  There is no need for concern and it should clear up in a day or so.  SYMPTOMS TO REPORT IMMEDIATELY:   Following lower endoscopy (colonoscopy or flexible sigmoidoscopy):  Excessive amounts of blood in the stool  Significant tenderness or worsening of abdominal pains  Swelling of the abdomen that is new, acute  Fever of 100F or higher   Following upper endoscopy (EGD)  Vomiting of blood or coffee ground material  New chest pain or pain under the shoulder blades  Painful or persistently difficult swallowing  New shortness of breath  Fever of 100F or higher  Black, tarry-looking stools  For urgent or emergent issues, a gastroenterologist can be reached at any hour by  calling [phone]. Do not use MyChart messaging for urgent concerns.    DIET:  We do recommend a small meal at first, but then you may proceed to your regular diet.  Drink plenty of fluids but you should avoid alcoholic beverages for 24 hours.  ACTIVITY:  You should plan to take it easy for the rest of today and you should NOT DRIVE or use heavy machinery until tomorrow (because of the sedation medicines used during the test).    FOLLOW UP: Our staff will call the number listed on your records 48-72 hours following your procedure to check on you and address any questions or concerns that you may have regarding the information given to you following your procedure. If we do not reach you, we will leave a message.  We will attempt to reach you two times.  During this call, we will ask if you have developed any symptoms of COVID 19. If you develop any symptoms (ie: fever, flu-like symptoms, shortness of breath, cough etc.) before then, please call [phone].  If you test positive for Covid 19 in the 2 weeks post procedure, please call and report this information to [place].    If any biopsies were taken you will be contacted by phone or by letter within the next 1-3 weeks.  Please call us at [phone] if you have not heard about the biopsies in 3 weeks.    SIGNATURES/CONFIDENTIALITY: You and/or your care partner have signed  paperwork which will be entered into your electronic medical record.  These signatures attest to the fact that that the information above on your After Visit Summary has been reviewed and is understood.  Full responsibility of the confidentiality of this discharge information lies with you and/or your care-partner.

## 2020-06-06 NOTE — Op Note (Signed)
[facility] Patient Name: Theresa Ortiz Procedure Date: 06/06/2020 7:44 AM MRN: [id number] Endoscopist: Milus Banister , MD Age: 30 Referring MD:  Date of Birth: [birth year] Gender: Female Account #: [id number] Procedure:                Upper GI endoscopy Indications:              Dyspepsia, Heartburn Medicines:                Monitored Anesthesia Care Procedure:                Pre-Anesthesia Assessment:                           - Prior to the procedure, a History and Physical                            was performed, and patient medications and                            allergies were reviewed. The patient's tolerance of                            previous anesthesia was also reviewed. The risks                            and benefits of the procedure and the sedation                            options and risks were discussed with the patient.                            All questions were answered, and informed consent                            was obtained. Prior Anticoagulants: The patient has                            taken no previous anticoagulant or antiplatelet                            agents. ASA Grade Assessment: III - A patient with                            severe systemic disease. After reviewing the risks                            and benefits, the patient was deemed in                            satisfactory condition to undergo the procedure.                           After obtaining informed consent, the endoscope was  passed under direct vision. Throughout the                            procedure, the patient's blood pressure, pulse, and                            oxygen saturations were monitored continuously. The                            Endoscope was introduced through the mouth, and                            advanced to the second part of duodenum. The upper                            GI endoscopy was  accomplished without difficulty.                            The patient tolerated the procedure well. Scope In: Scope Out: Findings:                 Minimal inflammation characterized by erythema was                            found in the gastric antrum. Biopsies were taken                            with a cold forceps for histology.                           The exam was otherwise without abnormality. Complications:            No immediate complications. Estimated blood loss:                            None. Estimated Blood Loss:     Estimated blood loss was minimal. Impression:               - Mild, non-specific gastritis. Biopsied to check                            for H. pylori.                           - The examination was otherwise normal. Recommendation:           - Patient has a contact number available for                            emergencies. The signs and symptoms of potential                            delayed complications were discussed with the                            patient. Return to normal activities tomorrow.  Written discharge instructions were provided to the                            patient.                           - Resume previous diet.                           - Continue present medications.                           - Await pathology results. Milus Banister, MD 06/06/2020 8:25:35 AM This report has been signed electronically.

## 2020-06-06 NOTE — Progress Notes (Signed)
Pt's states no medical or surgical changes since previsit or office visit. 

## 2020-06-07 ENCOUNTER — Encounter: Payer: Self-pay | Admitting: General Practice

## 2020-06-08 ENCOUNTER — Encounter: Payer: Self-pay | Admitting: General Practice

## 2020-06-08 ENCOUNTER — Telehealth: Payer: Self-pay

## 2020-06-08 NOTE — Telephone Encounter (Signed)
  Follow up Call-  Call back number 06/06/2020  Post procedure Call Back phone  # [phone]  Permission to leave phone message Yes  Some recent data might be hidden     Patient questions:  Do you have a fever, pain , or abdominal swelling? No. Pain Score  0 *  Have you tolerated food without any problems? Yes.    Have you been able to return to your normal activities? Yes.    Do you have any questions about your discharge instructions: Diet   No. Medications  No. Follow up visit  No.  Do you have questions or concerns about your Care? No.  Actions: * If pain score is 4 or above: No action needed, pain <4. 1. Have you developed a fever since your procedure? no  2.   Have you had an respiratory symptoms (SOB or cough) since your procedure? no  3.   Have you tested positive for COVID 19 since your procedure no  4.   Have you had any family members/close contacts diagnosed with the COVID 19 since your procedure?  no   If yes to any of these questions please route to Joylene John, RN and Joella Prince, RN

## 2020-07-04 ENCOUNTER — Other Ambulatory Visit: Payer: Self-pay

## 2020-07-04 MED ORDER — METOPROLOL SUCCINATE ER 25 MG PO TB24: 25.0000 mg | ORAL_TABLET | Freq: Every day | ORAL | 3 refills | Status: DC

## 2020-07-11 ENCOUNTER — Other Ambulatory Visit: Payer: Self-pay | Admitting: Internal Medicine

## 2020-07-16 NOTE — Progress Notes (Signed)
Cardiology Office Note:   Date:  07/17/2020  NAME:  Theresa Ortiz    MRN: [id number] DOB:  1991/04/15   PCP:  Haywood Pao, MD  Cardiologist:  Evalina Field, MD  Electrophysiologist:  None   Referring MD: Haywood Pao, MD   Chief Complaint  Patient presents with  . Follow-up    2 weeks.   History of Present Illness:   Theresa Ortiz is a 30 y.o. female with a hx of obesity, anxiety, tobacco abuse who presents for follow-up. Was having palpitations. Monitor shows PACs.  We started her on metoprolol.  She reports her symptoms have improved.  She is still having symptoms 5 times per day.  They are less forceful.  She reports brief extra heartbeat sensation.  She also is having her chest pain episodes which are normal.  She has cut down on caffeine consumption.  She was drinking 6 cups of coffee per day.  She is now down to 1-2.  She quit smoking in October.  She is still not exercising.  We have discussed that this is important.  She is still morbidly obese with a BMI of 44.  She reports she does not sleep well.  We discussed pursuing a sleep study.  She reports that she does not snore and is not excessively fatigued.  She would like to hold on this for now.  Her blood pressure is 90/68.  She reports no dizziness or lightheadedness.  She also is on Adderall.  This could clearly exacerbate her symptoms of PACs.  She is going to look into alternative agents.  She is not drinking nearly enough water.  She drinks 2 to 312 ounce cups per day.  She needs to drink at least 6-8.  Her EKG in office shows sinus rhythm.  Cardiovascular examination is normal.  She had an echocardiogram in 2020 that was normal.  Problem List 1. Obesity -BMI 44 2. Tobacco abuse  3. PACs -3.9% burden -Echo normal  Past Medical History: Past Medical History:  Diagnosis Date  . Acute meniscal tear of knee    left knee  . Allergy   . Anxiety   . Asthma   . Atrial fibrillation (HCC)    slight   . COVID-19 04/2020  . Depression   . Ovarian cyst   . PVC (premature ventricular contraction)   . Scoliosis   . Shingles   . Tachycardia     Past Surgical History: Past Surgical History:  Procedure Laterality Date  . KNEE ARTHROSCOPY WITH LATERAL MENISECTOMY Left 09/01/2012   Procedure: LEFT KNEE ARTHROSCOPY WITH LATERAL MENISCECTOMY ;  Surgeon: Tobi Bastos, MD;  Location: [facility];  Service: Orthopedics;  Laterality: Left;  . MENISCUS REPAIR Right   . RIGHT INDEX  FINGER TENDON REPAIR  NOV 2013    Current Medications: Current Meds  Medication Sig  . ADDERALL XR 20 MG 24 hr capsule   . albuterol (VENTOLIN HFA) 108 (90 Base) MCG/ACT inhaler Inhale 1 puff into the lungs as needed.  . cyclobenzaprine (FLEXERIL) 5 MG tablet Take 5 mg by mouth 2 (two) times daily.  Marland Kitchen FLUoxetine (PROZAC) 10 MG capsule Take 1 capsule (10 mg total) by mouth daily.  . meloxicam (MOBIC) 15 MG tablet Take 15 mg by mouth daily.  . methocarbamol (ROBAXIN) 500 MG tablet Take 1 tablet by mouth daily.  . metoprolol succinate (TOPROL-XL) 25 MG 24 hr tablet Take 1 tablet (25 mg total) by mouth  daily. Take with or immediately following a meal.  . naproxen (NAPROSYN) 500 MG tablet Take 1 tablet (500 mg total) by mouth 2 (two) times daily with a meal.  . naproxen sodium (ALEVE) 220 MG tablet Take 220 mg by mouth daily as needed.  Marland Kitchen omeprazole (PRILOSEC) 40 MG capsule Take 20 -30 minutes prior to breakfast everyday     Allergies:    Raspberry   Social History: Social History   Socioeconomic History  . Marital status: Single    Spouse name: Not on file  . Number of children: 0  . Years of education: Not on file  . Highest education level: Not on file  Occupational History  . Occupation: Garment/textile technologist  Tobacco Use  . Smoking status: Former Smoker    Packs/day: 0.50    Years: 5.00    Pack years: 2.50    Types: Cigarettes    Quit date: 11/2019    Years since quitting: 0.6  . Smokeless  tobacco: Never Used  . Tobacco comment:    1 PP3D  Vaping Use  . Vaping Use: Never used  Substance and Sexual Activity  . Alcohol use: Yes    Alcohol/week: 6.0 standard drinks    Types: 6 Standard drinks or equivalent per week    Comment: 2 times week  . Drug use: No  . Sexual activity: Not Currently  Other Topics Concern  . Not on file  Social History Narrative  . Not on file   Social Determinants of Health   Financial Resource Strain: Not on file  Food Insecurity: Not on file  Transportation Needs: Not on file  Physical Activity: Not on file  Stress: Not on file  Social Connections: Not on file     Family History: The patient's family history includes Asthma in her sister; Bladder Cancer in her maternal grandfather; Colon cancer in her maternal grandmother; Diabetes in her father; Heart attack in her paternal grandmother; Heart attack (age of onset: 86) in her father; Heart disease (age of onset: 65) in her paternal grandmother; Hyperlipidemia in her maternal grandfather, maternal grandmother, mother, and paternal grandmother; Kidney Stones in her sister; Prostate cancer in her father; Thyroid cancer in her paternal grandfather.  ROS:   All other ROS reviewed and negative. Pertinent positives noted in the HPI.     EKGs/Labs/Other Studies Reviewed:   The following studies were personally reviewed by me today:  EKG:  EKG is ordered today.  The ekg ordered today demonstrates normal sinus rhythm heart rate 87, no acute ischemic changes or evidence of infarction, and was personally reviewed by me.   ETT 03/16/2019 9:00 min:s 10.2 METS Negative for ischemia   TTE 02/25/2019 1. Left ventricular ejection fraction, by visual estimation, is 60 to  65%. The left ventricle has normal function. There is no left ventricular  hypertrophy.  2. Global right ventricle has normal systolic function.The right  ventricular size is normal. No increase in right ventricular wall   thickness.  3. Left atrial size was normal.  4. Right atrial size was normal.  5. The mitral valve is normal in structure. Trace mitral valve  regurgitation. No evidence of mitral stenosis.  6. The tricuspid valve is normal in structure. Tricuspid valve  regurgitation is trivial.  7. The aortic valve is normal in structure. Aortic valve regurgitation is  not visualized.  8. The pulmonic valve was normal in structure. Pulmonic valve  regurgitation is not visualized.  9. The atrial septum is grossly  normal.   Recent Labs: No results found for requested labs within last 8760 hours.   Recent Lipid Panel No results found for: CHOL, TRIG, HDL, CHOLHDL, VLDL, LDLCALC, LDLDIRECT  Physical Exam:   VS:  BP 98/72 (BP Location: Right Arm, Patient Position: Sitting, Cuff Size: Large)   Pulse 87   Ht 5' 4.5" (1.638 m)   Wt 257 lb (116.6 kg)   BMI 43.43 kg/m    Wt Readings from Last 3 Encounters:  07/17/20 257 lb (116.6 kg)  06/06/20 254 lb (115.2 kg)  04/10/20 254 lb (115.2 kg)    General: Well nourished, well developed, in no acute distress Head: Atraumatic, normal size  Eyes: PEERLA, EOMI  Neck: Supple, no JVD Endocrine: No thryomegaly Cardiac: Normal S1, S2; RRR; no murmurs, rubs, or gallops Lungs: Clear to auscultation bilaterally, no wheezing, rhonchi or rales  Abd: Soft, nontender, no hepatomegaly  Ext: No edema, pulses 2+ Musculoskeletal: No deformities, BUE and BLE strength normal and equal Skin: Warm and dry, no rashes   Neuro: Alert and oriented to person, place, time, and situation, CNII-XII grossly intact, no focal deficits  Psych: Normal mood and affect   ASSESSMENT:   Theresa Ortiz is a 30 y.o. female who presents for the following: 1. Palpitations   2. PAC (premature atrial contraction)     PLAN:   1. Palpitations 2. PAC (premature atrial contraction) -Recent episode of palpitations.  PAC burden roughly 4%.  Symptoms have improved on metoprolol  succinate.  I would like to check a TSH today.  She is not had one checked in over 1 year.  I think these are related to poor sleep, stress and anxiety as well as excess caffeine consumption.  She has worked on reducing her caffeine consumption.  I would like for her to get more active.  I think exercise would actually help this.  We also discussed the possibility of sleep apnea.  She reports she would like to hold on a sleep study for now.  She does need to lose weight.  She will work on this as well.  Clearly this is not helping.  She does understand what she needs to do.  She is also working on stress reduction strategies.  She reports work is still stressful but she is managing it.  Symptoms have improved overall metoprolol.  EKG shows sinus rhythm.  Her cardiovascular examination is normal.  She had a normal stress test and normal echocardiogram roughly 2 years ago.  I see no need for further evaluation on this.  These are not related to CAD.  She is also too young for this.  Disposition: Return in about 3 months (around 10/17/2020).  Medication Adjustments/Labs and Tests Ordered: Current medicines are reviewed at length with the patient today.  Concerns regarding medicines are outlined above.  Orders Placed This Encounter  Procedures  . TSH  . EKG 12-Lead   No orders of the defined types were placed in this encounter.   Patient Instructions  Medication Instructions:  The current medical regimen is effective;  continue present plan and medications.  *If you need a refill on your cardiac medications before your next appointment, please call your pharmacy*   Lab Work: TSH today  If you have labs (blood work) drawn today and your tests are completely normal, you will receive your results only by: Marland Kitchen MyChart Message (if you have MyChart) OR . A paper copy in the mail If you have any lab test that  is abnormal or we need to change your treatment, we will call you to review the  results.   Follow-Up: At [facility], you and your health needs are our priority.  As part of our continuing mission to provide you with exceptional heart care, we have created designated Provider Care Teams.  These Care Teams include your primary Cardiologist (physician) and Advanced Practice Providers (APPs -  Physician Assistants and Nurse Practitioners) who all work together to provide you with the care you need, when you need it.  We recommend signing up for the patient portal called "MyChart".  Sign up information is provided on this After Visit Summary.  MyChart is used to connect with patients for Virtual Visits (Telemedicine).  Patients are able to view lab/test results, encounter notes, upcoming appointments, etc.  Non-urgent messages can be sent to your provider as well.   To learn more about what you can do with MyChart, go to [email or web address].    Your next appointment:   3 month(s)  The format for your next appointment:   In Person  Provider:   Eleonore Chiquito, MD        Time Spent with Patient: I have spent a total of 25 minutes with patient reviewing hospital notes, telemetry, EKGs, labs and examining the patient as well as establishing an assessment and plan that was discussed with the patient.  > 50% of time was spent in direct patient care.  Signed, Addison Naegeli. Audie Box, MD, [place]  [address], [place], [place] [address] [phone]  07/17/2020 12:01 PM

## 2020-07-17 ENCOUNTER — Other Ambulatory Visit: Payer: Self-pay

## 2020-07-17 ENCOUNTER — Ambulatory Visit (INDEPENDENT_AMBULATORY_CARE_PROVIDER_SITE_OTHER): Payer: No Typology Code available for payment source | Admitting: Cardiovascular Disease

## 2020-07-17 ENCOUNTER — Encounter: Payer: Self-pay | Admitting: Cardiovascular Disease

## 2020-07-17 VITALS — BP 98/72 | HR 87 | Ht 64.5 in | Wt 257.0 lb

## 2020-07-17 DIAGNOSIS — I491 Atrial premature depolarization: Secondary | ICD-10-CM

## 2020-07-17 DIAGNOSIS — R002 Palpitations: Secondary | ICD-10-CM | POA: Diagnosis not present

## 2020-07-17 DIAGNOSIS — Z720 Tobacco use: Secondary | ICD-10-CM

## 2020-07-17 NOTE — Patient Instructions (Signed)
Medication Instructions:  The current medical regimen is effective;  continue present plan and medications.  *If you need a refill on your cardiac medications before your next appointment, please call your pharmacy*   Lab Work: TSH today  If you have labs (blood work) drawn today and your tests are completely normal, you will receive your results only by: Marland Kitchen MyChart Message (if you have MyChart) OR . A paper copy in the mail If you have any lab test that is abnormal or we need to change your treatment, we will call you to review the results.   Follow-Up: At [facility], you and your health needs are our priority.  As part of our continuing mission to provide you with exceptional heart care, we have created designated Provider Care Teams.  These Care Teams include your primary Cardiologist (physician) and Advanced Practice Providers (APPs -  Physician Assistants and Nurse Practitioners) who all work together to provide you with the care you need, when you need it.  We recommend signing up for the patient portal called "MyChart".  Sign up information is provided on this After Visit Summary.  MyChart is used to connect with patients for Virtual Visits (Telemedicine).  Patients are able to view lab/test results, encounter notes, upcoming appointments, etc.  Non-urgent messages can be sent to your provider as well.   To learn more about what you can do with MyChart, go to [email or web address].    Your next appointment:   3 month(s)  The format for your next appointment:   In Person  Provider:   Eleonore Chiquito, MD

## 2020-07-18 LAB — TSH: TSH: 1.42 u[IU]/mL (ref 0.450–4.500)

## 2020-09-01 IMAGING — CR DG CHEST 2V
2 series · 2 of 2 positions shown · non-contrast
Comparison: 09/20/2013.

CLINICAL DATA: Chest pain.

EXAM:
CHEST - 2 VIEW

[chest pa]
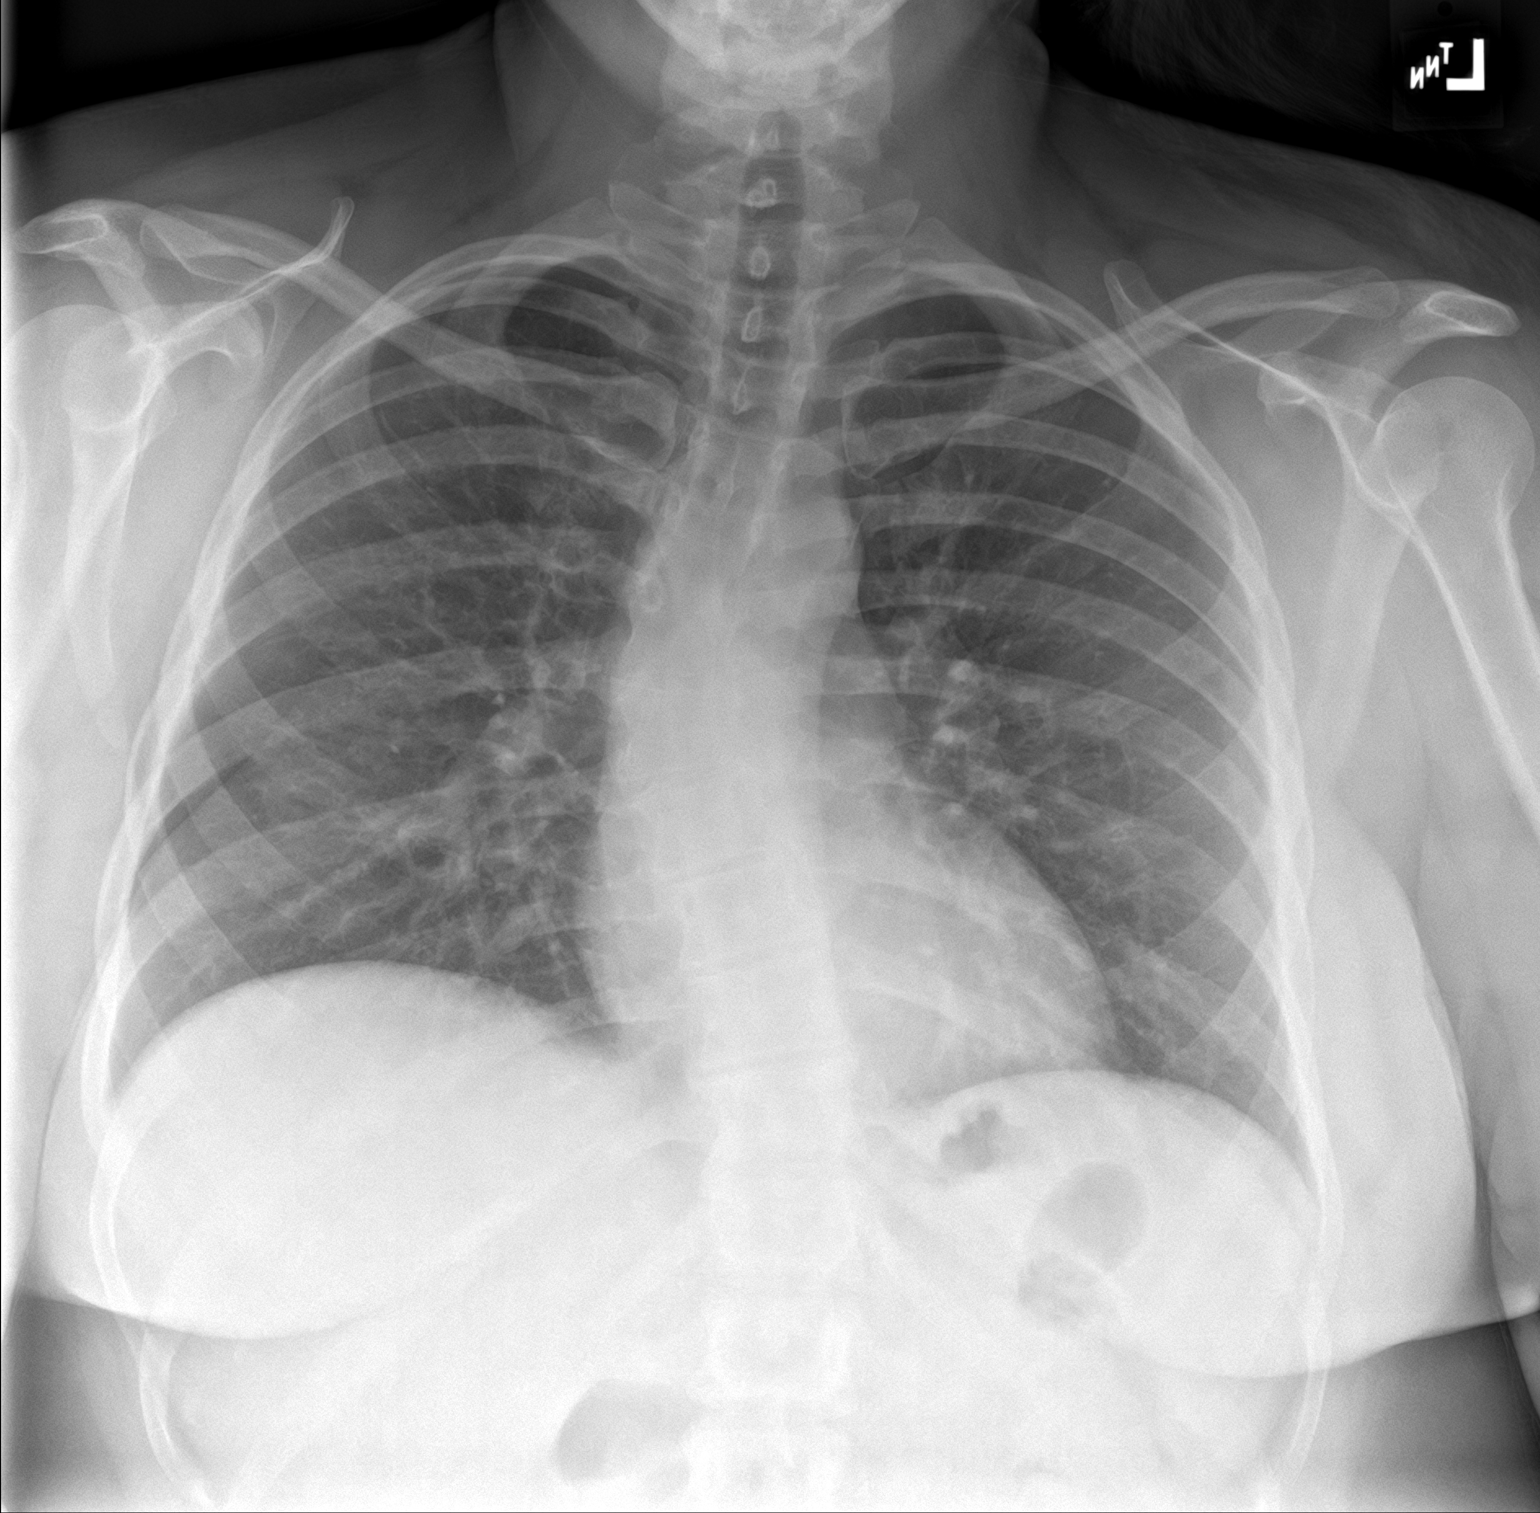

[chest lat]
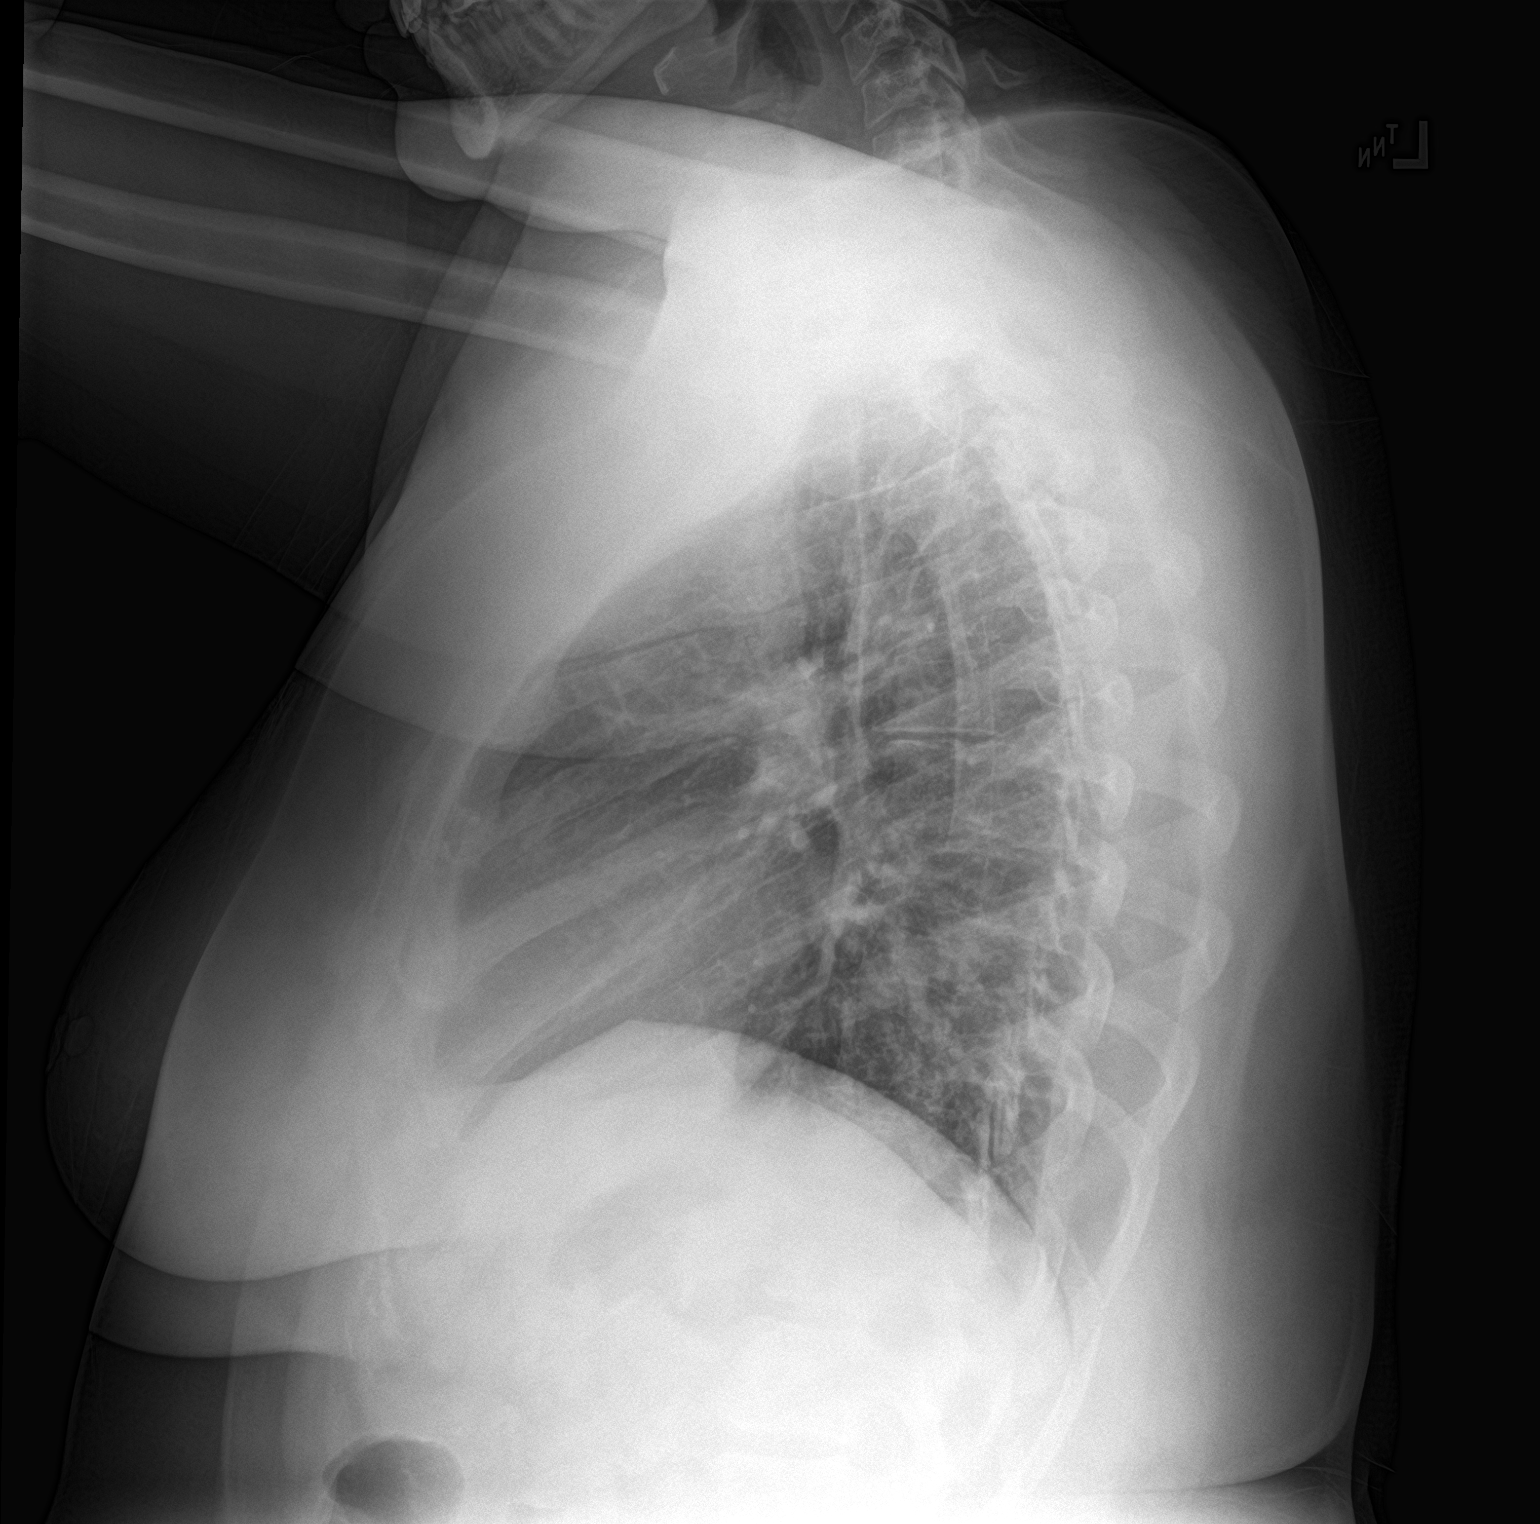

[2 of 2 positions shown; findings below may reference images not displayed]

FINDINGS: Mediastinum and hilar structures normal. Heart size normal. No focal
infiltrate. No pleural effusion or pneumothorax. Thoracic spine
scoliosis and degenerative change.
IMPRESSION: No acute cardiopulmonary disease.

## 2020-09-27 ENCOUNTER — Other Ambulatory Visit: Payer: Self-pay

## 2020-09-27 MED ORDER — AMPHETAMINE-DEXTROAMPHET ER 20 MG PO CP24
ORAL_CAPSULE | ORAL | 0 refills | Status: DC
Start: 2020-09-27 — End: 2020-12-14
  Filled 2020-09-27: qty 30, 30d supply, fill #0

## 2020-10-04 NOTE — Progress Notes (Signed)
Cardiology Office Note:   Date:  10/06/2020  NAME:  Theresa Ortiz    MRN: [id number] DOB:  [birth year]   PCP:  Haywood Pao, MD  Cardiologist:  Evalina Field, MD  Electrophysiologist:  None   Referring MD: Haywood Pao, MD   Chief Complaint  Patient presents with   Follow-up   History of Present Illness:   Theresa Ortiz is a 30 y.o. female with a hx of obesity, pacs who presents for follow-up. Started on metoprolol for PACs.  She reports that she is still having episodes of palpitations.  She can have symptoms 3 times per week.  They are associated with stress.  They can last up to 1 minute.  She reports that she is still having difficulties with her supervisor at work.  This creates a lot of stress in her life.  She reports when she is on vacation she has no heart palpitations.  Symptoms have improved on metoprolol succinate 25 mg daily.  She reports that she may have sleep apnea but will not wear a machine.  She reports that she will hold on testing for now.  She is working on diet and exercise.  She is not exercising currently but has plans to start.  She is reducing her caffeine consumption.  Overall she seems to be stable and symptoms are not worsening.  I did encourage her to buy the apple watch.  This will allow her to monitor her heart rhythm at home.   Problem List 1. Obesity -BMI 44 2. Tobacco abuse  -quit 3. PACs -3.9% burden -Echo normal  Past Medical History: Past Medical History:  Diagnosis Date   Acute meniscal tear of knee    left knee   Allergy    Anxiety    Asthma    Atrial fibrillation ([place])    slight   COVID-19 04/2020   Depression    Ovarian cyst    PVC (premature ventricular contraction)    Scoliosis    Shingles    Tachycardia     Past Surgical History: Past Surgical History:  Procedure Laterality Date   KNEE ARTHROSCOPY WITH LATERAL MENISECTOMY Left 09/01/2012   Procedure: LEFT KNEE ARTHROSCOPY WITH LATERAL MENISCECTOMY ;   Surgeon: Tobi Bastos, MD;  Location: [facility];  Service: Orthopedics;  Laterality: Left;   MENISCUS REPAIR Right    RIGHT INDEX  FINGER TENDON REPAIR  NOV 2013    Current Medications: Current Meds  Medication Sig   ADDERALL XR 20 MG 24 hr capsule    albuterol (VENTOLIN HFA) 108 (90 Base) MCG/ACT inhaler INHALE 1 PUFF INTO THE LUNGS EVERY 4 HOURS AS NEEDED   amphetamine-dextroamphetamine (ADDERALL XR) 20 MG 24 hr capsule TAKE 1 CAPSULE BY MOUTH DAILY ON DAYS THAT SHE WORKS   amphetamine-dextroamphetamine (ADDERALL XR) 20 MG 24 hr capsule Take 1 tablet po daily on days that she works (DX: ICD10: F90.0) Oral Once a day 30 days   cyclobenzaprine (FLEXERIL) 5 MG tablet TAKE 1-2 TABLETS BY MOUTH AT BEDTIME AS NEEDED   FLUoxetine (PROZAC) 10 MG capsule TAKE 1 CAPSULE BY MOUTH ONCE A DAY   methocarbamol (ROBAXIN) 500 MG tablet TAKE 1 TABLET BY MOUTH EVERY SIX TO EIGHT HOURS AS NEEDED IN DAYTIME FOR SPASMS/MUSCLE TENSION   mirabegron ER (MYRBETRIQ) 25 MG TB24 tablet 1 daily per Urology   naproxen (NAPROSYN) 500 MG tablet Take 1 tablet (500 mg total) by mouth 2 (two) times daily with a  meal.   omeprazole (PRILOSEC) 40 MG capsule TAKE 1 CAPSULE BY MOUTH 20 -30 MINUTES PRIOR TO BREAKFAST EVERYDAY   [DISCONTINUED] metoprolol succinate (TOPROL-XL) 25 MG 24 hr tablet TAKE 1 TABLET (25 MG TOTAL) BY MOUTH DAILY. TAKE WITH OR IMMEDIATELY FOLLOWING A MEAL.     Allergies:    Raspberry   Social History: Social History   Socioeconomic History   Marital status: Single    Spouse name: Not on file   Number of children: 0   Years of education: Not on file   Highest education level: Not on file  Occupational History   Occupation: xray tech  Tobacco Use   Smoking status: Former    Packs/day: 0.50    Years: 5.00    Pack years: 2.50    Types: Cigarettes    Quit date: 11/2019    Years since quitting: 0.8   Smokeless tobacco: Never   Tobacco comments:       1 PP3D  Vaping Use    Vaping Use: Never used  Substance and Sexual Activity   Alcohol use: Yes    Alcohol/week: 6.0 standard drinks    Types: 6 Standard drinks or equivalent per week    Comment: 2 times week   Drug use: No   Sexual activity: Not Currently  Other Topics Concern   Not on file  Social History Narrative   Not on file   Social Determinants of Health   Financial Resource Strain: Not on file  Food Insecurity: Not on file  Transportation Needs: Not on file  Physical Activity: Not on file  Stress: Not on file  Social Connections: Not on file     Family History: The patient's family history includes Asthma in her sister; Bladder Cancer in her maternal grandfather; Colon cancer in her maternal grandmother; Diabetes in her father; Heart attack in her paternal grandmother; Heart attack (age of onset: 66) in her father; Heart disease (age of onset: 65) in her paternal grandmother; Hyperlipidemia in her maternal grandfather, maternal grandmother, mother, and paternal grandmother; Kidney Stones in her sister; Prostate cancer in her father; Thyroid cancer in her paternal grandfather.  ROS:   All other ROS reviewed and negative. Pertinent positives noted in the HPI.     EKGs/Labs/Other Studies Reviewed:   The following studies were personally reviewed by me today:  TTE 02/25/2019  1. Left ventricular ejection fraction, by visual estimation, is 60 to  65%. The left ventricle has normal function. There is no left ventricular  hypertrophy.   2. Global right ventricle has normal systolic function.The right  ventricular size is normal. No increase in right ventricular wall  thickness.   3. Left atrial size was normal.   4. Right atrial size was normal.   5. The mitral valve is normal in structure. Trace mitral valve  regurgitation. No evidence of mitral stenosis.   6. The tricuspid valve is normal in structure. Tricuspid valve  regurgitation is trivial.   7. The aortic valve is normal in structure.  Aortic valve regurgitation is  not visualized.   8. The pulmonic valve was normal in structure. Pulmonic valve  regurgitation is not visualized.   9. The atrial septum is grossly normal.   Recent Labs: 07/17/2020: TSH 1.420   Recent Lipid Panel No results found for: CHOL, TRIG, HDL, CHOLHDL, VLDL, LDLCALC, LDLDIRECT  Physical Exam:   VS:  BP 122/76   Pulse 86   Ht 5\' 5"  (1.651 m)   Wt 258 lb  9.6 oz (117.3 kg)   SpO2 97%   BMI 43.03 kg/m    Wt Readings from Last 3 Encounters:  10/06/20 258 lb 9.6 oz (117.3 kg)  07/17/20 257 lb (116.6 kg)  06/06/20 254 lb (115.2 kg)    General: Well nourished, well developed, in no acute distress Head: Atraumatic, normal size  Eyes: PEERLA, EOMI  Neck: Supple, no JVD Endocrine: No thryomegaly Cardiac: Normal S1, S2; RRR; no murmurs, rubs, or gallops Lungs: Clear to auscultation bilaterally, no wheezing, rhonchi or rales  Abd: Soft, nontender, no hepatomegaly  Ext: No edema, pulses 2+ Musculoskeletal: No deformities, BUE and BLE strength normal and equal Skin: Warm and dry, no rashes   Neuro: Alert and oriented to person, place, time, and situation, CNII-XII grossly intact, no focal deficits  Psych: Normal mood and affect   ASSESSMENT:   Theresa Ortiz is a 30 y.o. female who presents for the following: 1. PAC (premature atrial contraction)   2. Palpitations   3. Obesity, morbid, BMI 40.0-49.9 ([place])   4. Tobacco abuse     PLAN:   1. PAC (premature atrial contraction) 2. Palpitations -3.9% PAC burden.  Symptoms have improved on metoprolol succinate 25 mg daily.  She is having symptoms roughly 3 times per week.  They are more often associated with stress.  When she is not at work she has no symptoms.  Given her stability I would recommend to continue her current dose.  She should diet and exercise.  She should also continue with her caffeine reduction strategy.  This seems to be working.  Her echocardiogram was normal.  She overall is  doing well.  3. Obesity, morbid, BMI 40.0-49.9 ([place]) -Diet and exercise recommended.  4. Tobacco abuse -She is no longer smoking.  Disposition: Return in about 6 months (around 04/07/2021).  Medication Adjustments/Labs and Tests Ordered: Current medicines are reviewed at length with the patient today.  Concerns regarding medicines are outlined above.  No orders of the defined types were placed in this encounter.  Meds ordered this encounter  Medications   metoprolol succinate (TOPROL-XL) 25 MG 24 hr tablet    Sig: TAKE 1 TABLET (25 MG TOTAL) BY MOUTH DAILY. TAKE WITH OR IMMEDIATELY FOLLOWING A MEAL.    Dispense:  90 tablet    Refill:  3     Patient Instructions  Medication Instructions:  The current medical regimen is effective;  continue present plan and medications.  *If you need a refill on your cardiac medications before your next appointment, please call your pharmacy*   Follow-Up: At [facility], you and your health needs are our priority.  As part of our continuing mission to provide you with exceptional heart care, we have created designated Provider Care Teams.  These Care Teams include your primary Cardiologist (physician) and Advanced Practice Providers (APPs -  Physician Assistants and Nurse Practitioners) who all work together to provide you with the care you need, when you need it.  We recommend signing up for the patient portal called "MyChart".  Sign up information is provided on this After Visit Summary.  MyChart is used to connect with patients for Virtual Visits (Telemedicine).  Patients are able to view lab/test results, encounter notes, upcoming appointments, etc.  Non-urgent messages can be sent to your provider as well.   To learn more about what you can do with MyChart, go to [email or web address].    Your next appointment:   6 month(s)  The format for your next appointment:  In Person  Provider:   Eleonore Chiquito, MD   Other  Instructions Think about upgrading apple watch.   Time Spent with Patient: I have spent a total of 35 minutes with patient reviewing hospital notes, telemetry, EKGs, labs and examining the patient as well as establishing an assessment and plan that was discussed with the patient.  > 50% of time was spent in direct patient care.  Signed, Addison Naegeli. Audie Box, MD, [place]  [address] [place], [place] [address] [phone]  10/06/2020 9:40 AM

## 2020-10-06 ENCOUNTER — Other Ambulatory Visit: Payer: Self-pay

## 2020-10-06 ENCOUNTER — Encounter: Payer: Self-pay | Admitting: Cardiovascular Disease

## 2020-10-06 ENCOUNTER — Ambulatory Visit: Payer: No Typology Code available for payment source | Admitting: Cardiovascular Disease

## 2020-10-06 ENCOUNTER — Ambulatory Visit (INDEPENDENT_AMBULATORY_CARE_PROVIDER_SITE_OTHER): Payer: No Typology Code available for payment source | Admitting: Cardiovascular Disease

## 2020-10-06 VITALS — BP 122/76 | HR 86 | Ht 65.0 in | Wt 258.6 lb

## 2020-10-06 DIAGNOSIS — I491 Atrial premature depolarization: Secondary | ICD-10-CM

## 2020-10-06 DIAGNOSIS — Z720 Tobacco use: Secondary | ICD-10-CM | POA: Diagnosis not present

## 2020-10-06 DIAGNOSIS — R002 Palpitations: Secondary | ICD-10-CM | POA: Diagnosis not present

## 2020-10-06 MED ORDER — METOPROLOL SUCCINATE ER 25 MG PO TB24
ORAL_TABLET | ORAL | 3 refills | Status: DC
  Filled 2020-10-06 – 2020-10-25 (×2): qty 90, 90d supply, fill #0

## 2020-10-06 MED ORDER — METOPROLOL SUCCINATE ER 25 MG PO TB24: ORAL_TABLET | ORAL | 3 refills | Status: DC

## 2020-10-06 NOTE — Patient Instructions (Signed)
Medication Instructions:  The current medical regimen is effective;  continue present plan and medications.  *If you need a refill on your cardiac medications before your next appointment, please call your pharmacy*   Follow-Up: At [facility], you and your health needs are our priority.  As part of our continuing mission to provide you with exceptional heart care, we have created designated Provider Care Teams.  These Care Teams include your primary Cardiologist (physician) and Advanced Practice Providers (APPs -  Physician Assistants and Nurse Practitioners) who all work together to provide you with the care you need, when you need it.  We recommend signing up for the patient portal called "MyChart".  Sign up information is provided on this After Visit Summary.  MyChart is used to connect with patients for Virtual Visits (Telemedicine).  Patients are able to view lab/test results, encounter notes, upcoming appointments, etc.  Non-urgent messages can be sent to your provider as well.   To learn more about what you can do with MyChart, go to [email or web address].    Your next appointment:   6 month(s)  The format for your next appointment:   In Person  Provider:   Eleonore Chiquito, MD   Other Instructions Think about upgrading apple watch.

## 2020-10-12 ENCOUNTER — Other Ambulatory Visit: Payer: Self-pay

## 2020-10-24 ENCOUNTER — Other Ambulatory Visit: Payer: Self-pay

## 2020-10-25 ENCOUNTER — Other Ambulatory Visit: Payer: Self-pay

## 2020-11-16 ENCOUNTER — Other Ambulatory Visit: Payer: Self-pay

## 2020-11-16 MED FILL — Omeprazole Cap Delayed Release 40 MG: ORAL | 90 days supply | Qty: 90 | Fill #0 | Status: AC

## 2020-12-14 ENCOUNTER — Other Ambulatory Visit: Payer: Self-pay

## 2020-12-14 MED ORDER — AMPHETAMINE-DEXTROAMPHET ER 20 MG PO CP24
ORAL_CAPSULE | ORAL | 0 refills | Status: DC
  Filled 2020-12-14: qty 30, 30d supply, fill #0

## 2021-02-28 ENCOUNTER — Other Ambulatory Visit: Payer: Self-pay

## 2021-02-28 MED ORDER — AMPHETAMINE-DEXTROAMPHET ER 20 MG PO CP24
ORAL_CAPSULE | ORAL | 0 refills | Status: DC
  Filled 2021-02-28: qty 30, 30d supply, fill #0

## 2021-04-17 ENCOUNTER — Encounter: Payer: Self-pay | Admitting: Cardiovascular Disease

## 2021-04-26 NOTE — Progress Notes (Signed)
Cardiology Office Note:   Date:  04/27/2021  NAME:  Theresa Ortiz    MRN: [id number] DOB:  Nov 10, 1990   PCP:  Haywood Pao, MD  Cardiologist:  Evalina Field, MD  Electrophysiologist:  None   Referring MD: Haywood Pao, MD   Chief Complaint  Patient presents with   Follow-up         History of Present Illness:   Theresa Ortiz is a 30 y.o. female with a hx of PACs, obesity who presents for follow-up.  She presents for routine follow-up.  Seems to be doing well.  Did have some episodes of chest pain over the Christmas holiday.  This was stress-induced.  Described as not exertional chest pressure that lasted over 1 hour.  She is also reporting palpitations.  Can occur daily.  She is not taking her metoprolol regularly.  When she does take her metoprolol symptoms improved.  BP stable today.  Pulse stable today.  Exam unremarkable.  She is working on reducing her caffeine consumption.  Overall understands her triggers.  We discussed getting back on metoprolol regularly which she will do.  Problem List 1. Obesity -BMI 44 2. Tobacco abuse  -quit 3. PACs -3.9% burden -Echo normal  Past Medical History: Past Medical History:  Diagnosis Date   Acute meniscal tear of knee    left knee   Allergy    Anxiety    Asthma    Atrial fibrillation ([place])    slight   COVID-19 04/2020   Depression    Ovarian cyst    PVC (premature ventricular contraction)    Scoliosis    Shingles    Tachycardia     Past Surgical History: Past Surgical History:  Procedure Laterality Date   KNEE ARTHROSCOPY WITH LATERAL MENISECTOMY Left 09/01/2012   Procedure: LEFT KNEE ARTHROSCOPY WITH LATERAL MENISCECTOMY ;  Surgeon: Tobi Bastos, MD;  Location: [facility];  Service: Orthopedics;  Laterality: Left;   MENISCUS REPAIR Right    RIGHT INDEX  FINGER TENDON REPAIR  NOV 2013    Current Medications: Current Meds  Medication Sig   albuterol (VENTOLIN HFA) 108 (90  Base) MCG/ACT inhaler INHALE 1 PUFF INTO THE LUNGS EVERY 4 HOURS AS NEEDED   naproxen (NAPROSYN) 500 MG tablet Take 1 tablet (500 mg total) by mouth 2 (two) times daily with a meal.   [DISCONTINUED] amphetamine-dextroamphetamine (ADDERALL XR) 20 MG 24 hr capsule Take 1 tablet po daily on days that she works (DX: ICD10: F90.0) Oral Once a day 30 days   [DISCONTINUED] metoprolol succinate (TOPROL-XL) 25 MG 24 hr tablet TAKE 1 TABLET (25 MG TOTAL) BY MOUTH DAILY. TAKE WITH OR IMMEDIATELY FOLLOWING A MEAL.     Allergies:    Raspberry   Social History: Social History   Socioeconomic History   Marital status: Single    Spouse name: Not on file   Number of children: 0   Years of education: Not on file   Highest education level: Not on file  Occupational History   Occupation: xray tech  Tobacco Use   Smoking status: Former    Packs/day: 0.50    Years: 5.00    Pack years: 2.50    Types: Cigarettes    Quit date: 11/2019    Years since quitting: 1.4   Smokeless tobacco: Never   Tobacco comments:       1 PP3D  Vaping Use   Vaping Use: Never used  Substance and  Sexual Activity   Alcohol use: Yes    Alcohol/week: 6.0 standard drinks    Types: 6 Standard drinks or equivalent per week    Comment: 2 times week   Drug use: No   Sexual activity: Not Currently  Other Topics Concern   Not on file  Social History Narrative   Not on file   Social Determinants of Health   Financial Resource Strain: Not on file  Food Insecurity: Not on file  Transportation Needs: Not on file  Physical Activity: Not on file  Stress: Not on file  Social Connections: Not on file     Family History: The patient's family history includes Asthma in her sister; Bladder Cancer in her maternal grandfather; Colon cancer in her maternal grandmother; Diabetes in her father; Heart attack in her paternal grandmother; Heart attack (age of onset: 46) in her father; Heart disease (age of onset: 49) in her paternal  grandmother; Hyperlipidemia in her maternal grandfather, maternal grandmother, mother, and paternal grandmother; Kidney Stones in her sister; Prostate cancer in her father; Thyroid cancer in her paternal grandfather.  ROS:   All other ROS reviewed and negative. Pertinent positives noted in the HPI.     EKGs/Labs/Other Studies Reviewed:   The following studies were personally reviewed by me today:  Recent Labs: 07/17/2020: TSH 1.420   Recent Lipid Panel No results found for: CHOL, TRIG, HDL, CHOLHDL, VLDL, LDLCALC, LDLDIRECT  Physical Exam:   VS:  BP 102/72 (BP Location: Left Arm, Patient Position: Sitting, Cuff Size: Normal)    Pulse 92    Resp 20    Ht 5\' 5"  (1.651 m)    Wt 265 lb 6.4 oz (120.4 kg)    SpO2 98%    BMI 44.16 kg/m    Wt Readings from Last 3 Encounters:  04/27/21 265 lb 6.4 oz (120.4 kg)  10/06/20 258 lb 9.6 oz (117.3 kg)  07/17/20 257 lb (116.6 kg)    General: Well nourished, well developed, in no acute distress Head: Atraumatic, normal size  Eyes: PEERLA, EOMI  Neck: Supple, no JVD Endocrine: No thryomegaly Cardiac: Normal S1, S2; RRR; no murmurs, rubs, or gallops Lungs: Clear to auscultation bilaterally, no wheezing, rhonchi or rales  Abd: Soft, nontender, no hepatomegaly  Ext: No edema, pulses 2+ Musculoskeletal: No deformities, BUE and BLE strength normal and equal Skin: Warm and dry, no rashes   Neuro: Alert and oriented to person, place, time, and situation, CNII-XII grossly intact, no focal deficits  Psych: Normal mood and affect   ASSESSMENT:   Theresa Ortiz is a 30 y.o. female who presents for the following: 1. PAC (premature atrial contraction)   2. Palpitations   3. Obesity, morbid, BMI 40.0-49.9 ([place])     PLAN:   1. PAC (premature atrial contraction) 2. Palpitations 3. Obesity, morbid, BMI 40.0-49.9 (H[facility]) -History of palpitations and PACs.  3.9% burden.  Symptoms had improved on metoprolol succinate 25 mg daily.  She unfortunately does not  take this regularly.  I encouraged her to do so.  I also recommended regular exercise and hydration.  She needs to lose weight.  No concerns for sleep apnea per her report.  All of her work-up including echo and stress test have been normal.  She continues to have noncardiac chest pain.  I provided reassurance in office today.  She will see [place] back in 1 year.  She will let us know if symptoms change.    Disposition: Return in about 1 year (  around 04/27/2022).  Medication Adjustments/Labs and Tests Ordered: Current medicines are reviewed at length with the patient today.  Concerns regarding medicines are outlined above.  No orders of the defined types were placed in this encounter.  Meds ordered this encounter  Medications   metoprolol succinate (TOPROL-XL) 25 MG 24 hr tablet    Sig: TAKE 1 TABLET (25 MG TOTAL) BY MOUTH DAILY. TAKE WITH OR IMMEDIATELY FOLLOWING A MEAL.    Dispense:  90 tablet    Refill:  3    Patient Instructions  Medication Instructions:  The current medical regimen is effective;  continue present plan and medications.  *If you need a refill on your cardiac medications before your next appointment, please call your pharmacy*  Follow-Up: At [facility], you and your health needs are our priority.  As part of our continuing mission to provide you with exceptional heart care, we have created designated Provider Care Teams.  These Care Teams include your primary Cardiologist (physician) and Advanced Practice Providers (APPs -  Physician Assistants and Nurse Practitioners) who all work together to provide you with the care you need, when you need it.  We recommend signing up for the patient portal called "MyChart".  Sign up information is provided on this After Visit Summary.  MyChart is used to connect with patients for Virtual Visits (Telemedicine).  Patients are able to view lab/test results, encounter notes, upcoming appointments, etc.  Non-urgent messages can be sent to  your provider as well.   To learn more about what you can do with MyChart, go to [email or web address].    Your next appointment:   12 month(s)  The format for your next appointment:   In Person  Provider:   Evalina Field, MD        Time Spent with Patient: I have spent a total of 35 minutes with patient reviewing hospital notes, telemetry, EKGs, labs and examining the patient as well as establishing an assessment and plan that was discussed with the patient.  > 50% of time was spent in direct patient care.  Signed, Addison Naegeli. Audie Box, MD, [place]  [address] [place], [place] [address] [phone]  04/27/2021 10:58 AM

## 2021-04-27 ENCOUNTER — Ambulatory Visit (INDEPENDENT_AMBULATORY_CARE_PROVIDER_SITE_OTHER): Payer: No Typology Code available for payment source | Admitting: Cardiovascular Disease

## 2021-04-27 ENCOUNTER — Other Ambulatory Visit: Payer: Self-pay

## 2021-04-27 ENCOUNTER — Ambulatory Visit: Payer: No Typology Code available for payment source | Admitting: Cardiovascular Disease

## 2021-04-27 ENCOUNTER — Encounter: Payer: Self-pay | Admitting: Cardiovascular Disease

## 2021-04-27 VITALS — BP 102/72 | HR 92 | Resp 20 | Ht 65.0 in | Wt 265.4 lb

## 2021-04-27 DIAGNOSIS — I491 Atrial premature depolarization: Secondary | ICD-10-CM | POA: Diagnosis not present

## 2021-04-27 DIAGNOSIS — R002 Palpitations: Secondary | ICD-10-CM | POA: Diagnosis not present

## 2021-04-27 MED ORDER — METOPROLOL SUCCINATE ER 25 MG PO TB24: ORAL_TABLET | ORAL | 3 refills | Status: DC

## 2021-04-27 NOTE — Patient Instructions (Signed)
Medication Instructions:  °The current medical regimen is effective;  continue present plan and medications. ° °*If you need a refill on your cardiac medications before your next appointment, please call your pharmacy* ° ° °Follow-Up: °At [facility], you and your health needs are our priority.  As part of our continuing mission to provide you with exceptional heart care, we have created designated Provider Care Teams.  These Care Teams include your primary Cardiologist (physician) and Advanced Practice Providers (APPs -  Physician Assistants and Nurse Practitioners) who all work together to provide you with the care you need, when you need it. ° °We recommend signing up for the patient portal called "MyChart".  Sign up information is provided on this After Visit Summary.  MyChart is used to connect with patients for Virtual Visits (Telemedicine).  Patients are able to view lab/test results, encounter notes, upcoming appointments, etc.  Non-urgent messages can be sent to your provider as well.   °To learn more about what you can do with MyChart, go to [email or web address].   ° °Your next appointment:   °12 month(s) ° °The format for your next appointment:   °In Person ° °Provider:   °Hornbrook T O'Neal, MD   ° ° ° °

## 2021-05-09 ENCOUNTER — Other Ambulatory Visit: Payer: Self-pay

## 2021-05-09 MED ORDER — AMPHETAMINE-DEXTROAMPHET ER 20 MG PO CP24
ORAL_CAPSULE | ORAL | 0 refills | Status: DC
  Filled 2021-05-09: qty 30, 30d supply, fill #0

## 2021-06-15 ENCOUNTER — Other Ambulatory Visit: Payer: Self-pay

## 2021-06-15 MED ORDER — OMEPRAZOLE 40 MG PO CPDR
DELAYED_RELEASE_CAPSULE | ORAL | 3 refills | Status: DC
  Filled 2021-06-15: qty 90, 90d supply, fill #0

## 2021-06-15 MED ORDER — FLUOXETINE HCL 10 MG PO CAPS
ORAL_CAPSULE | ORAL | 0 refills | Status: DC
  Filled 2021-06-15: qty 90, 90d supply, fill #0

## 2021-06-15 MED ORDER — AMPHETAMINE-DEXTROAMPHET ER 20 MG PO CP24
ORAL_CAPSULE | ORAL | 0 refills | Status: DC
  Filled 2021-06-15: qty 30, 30d supply, fill #0

## 2021-06-18 ENCOUNTER — Other Ambulatory Visit: Payer: Self-pay

## 2021-06-19 ENCOUNTER — Other Ambulatory Visit: Payer: Self-pay

## 2021-06-19 MED ORDER — OMEPRAZOLE 40 MG PO CPDR
DELAYED_RELEASE_CAPSULE | ORAL | 3 refills | Status: DC
  Filled 2021-06-19 (×3): qty 90, 90d supply, fill #0
  Filled 2021-09-20 – 2021-10-08 (×2): qty 90, 90d supply, fill #1
  Filled 2022-05-01: qty 90, 90d supply, fill #2

## 2021-06-19 MED ORDER — PREDNISONE 20 MG PO TABS
ORAL_TABLET | ORAL | 0 refills | Status: DC
  Filled 2021-06-19 (×2): qty 12, 6d supply, fill #0

## 2021-06-19 MED ORDER — METOPROLOL SUCCINATE ER 25 MG PO TB24
ORAL_TABLET | ORAL | 2 refills | Status: DC
  Filled 2021-06-19: qty 90, 90d supply, fill #0
  Filled 2021-12-27: qty 90, 90d supply, fill #1

## 2021-06-20 ENCOUNTER — Other Ambulatory Visit: Payer: Self-pay

## 2021-06-21 ENCOUNTER — Other Ambulatory Visit: Payer: Self-pay

## 2021-06-21 ENCOUNTER — Other Ambulatory Visit: Payer: Self-pay | Admitting: Neurosurgery

## 2021-06-21 ENCOUNTER — Ambulatory Visit
Admission: RE | Admit: 2021-06-21 | Discharge: 2021-06-21 | Disposition: A | Discharge status: Home / Self Care | Payer: PRIVATE HEALTH INSURANCE | Source: Ambulatory Visit | Attending: Neurosurgery | Admitting: Neurosurgery

## 2021-06-21 DIAGNOSIS — G834 Cauda equina syndrome: Secondary | ICD-10-CM | POA: Insufficient documentation

## 2021-06-22 ENCOUNTER — Emergency Department: Payer: PRIVATE HEALTH INSURANCE

## 2021-06-22 ENCOUNTER — Emergency Department
Admission: EM | Admit: 2021-06-22 | Discharge: 2021-06-22 | Disposition: A | Discharge status: Home / Self Care | Payer: PRIVATE HEALTH INSURANCE | Attending: Emergency Medicine | Admitting: Emergency Medicine

## 2021-06-22 ENCOUNTER — Other Ambulatory Visit: Payer: Self-pay

## 2021-06-22 DIAGNOSIS — R202 Paresthesia of skin: Secondary | ICD-10-CM | POA: Insufficient documentation

## 2021-06-22 DIAGNOSIS — M546 Pain in thoracic spine: Secondary | ICD-10-CM | POA: Diagnosis not present

## 2021-06-22 DIAGNOSIS — R200 Anesthesia of skin: Secondary | ICD-10-CM | POA: Diagnosis not present

## 2021-06-22 DIAGNOSIS — X500XXA Overexertion from strenuous movement or load, initial encounter: Secondary | ICD-10-CM | POA: Insufficient documentation

## 2021-06-22 DIAGNOSIS — M549 Dorsalgia, unspecified: Secondary | ICD-10-CM | POA: Diagnosis present

## 2021-06-22 DIAGNOSIS — R102 Pelvic and perineal pain: Secondary | ICD-10-CM | POA: Insufficient documentation

## 2021-06-22 DIAGNOSIS — G8911 Acute pain due to trauma: Secondary | ICD-10-CM | POA: Insufficient documentation

## 2021-06-22 LAB — URINALYSIS, ROUTINE W REFLEX MICROSCOPIC
Bilirubin Urine: NEGATIVE
Glucose, UA: NEGATIVE mg/dL
Hgb urine dipstick: NEGATIVE
Ketones, ur: NEGATIVE mg/dL
Leukocytes,Ua: NEGATIVE
Nitrite: NEGATIVE
Protein, ur: NEGATIVE mg/dL
Specific Gravity, Urine: 1.015 (ref 1.005–1.030)
pH: 8 (ref 5.0–8.0)

## 2021-06-22 MED ORDER — LIDOCAINE 5 % EX PTCH: 1.0000 | MEDICATED_PATCH | Freq: Two times a day (BID) | CUTANEOUS | 0 refills | Status: DC

## 2021-06-22 MED ORDER — LIDOCAINE 5 % EX PTCH
1.0000 | MEDICATED_PATCH | Freq: Two times a day (BID) | CUTANEOUS | 0 refills | Status: DC
  Filled 2021-06-22: qty 10, 10d supply, fill #0

## 2021-06-22 NOTE — ED Notes (Signed)
Pt signed paper copy of discharge papers

## 2021-06-22 NOTE — ED Provider Notes (Signed)
[facility] Provider Note    Event Date/Time   First MD Initiated Contact with Patient 06/22/21 2012     (approximate)   History   Back Pain   HPI  Theresa Ortiz is a 31 y.o. female who presents to the ED for evaluation of Back Pain   Review neurosurgery consultation with Dr. Izora Ribas that occurred yesterday as well as the MRI that was obtained yesterday for lower back. She was evaluated for numbness to her legs, tingling to her pelvis around her vagina and shocklike pain from her back going down her left side. MRI of her lumbar back with similar L4-L5 degenerative changes with mild left foraminal stenosis.  Patient presents to the ED for evaluation of continued back pain and urinary sensation changes.  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  She reports concurrent alterations to her sensation of when she feels the urge to void.  Instead of feeling her typical sensation of when she needs to go, she reports feeling nothing, until a sudden sensation of the need to avoid, almost causing incontinence.  Denies retention, dysuria or hematuria.  Denies additional falls or injuries.  Reports that she is especially concerned about her thoracic back as the lumbar MRI was the only one performed yesterday.   Physical Exam   Triage Vital Signs: ED Triage Vitals  Enc Vitals Group     BP 06/22/21 1942 124/86     Pulse Rate 06/22/21 1941 (!) 121     Resp 06/22/21 1941 16     Temp 06/22/21 1942 98.3 F (36.8 C)     Temp Source 06/22/21 1941 Oral     SpO2 06/22/21 1941 100 %     Weight 06/22/21 1941 250 lb (113.4 kg)     Height 06/22/21 1941 5\' 4"  (1.626 m)     Head Circumference --      Peak Flow --      Pain Score 06/22/21 1941 8     Pain Loc --      Pain Edu? --      Excl. in [place]? --     Most recent vital signs: Vitals:   06/22/21 1942 06/22/21 2300  BP:  124/86 106/75  Pulse:  82  Resp:  16  Temp: 98.3 F (36.8 C)   SpO2:  99%    General: Awake, no distress.  Morbidly obese.  Ambulatory independently with a normal gait. CV:  Good peripheral perfusion.  Resp:  Normal effort.  Abd:  No distention.  MSK:  No deformity noted.  Neuro:  No focal deficits appreciated. Cranial nerves II through XII intact 5/5 strength and sensation in all 4 extremities Other:     ED Results / Procedures / Treatments   Labs (all labs ordered are listed, but only abnormal results are displayed) Labs Reviewed  URINALYSIS, ROUTINE W REFLEX MICROSCOPIC - Abnormal; Notable for the following components:      Result Value   Color, Urine STRAW (*)    APPearance CLEAR (*)    All other components within normal limits    EKG   RADIOLOGY   Official radiology report(s): MR THORACIC SPINE WO CONTRAST  Result Date: 06/22/2021 CLINICAL DATA:  Lifting injury, left leg weakness, concerning for cord compression EXAM: MRI THORACIC SPINE WITHOUT CONTRAST TECHNIQUE: Multiplanar, multisequence MR imaging of the thoracic spine was performed. No intravenous  contrast was administered. COMPARISON:  10/19/2019 FINDINGS: Alignment: S shaped curvature of the thoracolumbar spine, unchanged. No listhesis. Vertebrae: No acute fracture, evidence of discitis, or bone lesion. Unchanged mild wedging of T11. Cord:  Normal signal and morphology. Paraspinal and other soft tissues: Negative. Disc levels: Mild disc degenerative changes, with minimal disc bulge at T8-T9. No spinal canal stenosis. Facet arthropathy causes moderate left neural foraminal narrowing at T8-T9, which appears unchanged. IMPRESSION: 1. No acute fracture or traumatic listhesis in the thoracic spine. No new disc bulge. 2. Unchanged minimal degenerative changes, with moderate left neural foraminal narrowing at T8-T9. No spinal canal stenosis or new neural foraminal narrowing. Electronically Signed   By: Merilyn Baba M.D.    On: 06/22/2021 23:02    PROCEDURES and INTERVENTIONS:  Procedures  Medications - No data to display   IMPRESSION / MDM / [place] / ED COURSE  I reviewed the triage vital signs and the nursing notes.  Obese 31 year old woman presents to the ED with urinary changes in the setting of acute on chronic back pain, without evidence of cauda equina, cord compression or further acute pathology, suitable for outpatient management with follow-up with PT and neurosurgery.  She looks clinically well, is ambulatory without evidence of neurologic or vascular deficits.  Symptoms are not clearly consistent with cauda equina.  She has no urinary retention, and her sensation changes are nonspecific.  Urine without infectious features to suggest acute cystitis.  I reviewed her MRI of her lumbar spine from yesterday without evidence of cord compression.  She has significant anxiety regarding her thoracic spine, so we decided to perform MRI of her T-spine, which demonstrates no concerning features, cord compression.  We discussed nonnarcotic multimodal analgesia, following up with PT and return precautions for the ED.  Clinical Course as of 06/22/21 2317  Fri Jun 22, 2021  2025 Discussed plan of care with the patient.  She reports significant anxiety concerned about her thoracic back.  Shared decision-making yields plan to pursue urinalysis and MRI of her thoracic back.  She is agreeable. [DS]  2310 Reassessed.  Tachycardia has resolved without significant intervention.  We discussed reassuring MRI of her T-spine and she is quite reassured.  We discussed return precautions for the ED, following up with physical therapy and neurosurgery. [DS]    Clinical Course User Index [DS] Vladimir Crofts, MD     FINAL CLINICAL IMPRESSION(S) / ED DIAGNOSES   Final diagnoses:  Acute bilateral thoracic back pain     Rx / DC Orders   ED Discharge Orders          Ordered    lidocaine (LIDODERM) 5 %  Every 12  hours        06/22/21 2315             Note:  This document was prepared using Dragon voice recognition software and may include unintentional dictation errors.   Vladimir Crofts, MD 06/22/21 [phone]

## 2021-06-22 NOTE — ED Notes (Signed)
Patient transported to MRI 

## 2021-06-22 NOTE — ED Triage Notes (Addendum)
Pt states is having lower and upper back pain, left leg weakness, tingling numbness in back of legs, shoulder blades and vagina pt states she has not had normal urination. Pt states she had an MRI yesterday for back pain. Pt states she is having decreased urinary sensitivity. Pt states saw dr. Daun Peacock yesterday who was concerned for cauda equina.

## 2021-06-22 NOTE — Discharge Instructions (Addendum)
Please take Tylenol and ibuprofen/Advil for your pain.  It is safe to take them together, or to alternate them every few hours.  Take up to 1000mg  of Tylenol at a time, up to 4 times per day.  Do not take more than 4000 mg of Tylenol in 24 hours.  For ibuprofen, take 400-600 mg, 4-5 times per day.  Please use lidocaine patches at your site of pain.  Apply 1 patch at a time, leave on for 12 hours, then remove for 12 hours.  12 hours on, 12 hours off.  Do not apply more than 1 patch at a time.

## 2021-06-25 ENCOUNTER — Other Ambulatory Visit: Payer: Self-pay

## 2021-06-27 ENCOUNTER — Other Ambulatory Visit: Payer: Self-pay

## 2021-06-28 ENCOUNTER — Other Ambulatory Visit: Payer: Self-pay

## 2021-07-17 ENCOUNTER — Other Ambulatory Visit: Payer: Self-pay

## 2021-07-17 ENCOUNTER — Other Ambulatory Visit (HOSPITAL_COMMUNITY): Payer: Self-pay

## 2021-07-17 MED ORDER — AMPHETAMINE-DEXTROAMPHET ER 20 MG PO CP24
ORAL_CAPSULE | ORAL | 0 refills | Status: DC
  Filled 2021-07-17 (×2): qty 30, 30d supply, fill #0

## 2021-07-31 ENCOUNTER — Other Ambulatory Visit: Payer: Self-pay

## 2021-08-01 ENCOUNTER — Other Ambulatory Visit: Payer: Self-pay

## 2021-08-02 ENCOUNTER — Other Ambulatory Visit: Payer: Self-pay

## 2021-08-02 MED ORDER — SEMAGLUTIDE-WEIGHT MANAGEMENT 0.25 MG/0.5ML ~~LOC~~ SOAJ
0.2500 mg | SUBCUTANEOUS | 5 refills | Status: DC
  Filled 2021-08-02 – 2021-08-29 (×5): qty 2, 28d supply, fill #0
  Filled 2021-10-31: qty 2, 28d supply, fill #1

## 2021-08-03 ENCOUNTER — Other Ambulatory Visit: Payer: Self-pay

## 2021-08-06 ENCOUNTER — Other Ambulatory Visit: Payer: Self-pay

## 2021-08-06 MED ORDER — FLUOROURACIL 5 % EX CREA
TOPICAL_CREAM | CUTANEOUS | 0 refills | Status: DC
  Filled 2021-08-06: qty 20, 21d supply, fill #0
  Filled 2021-08-06: qty 40, 21d supply, fill #0

## 2021-08-07 ENCOUNTER — Other Ambulatory Visit: Payer: Self-pay

## 2021-08-10 ENCOUNTER — Other Ambulatory Visit: Payer: Self-pay

## 2021-08-16 ENCOUNTER — Other Ambulatory Visit: Payer: Self-pay

## 2021-08-16 MED ORDER — METOPROLOL SUCCINATE ER 25 MG PO TB24
ORAL_TABLET | ORAL | 2 refills | Status: DC
  Filled 2021-08-16 – 2021-08-30 (×2): qty 90, 90d supply, fill #0
  Filled 2021-12-06 – 2022-01-25 (×2): qty 90, 90d supply, fill #1
  Filled 2022-05-01: qty 90, 90d supply, fill #2

## 2021-08-17 ENCOUNTER — Other Ambulatory Visit: Payer: Self-pay

## 2021-08-29 ENCOUNTER — Other Ambulatory Visit: Payer: Self-pay

## 2021-08-30 ENCOUNTER — Other Ambulatory Visit: Payer: Self-pay

## 2021-08-31 ENCOUNTER — Other Ambulatory Visit: Payer: Self-pay

## 2021-09-10 ENCOUNTER — Other Ambulatory Visit: Payer: Self-pay

## 2021-09-10 MED ORDER — GABAPENTIN 300 MG PO CAPS
ORAL_CAPSULE | ORAL | 2 refills | Status: DC
  Filled 2021-09-10: qty 90, 30d supply, fill #0
  Filled 2021-12-27: qty 90, 30d supply, fill #1

## 2021-09-20 ENCOUNTER — Other Ambulatory Visit: Payer: Self-pay

## 2021-09-21 ENCOUNTER — Other Ambulatory Visit: Payer: Self-pay

## 2021-09-21 MED ORDER — WEGOVY 0.5 MG/0.5ML ~~LOC~~ SOAJ
SUBCUTANEOUS | 0 refills | Status: DC
  Filled 2021-09-21 – 2021-11-23 (×2): qty 2, 28d supply, fill #0

## 2021-09-21 MED ORDER — FLUOXETINE HCL 10 MG PO CAPS
ORAL_CAPSULE | ORAL | 0 refills | Status: DC
  Filled 2021-09-21 – 2021-10-08 (×2): qty 90, 90d supply, fill #0

## 2021-09-21 MED ORDER — AMPHETAMINE-DEXTROAMPHET ER 20 MG PO CP24
ORAL_CAPSULE | ORAL | 0 refills | Status: DC
  Filled 2021-09-21 – 2021-10-08 (×2): qty 30, 30d supply, fill #0

## 2021-09-26 ENCOUNTER — Other Ambulatory Visit: Payer: Self-pay

## 2021-10-04 ENCOUNTER — Other Ambulatory Visit: Payer: Self-pay

## 2021-10-05 ENCOUNTER — Other Ambulatory Visit: Payer: Self-pay

## 2021-10-08 ENCOUNTER — Other Ambulatory Visit: Payer: Self-pay

## 2021-10-31 ENCOUNTER — Other Ambulatory Visit: Payer: Self-pay

## 2021-11-19 ENCOUNTER — Other Ambulatory Visit: Payer: Self-pay | Admitting: Physical Medicine & Rehabilitation

## 2021-11-19 DIAGNOSIS — M5441 Lumbago with sciatica, right side: Secondary | ICD-10-CM

## 2021-11-23 ENCOUNTER — Other Ambulatory Visit: Payer: Self-pay

## 2021-11-23 ENCOUNTER — Ambulatory Visit
Admission: RE | Admit: 2021-11-23 | Discharge: 2021-11-23 | Disposition: A | Discharge status: Home / Self Care | Payer: No Typology Code available for payment source | Source: Ambulatory Visit | Attending: Physical Medicine & Rehabilitation | Admitting: Physical Medicine & Rehabilitation

## 2021-11-23 DIAGNOSIS — M5442 Lumbago with sciatica, left side: Secondary | ICD-10-CM | POA: Diagnosis present

## 2021-11-23 DIAGNOSIS — M5441 Lumbago with sciatica, right side: Secondary | ICD-10-CM | POA: Diagnosis present

## 2021-11-27 ENCOUNTER — Other Ambulatory Visit: Payer: Self-pay

## 2021-12-03 ENCOUNTER — Other Ambulatory Visit: Payer: Self-pay

## 2021-12-03 MED ORDER — GABAPENTIN 300 MG PO CAPS
ORAL_CAPSULE | ORAL | 5 refills | Status: DC
  Filled 2021-12-03: qty 90, 30d supply, fill #0

## 2021-12-06 ENCOUNTER — Other Ambulatory Visit: Payer: Self-pay

## 2021-12-06 MED ORDER — AMPHETAMINE-DEXTROAMPHET ER 20 MG PO CP24
ORAL_CAPSULE | ORAL | 0 refills | Status: DC
  Filled 2021-12-06 – 2022-01-25 (×2): qty 30, 30d supply, fill #0

## 2021-12-06 MED ORDER — FLUOXETINE HCL 10 MG PO CAPS
ORAL_CAPSULE | ORAL | 3 refills | Status: DC
  Filled 2021-12-06 – 2022-01-25 (×2): qty 90, 90d supply, fill #0
  Filled 2022-05-01: qty 90, 90d supply, fill #1
  Filled 2022-11-04: qty 90, 90d supply, fill #2

## 2021-12-20 ENCOUNTER — Other Ambulatory Visit: Payer: Self-pay

## 2021-12-21 ENCOUNTER — Other Ambulatory Visit: Payer: Self-pay

## 2021-12-21 MED ORDER — WEGOVY 1 MG/0.5ML ~~LOC~~ SOAJ
SUBCUTANEOUS | 0 refills | Status: DC
  Filled 2021-12-21: qty 2, 28d supply, fill #0

## 2021-12-21 MED ORDER — OMEPRAZOLE 40 MG PO CPDR
DELAYED_RELEASE_CAPSULE | ORAL | 3 refills | Status: DC
  Filled 2021-12-21: qty 90, 90d supply, fill #0
  Filled 2022-04-03 – 2022-08-13 (×2): qty 90, 90d supply, fill #1

## 2021-12-27 ENCOUNTER — Other Ambulatory Visit: Payer: Self-pay

## 2022-01-02 ENCOUNTER — Other Ambulatory Visit: Payer: Self-pay

## 2022-01-25 ENCOUNTER — Other Ambulatory Visit: Payer: Self-pay

## 2022-01-25 MED ORDER — WEGOVY 1 MG/0.5ML ~~LOC~~ SOAJ
1.0000 mg | SUBCUTANEOUS | 0 refills | Status: DC
  Filled 2022-01-25 – 2022-02-07 (×4): qty 2, 28d supply, fill #0

## 2022-01-29 ENCOUNTER — Other Ambulatory Visit: Payer: Self-pay

## 2022-01-30 ENCOUNTER — Other Ambulatory Visit: Payer: Self-pay

## 2022-02-05 ENCOUNTER — Other Ambulatory Visit: Payer: Self-pay

## 2022-02-07 ENCOUNTER — Other Ambulatory Visit: Payer: Self-pay

## 2022-02-13 ENCOUNTER — Other Ambulatory Visit: Payer: Self-pay

## 2022-03-06 ENCOUNTER — Other Ambulatory Visit: Payer: Self-pay

## 2022-03-06 MED ORDER — WEGOVY 1 MG/0.5ML ~~LOC~~ SOAJ
1.0000 mg | SUBCUTANEOUS | 0 refills | Status: DC
  Filled 2022-03-06: qty 2, 28d supply, fill #0

## 2022-03-06 MED ORDER — AMPHETAMINE-DEXTROAMPHET ER 20 MG PO CP24
ORAL_CAPSULE | ORAL | 0 refills | Status: DC
  Filled 2022-03-06: qty 30, 30d supply, fill #0

## 2022-03-11 ENCOUNTER — Other Ambulatory Visit: Payer: Self-pay

## 2022-03-23 ENCOUNTER — Ambulatory Visit
Admission: EM | Admit: 2022-03-23 | Discharge: 2022-03-23 | Disposition: A | Discharge status: Home / Self Care | Payer: No Typology Code available for payment source

## 2022-03-23 DIAGNOSIS — B9689 Other specified bacterial agents as the cause of diseases classified elsewhere: Secondary | ICD-10-CM | POA: Diagnosis not present

## 2022-03-23 DIAGNOSIS — J019 Acute sinusitis, unspecified: Secondary | ICD-10-CM | POA: Diagnosis not present

## 2022-03-23 MED ORDER — AMOXICILLIN-POT CLAVULANATE 875-125 MG PO TABS: 1.0000 | ORAL_TABLET | Freq: Two times a day (BID) | ORAL | 0 refills | Status: AC

## 2022-03-23 MED ORDER — PREDNISONE 20 MG PO TABS: ORAL_TABLET | ORAL | 0 refills | Status: AC

## 2022-03-23 NOTE — ED Provider Notes (Signed)
[facility]    CSN: [id number] Arrival date & time: 03/23/22  1148      History   Chief Complaint Chief Complaint  Patient presents with   Cough   Nasal Congestion   Facial Pain   Generalized Body Aches   Fatigue    HPI Theresa Ortiz is a 31 y.o. female.    Cough   Presents to urgent care with complaint of productive cough starting 4 weeks ago.  Also endorses body aches, fatigue, nasal drainage, sinus pressure starting 2 weeks ago.  Denies documented fever.  Denies nausea, vomiting, diarrhea.  Past Medical History:  Diagnosis Date   Acute meniscal tear of knee    left knee   Allergy    Anxiety    Asthma    Atrial fibrillation ([place])    slight   COVID-19 04/2020   Depression    Ovarian cyst    PVC (premature ventricular contraction)    Scoliosis    Shingles    Tachycardia     Patient Active Problem List   Diagnosis Date Noted   Palpitation 07/13/2018   Atypical chest pain 07/13/2018   Family history of heart disease 07/13/2018   Shortness of breath 07/13/2018   Sinus tachycardia 07/13/2018   Acute lateral meniscus tear of left knee 09/01/2012    Past Surgical History:  Procedure Laterality Date   KNEE ARTHROSCOPY WITH LATERAL MENISECTOMY Left 09/01/2012   Procedure: LEFT KNEE ARTHROSCOPY WITH LATERAL MENISCECTOMY ;  Surgeon: Tobi Bastos, MD;  Location: [facility];  Service: Orthopedics;  Laterality: Left;   MENISCUS REPAIR Right    RIGHT INDEX  FINGER TENDON REPAIR  NOV 2013    OB History   No obstetric history on file.      Home Medications    Prior to Admission medications   Medication Sig Start Date End Date Taking? Authorizing Provider  lidocaine (LIDODERM) 5 %  06/27/21  Yes [provider]  ADDERALL XR 20 MG 24 hr capsule  05/25/18   [provider]  albuterol (VENTOLIN HFA) 108 (90 Base) MCG/ACT inhaler INHALE 1 PUFF INTO THE LUNGS EVERY 4 HOURS AS NEEDED 06/05/20 06/05/21  Tisovec, Fransico Him, MD  amphetamine-dextroamphetamine (ADDERALL XR) 20 MG 24 hr capsule Take 1 capsule by mouth daily on days that she works 03/06/22     cyclobenzaprine (FLEXERIL) 10 MG tablet TAKE 1/2 TO 1 TABLET BY MOUTH EVERY 8 HOURS AS NEEDED FOR SPASMS    [provider]  fluorouracil (EFUDEX) 5 % cream 1 application Externally Twice a day 21 days 08/06/21     FLUoxetine (PROZAC) 10 MG capsule 1 tablet po daily Oral Once a day 90 days 12/06/21     gabapentin (NEURONTIN) 300 MG capsule Take 1 capsule by mouth at bedtime for 4 days, then increase to 1 cap twice a day for 4 days, then increase to 1 cap three times a day. 09/10/21     gabapentin (NEURONTIN) 300 MG capsule Take one capsule by mouth at bedtime for 4 days, one capsule twice a day for 4 days, then one capsule 3 times a day 12/03/21     meloxicam (MOBIC) 15 MG tablet TAKE 1 TABLET BY MOUTH ONCE A DAY AS NEEDED FOR INFLAMMATION.    [provider]  methocarbamol (ROBAXIN) 500 MG tablet TAKE 1 TABLET BY MOUTH EVERY SIX TO EIGHT HOURS AS NEEDED FOR DAYTIME SPASMS/MUSCLE TENSION    [provider]  metoprolol  succinate (TOPROL-XL) 25 MG 24 hr tablet TAKE 1 TABLET (25 MG TOTAL) BY MOUTH DAILY. TAKE WITH OR IMMEDIATELY FOLLOWING A MEAL. 04/27/21 04/27/22  Geralynn Rile, MD  metoprolol succinate (TOPROL-XL) 25 MG 24 hr tablet 1/2 tablet po every 12 hrs prn palpitations Oral 06/19/21     metoprolol succinate (TOPROL-XL) 25 MG 24 hr tablet 1/2 tablet po every 12 hrs prn palpitations Oral 08/16/21     naproxen (NAPROSYN) 500 MG tablet Take 1 tablet (500 mg total) by mouth 2 (two) times daily with a meal. 04/21/20   Hassell Done, Mary-Margaret, FNP  omeprazole (PRILOSEC) 40 MG capsule TAKE 1 CAPSULE BY MOUTH 20 -30 MINUTES PRIOR TO BREAKFAST EVERYDAY 04/10/20 04/10/21  Milus Banister, MD  omeprazole (PRILOSEC) 40 MG capsule TAKE 1 CAPSULE BY MOUTH EVERY DAY 30 MINUTES BEFORE FIRST MEAL Orally Once a day 90 days 06/15/21     omeprazole  (PRILOSEC) 40 MG capsule TAKE 1 CAPSULE BY MOUTH EVERY DAY 30 MINUTES BEFORE FIRST MEAL Orally Once a day 90 days 06/19/21     omeprazole (PRILOSEC) 40 MG capsule TAKE 1 CAPSULE BY MOUTH EVERY DAY 30 MINUTES BEFORE FIRST MEAL Orally Once a day 90 days 12/21/21     predniSONE (DELTASONE) 20 MG tablet Take 2 tablets ('40mg'$ )  by mouth daily with food 06/19/21   Lily Kocher, PA-C  Semaglutide-Weight Management (WEGOVY) 0.5 MG/0.5ML SOAJ 0.5 mL Subcutaneous once a week 30 days 09/21/21     Semaglutide-Weight Management (WEGOVY) 1 MG/0.5ML SOAJ Inject 1 mg into the skin once a week. 03/06/22     Semaglutide-Weight Management 0.25 MG/0.5ML SOAJ Inject 0.25 mg into the skin once a week. 08/02/21     fluticasone (FLONASE) 50 MCG/ACT nasal spray Place 2 sprays into both nostrils daily. Patient not taking: Reported on 07/13/2018 06/15/18 01/16/19  Tenna Delaine D, PA-C  sodium chloride (OCEAN) 0.65 % SOLN nasal spray Place 1 spray into both nostrils as needed. Patient not taking: Reported on 07/13/2018 06/15/18 01/16/19  Leonie Douglas, PA-C    Family History Family History  Problem Relation Age of Onset   Hyperlipidemia Mother    Diabetes Father    Heart attack Father 86   Prostate cancer Father    Asthma Sister    Kidney Stones Sister    Hyperlipidemia Maternal Grandmother    Colon cancer Maternal Grandmother    Hyperlipidemia Maternal Grandfather    Bladder Cancer Maternal Grandfather    Hyperlipidemia Paternal Grandmother    Heart disease Paternal Grandmother 57   Heart attack Paternal Grandmother    Thyroid cancer Paternal Grandfather     Social History Social History   Tobacco Use   Smoking status: Former    Packs/day: 0.50    Years: 5.00    Total pack years: 2.50    Types: Cigarettes    Quit date: 11/2019    Years since quitting: 2.3   Smokeless tobacco: Never   Tobacco comments:       1 PP3D  Vaping Use   Vaping Use: Never used  Substance Use Topics   Alcohol use: Yes     Alcohol/week: 6.0 standard drinks of alcohol    Types: 6 Standard drinks or equivalent per week    Comment: 2 times week   Drug use: No     Allergies   Raspberry   Review of Systems Review of Systems  Respiratory:  Positive for cough.      Physical Exam Triage Vital Signs ED Triage  Vitals [03/23/22 1319]  Enc Vitals Group     BP 112/64     Pulse Rate 87     Resp 18     Temp 98.2 F (36.8 C)     Temp src      SpO2 98 %     Weight      Height      Head Circumference      Peak Flow      Pain Score 0     Pain Loc      Pain Edu?      Excl. in [place]?    No data found.  Updated Vital Signs BP 112/64   Pulse 87   Temp 98.2 F (36.8 C)   Resp 18   LMP 03/16/2022 (Approximate)   SpO2 98%   Visual Acuity Right Eye Distance:   Left Eye Distance:   Bilateral Distance:    Right Eye Near:   Left Eye Near:    Bilateral Near:     Physical Exam Vitals reviewed.  Constitutional:      Appearance: Normal appearance. She is ill-appearing.  HENT:     Nose:     Right Sinus: Maxillary sinus tenderness and frontal sinus tenderness present.     Left Sinus: Maxillary sinus tenderness and frontal sinus tenderness present.  Eyes:     Conjunctiva/sclera: Conjunctivae normal.     Pupils: Pupils are equal, round, and reactive to light.  Cardiovascular:     Rate and Rhythm: Normal rate and regular rhythm.  Pulmonary:     Effort: Pulmonary effort is normal.     Breath sounds: Normal breath sounds. No wheezing.  Skin:    General: Skin is warm and dry.  Neurological:     General: No focal deficit present.     Mental Status: She is alert and oriented to person, place, and time.  Psychiatric:        Mood and Affect: Mood normal.        Behavior: Behavior normal.      UC Treatments / Results  Labs (all labs ordered are listed, but only abnormal results are displayed) Labs Reviewed - No data to display  EKG   Radiology No results found.  Procedures Procedures  (including critical care time)  Medications Ordered in UC Medications - No data to display  Initial Impression / Assessment and Plan / UC Course  I have reviewed the triage vital signs and the nursing notes.  Pertinent labs & imaging results that were available during my care of the patient were reviewed by me and considered in my medical decision making (see chart for details).   Patient is afebrile here without recent antipyretics. Satting well on room air. Overall is ill appearing, though well hydrated and without respiratory distress. Pulmonary exam is unremarkable.  Frontal and maxillary sinus tenderness bilaterally.  Will treat acute bacterial sinusitis with a course of Augmentin and also prescribed prednisone to reduce sinus inflammation.  Final Clinical Impressions(s) / UC Diagnoses   Final diagnoses:  None   Discharge Instructions   None    ED Prescriptions   None    PDMP not reviewed this encounter.   Rose Phi, [place] 03/23/22 1346

## 2022-03-23 NOTE — Discharge Instructions (Signed)
Follow up here or with your primary care provider if your symptoms are worsening or not improving with treatment.     

## 2022-03-23 NOTE — ED Triage Notes (Signed)
Pt. Presents to UC w/c/o a productive cough that started 4 weeks ago. Pt. Also endorses body aches, fatigue, nasal drainage, and sinus pressure that started 2 weeks ago.

## 2022-03-26 ENCOUNTER — Other Ambulatory Visit: Payer: Self-pay

## 2022-04-03 ENCOUNTER — Other Ambulatory Visit: Payer: Self-pay

## 2022-04-12 ENCOUNTER — Other Ambulatory Visit: Payer: Self-pay

## 2022-04-24 ENCOUNTER — Other Ambulatory Visit: Payer: Self-pay

## 2022-04-24 MED ORDER — WEGOVY 1 MG/0.5ML ~~LOC~~ SOAJ
1.0000 mg | SUBCUTANEOUS | 0 refills | Status: DC
  Filled 2022-04-24: qty 2, 28d supply, fill #0

## 2022-04-24 MED ORDER — AMPHETAMINE-DEXTROAMPHET ER 20 MG PO CP24
20.0000 mg | ORAL_CAPSULE | ORAL | 0 refills | Status: DC
  Filled 2022-04-24: qty 30, 30d supply, fill #0

## 2022-05-01 ENCOUNTER — Other Ambulatory Visit: Payer: Self-pay

## 2022-05-03 ENCOUNTER — Other Ambulatory Visit: Payer: Self-pay

## 2022-05-06 ENCOUNTER — Other Ambulatory Visit: Payer: Self-pay

## 2022-05-06 DIAGNOSIS — M5442 Lumbago with sciatica, left side: Secondary | ICD-10-CM | POA: Diagnosis not present

## 2022-05-06 DIAGNOSIS — M5441 Lumbago with sciatica, right side: Secondary | ICD-10-CM | POA: Diagnosis not present

## 2022-05-06 DIAGNOSIS — M48062 Spinal stenosis, lumbar region with neurogenic claudication: Secondary | ICD-10-CM | POA: Diagnosis not present

## 2022-05-06 DIAGNOSIS — G8929 Other chronic pain: Secondary | ICD-10-CM | POA: Diagnosis not present

## 2022-05-06 MED ORDER — TIZANIDINE HCL 2 MG PO TABS
2.0000 mg | ORAL_TABLET | Freq: Three times a day (TID) | ORAL | 4 refills | Status: DC | PRN
  Filled 2022-05-06: qty 90, 30d supply, fill #0

## 2022-05-08 ENCOUNTER — Other Ambulatory Visit: Payer: Self-pay

## 2022-05-22 ENCOUNTER — Other Ambulatory Visit: Payer: Self-pay

## 2022-05-22 MED ORDER — WEGOVY 1 MG/0.5ML ~~LOC~~ SOAJ
1.0000 mg | SUBCUTANEOUS | 0 refills | Status: DC
  Filled 2022-05-22 – 2022-06-10 (×2): qty 2, 28d supply, fill #0

## 2022-06-09 ENCOUNTER — Other Ambulatory Visit: Payer: Self-pay

## 2022-06-10 ENCOUNTER — Other Ambulatory Visit: Payer: Self-pay

## 2022-06-11 ENCOUNTER — Other Ambulatory Visit: Payer: Self-pay

## 2022-06-11 MED ORDER — AMPHETAMINE-DEXTROAMPHET ER 20 MG PO CP24
20.0000 mg | ORAL_CAPSULE | Freq: Every day | ORAL | 0 refills | Status: DC
  Filled 2022-06-11: qty 30, 30d supply, fill #0

## 2022-06-14 DIAGNOSIS — K219 Gastro-esophageal reflux disease without esophagitis: Secondary | ICD-10-CM | POA: Diagnosis not present

## 2022-06-14 DIAGNOSIS — R7301 Impaired fasting glucose: Secondary | ICD-10-CM | POA: Diagnosis not present

## 2022-06-28 ENCOUNTER — Other Ambulatory Visit: Payer: Self-pay

## 2022-06-28 DIAGNOSIS — R82998 Other abnormal findings in urine: Secondary | ICD-10-CM | POA: Diagnosis not present

## 2022-06-28 MED ORDER — OMEPRAZOLE 40 MG PO CPDR
40.0000 mg | DELAYED_RELEASE_CAPSULE | Freq: Every day | ORAL | 3 refills | Status: DC
  Filled 2022-06-28: qty 90, 90d supply, fill #0

## 2022-06-28 MED ORDER — WEGOVY 1 MG/0.5ML ~~LOC~~ SOAJ
1.0000 mg | SUBCUTANEOUS | 0 refills | Status: DC
  Filled 2022-06-28: qty 2, 28d supply, fill #0

## 2022-06-28 MED ORDER — AMPHETAMINE-DEXTROAMPHET ER 20 MG PO CP24
20.0000 mg | ORAL_CAPSULE | Freq: Every day | ORAL | 0 refills | Status: DC
  Filled 2022-09-30: qty 30, 30d supply, fill #0

## 2022-06-28 MED ORDER — FOLIC ACID 1 MG PO TABS
1.0000 mg | ORAL_TABLET | Freq: Every day | ORAL | 0 refills | Status: DC
  Filled 2022-06-28: qty 30, 30d supply, fill #0

## 2022-07-01 ENCOUNTER — Ambulatory Visit: Payer: 59 | Attending: Physical Medicine & Rehabilitation | Admitting: Physical Therapy

## 2022-07-01 DIAGNOSIS — M5459 Other low back pain: Secondary | ICD-10-CM | POA: Insufficient documentation

## 2022-07-01 NOTE — Therapy (Signed)
OUTPATIENT PHYSICAL THERAPY THORACOLUMBAR EVALUATION   Patient Name: Theresa Ortiz MRN: [id number] DOB:[birth year], 32 y.o., female Today's Date: 07/02/2022  END OF SESSION:  PT End of Session - 07/02/22 0956     Visit Number 1    Number of Visits 17    Date for PT Re-Evaluation 08/30/22    Authorization - Visit Number 1    Authorization - Number of Visits 10    Progress Note Due on Visit 10    PT Start Time 1050    PT Stop Time 1130    PT Time Calculation (min) 40 min    Activity Tolerance Patient tolerated treatment well    Behavior During Therapy [facility] for tasks assessed/performed             Past Medical History:  Diagnosis Date   Acute meniscal tear of knee    left knee   Allergy    Anxiety    Asthma    Atrial fibrillation ([facility])    slight   COVID-19 04/2020   Depression    Ovarian cyst    PVC (premature ventricular contraction)    Scoliosis    Shingles    Tachycardia    Past Surgical History:  Procedure Laterality Date   KNEE ARTHROSCOPY WITH LATERAL MENISECTOMY Left 09/01/2012   Procedure: LEFT KNEE ARTHROSCOPY WITH LATERAL MENISCECTOMY ;  Surgeon: Jacki Cones, MD;  Location: [facility] [place];  Service: Orthopedics;  Laterality: Left;   MENISCUS REPAIR Right    RIGHT INDEX  FINGER TENDON REPAIR  NOV 2013   Patient Active Problem List   Diagnosis Date Noted   Palpitation 07/13/2018   Atypical chest pain 07/13/2018   Family history of heart disease 07/13/2018   Shortness of breath 07/13/2018   Sinus tachycardia 07/13/2018   Acute lateral meniscus tear of left knee 09/01/2012    PCP: Tisovec MD  REFERRING PROVIDER: Tisovec MD  REFERRING DIAG: LBP with sciatica  Rationale for Evaluation and Treatment: Rehabilitation  THERAPY DIAG:  Other low back pain  ONSET DATE: Feb 2023  SUBJECTIVE:                                                                                                                                                                                            SUBJECTIVE STATEMENT: L sided LBP w/ sciatica  PERTINENT HISTORY:  Pt is a 32 year old female presenting with L sided LBP since Feb 2023 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 Pt reports she has some pain at the mid calf that is the size of packing tape that feels  like a cramp. She reports she has tension at L low back and glute. LBP/glute pain aggravated by lifting/moving patients, and bending forward. Has had L sided sciatic symptoms 1x/week when lifting something heavy down post LLE. She hs previously had pain with prolonged walking, but that has subsided over the past year or so. Has had successful PT in the past with [facility], which she thinks is very helpful. Pt works full time as an Engineer, structural, enjoys traveling and walking her dog. Current pain 1/10, worst pain 4/10; best 1/10. Reports wearing crocs work shoes for work mostly, she had a fitting at [employer] but felt like those shoes pushed her arch too much. No falls in past 6 months.  denies N/V, B&B changes, unexplained weight fluctuation, saddle paresthesia, fever, night sweats, or unrelenting night pain at this time.   PAIN:  Are you having pain? Yes: NPRS scale: 1/10 Pain location: L sided LBP/superior glute; calf cramp Pain description: tension, ache, "nerve" pain at L glute  Aggravating factors: lifting, bending forward, prolonged walking Relieving factors: rest  PRECAUTIONS: None  WEIGHT BEARING RESTRICTIONS: No  FALLS:  Has patient fallen in last 6 months? No  LIVING ENVIRONMENT: Lives with: lives alone Lives in: House/apartment Stairs: No Has following equipment at home:  none  OCCUPATION: [employer] tech  PLOF: Independent  PATIENT GOALS: Get the cramp out of my calf  NEXT MD VISIT: 6 weeks  OBJECTIVE:   DIAGNOSTIC FINDINGS:  MRI  "I have buldging discs at L4-5 S1; T 10-12"  PATIENT SURVEYS:  FOTO 53 goal of 66  SCREENING FOR RED FLAGS: Bowel or  bladder incontinence: No Spinal tumors: No Cauda equina syndrome: No Compression fracture: No Abdominal aneurysm: No  COGNITION: Overall cognitive status: Within functional limits for tasks assessed     SENSATION: WFL  MUSCLE LENGTH: Hamstrings: full length but with tension at end range bilat   POSTURE: rounded shoulders, forward head, increased lumbar lordosis, and increased thoracic kyphosis standing hanging on Y ligaments  PALPATION: TTP with concordant pain sign to palpation of superior L glute max; and with deep palpation over mid glute max/deep rotators. Secondary pain to L lumbar paraspinals, QL at pelvic attachment with latent trigger points  LUMBAR ROM:   AROM eval  Flexion WNL  Extension WNL  Right lateral flexion WNL  Left lateral flexion WNL  Right rotation WNL  Left rotation WNL   (Blank rows = not tested)  LOWER EXTREMITY MMT:     MMT  Right eval Left eval  Hip flexion 5 5  Hip extension 5 4+  Hip abduction 5 5  Hip adduction 5 5  Hip internal rotation 5 5  Hip external rotation 5 4  Knee flexion    Knee extension    Ankle dorsiflexion    Ankle plantarflexion    Ankle inversion    Ankle eversion     (Blank rows = not tested)  LOWER EXTREMITY ROM:    All mobility WNL- gross hypermobility  LUMBAR SPECIAL TESTS:  Straight leg raise test: Negative, Slump test: Negative, Single leg stance test: Negative, FABER test: Negative, and Thomas test: Negative    FUNCTIONAL TESTS:  Beighton scale 7/9 Lacking knee hypermobility- has had "meniscus surgery in both"   Plank test [employer] 20sec GAIT: Distance walked: [employer] device utilized: None Level of assistance: Complete Independence Comments: normal walk speed, no gross deficits  TODAY'S TREATMENT:  DATE: 07/01/22 Access Code: [email or web address] URL:  [email or web address] Date: 07/01/2022 Prepared by: Hilda Lias  Exercises - Supine Sciatic Nerve Glide  - 1-2 x daily - 7 x weekly - 10 reps - Beginner Bridge  - 5 x weekly - 2 sets - 12 reps    PATIENT EDUCATION:  Education details: Patient was educated on diagnosis, anatomy and pathology involved, prognosis, role of PT, and was given an HEP, demonstrating exercise with proper form following verbal and tactile cues, and was given a paper hand out to continue exercise at home. Pt was educated on and agreed to plan of care.  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 Signs and symptoms of piriformis syndrome. Impairments of decreased core and lumbar extensor strength, decreased hip strength, gross joint hypermobility with increased compensatory muscle tension, abnormal posture, and pain. Activity limitations in prolonged walking, pulling/pushing, lifting, squatting; inhibiting household ADLs and occupational duties as Chemical engineer. Would benefit from skilled PT to address above deficits and promote optimal return to PLOF.   OBJECTIVE IMPAIRMENTS: Abnormal gait, decreased activity tolerance, decreased balance, decreased coordination, decreased endurance, decreased mobility, difficulty walking, decreased ROM, decreased strength, increased fascial restrictions, impaired flexibility, improper body mechanics, postural dysfunction, and pain.   ACTIVITY LIMITATIONS: carrying, lifting, standing, squatting, stairs, transfers, and hygiene/grooming  PARTICIPATION LIMITATIONS: meal prep, cleaning, driving, shopping, community activity, occupation,  and yard work  PERSONAL FACTORS: Fitness, Past/current experiences, Time since onset of injury/illness/exacerbation, and 1-2 comorbidities: A&D chronic LBP  are also affecting patient's functional outcome.   REHAB POTENTIAL: Good  CLINICAL DECISION MAKING: Evolving/moderate complexity  EVALUATION COMPLEXITY: Moderate   GOALS: Goals reviewed with patient? Yes  SHORT TERM GOALS: Target date: 08/07/22  Pt will be independent with HEP in order to improve strength and balance in order to decrease fall risk and improve function at home and work.  Baseline:HEP given  Goal status: INITIAL   LONG TERM GOALS: Target date: 08/26/22  Patient will increase FOTO score to 63 to demonstrate predicted increase in functional mobility to complete ADLs  Baseline: 55 Goal status: INITIAL  2.  Pt will decrease worst pain as reported on NPRS by at least 3 points in order to demonstrate clinically significant reduction in pain.  Baseline: 4/10 Goal status: INITIAL  3.  Pt will demonstrate plank time of or more to demonstrate age-predicted match of core strength needed for job duties Baseline: 10sec Goal status: INITIAL  4.  Pt will demonstrate bering sorenson test of 34sec or more to demonstrate clinically significant increase in spinal extensor strength needed for job duties Baseline: 20sec Goal status: INITIAL    PLAN:  PT FREQUENCY: 1-2x/week  PT DURATION: 8 weeks  PLANNED INTERVENTIONS: Therapeutic exercises, Therapeutic activity, Neuromuscular re-education, Balance training, Gait training, Patient/Family education, Self Care, Joint mobilization, Joint manipulation, Stair training, Aquatic Therapy, Dry Needling, Electrical stimulation, Spinal manipulation, Spinal mobilization, Cryotherapy, Moist heat, Taping, Traction, Ultrasound, Fluidotherapy, Ionotophoresis 4mg /ml Dexamethasone, Manual therapy, and Re-evaluation.  PLAN FOR NEXT SESSION: [facility]  Hilda Lias DPT Hilda Lias, PT 07/02/2022, 9:58 AM

## 2022-07-02 ENCOUNTER — Other Ambulatory Visit: Payer: Self-pay

## 2022-07-02 ENCOUNTER — Encounter: Payer: Self-pay | Admitting: Physical Therapy

## 2022-07-05 ENCOUNTER — Ambulatory Visit: Payer: 59 | Admitting: Physical Therapy

## 2022-07-08 ENCOUNTER — Encounter: Payer: 59 | Admitting: Physical Therapy

## 2022-07-12 ENCOUNTER — Ambulatory Visit: Payer: 59 | Admitting: Physical Therapy

## 2022-07-12 ENCOUNTER — Encounter: Payer: Self-pay | Admitting: Physical Therapy

## 2022-07-12 DIAGNOSIS — M5459 Other low back pain: Secondary | ICD-10-CM | POA: Diagnosis not present

## 2022-07-12 NOTE — Therapy (Signed)
OUTPATIENT PHYSICAL THERAPY THORACOLUMBAR EVALUATION   Patient Name: Theresa Ortiz MRN: [id number] DOB:1990/09/13, 32 y.o., female Today's Date: 07/12/2022  END OF SESSION:  PT End of Session - 07/12/22 1007     Visit Number 2    Number of Visits 17    Date for PT Re-Evaluation 08/30/22    Authorization - Visit Number 2    Authorization - Number of Visits 10    Progress Note Due on Visit 10    PT Start Time 1004    PT Stop Time 1043    PT Time Calculation (min) 39 min    Activity Tolerance Patient tolerated treatment well    Behavior During Therapy [facility] for tasks assessed/performed             Past Medical History:  Diagnosis Date   Acute meniscal tear of knee    left knee   Allergy    Anxiety    Asthma    Atrial fibrillation ([place])    slight   COVID-19 04/2020   Depression    Ovarian cyst    PVC (premature ventricular contraction)    Scoliosis    Shingles    Tachycardia    Past Surgical History:  Procedure Laterality Date   KNEE ARTHROSCOPY WITH LATERAL MENISECTOMY Left 09/01/2012   Procedure: LEFT KNEE ARTHROSCOPY WITH LATERAL MENISCECTOMY ;  Surgeon: Tobi Bastos, MD;  Location: [place];  Service: Orthopedics;  Laterality: Left;   MENISCUS REPAIR Right    RIGHT INDEX  FINGER TENDON REPAIR  NOV 2013   Patient Active Problem List   Diagnosis Date Noted   Palpitation 07/13/2018   Atypical chest pain 07/13/2018   Family history of heart disease 07/13/2018   Shortness of breath 07/13/2018   Sinus tachycardia 07/13/2018   Acute lateral meniscus tear of left knee 09/01/2012    PCP: Tisovec MD  REFERRING PROVIDER: Tisovec MD  REFERRING DIAG: LBP with sciatica  Rationale for Evaluation and Treatment: Rehabilitation  THERAPY DIAG:  Other low back pain  ONSET DATE: Feb 2023  SUBJECTIVE:                                                                                                                                                                                            SUBJECTIVE STATEMENT: Pt reports her back is doing okay, but she has had to do a lot of lifting at work this week which has briefly irritated back and calf cramp. This am she reports 1/10 pain.   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 Pt reports she has some pain at the mid calf that is the size of packing tape that feels like a cramp. She reports she has tension at L low back and glute. LBP/glute pain aggravated by lifting/moving patients, and bending forward. Has had L sided sciatic symptoms 1x/week when lifting something heavy down post LLE. She hs previously had pain with prolonged walking, but that has subsided over the past year or so. Has had successful PT in the past with [facility], which she thinks is very helpful. 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 Current pain 1/10, worst pain 4/10; best 1/10. Reports wearing crocs work shoes for work mostly, she had a fitting at [employer] but felt like those shoes pushed her arch too much. No falls in past 6 months.  denies N/V, B&B changes, unexplained weight fluctuation, saddle paresthesia, fever, night sweats, or unrelenting night pain at this time.   PAIN:  Are you having pain? Yes: NPRS scale: 1/10 Pain location: L sided LBP/superior glute; calf cramp Pain description: tension, ache, "nerve" pain at L glute  Aggravating factors: lifting, bending forward, prolonged walking Relieving factors: rest  PRECAUTIONS: None  WEIGHT BEARING RESTRICTIONS: No  FALLS:  Has patient fallen in last 6 months? No  LIVING ENVIRONMENT: Lives with: lives alone Lives in: House/apartment Stairs: No Has following equipment at home:  none  OCCUPATION: [employer] tech  PLOF: Independent  PATIENT GOALS: Get the cramp out of my calf  NEXT MD VISIT: 6 weeks  OBJECTIVE:    DIAGNOSTIC FINDINGS:  MRI  "I have buldging discs at L4-5 S1; T 10-12"  PATIENT SURVEYS:  FOTO 53 goal of 66  SCREENING FOR RED FLAGS: Bowel or bladder incontinence: No Spinal tumors: No Cauda equina syndrome: No Compression fracture: No Abdominal aneurysm: No  COGNITION: Overall cognitive status: Within functional limits for tasks assessed     SENSATION: WFL  MUSCLE LENGTH: Hamstrings: full length but with tension at end range bilat   POSTURE: rounded shoulders, forward head, increased lumbar lordosis, and increased thoracic kyphosis standing hanging on Y ligaments  PALPATION: TTP with concordant pain sign to palpation of superior L glute max; and with deep palpation over mid glute max/deep rotators. Secondary pain to L lumbar paraspinals, QL at pelvic attachment with latent trigger points  LUMBAR ROM:   AROM eval  Flexion WNL  Extension WNL  Right lateral flexion WNL  Left lateral flexion WNL  Right rotation WNL  Left rotation WNL   (Blank rows = not tested)  LOWER EXTREMITY MMT:     MMT  Right eval Left eval  Hip flexion 5 5  Hip extension 5 4+  Hip abduction 5 5  Hip adduction 5 5  Hip internal rotation 5 5  Hip external rotation 5 4  Knee flexion    Knee extension    Ankle dorsiflexion    Ankle plantarflexion    Ankle inversion    Ankle eversion     (Blank rows = not tested)  LOWER EXTREMITY ROM:    All mobility WNL- gross hypermobility  LUMBAR SPECIAL TESTS:  Straight leg raise test: Negative, Slump test: Negative, Single leg stance test: Negative, FABER test: Negative, and Thomas test: Negative    FUNCTIONAL TESTS:  Beighton scale 7/9 Lacking knee hypermobility- has had "meniscus surgery in both"   Plank test [employer] 20sec GAIT: Distance walked:  16M Assistive device utilized: None Level of assistance: Complete Independence Comments: normal walk speed, no gross deficits  TODAY'S TREATMENT:                                                                                                                               DATE: 07/01/22 Ther-Ex Nustep seat 9 UE 12 L3 57mins for gentle mobility and strengthening Bridge marching 2x 12 with good carry over of cuing for initial position Bird dog UE only 2x 12 with good carry over of max cuing for set up Seated on theraball TA marching 2x 12 with good carry over of initial demo/cuing for technique Seated glute stretch x30secH  Manual STM with trigger point release to glute max at iliac crest insertion, over deep rotators and L lumbar QL/paraspinals Following Dry Needling: (1/2/1) 75/100/152mm .30 needles placed along the L lumbar parapsinals/L glute max/L piriformis to decrease increased muscular spasms and trigger points with the patient positioned in prone. Patient was educated on risks and benefits of therapy and verbally consents to PT.      PATIENT EDUCATION:  Education details: Patient was educated on diagnosis, anatomy and pathology involved, prognosis, role of PT, and was given an HEP, demonstrating exercise with proper form following verbal and tactile cues, and was given a paper hand out to continue exercise at home. Pt was educated on and agreed to plan of care.  Person educated: Patient Education method: Explanation, Demonstration, and Handouts Education comprehension: verbalized understanding and returned demonstration  HOME EXERCISE PROGRAM: [id number]  ASSESSMENT:  CLINICAL IMPRESSION: PT initiated therex progression for increased core/hip stability and manual with TPDN techniques to reduce glute max and lumbar paraspinal tension with success. Patient is able to comply with all ciung for proper technique oof therex with good motivation throughout session. Patient is able to progress through functional movement positions demonstrating neutral posture carry over in these. PT will continue progression as able.    OBJECTIVE IMPAIRMENTS: Abnormal gait,  decreased activity tolerance, decreased balance, decreased coordination, decreased endurance, decreased mobility, difficulty walking, decreased ROM, decreased strength, increased fascial restrictions, impaired flexibility, improper body mechanics, postural dysfunction, and pain.   ACTIVITY LIMITATIONS: carrying, lifting, standing, squatting, stairs, transfers, and hygiene/grooming  PARTICIPATION LIMITATIONS: meal prep, cleaning, driving, shopping, community activity, occupation, and yard work  PERSONAL FACTORS: Fitness, Past/current experiences, Time since onset of injury/illness/exacerbation, and 1-2 comorbidities: A&D chronic LBP  are also affecting patient's functional outcome.   REHAB POTENTIAL: Good  CLINICAL DECISION MAKING: Evolving/moderate complexity  EVALUATION COMPLEXITY: Moderate   GOALS: Goals reviewed with patient? Yes  SHORT TERM GOALS: Target date: 08/07/22  Pt will be independent with HEP in order to improve strength and balance in order to decrease fall risk and improve function at home and work.  Baseline:HEP given  Goal status: INITIAL   LONG TERM GOALS: Target date: 08/26/22  Patient will increase FOTO score to 63 to demonstrate predicted increase in functional mobility to  complete ADLs  Baseline: 55 Goal status: INITIAL  2.  Pt will decrease worst pain as reported on NPRS by at least 3 points in order to demonstrate clinically significant reduction in pain.  Baseline: 4/10 Goal status: INITIAL  3.  Pt will demonstrate plank time of 45min or more to demonstrate age-predicted match of core strength needed for job duties Baseline: 10sec Goal status: INITIAL  4.  Pt will demonstrate bering sorenson test of 34sec or more to demonstrate clinically significant increase in spinal extensor strength needed for job duties Baseline: 20sec Goal status: INITIAL    PLAN:  PT FREQUENCY: 1-2x/week  PT DURATION: 8 weeks  PLANNED INTERVENTIONS: Therapeutic  exercises, Therapeutic activity, Neuromuscular re-education, Balance training, Gait training, Patient/Family education, Self Care, Joint mobilization, Joint manipulation, Stair training, Aquatic Therapy, Dry Needling, Electrical stimulation, Spinal manipulation, Spinal mobilization, Cryotherapy, Moist heat, Taping, Traction, Ultrasound, Fluidotherapy, Ionotophoresis 4mg /ml Dexamethasone, Manual therapy, and Re-evaluation.  PLAN FOR NEXT SESSION: TPDN  Durwin Reges DPT Durwin Reges, PT 07/12/2022, 11:01 AM

## 2022-07-15 ENCOUNTER — Encounter: Payer: 59 | Admitting: Physical Therapy

## 2022-07-16 ENCOUNTER — Other Ambulatory Visit: Payer: Self-pay

## 2022-07-16 MED ORDER — WEGOVY 1 MG/0.5ML ~~LOC~~ SOAJ
1.0000 mg | SUBCUTANEOUS | 0 refills | Status: DC
  Filled 2022-07-16: qty 2, 28d supply, fill #0

## 2022-07-17 ENCOUNTER — Other Ambulatory Visit: Payer: Self-pay

## 2022-07-19 ENCOUNTER — Ambulatory Visit: Payer: 59 | Admitting: Physical Therapy

## 2022-07-22 ENCOUNTER — Encounter: Payer: 59 | Admitting: Physical Therapy

## 2022-07-23 ENCOUNTER — Other Ambulatory Visit: Payer: Self-pay

## 2022-07-25 ENCOUNTER — Other Ambulatory Visit: Payer: Self-pay

## 2022-07-25 MED ORDER — METOPROLOL SUCCINATE ER 25 MG PO TB24
25.0000 mg | ORAL_TABLET | Freq: Every day | ORAL | 3 refills | Status: DC
  Filled 2022-07-25: qty 90, 90d supply, fill #0
  Filled 2022-11-04: qty 90, 90d supply, fill #1
  Filled 2022-12-16 – 2023-02-05 (×2): qty 90, 90d supply, fill #2
  Filled 2023-07-07: qty 90, 90d supply, fill #3

## 2022-07-26 ENCOUNTER — Ambulatory Visit: Payer: 59 | Admitting: Physical Therapy

## 2022-07-26 ENCOUNTER — Encounter: Payer: Self-pay | Admitting: Physical Therapy

## 2022-07-26 DIAGNOSIS — M5459 Other low back pain: Secondary | ICD-10-CM | POA: Diagnosis not present

## 2022-07-26 NOTE — Therapy (Signed)
OUTPATIENT PHYSICAL THERAPY THORACOLUMBAR EVALUATION   Patient Name: Theresa Ortiz MRN: [id number] DOB:Sep 30, 1990, 32 y.o., female Today's Date: 07/26/2022  END OF SESSION:  PT End of Session - 07/26/22 1049     Visit Number 3    Number of Visits 17    Date for PT Re-Evaluation 08/30/22    Authorization - Visit Number 3    Authorization - Number of Visits 10    Progress Note Due on Visit 10    PT Start Time [id number]    PT Stop Time 1125    PT Time Calculation (min) 40 min    Activity Tolerance Patient tolerated treatment well    Behavior During Therapy [facility] for tasks assessed/performed              Past Medical History:  Diagnosis Date   Acute meniscal tear of knee    left knee   Allergy    Anxiety    Asthma    Atrial fibrillation ([place])    slight   COVID-19 04/2020   Depression    Ovarian cyst    PVC (premature ventricular contraction)    Scoliosis    Shingles    Tachycardia    Past Surgical History:  Procedure Laterality Date   KNEE ARTHROSCOPY WITH LATERAL MENISECTOMY Left 09/01/2012   Procedure: LEFT KNEE ARTHROSCOPY WITH LATERAL MENISCECTOMY ;  Surgeon: Tobi Bastos, MD;  Location: [place];  Service: Orthopedics;  Laterality: Left;   MENISCUS REPAIR Right    RIGHT INDEX  FINGER TENDON REPAIR  NOV 2013   Patient Active Problem List   Diagnosis Date Noted   Palpitation 07/13/2018   Atypical chest pain 07/13/2018   Family history of heart disease 07/13/2018   Shortness of breath 07/13/2018   Sinus tachycardia 07/13/2018   Acute lateral meniscus tear of left knee 09/01/2012    PCP: Tisovec MD  REFERRING PROVIDER: Tisovec MD  REFERRING DIAG: LBP with sciatica  Rationale for Evaluation and Treatment: Rehabilitation  THERAPY DIAG:  Other low back pain  ONSET DATE: Feb 2023  SUBJECTIVE:                                                                                                                                                                                            SUBJECTIVE STATEMENT: Pt reports she was very sore following last session from [employer]PD[employer], but that it has decreased over time.    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 Pt reports she  has some pain at the mid calf that is the size of packing tape that feels like a cramp. She reports she has tension at L low back and glute. LBP/glute pain aggravated by lifting/moving patients, and bending forward. Has had L sided sciatic symptoms 1x/week when lifting something heavy down post LLE. She hs previously had pain with prolonged walking, but that has subsided over the past year or so. Has had successful PT in the past with [facility], which she thinks is very helpful. 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 Current pain 1/10, worst pain 4/10; best 1/10. Reports wearing crocs work shoes for work mostly, she had a fitting at [employer] but felt like those shoes pushed her arch too much. No falls in past 6 months.  denies N/V, B&B changes, unexplained weight fluctuation, saddle paresthesia, fever, night sweats, or unrelenting night pain at this time.   PAIN:  Are you having pain? Yes: NPRS scale: 1/10 Pain location: L sided LBP/superior glute; calf cramp Pain description: tension, ache, "nerve" pain at L glute  Aggravating factors: lifting, bending forward, prolonged walking Relieving factors: rest  PRECAUTIONS: None  WEIGHT BEARING RESTRICTIONS: No  FALLS:  Has patient fallen in last 6 months? No  LIVING ENVIRONMENT: Lives with: lives alone Lives in: House/apartment Stairs: No Has following equipment at home:  none  OCCUPATION: [employer] tech  PLOF: Independent  PATIENT GOALS: Get the cramp out of my calf  NEXT MD VISIT: 6 weeks  OBJECTIVE:   DIAGNOSTIC FINDINGS:  MRI  "I have buldging discs at L4-5 S1; T  10-12"  PATIENT SURVEYS:  FOTO 53 goal of 66  SCREENING FOR RED FLAGS: Bowel or bladder incontinence: No Spinal tumors: No Cauda equina syndrome: No Compression fracture: No Abdominal aneurysm: No  COGNITION: Overall cognitive status: Within functional limits for tasks assessed     SENSATION: WFL  MUSCLE LENGTH: Hamstrings: full length but with tension at end range bilat   POSTURE: rounded shoulders, forward head, increased lumbar lordosis, and increased thoracic kyphosis standing hanging on Y ligaments  PALPATION: TTP with concordant pain sign to palpation of superior L glute max; and with deep palpation over mid glute max/deep rotators. Secondary pain to L lumbar paraspinals, QL at pelvic attachment with latent trigger points  LUMBAR ROM:   AROM eval  Flexion WNL  Extension WNL  Right lateral flexion WNL  Left lateral flexion WNL  Right rotation WNL  Left rotation WNL   (Blank rows = not tested)  LOWER EXTREMITY MMT:     MMT  Right eval Left eval  Hip flexion 5 5  Hip extension 5 4+  Hip abduction 5 5  Hip adduction 5 5  Hip internal rotation 5 5  Hip external rotation 5 4  Knee flexion    Knee extension    Ankle dorsiflexion    Ankle plantarflexion    Ankle inversion    Ankle eversion     (Blank rows = not tested)  LOWER EXTREMITY ROM:    All mobility WNL- gross hypermobility  LUMBAR SPECIAL TESTS:  Straight leg raise test: Negative, Slump test: Negative, Single leg stance test: Negative, FABER test: Negative, and Thomas test: Negative    FUNCTIONAL TESTS:  Beighton scale 7/9 Lacking knee hypermobility- has had "meniscus surgery in both"   Plank test [employer] 20sec GAIT: Distance walked: [employer] device utilized: None Level of assistance: Complete Independence Comments: normal walk speed, no  gross deficits  TODAY'S TREATMENT:                                                                                                                               DATE: 07/26/22 Ther-Ex Nustep seat 9 UE 12 L3 45mins for gentle mobility and strengthening Lower trunk rotation hooklying x20 with min cuing for spine contact with mat SKTC in hooklying x10sec bilat ; with CLLE ext x10 bilat Open books x12 each side; min cuing to maintain knee contact to mat table Seated glute stretch x30secH  Manual Manual lumbar traction with theraball 10sec on/off x12 Maintaed above traction with alt rotation x20  Long axis distraction 10sec x6 STM with trigger point release to glute max at iliac crest insertion, over deep rotators and L lumbar QL/paraspinals Following Dry Needling: (1/2/1) 75/100/153mm .30 needles placed along the L lumbar parapsinals/L glute max/L piriformis to decrease increased muscular spasms and trigger points with the patient positioned in prone. Patient was educated on risks and benefits of therapy and verbally consents to PT.      PATIENT EDUCATION:  Education details: Patient was educated on diagnosis, anatomy and pathology involved, prognosis, role of PT, and was given an HEP, demonstrating exercise with proper form following verbal and tactile cues, and was given a paper hand out to continue exercise at home. Pt was educated on and agreed to plan of care.  Person educated: Patient Education method: Explanation, Demonstration, and Handouts Education comprehension: verbalized understanding and returned demonstration  HOME EXERCISE PROGRAM: [id number]  ASSESSMENT:  CLINICAL IMPRESSION: PT utilized mobility therex and manual with TPDN techniques to reduce glute max and lumbar paraspinal tension and pain with success. PT with more analgesic focus of therex > needed stability focus d/t increased pain report from patient. Pt able to verbally review birddog and bridge therex for HEP. Patient is able to comply with all ciung for proper technique oof therex with good motivation throughout session. Patient is able to  progress through functional movement positions demonstrating neutral posture carry over in these. PT will continue progression as able.    OBJECTIVE IMPAIRMENTS: Abnormal gait, decreased activity tolerance, decreased balance, decreased coordination, decreased endurance, decreased mobility, difficulty walking, decreased ROM, decreased strength, increased fascial restrictions, impaired flexibility, improper body mechanics, postural dysfunction, and pain.   ACTIVITY LIMITATIONS: carrying, lifting, standing, squatting, stairs, transfers, and hygiene/grooming  PARTICIPATION LIMITATIONS: meal prep, cleaning, driving, shopping, community activity, occupation, and yard work  PERSONAL FACTORS: Fitness, Past/current experiences, Time since onset of injury/illness/exacerbation, and 1-2 comorbidities: A&D chronic LBP  are also affecting patient's functional outcome.   REHAB POTENTIAL: Good  CLINICAL DECISION MAKING: Evolving/moderate complexity  EVALUATION COMPLEXITY: Moderate   GOALS: Goals reviewed with patient? Yes  SHORT TERM GOALS: Target date: 08/07/22  Pt will be independent with HEP in order to improve strength and balance in order to decrease fall risk and improve function at home and work.  Baseline:HEP given  Goal status:  INITIAL   LONG TERM GOALS: Target date: 08/26/22  Patient will increase FOTO score to 63 to demonstrate predicted increase in functional mobility to complete ADLs  Baseline: 55 Goal status: INITIAL  2.  Pt will decrease worst pain as reported on NPRS by at least 3 points in order to demonstrate clinically significant reduction in pain.  Baseline: 4/10 Goal status: INITIAL  3.  Pt will demonstrate plank time of 76min or more to demonstrate age-predicted match of core strength needed for job duties Baseline: 10sec Goal status: INITIAL  4.  Pt will demonstrate bering sorenson test of 34sec or more to demonstrate clinically significant increase in spinal  extensor strength needed for job duties Baseline: 20sec Goal status: INITIAL    PLAN:  PT FREQUENCY: 1-2x/week  PT DURATION: 8 weeks  PLANNED INTERVENTIONS: Therapeutic exercises, Therapeutic activity, Neuromuscular re-education, Balance training, Gait training, Patient/Family education, Self Care, Joint mobilization, Joint manipulation, Stair training, Aquatic Therapy, Dry Needling, Electrical stimulation, Spinal manipulation, Spinal mobilization, Cryotherapy, Moist heat, Taping, Traction, Ultrasound, Fluidotherapy, Ionotophoresis 4mg /ml Dexamethasone, Manual therapy, and Re-evaluation.  PLAN FOR NEXT SESSION: TPDN  Durwin Reges DPT Durwin Reges, PT 07/26/2022, 11:25 AM

## 2022-07-29 ENCOUNTER — Encounter: Payer: 59 | Admitting: Physical Therapy

## 2022-08-02 ENCOUNTER — Ambulatory Visit: Payer: 59 | Attending: Physical Medicine & Rehabilitation | Admitting: Physical Therapy

## 2022-08-02 ENCOUNTER — Encounter: Payer: Self-pay | Admitting: Physical Therapy

## 2022-08-02 DIAGNOSIS — M5459 Other low back pain: Secondary | ICD-10-CM | POA: Diagnosis not present

## 2022-08-02 NOTE — Therapy (Signed)
OUTPATIENT PHYSICAL THERAPY THORACOLUMBAR EVALUATION   Patient Name: Theresa Ortiz MRN: [id number] DOB:01-05-1991, 32 y.o., female Today's Date: 08/02/2022  END OF SESSION:  PT End of Session - 08/02/22 1052     Visit Number 4    Number of Visits 17    Date for PT Re-Evaluation 09/27/22    Authorization - Visit Number 4    Authorization - Number of Visits 10    Progress Note Due on Visit 10    PT Start Time 1050    PT Stop Time 1128    PT Time Calculation (min) 38 min    Activity Tolerance Patient tolerated treatment well    Behavior During Therapy [facility] for tasks assessed/performed               Past Medical History:  Diagnosis Date   Acute meniscal tear of knee    left knee   Allergy    Anxiety    Asthma    Atrial fibrillation    slight   COVID-19 04/2020   Depression    Ovarian cyst    PVC (premature ventricular contraction)    Scoliosis    Shingles    Tachycardia    Past Surgical History:  Procedure Laterality Date   KNEE ARTHROSCOPY WITH LATERAL MENISECTOMY Left 09/01/2012   Procedure: LEFT KNEE ARTHROSCOPY WITH LATERAL MENISCECTOMY ;  Surgeon: Jacki Cones, MD;  Location: [facility];  Service: Orthopedics;  Laterality: Left;   MENISCUS REPAIR Right    RIGHT INDEX  FINGER TENDON REPAIR  NOV 2013   Patient Active Problem List   Diagnosis Date Noted   Palpitation 07/13/2018   Atypical chest pain 07/13/2018   Family history of heart disease 07/13/2018   Shortness of breath 07/13/2018   Sinus tachycardia 07/13/2018   Acute lateral meniscus tear of left knee 09/01/2012    PCP: Tisovec MD  REFERRING PROVIDER: Tisovec MD  REFERRING DIAG: LBP with sciatica  Rationale for Evaluation and Treatment: Rehabilitation  THERAPY DIAG:  Other low back pain  ONSET DATE: Feb 2023  SUBJECTIVE:                                                                                                                                                                                            SUBJECTIVE STATEMENT: Pt reports she had some increased nerve symptoms Wed after hitting her back on a table, that has now subsided. 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 Pt reports she has some pain at the mid calf that is the size of packing tape that feels like a cramp. She reports she has tension at L low back and glute. LBP/glute pain aggravated by lifting/moving patients, and bending forward. Has had L sided sciatic symptoms 1x/week when lifting something heavy down post LLE. She hs previously had pain with prolonged walking, but that has subsided over the past year or so. Has had successful PT in the past with [facility], which she thinks is very helpful. Pt works full time as an Engineer, structuralXray tech, enjoys traveling and walking her dog. Current pain 1/10, worst pain 4/10; best 1/10. Reports wearing crocs work shoes for work mostly, she had a fitting at [employer] but felt like those shoes pushed her arch too much. No falls in past 6 months.  denies N/V, B&B changes, unexplained weight fluctuation, saddle paresthesia, fever, night sweats, or unrelenting night pain at this time.   PAIN:  Are you having pain? Yes: NPRS scale: 1/10 Pain location: L sided LBP/superior glute; calf cramp Pain description: tension, ache, "nerve" pain at L glute  Aggravating factors: lifting, bending forward, prolonged walking Relieving factors: rest  PRECAUTIONS: None  WEIGHT BEARING RESTRICTIONS: No  FALLS:  Has patient fallen in last 6 months? No  LIVING ENVIRONMENT: Lives with: lives alone Lives in: House/apartment Stairs: No Has following equipment at home:  none  OCCUPATION: [employer] tech  PLOF: Independent  PATIENT GOALS: Get the cramp out of my calf  NEXT MD VISIT: 6  weeks  OBJECTIVE:   DIAGNOSTIC FINDINGS:  MRI  "I have buldging discs at L4-5 S1; T 10-12"  PATIENT SURVEYS:  FOTO 53 goal of 66  SCREENING FOR RED FLAGS: Bowel or bladder incontinence: No Spinal tumors: No Cauda equina syndrome: No Compression fracture: No Abdominal aneurysm: No  COGNITION: Overall cognitive status: Within functional limits for tasks assessed     SENSATION: WFL  MUSCLE LENGTH: Hamstrings: full length but with tension at end range bilat   POSTURE: rounded shoulders, forward head, increased lumbar lordosis, and increased thoracic kyphosis standing hanging on Y ligaments  PALPATION: TTP with concordant pain sign to palpation of superior L glute max; and with deep palpation over mid glute max/deep rotators. Secondary pain to L lumbar paraspinals, QL at pelvic attachment with latent trigger points  LUMBAR ROM:   AROM eval  Flexion WNL  Extension WNL  Right lateral flexion WNL  Left lateral flexion WNL  Right rotation WNL  Left rotation WNL   (Blank rows = not tested)  LOWER EXTREMITY MMT:     MMT  Right eval Left eval  Hip flexion 5 5  Hip extension 5 4+  Hip abduction 5 5  Hip adduction 5 5  Hip internal rotation 5 5  Hip external rotation 5 4  Knee flexion    Knee extension    Ankle dorsiflexion    Ankle plantarflexion    Ankle inversion    Ankle eversion     (Blank rows = not tested)  LOWER EXTREMITY ROM:    All mobility WNL- gross hypermobility  LUMBAR SPECIAL TESTS:  Straight leg raise test: Negative, Slump test: Negative, Single leg stance test: Negative, FABER test: Negative, and Thomas test: Negative    FUNCTIONAL TESTS:  Beighton scale 7/9 Lacking knee hypermobility- has had "meniscus surgery in both"   Plank test [employer]10sec Bering Sorenson 20sec GAIT: Distance walked: [employer]60M  Assistive device utilized: None Level of assistance: Complete Independence Comments: normal walk speed, no gross deficits  TODAY'S TREATMENT:                                                                                                                               DATE: 08/02/22 Ther-Ex Nustep seat 9 UE 12 L3 5mins for gentle mobility and strengthening SKTC in hooklying x10sec bilat ; with CLLE ext x10 bilat Lower trunk rotation hooklying x20 with min cuing for spine contact with mat Forward theraball rolls x12 with cuing for breath control with good carry over Lateral theraball roll in sitting to R (back of hand on ball) x12 with cuing for breath control with good carry over  Manual Manual lumbar traction with theraball 10sec on/off x12 Maintaed above traction with alt rotation x20  STM with trigger point release to glute max at iliac crest insertion, over deep rotators and L lumbar QL/paraspinals Following Dry Needling: (1/2/1) 75/100/17600mm .30 needles placed along the L lumbar parapsinals/L glute max/L piriformis to decrease increased muscular spasms and trigger points with the patient positioned in prone. Patient was educated on risks and benefits of therapy and verbally consents to PT.      PATIENT EDUCATION:  Education details: Patient was educated on diagnosis, anatomy and pathology involved, prognosis, role of PT, and was given an HEP, demonstrating exercise with proper form following verbal and tactile cues, and was given a paper hand out to continue exercise at home. Pt was educated on and agreed to plan of care.  Person educated: Patient Education method: Explanation, Demonstration, and Handouts Education comprehension: verbalized understanding and returned demonstration  HOME EXERCISE PROGRAM: [id number]  ASSESSMENT:  CLINICAL IMPRESSION: PT utilized mobility therex and manual with TPDN techniques to reduce glute max/deep rotator and lumbar paraspinal tension and pain with success. PT with more analgesic focus of therex > needed stability focus d/t increased pain report from patient. Pt with minimal pain EOS. Patient  is able to comply with all ciung for proper technique of therex with good motivation throughout session. Patient is able to progress through functional movement positions demonstrating neutral posture carry over in these. PT will continue progression as able.    OBJECTIVE IMPAIRMENTS: Abnormal gait, decreased activity tolerance, decreased balance, decreased coordination, decreased endurance, decreased mobility, difficulty walking, decreased ROM, decreased strength, increased fascial restrictions, impaired flexibility, improper body mechanics, postural dysfunction, and pain.   ACTIVITY LIMITATIONS: carrying, lifting, standing, squatting, stairs, transfers, and hygiene/grooming  PARTICIPATION LIMITATIONS: meal prep, cleaning, driving, shopping, community activity, occupation, and yard work  PERSONAL FACTORS: Fitness, Past/current experiences, Time since onset of injury/illness/exacerbation, and 1-2 comorbidities: A&D chronic LBP  are also affecting patient's functional outcome.   REHAB POTENTIAL: Good  CLINICAL DECISION MAKING: Evolving/moderate complexity  EVALUATION COMPLEXITY: Moderate   GOALS: Goals reviewed with patient? Yes  SHORT TERM GOALS: Target date: 08/07/22  Pt will be independent with HEP in order to improve strength  and balance in order to decrease fall risk and improve function at home and work.  Baseline:HEP given  Goal status: INITIAL   LONG TERM GOALS: Target date: 08/26/22  Patient will increase FOTO score to 63 to demonstrate predicted increase in functional mobility to complete ADLs  Baseline: 55 Goal status: INITIAL  2.  Pt will decrease worst pain as reported on NPRS by at least 3 points in order to demonstrate clinically significant reduction in pain.  Baseline: 4/10 Goal status: INITIAL  3.  Pt will demonstrate plank time of or more to demonstrate age-predicted match of core strength needed for job duties Baseline: 10sec Goal status: INITIAL  4.   Pt will demonstrate bering sorenson test of 34sec or more to demonstrate clinically significant increase in spinal extensor strength needed for job duties Baseline: 20sec Goal status: INITIAL    PLAN:  PT FREQUENCY: 1-2x/week  PT DURATION: 8 weeks  PLANNED INTERVENTIONS: Therapeutic exercises, Therapeutic activity, Neuromuscular re-education, Balance training, Gait training, Patient/Family education, Self Care, Joint mobilization, Joint manipulation, Stair training, Aquatic Therapy, Dry Needling, Electrical stimulation, Spinal manipulation, Spinal mobilization, Cryotherapy, Moist heat, Taping, Traction, Ultrasound, Fluidotherapy, Ionotophoresis 4mg /ml Dexamethasone, Manual therapy, and Re-evaluation.  PLAN FOR NEXT SESSION: TPDN  Hilda Lias DPT Hilda Lias, PT 08/02/2022, 11:27 AM

## 2022-08-05 ENCOUNTER — Ambulatory Visit: Payer: 59 | Admitting: Physical Therapy

## 2022-08-09 ENCOUNTER — Encounter: Payer: 59 | Admitting: Physical Therapy

## 2022-08-13 ENCOUNTER — Other Ambulatory Visit: Payer: Self-pay

## 2022-08-13 MED ORDER — WEGOVY 1 MG/0.5ML ~~LOC~~ SOAJ
1.0000 mg | SUBCUTANEOUS | 0 refills | Status: DC
  Filled 2022-08-13: qty 2, 28d supply, fill #0

## 2022-08-16 ENCOUNTER — Ambulatory Visit: Payer: 59

## 2022-08-16 DIAGNOSIS — M5459 Other low back pain: Secondary | ICD-10-CM

## 2022-08-16 NOTE — Therapy (Signed)
OUTPATIENT PHYSICAL THERAPY THORACOLUMBAR TREATMENT   Patient Name: Theresa Ortiz MRN: [id number] DOB:01-07-1991, 32 y.o., female Today's Date: 08/16/2022  END OF SESSION:  PT End of Session - 08/16/22 0749     Visit Number 5    Number of Visits 17    Date for PT Re-Evaluation 09/27/22    Authorization - Visit Number 5    Authorization - Number of Visits 10    Progress Note Due on Visit 10    PT Start Time 0749    PT Stop Time 0836    PT Time Calculation (min) 47 min    Activity Tolerance Patient tolerated treatment well    Behavior During Therapy [facility] for tasks assessed/performed                Past Medical History:  Diagnosis Date   Acute meniscal tear of knee    left knee   Allergy    Anxiety    Asthma    Atrial fibrillation    slight   COVID-19 04/2020   Depression    Ovarian cyst    PVC (premature ventricular contraction)    Scoliosis    Shingles    Tachycardia    Past Surgical History:  Procedure Laterality Date   KNEE ARTHROSCOPY WITH LATERAL MENISECTOMY Left 09/01/2012   Procedure: LEFT KNEE ARTHROSCOPY WITH LATERAL MENISCECTOMY ;  Surgeon: Jacki Cones, MD;  Location: [facility] [place];  Service: Orthopedics;  Laterality: Left;   MENISCUS REPAIR Right    RIGHT INDEX  FINGER TENDON REPAIR  NOV 2013   Patient Active Problem List   Diagnosis Date Noted   Palpitation 07/13/2018   Atypical chest pain 07/13/2018   Family history of heart disease 07/13/2018   Shortness of breath 07/13/2018   Sinus tachycardia 07/13/2018   Acute lateral meniscus tear of left knee 09/01/2012    PCP: Tisovec MD  REFERRING PROVIDER: Tisovec MD  REFERRING DIAG: LBP with sciatica  Rationale for Evaluation and Treatment: Rehabilitation  THERAPY DIAG:  Other low back pain  ONSET DATE: Feb 2023  SUBJECTIVE:                                                                                                                                                                                            SUBJECTIVE STATEMENT:  Pt reports she is doing well and has noted some residual calf pain that she believes is still coming from her lumbar/sciatic complications.   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 Pt reports she has some pain at the mid calf that is the size of packing tape that feels like a cramp. She reports she has tension at L low back and glute. LBP/glute pain aggravated by lifting/moving patients, and bending forward. Has had L sided sciatic symptoms 1x/week when lifting something heavy down post LLE. She hs previously had pain with prolonged walking, but that has subsided over the past year or so. Has had successful PT in the past with [facility], which she thinks is very helpful. Pt works full time as an Engineer, structural, enjoys traveling and walking her dog. Current pain 1/10, worst pain 4/10; best 1/10. Reports wearing crocs work shoes for work mostly, she had a fitting at [employer] but felt like those shoes pushed her arch too much. No falls in past 6 months.  denies N/V, B&B changes, unexplained weight fluctuation, saddle paresthesia, fever, night sweats, or unrelenting night pain at this time.   PAIN:  Are you having pain? Yes: NPRS scale: 1/10 Pain location: L sided LBP/superior glute; calf cramp Pain description: tension, ache, "nerve" pain at L glute  Aggravating factors: lifting, bending forward, prolonged walking Relieving factors: rest  PRECAUTIONS: None  WEIGHT BEARING RESTRICTIONS: No  FALLS:  Has patient fallen in last 6 months? No  LIVING ENVIRONMENT: Lives with: lives alone Lives in: House/apartment Stairs: No Has following equipment at home:  none  OCCUPATION: [employer] tech  PLOF: Independent  PATIENT GOALS: Get the cramp out of my calf  NEXT MD VISIT: 6 weeks  OBJECTIVE:   DIAGNOSTIC FINDINGS:  MRI  "I have  buldging discs at L4-5 S1; T 10-12"  PATIENT SURVEYS:  FOTO 53 goal of 66  SCREENING FOR RED FLAGS: Bowel or bladder incontinence: No Spinal tumors: No Cauda equina syndrome: No Compression fracture: No Abdominal aneurysm: No  COGNITION: Overall cognitive status: Within functional limits for tasks assessed     SENSATION: WFL  MUSCLE LENGTH: Hamstrings: full length but with tension at end range bilat   POSTURE: rounded shoulders, forward head, increased lumbar lordosis, and increased thoracic kyphosis standing hanging on Y ligaments  PALPATION: TTP with concordant pain sign to palpation of superior L glute max; and with deep palpation over mid glute max/deep rotators. Secondary pain to L lumbar paraspinals, QL at pelvic attachment with latent trigger points  LUMBAR ROM:   AROM eval  Flexion WNL  Extension WNL  Right lateral flexion WNL  Left lateral flexion WNL  Right rotation WNL  Left rotation WNL   (Blank rows = not tested)  LOWER EXTREMITY MMT:     MMT  Right eval Left eval  Hip flexion 5 5  Hip extension 5 4+  Hip abduction 5 5  Hip adduction 5 5  Hip internal rotation 5 5  Hip external rotation 5 4  Knee flexion    Knee extension    Ankle dorsiflexion    Ankle plantarflexion    Ankle inversion    Ankle eversion     (Blank rows = not tested)  LOWER EXTREMITY ROM:    All mobility WNL- gross hypermobility  LUMBAR SPECIAL TESTS:  Straight leg raise test: Negative, Slump test: Negative, Single leg stance test: Negative, FABER test: Negative, and Thomas test: Negative    FUNCTIONAL TESTS:  Beighton scale 7/9 Lacking knee hypermobility- has had "meniscus surgery in both"   Plank test [employer] 20sec GAIT: Distance walked: [employer] device utilized: None Level of assistance: Complete  Independence Comments: normal walk speed, no gross deficits  TODAY'S TREATMENT: DATE: 08/16/22   Ther-Ex  Nustep seat 9 UE 12 L3 5 mins  for gentle mobility and strengthening SKTC in hooklying x10sec bilat ; with CLLE ext x10 bilat Lower trunk rotation hooklying with red red physioball under LE's, x20 with min cuing for spine contact with mat Forward theraball rolls in sitting x12 with cuing for breath control with good carry over Lateral theraball roll in sitting to R (back of hand on ball) x12 with cuing for breath control with good carry over  Manual   STM with trigger point release to glute max at iliac crest insertion, over deep rotators and L lumbar QL/paraspinals STM with TP release to L gluteal region while performing passive IR/ER of the L hip, several minutes spent on technique   Trigger Point Dry Needling (TDN), unbilled, 6 min  Education performed with patient regarding potential benefit of TDN. Reviewed precautions and risks with patient. Reviewed special precautions/risks over lung fields which include pneumothorax. Reviewed signs and symptoms of pneumothorax and advised pt to go to ER immediately if these symptoms develop advise them of dry needling treatment. Extensive time spent with pt to ensure full understanding of TDN risks. Pt provided verbal consent to treatment. TDN performed to medial and lateral gastrocs, and soleus of the L LE with 0.3 x 60 single needle placements with local twitch response (LTR). Pistoning technique utilized. Improved pain-free motion following intervention.       PATIENT EDUCATION:  Education details: Patient was educated on diagnosis, anatomy and pathology involved, prognosis, role of PT, and was given an HEP, demonstrating exercise with proper form following verbal and tactile cues, and was given a paper hand out to continue exercise at home. Pt was educated on and agreed to plan of care.  Person educated: Patient Education method: Explanation, Demonstration, and Handouts Education comprehension: verbalized understanding and returned demonstration  HOME EXERCISE  PROGRAM: [id number]  ASSESSMENT:  CLINICAL IMPRESSION:  Pt responded well to the manual therapy approach and the exercises given today.  Pt puts forth great effort throughout the treatment session and noted to have reduced pain at the conclusion of the session.  Pt has significant trigger points in the medial and lateral gastrocs of the L LE as well as the soleus.  Pt advised to drink plenty of water to assist with the soreness expected following DN.  Pt noted to be compliant with instruction and also noted that the manual therapy approach with passive IR/ER targeted the area of concern that she has been experiencing.   Pt will continue to benefit from skilled therapy to address remaining deficits in order to improve overall QoL and return to PLOF.      OBJECTIVE IMPAIRMENTS: Abnormal gait, decreased activity tolerance, decreased balance, decreased coordination, decreased endurance, decreased mobility, difficulty walking, decreased ROM, decreased strength, increased fascial restrictions, impaired flexibility, improper body mechanics, postural dysfunction, and pain.   ACTIVITY LIMITATIONS: carrying, lifting, standing, squatting, stairs, transfers, and hygiene/grooming  PARTICIPATION LIMITATIONS: meal prep, cleaning, driving, shopping, community activity, occupation, and yard work  PERSONAL FACTORS: Fitness, Past/current experiences, Time since onset of injury/illness/exacerbation, and 1-2 comorbidities: A&D chronic LBP  are also affecting patient's functional outcome.   REHAB POTENTIAL: Good  CLINICAL DECISION MAKING: Evolving/moderate complexity  EVALUATION COMPLEXITY: Moderate   GOALS: Goals reviewed with patient? Yes  SHORT TERM GOALS: Target date: 08/07/22  Pt will be independent with HEP in order to improve strength and balance in  order to decrease fall risk and improve function at home and work.  Baseline:HEP given  Goal status: INITIAL   LONG TERM GOALS: Target date:  08/26/22  Patient will increase FOTO score to 63 to demonstrate predicted increase in functional mobility to complete ADLs  Baseline: 55 Goal status: INITIAL  2.  Pt will decrease worst pain as reported on NPRS by at least 3 points in order to demonstrate clinically significant reduction in pain.  Baseline: 4/10 Goal status: INITIAL  3.  Pt will demonstrate plank time of or more to demonstrate age-predicted match of core strength needed for job duties Baseline: 10sec Goal status: INITIAL  4.  Pt will demonstrate bering sorenson test of 34sec or more to demonstrate clinically significant increase in spinal extensor strength needed for job duties Baseline: 20sec Goal status: INITIAL    PLAN:  PT FREQUENCY: 1-2x/week  PT DURATION: 8 weeks  PLANNED INTERVENTIONS: Therapeutic exercises, Therapeutic activity, Neuromuscular re-education, Balance training, Gait training, Patient/Family education, Self Care, Joint mobilization, Joint manipulation, Stair training, Aquatic Therapy, Dry Needling, Electrical stimulation, Spinal manipulation, Spinal mobilization, Cryotherapy, Moist heat, Taping, Traction, Ultrasound, Fluidotherapy, Ionotophoresis 4mg /ml Dexamethasone, Manual therapy, and Re-evaluation.  PLAN FOR NEXT SESSION:  TPDN   Nolon Bussing, PT, DPT Physical Therapist - [facility]  08/16/22, 11:28 AM

## 2022-08-19 ENCOUNTER — Other Ambulatory Visit: Payer: Self-pay

## 2022-08-30 ENCOUNTER — Other Ambulatory Visit: Payer: Self-pay

## 2022-08-30 DIAGNOSIS — R051 Acute cough: Secondary | ICD-10-CM | POA: Diagnosis not present

## 2022-08-30 DIAGNOSIS — J40 Bronchitis, not specified as acute or chronic: Secondary | ICD-10-CM | POA: Diagnosis not present

## 2022-08-30 DIAGNOSIS — J189 Pneumonia, unspecified organism: Secondary | ICD-10-CM | POA: Diagnosis not present

## 2022-08-30 DIAGNOSIS — J4530 Mild persistent asthma, uncomplicated: Secondary | ICD-10-CM | POA: Diagnosis not present

## 2022-08-30 DIAGNOSIS — F17200 Nicotine dependence, unspecified, uncomplicated: Secondary | ICD-10-CM | POA: Diagnosis not present

## 2022-08-30 MED ORDER — PREDNISONE 10 MG PO TABS
ORAL_TABLET | ORAL | 0 refills | Status: AC
  Filled 2022-08-30: qty 21, 6d supply, fill #0

## 2022-08-30 MED ORDER — ALBUTEROL SULFATE HFA 108 (90 BASE) MCG/ACT IN AERS
2.0000 | INHALATION_SPRAY | RESPIRATORY_TRACT | 3 refills | Status: AC | PRN
  Filled 2022-08-30: qty 6.7, 30d supply, fill #0
  Filled 2022-10-09: qty 6.7, 30d supply, fill #1
  Filled 2022-12-16: qty 6.7, 30d supply, fill #2

## 2022-08-30 MED ORDER — AZITHROMYCIN 250 MG PO TABS
ORAL_TABLET | ORAL | 0 refills | Status: AC
  Filled 2022-08-30: qty 6, 5d supply, fill #0

## 2022-09-03 ENCOUNTER — Other Ambulatory Visit: Payer: Self-pay

## 2022-09-03 MED ORDER — CEFDINIR 300 MG PO CAPS
300.0000 mg | ORAL_CAPSULE | Freq: Two times a day (BID) | ORAL | 0 refills | Status: DC
  Filled 2022-09-03: qty 20, 10d supply, fill #0

## 2022-09-04 ENCOUNTER — Other Ambulatory Visit: Payer: Self-pay

## 2022-09-11 ENCOUNTER — Other Ambulatory Visit (HOSPITAL_BASED_OUTPATIENT_CLINIC_OR_DEPARTMENT_OTHER): Payer: Self-pay | Admitting: Adult Health

## 2022-09-11 ENCOUNTER — Ambulatory Visit (HOSPITAL_BASED_OUTPATIENT_CLINIC_OR_DEPARTMENT_OTHER): Payer: 59

## 2022-09-11 ENCOUNTER — Other Ambulatory Visit: Payer: Self-pay

## 2022-09-11 ENCOUNTER — Ambulatory Visit (HOSPITAL_BASED_OUTPATIENT_CLINIC_OR_DEPARTMENT_OTHER)
Admission: RE | Admit: 2022-09-11 | Discharge: 2022-09-11 | Disposition: A | Discharge status: Home / Self Care | Payer: 59 | Source: Ambulatory Visit | Attending: Adult Health | Admitting: Adult Health

## 2022-09-11 DIAGNOSIS — R1011 Right upper quadrant pain: Secondary | ICD-10-CM | POA: Diagnosis not present

## 2022-09-11 DIAGNOSIS — D72829 Elevated white blood cell count, unspecified: Secondary | ICD-10-CM | POA: Diagnosis not present

## 2022-09-11 DIAGNOSIS — K219 Gastro-esophageal reflux disease without esophagitis: Secondary | ICD-10-CM | POA: Diagnosis not present

## 2022-09-11 DIAGNOSIS — R109 Unspecified abdominal pain: Secondary | ICD-10-CM | POA: Diagnosis not present

## 2022-09-11 MED ORDER — IOHEXOL 300 MG/ML  SOLN
100.0000 mL | Freq: Once | INTRAMUSCULAR | Status: AC | PRN
  Administered 2022-09-11: 100 mL via INTRAVENOUS

## 2022-09-11 MED ORDER — PANTOPRAZOLE SODIUM 40 MG PO TBEC
40.0000 mg | DELAYED_RELEASE_TABLET | Freq: Every day | ORAL | 3 refills | Status: DC
  Filled 2022-09-11: qty 90, 90d supply, fill #0

## 2022-09-11 MED ORDER — FAMOTIDINE 20 MG PO TABS
20.0000 mg | ORAL_TABLET | Freq: Two times a day (BID) | ORAL | 5 refills | Status: DC | PRN
  Filled 2022-09-11: qty 60, 30d supply, fill #0
  Filled 2022-11-04: qty 60, 30d supply, fill #1
  Filled 2022-12-16: qty 60, 30d supply, fill #2
  Filled 2023-02-05: qty 60, 30d supply, fill #3
  Filled 2023-03-18: qty 60, 30d supply, fill #4
  Filled 2023-05-01: qty 60, 30d supply, fill #5

## 2022-09-12 ENCOUNTER — Encounter (HOSPITAL_BASED_OUTPATIENT_CLINIC_OR_DEPARTMENT_OTHER): Payer: Self-pay | Admitting: Adult Health

## 2022-09-12 ENCOUNTER — Other Ambulatory Visit (HOSPITAL_BASED_OUTPATIENT_CLINIC_OR_DEPARTMENT_OTHER): Payer: Self-pay | Admitting: Adult Health

## 2022-09-12 ENCOUNTER — Other Ambulatory Visit: Payer: Self-pay | Admitting: Internal Medicine

## 2022-09-12 DIAGNOSIS — R1011 Right upper quadrant pain: Secondary | ICD-10-CM

## 2022-09-12 DIAGNOSIS — R109 Unspecified abdominal pain: Secondary | ICD-10-CM | POA: Diagnosis not present

## 2022-09-13 ENCOUNTER — Ambulatory Visit: Admission: RE | Admit: 2022-09-13 | Payer: 59 | Source: Ambulatory Visit

## 2022-09-13 ENCOUNTER — Ambulatory Visit
Admission: RE | Admit: 2022-09-13 | Discharge: 2022-09-13 | Disposition: A | Discharge status: Home / Self Care | Payer: 59 | Source: Ambulatory Visit | Attending: Internal Medicine | Admitting: Internal Medicine

## 2022-09-13 ENCOUNTER — Other Ambulatory Visit: Payer: Self-pay

## 2022-09-13 DIAGNOSIS — K8020 Calculus of gallbladder without cholecystitis without obstruction: Secondary | ICD-10-CM | POA: Diagnosis not present

## 2022-09-13 DIAGNOSIS — R1011 Right upper quadrant pain: Secondary | ICD-10-CM | POA: Diagnosis not present

## 2022-09-18 ENCOUNTER — Encounter: Payer: Self-pay | Admitting: Gastroenterology

## 2022-09-18 ENCOUNTER — Other Ambulatory Visit
Admission: RE | Admit: 2022-09-18 | Discharge: 2022-09-18 | Disposition: A | Discharge status: Home / Self Care | Payer: 59 | Source: Ambulatory Visit | Attending: Gastroenterology | Admitting: Gastroenterology

## 2022-09-18 ENCOUNTER — Other Ambulatory Visit: Payer: Self-pay

## 2022-09-18 DIAGNOSIS — R109 Unspecified abdominal pain: Secondary | ICD-10-CM

## 2022-09-18 DIAGNOSIS — R748 Abnormal levels of other serum enzymes: Secondary | ICD-10-CM

## 2022-09-18 LAB — HEPATIC FUNCTION PANEL
ALT: 113 U/L — ABNORMAL HIGH (ref 0–44)
AST: 105 U/L — ABNORMAL HIGH (ref 15–41)
Albumin: 4 g/dL (ref 3.5–5.0)
Alkaline Phosphatase: 102 U/L (ref 38–126)
Bilirubin, Direct: <0.1 mg/dL (ref 0.0–0.2)
Total Bilirubin: 0.6 mg/dL (ref 0.3–1.2)
Total Protein: 7.4 g/dL (ref 6.5–8.1)

## 2022-09-18 LAB — LIPASE, BLOOD: Lipase: 27 U/L (ref 11–51)

## 2022-09-19 NOTE — Addendum Note (Signed)
Addended by: Roena Malady on: 09/19/2022 10:50 AM   Modules accepted: Orders

## 2022-09-27 ENCOUNTER — Other Ambulatory Visit: Payer: Self-pay | Admitting: Internal Medicine

## 2022-09-27 DIAGNOSIS — J189 Pneumonia, unspecified organism: Secondary | ICD-10-CM

## 2022-09-30 ENCOUNTER — Other Ambulatory Visit: Payer: Self-pay

## 2022-10-01 ENCOUNTER — Ambulatory Visit
Admission: RE | Admit: 2022-10-01 | Discharge: 2022-10-01 | Disposition: A | Discharge status: Home / Self Care | Payer: 59 | Source: Ambulatory Visit | Attending: Internal Medicine | Admitting: Internal Medicine

## 2022-10-01 ENCOUNTER — Ambulatory Visit
Admission: RE | Admit: 2022-10-01 | Discharge: 2022-10-01 | Disposition: A | Discharge status: Home / Self Care | Payer: 59 | Attending: Internal Medicine | Admitting: Internal Medicine

## 2022-10-01 DIAGNOSIS — J189 Pneumonia, unspecified organism: Secondary | ICD-10-CM | POA: Diagnosis not present

## 2022-10-10 ENCOUNTER — Other Ambulatory Visit: Payer: Self-pay

## 2022-10-10 ENCOUNTER — Other Ambulatory Visit
Admission: RE | Admit: 2022-10-10 | Discharge: 2022-10-10 | Disposition: A | Discharge status: Home / Self Care | Payer: 59 | Source: Ambulatory Visit | Attending: Gastroenterology | Admitting: Gastroenterology

## 2022-10-10 DIAGNOSIS — J189 Pneumonia, unspecified organism: Secondary | ICD-10-CM | POA: Insufficient documentation

## 2022-10-10 LAB — HEPATIC FUNCTION PANEL
ALT: 33 U/L (ref 0–44)
AST: 25 U/L (ref 15–41)
Albumin: 4.2 g/dL (ref 3.5–5.0)
Alkaline Phosphatase: 89 U/L (ref 38–126)
Bilirubin, Direct: <0.1 mg/dL (ref 0.0–0.2)
Total Bilirubin: 0.5 mg/dL (ref 0.3–1.2)
Total Protein: 7.4 g/dL (ref 6.5–8.1)

## 2022-10-10 NOTE — Addendum Note (Signed)
Addended by: Roena Malady on: 10/10/2022 10:44 AM   Modules accepted: Orders

## 2022-11-04 ENCOUNTER — Other Ambulatory Visit: Payer: Self-pay

## 2022-11-04 MED ORDER — AMPHETAMINE-DEXTROAMPHET ER 20 MG PO CP24
20.0000 mg | ORAL_CAPSULE | Freq: Every day | ORAL | 0 refills | Status: DC
  Filled 2022-11-04: qty 30, 30d supply, fill #0

## 2022-11-19 ENCOUNTER — Encounter: Payer: Self-pay | Admitting: Physical Therapy

## 2022-11-19 ENCOUNTER — Ambulatory Visit: Payer: 59 | Attending: Physical Medicine & Rehabilitation | Admitting: Physical Therapy

## 2022-11-19 DIAGNOSIS — M62838 Other muscle spasm: Secondary | ICD-10-CM | POA: Diagnosis not present

## 2022-11-19 DIAGNOSIS — M5459 Other low back pain: Secondary | ICD-10-CM | POA: Insufficient documentation

## 2022-11-19 DIAGNOSIS — M792 Neuralgia and neuritis, unspecified: Secondary | ICD-10-CM | POA: Diagnosis not present

## 2022-11-19 NOTE — Therapy (Unsigned)
OUTPATIENT PHYSICAL THERAPY RE-EVALUATION / TREATMENT Reason for re-evaluation: patient is returning to PT for same condition after more than 90 days since her last PT session, which is a significant change in her original POC and establishes a need to assess for change in condition and/or functional status.     Patient Name: Theresa Ortiz MRN: [id number] DOB:[birth year], 32 y.o., female Today's Date: 11/19/2022  END OF SESSION:  PT End of Session - 11/19/22 2000     Visit Number 6    Number of Visits 17    Date for PT Re-Evaluation 02/11/23    Authorization Type [place] [employer] reporting period from 07/01/2022    Authorization Time Period VL: 25 PT/OT combined, Auth req after 25th visit    Authorization - Visit Number 6    Authorization - Number of Visits 25    Progress Note Due on Visit 10    PT Start Time 1650   patient late   PT Stop Time 1735    PT Time Calculation (min) 45 min    Activity Tolerance Patient tolerated treatment well    Behavior During Therapy [facility] for tasks assessed/performed              Past Medical History:  Diagnosis Date   Acute meniscal tear of knee    left knee   Allergy    Anxiety    Asthma    Atrial fibrillation (HCC)    slight   COVID-19 04/2020   Depression    Ovarian cyst    PVC (premature ventricular contraction)    Scoliosis    Shingles    Tachycardia    Past Surgical History:  Procedure Laterality Date   KNEE ARTHROSCOPY WITH LATERAL MENISECTOMY Left 09/01/2012   Procedure: LEFT KNEE ARTHROSCOPY WITH LATERAL MENISCECTOMY ;  Surgeon: Jacki Cones, MD;  Location: [facility];  Service: Orthopedics;  Laterality: Left;   MENISCUS REPAIR Right    RIGHT INDEX  FINGER TENDON REPAIR  NOV 2013   Patient Active Problem List   Diagnosis Date Noted   Palpitation 07/13/2018   Atypical chest pain 07/13/2018   Family history of heart disease 07/13/2018   Shortness of breath 07/13/2018   Sinus tachycardia  07/13/2018   Acute lateral meniscus tear of left knee 09/01/2012    PCP: Tisovec, Adelfa Koh, MD  REFERRING PROVIDER: Filomena Jungling, MD  REFERRING DIAG: 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 Pt reports she has some pain at the mid calf that is the size of packing tape that feels like a cramp. She reports she has tension at L low back and glute. LBP/glute pain aggravated by lifting/moving patients, and bending forward. Has had L sided sciatic symptoms 1x/week when lifting something heavy down post LLE. She hs previously had pain with prolonged walking, but that has subsided over the past year or so. Has had successful PT in the past with [facility], which she thinks is very helpful. Pt works full time as an Engineer, structural, enjoys traveling and walking her dog. Comorbid conditions as listed above. No falls in past 6 months.  denies N/V,  B&B changes, unexplained weight fluctuation, saddle paresthesia, fever, night sweats, or unrelenting night pain at this time. Patient denies hx of cancer, stroke, seizures, diabetes, unexplained weight loss, unexplained changes in bowel or bladder problems, unexplained stumbling or dropping things, osteoporosis, and spinal surgery  SUBJECTIVE:                                                                                                                                                                                           SUBJECTIVE STATEMENT:  Patient reports that she is returning to PT after she fell off the schedule due to scheduling conflicts and unclear communication. When she called back to get on the schedule again  she was told she needed a new PT referral. She is now returning with an updated referral from Dr. Mariah Milling. She states her primary concerns is still her low back and legs. Since last PT session on 08/16/2022 her pain was fine until she was moved to a new work department where her desks are elevated and her chairs are really high so she almost has to jump to get into the chair.  Her left leg pain became continuous down the back of her thigh to her foot, which she had not had in over a year. She also felt numbness coming back in the left leg when she layed on her left side. Her left leg feels complete tired when she walks and she has to take extra effort to advance her left left. She has not been doing her HEP.   Patient states that after she had dry needling to her left calf at her last PT session the bottom of her left foot felt more achy and stiff. She does have a history of plantar fasciitis and it could have been a correlation, not causation, but it did start at the same time.   PAIN:  Are you having pain? Yes NPRS: Current: 3/10,  Best: 1/10, Worst: 10/10. Pain location: mid lower back and left thigh, with pins and needles to bottom of her left foot. Pain description: numbness and tingling, cold toes, annoying pain, weak feeling in her left leg when walking, feels tired . Aggravating factors: walking into work (up a hill), working 12 hour shifts, varies Relieving factors: elevating legs, ice, heating pads, dry needling  FUNCTIONAL LIMITATIONS: difficulty working, walking, prolonged standing, bending, lifting, yardwork, housework, sleeping, traveling, prolonged sitting, going to the beach, anything that requires repetitive bending or lifting.   LEISURE: go to the beach   PRECAUTIONS: None  WEIGHT BEARING RESTRICTIONS: No  FALLS:  Has patient fallen in last 6 months? No  LIVING ENVIRONMENT: Lives  with: lives alone Lives in: House/apartment Stairs: No Has following equipment at home:   none  OCCUPATION: [employer] tech, 4x9 hours a week  PLOF: Independent  PATIENT GOALS: Get the cramp out of my calf, to "eleviate whatever pissed it off this time out of the leg"   OBJECTIVE:   DIAGNOSTIC FINDINGS:  Lumbar MRI report from 11/24/2021:  CLINICAL DATA:  Initial evaluation for new onset cramping and pain to the left lower extremity, extending from hip to bottom of left foot for 1 month. History of work injury.   EXAM: MRI LUMBAR SPINE WITHOUT CONTRAST   TECHNIQUE: Multiplanar, multisequence MR imaging of the lumbar spine was performed. No intravenous contrast was administered.   COMPARISON:  Comparison made with previous MRI from 06/21/2021.   FINDINGS: Segmentation: Standard. Lowest well-formed disc space labeled the L5-S1 level.   Alignment: Physiologic with preservation of the normal lumbar lordosis. No listhesis.   Vertebrae: Vertebral body height maintained without acute or interval fracture. Bone marrow signal intensity within normal limits. Subcentimeter benign hemangioma noted within the T12 vertebral body. No worrisome osseous lesions. No abnormal marrow edema.   Conus medullaris and cauda equina: Conus extends to the L1 level. Conus and cauda equina appear normal.   Paraspinal and other soft tissues: Unremarkable.   Disc levels:   L1-2:  Unremarkable.   L2-3:  Unremarkable.   L3-4: Normal interspace. Minimal facet spurring on the right. No canal or foraminal stenosis.   L4-5: Degenerative intervertebral disc space narrowing with disc desiccation and mild disc bulge. Superimposed tiny left paracentral disc protrusion with annular fissure, stable. Mild bilateral facet hypertrophy. The left L4-5 facet appears somewhat dysplastic, stable. No canal or lateral recess stenosis. Mild left L4 foraminal narrowing. Right neural foramen remains patent. Appearance is stable.   L5-S1: Mild degenerative intervertebral disc space narrowing with disc  desiccation. Minimal endplate spurring with small degenerative endplate Schmorl's node deformity at the superior endplate of S1. Broad-based central disc protrusion mildly flattens the ventral epidural fat (series 8, image 32). No canal or lateral recess stenosis. Foramina remain patent. Appearance is stable.   IMPRESSION: 1. Overall, no significant interval change in appearance of the lumbar spine as compared to 06/21/2021. 2. Stable small left paracentral disc protrusion with annular fissure at L4-5, with resultant mild left L4 foraminal stenosis. 3. Shallow broad-based central disc protrusion at L5-S1 without significant stenosis or neural impingement.     Electronically Signed   By: Rise Mu M.D.   On: 11/24/2021 06:41  SENSATION: WFL  PALPATION: TTP with concordant pain sign at deep left hip rotators. Further palpation deferred to later date.    LOWER EXTREMITY MMT:     MMT  Right eval Left eval R/L 11/19/22  Hip flexion 5 5 5/4+*  Hip extension 5 4+   Hip abduction 5 5 4+/4  Hip adduction 5 5   Hip internal rotation 5 5   Hip external rotation 5 4 4+/4+  Knee flexion   5/5  Knee extension   5/5  Ankle dorsiflexion   5/4  Ankle plantarflexion   4+/4+  Ankle eversion   5/5  Great toe extension   4+/4+   (Blank rows = not tested) 11/19/2022: Able to toe and heel walk bilaterally, but feels her left foot is weaker with heel walking and has less clearance of the toes.   LUMBAR SPECIAL TESTS:  LOWER LIMB NEURODYNAMIC TESTS Straight Leg Raise (Sciatic nerve)  R  = strong stretch, negative  L  = strong stretch, negative  FUNCTIONAL TESTS:   Plank test [employer] 58sec   TODAY'S TREATMENT:  Manual therapy: to reduce pain and tissue tension, improve range of motion, neuromodulation, in order to promote improved ability to complete functional activities. PRONE  - STM to left glute and deep hip rotators.   Modality: Dry needling  performed to left glute and deep hip rotators to decrease pain and spasms along patient's left LE region with patient in prone utilizing 1 dry needle(s) .30mm x with 2 sticks at left piriformis region. Patient educated about the risks and benefits from therapy and verbally consents to treatment.  Dry needling performed by Luretha Murphy. [name] PT, DPT who is certified in this technique.   PATIENT EDUCATION:  Education details: Exercise purpose/form. Self management techniques. Education on diagnosis, prognosis, POC, anatomy and physiology of current condition. Education on dry needling.  Person educated: Patient Education method: Medical illustrator Education comprehension: verbalized understanding and returned demonstration  HOME EXERCISE PROGRAM: [id number]  ASSESSMENT:  CLINICAL IMPRESSION:  Patient is a 32 y.o. 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 Patient is returning to PT more than 90 days since last PT visit to continue care for the same condition as before. She completed 5 physical therapy sessions with improvement in symptoms before her attendance lapsed. She now presents with worsening left LE symptoms and low back and left glute pain since her work environment changed. She had good response to physical therapy intervention in the past and good potential for improvement again with consistent attendance. Patient did demonstrate improvement in trunk endurance at today's goal assessment. Patient presents with significant pain, paresthesia, muscle tension, motor control, muscle performance (strength/power/endurance), and activity tolerance impairments that are limiting ability to complete her usual activities such as working, walking, prolonged  standing, bending, lifting, yardwork, housework, sleeping, traveling, prolonged sitting, going to the beach, anything that requires repetitive bending or lifting without difficulty. Patient will benefit from skilled physical therapy intervention to address current body structure impairments and activity limitations to improve function and work towards goals set in current POC in order to return to prior level of function or maximal functional improvement.    OBJECTIVE IMPAIRMENTS: Abnormal gait, decreased activity tolerance, decreased balance, decreased endurance, decreased knowledge of condition, decreased mobility, difficulty walking, decreased ROM, decreased strength, increased fascial restrictions, impaired perceived functional ability, increased muscle spasms, impaired flexibility, improper body mechanics, postural dysfunction, obesity, and pain.   ACTIVITY LIMITATIONS: carrying, lifting, bending, sitting, standing, squatting, stairs, transfers, hygiene/grooming, locomotion level, and caring for others  PARTICIPATION LIMITATIONS: meal prep, cleaning, laundry, driving, shopping, community activity, occupation, yard work, and   working, walking, prolonged standing, bending, lifting, yardwork, housework, sleeping, traveling, prolonged sitting, going to [employer], anything that requires repetitive bending or lifting   PERSONAL FACTORS: Fitness, Past/current experiences, Time since onset of injury/illness/exacerbation, and 1-2 comorbidities: lateral meniscus tear of left knee; Palpitation; Atypical chest pain; Family history of heart disease; Shortness of breath; and Sinus tachycardia on their problem list. past medical history of Acute meniscal tear of knee, Allergy, Anxiety, Asthma, Atrial fibrillation (HCC), COVID-19 (04/2020), Depression, Ovarian cyst, PVC (premature ventricular contraction), Scoliosis, Shingles, and Tachycardia. past surgical history that includes RIGHT INDEX  FINGER TENDON REPAIR (NOV  2013); Knee arthroscopy with  lateral menisectomy (Left, 09/01/2012); and Meniscus repair (Right)  are also affecting patient's functional outcome.   REHAB POTENTIAL: Good  CLINICAL DECISION MAKING: Evolving/moderate complexity  EVALUATION COMPLEXITY: Moderate   GOALS: Goals reviewed with patient? Yes  SHORT TERM GOALS: Target date: 08/07/22. Target date updated to 12/04/2022 on 11/19/2022.   Pt will be independent with initial HEP in order to improve strength and balance in order to decrease fall risk and improve function at home and work.  Baseline:HEP given; patient not participating (11/19/2022);  Goal status: updated   LONG TERM GOALS: Target date: 08/26/22. Target date updated to 02/11/2023 for all unmet goals on 11/19/2022.   Patient will demonstrate improved FOTO by equal or greater than 10 by visit #17 to demonstrate improvement in overall condition and self-reported functional ability.   Baseline: initial baseline 55. That episode closed due to inactivity. New baseline to be established with new questionnaire to be recorded at visit 7 (11/20/2022);  Goal status: updated   2.  Pt will decrease worst pain as reported on NPRS by at least 3 points in order to demonstrate clinically significant reduction in pain.  Baseline: 4/10 (07/01/2022): up to 10/10  (11/19/2022);  Goal status: on-going  3.  Pt will demonstrate plank time of or more to demonstrate age-predicted match of core strength needed for job duties Baseline: 10sec (07/01/2022): 24 seconds (11/19/2022);  Goal status: In-progress  4.  Pt will demonstrate bering sorenson test of 34sec or more to demonstrate clinically significant increase in spinal extensor strength needed for job duties Baseline: 20sec (07/01/2022); 58 seconds (11/19/2022);  Goal status: MET  5.  Patient will be independent with initial home exercise program for self-management of symptoms. Baseline: HEP to be reviewed and updated as needed at visit 7 as  appropriate (11/19/22); Goal status: NEW    PLAN:  PT FREQUENCY: 1-2x/week  PT DURATION: 12 weeks  PLANNED INTERVENTIONS: Therapeutic exercises, Therapeutic activity, Neuromuscular re-education, Balance training, Gait training, Patient/Family education, Self Care, Joint mobilization, Stair training, Dry Needling, Electrical stimulation, Spinal mobilization, Cryotherapy, Moist heat, Manual therapy, and Re-evaluation.  PLAN FOR NEXT SESSION:  update HEP as appropriate, progressive core/LE/functional strengthening, consider repeated motions assessment, education. Manual therapy / dry needling as needed.   Luretha Murphy. [name], PT, DPT 11/19/22, 8:08 PM  [facility] [address] [place], [place] [address] P: [phone] I F: [phone]

## 2022-11-20 NOTE — Therapy (Unsigned)
OUTPATIENT PHYSICAL THERAPY TREATMENT NOTE   Patient Name: Theresa Ortiz MRN: [id number] DOB:06-26-1990, 32 y.o., female Today's Date: 11/21/2022  END OF SESSION:  PT End of Session - 11/21/22 1850     Visit Number 7    Number of Visits 17    Date for PT Re-Evaluation 02/11/23    Authorization Type [place] [employer] reporting period from 07/01/2022    Authorization Time Period VL: 25 PT/OT combined, Auth req after 25th visit    Authorization - Visit Number 7    Authorization - Number of Visits 25    Progress Note Due on Visit 10    PT Start Time 1650    PT Stop Time 1730    PT Time Calculation (min) 40 min    Activity Tolerance Patient limited by pain    Behavior During Therapy [facility] for tasks assessed/performed               Past Medical History:  Diagnosis Date   Acute meniscal tear of knee    left knee   Allergy    Anxiety    Asthma    Atrial fibrillation (HCC)    slight   COVID-19 04/2020   Depression    Ovarian cyst    PVC (premature ventricular contraction)    Scoliosis    Shingles    Tachycardia    Past Surgical History:  Procedure Laterality Date   KNEE ARTHROSCOPY WITH LATERAL MENISECTOMY Left 09/01/2012   Procedure: LEFT KNEE ARTHROSCOPY WITH LATERAL MENISCECTOMY ;  Surgeon: Jacki Cones, MD;  Location: 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 Pt reports she has some pain at the mid calf that is the size of packing tape that feels like a cramp. She reports she has tension at L low back and glute. LBP/glute pain aggravated by lifting/moving patients, and bending forward. Has had L sided sciatic symptoms 1x/week when lifting something heavy down post LLE. She hs previously had pain with prolonged walking, but that has subsided over the past year or so. Has had successful PT in the past with [facility], which she thinks is very helpful. Pt works full time as an Engineer, structural, enjoys traveling and walking her dog. Comorbid conditions as listed above. No falls in past 6 months.  denies N/V, B&B changes, unexplained weight fluctuation, saddle paresthesia, fever, night sweats, or unrelenting night pain at this time. Patient denies hx of cancer, stroke, seizures, diabetes, unexplained weight loss, unexplained changes in bowel or bladder problems, unexplained stumbling or dropping things, osteoporosis, and spinal surgery  SUBJECTIVE:  SUBJECTIVE STATEMENT: Patient states she just got off work and she "worked like a Nurse, mental health." She states since last PT session she has not had the symptoms below her knee, and has not felt as tight in her buttocks.   PAIN:  NPRS: 4/10 central low back, 2/10 back of left thigh  PATIENT GOALS: Get the cramp out of my calf, to "eleviate whatever pissed it off this time out of the leg"   OBJECTIVE  Concordant signs: left step up on 12 inch step and "getting out of the car" simulation.    REPEATED MOTIONS TESTING - Repeated lumbar extension over plinth, 2x10 (improved concordant sign and pain centralizing).  - prone press up 1x10 with lock and sag for 3 reps and with hands on yoga blocks for 2 reps. (Increasing shooting pain at left sacrum region, worse pain and sensation of her back being weak afterwards).   TODAY'S TREATMENT: Therapeutic exercise: to centralize symptoms and improve ROM, strength, muscular endurance, and activity tolerance required for successful completion of functional activities.  - Repeated lumbar extension over plinth, 2x10 (improved concordant sign and pain centralizing).  - prone press up 1x10 with lock and sag for 3 reps and with hands on yoga blocks for 2 reps. (Increasing shooting pain at left sacrum region, worse pain and sensation of her back being weak afterwards).  - education about disc theory in how repeated motions may work.   Manual therapy: to reduce pain and tissue tension, improve range of motion, neuromodulation, in order to promote improved ability to complete functional activities. PRONE  - STM to bilateral lumbar paraspinals, left glute, and left deep hip rotators.   Modality: Dry needling performed to bilateral lumbar multifidi at L4 and L5 level and left glute and deep hip rotators to decrease pain and spasms along patient's back and left LE region with patient in prone utilizing 5 dry needle(s) .30mm x 75/75/75/75/174mm with one stick at R and L lumbar multifidi at L4 and L5 levels and 3 sticks at left piriformis region. Patient educated about the risks and benefits from therapy and verbally consents to treatment.  Dry needling performed by Luretha Murphy. [name] PT, DPT who is certified in this technique.   PATIENT EDUCATION:  Education details: Exercise purpose/form. Self management techniques. Education on diagnosis, prognosis, POC, anatomy and physiology of current condition. Education on dry needling.  Person educated:  Patient Education method: Medical illustrator Education comprehension: verbalized understanding and returned demonstration  HOME EXERCISE PROGRAM: [id number]  ASSESSMENT:  CLINICAL IMPRESSION: Patient arrives reporting some improvement in her distal leg symptoms with dry needling at last PT session. Today's session included repeated motions testing for lumbar extension preference. She appeared to have improvement and centralization with lumbar extension in standing, but then felt increased pain to left buttock and like her back was weak after prone press ups. Patient did not seem to benefit from prone press ups without causing unwanted negative symptoms, so she was advised to utilize standing extension to help control symptoms. She did report some relief in her low back by end of session. Dry needling to left lumbar multifidi produced sensation that radiates to left LE. Patient was better able to complete concordant activity with left step up and simulation of getting out of the car by end of session.Patient would benefit from continued management of limiting condition by skilled physical therapist to address remaining impairments and functional limitations to work towards stated goals and return to PLOF or maximal functional independence.  From Re-Evaluation 11/19/2022:  Patient is a 32 y.o. 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 Patient is returning to PT more than 90 days since last PT visit to continue care for the same condition as before. She completed 5 physical therapy sessions with improvement in symptoms before her attendance lapsed. She now presents with worsening left LE symptoms and low back and left glute pain since her work environment  changed. She had good response to physical therapy intervention in the past and good potential for improvement again with consistent attendance. Patient did demonstrate improvement in trunk endurance at today's goal assessment. Patient presents with significant pain, paresthesia, muscle tension, motor control, muscle performance (strength/power/endurance), and activity tolerance impairments that are limiting ability to complete her usual activities such as working, walking, prolonged standing, bending, lifting, yardwork, housework, sleeping, traveling, prolonged sitting, going to the beach, anything that requires repetitive bending or lifting without difficulty. Patient will benefit from skilled physical therapy intervention to address current body structure impairments and activity limitations to improve function and work towards goals set in current POC in order to return to prior level of function or maximal functional improvement.    OBJECTIVE IMPAIRMENTS: Abnormal gait, decreased activity tolerance, decreased balance, decreased endurance, decreased knowledge of condition, decreased mobility, difficulty walking, decreased ROM, decreased strength, increased fascial restrictions, impaired perceived functional ability, increased muscle spasms, impaired flexibility, improper body mechanics, postural dysfunction, obesity, and pain.   ACTIVITY LIMITATIONS: carrying, lifting, bending, sitting, standing, squatting, stairs, transfers, hygiene/grooming, locomotion level, and caring for others  PARTICIPATION LIMITATIONS: meal prep, cleaning, laundry, driving, shopping, community activity, occupation, yard work, and   working, walking, prolonged standing, bending, lifting, yardwork, housework, sleeping, traveling, prolonged sitting, going to [employer], anything that requires repetitive bending or lifting   PERSONAL FACTORS: Fitness, Past/current experiences, Time since onset of injury/illness/exacerbation, and 1-2  comorbidities: lateral meniscus tear of left knee; Palpitation; Atypical chest pain; Family history of heart disease; Shortness of breath; and Sinus tachycardia on their problem list. past medical history of Acute meniscal tear of knee, Allergy, Anxiety, Asthma, Atrial fibrillation (HCC), COVID-19 (04/2020), Depression, Ovarian cyst, PVC (premature ventricular contraction), Scoliosis, Shingles, and Tachycardia. past surgical history that includes RIGHT INDEX  FINGER TENDON REPAIR (NOV 2013); Knee arthroscopy with lateral menisectomy (Left, 09/01/2012); and Meniscus repair (Right)  are also affecting patient's functional outcome.   REHAB POTENTIAL: Good  CLINICAL DECISION MAKING: Evolving/moderate complexity  EVALUATION COMPLEXITY: Moderate   GOALS: Goals reviewed with patient? Yes  SHORT TERM GOALS: Target date: 08/07/22. Target date updated to 12/04/2022 on 11/19/2022.   Pt will be independent with initial HEP in order to improve strength and balance in order to decrease fall risk and improve function at home and work.  Baseline:HEP given; patient not participating (11/19/2022);  Goal status: in progress   LONG TERM GOALS: Target date: 08/26/22. Target date updated to 02/11/2023 for all unmet goals on 11/19/2022.   Patient will demonstrate improved FOTO by equal or greater than 10 by visit #17 to demonstrate improvement in overall condition and self-reported functional ability.   Baseline: initial baseline 55. That episode closed due to inactivity. New baseline to be established with new questionnaire to be recorded at visit 7 (11/20/2022);  Goal status: in-progress  2.  Pt will decrease worst pain as  reported on NPRS by at least 3 points in order to demonstrate clinically significant reduction in pain.  Baseline: 4/10 (07/01/2022): up to 10/10  (11/19/2022);  Goal status: on-going  3.  Pt will demonstrate plank time of or more to demonstrate age-predicted match of core strength needed for  job duties Baseline: 10sec (07/01/2022): 24 seconds (11/19/2022);  Goal status: In-progress  4.  Pt will demonstrate bering sorenson test of 34sec or more to demonstrate clinically significant increase in spinal extensor strength needed for job duties Baseline: 20sec (07/01/2022); 58 seconds (11/19/2022);  Goal status: MET  5.  Patient will be independent with initial home exercise program for self-management of symptoms. Baseline: HEP to be reviewed and updated as needed at visit 7 as appropriate (11/19/22); Goal status: in-progress    PLAN:  PT FREQUENCY: 1-2x/week  PT DURATION: 12 weeks  PLANNED INTERVENTIONS: Therapeutic exercises, Therapeutic activity, Neuromuscular re-education, Balance training, Gait training, Patient/Family education, Self Care, Joint mobilization, Stair training, Dry Needling, Electrical stimulation, Spinal mobilization, Cryotherapy, Moist heat, Manual therapy, and Re-evaluation.  PLAN FOR NEXT SESSION:  update HEP as appropriate, progressive core/LE/functional strengthening, consider repeated motions assessment, education. Manual therapy / dry needling as needed.   Luretha Murphy. [name], PT, DPT 11/21/22, 6:59 PM  [facility] [address] [place], [place] [address] P: [phone] I F: [phone]

## 2022-11-21 ENCOUNTER — Encounter: Payer: Self-pay | Admitting: Physical Therapy

## 2022-11-21 ENCOUNTER — Ambulatory Visit: Payer: 59 | Admitting: Physical Therapy

## 2022-11-21 DIAGNOSIS — M792 Neuralgia and neuritis, unspecified: Secondary | ICD-10-CM | POA: Diagnosis not present

## 2022-11-21 DIAGNOSIS — M62838 Other muscle spasm: Secondary | ICD-10-CM | POA: Diagnosis not present

## 2022-11-21 DIAGNOSIS — M5459 Other low back pain: Secondary | ICD-10-CM

## 2022-11-26 ENCOUNTER — Ambulatory Visit: Payer: 59 | Admitting: Physical Therapy

## 2022-11-26 ENCOUNTER — Encounter: Payer: Self-pay | Admitting: Physical Therapy

## 2022-11-26 DIAGNOSIS — M792 Neuralgia and neuritis, unspecified: Secondary | ICD-10-CM | POA: Diagnosis not present

## 2022-11-26 DIAGNOSIS — M62838 Other muscle spasm: Secondary | ICD-10-CM

## 2022-11-26 DIAGNOSIS — M5459 Other low back pain: Secondary | ICD-10-CM

## 2022-11-26 NOTE — Therapy (Signed)
OUTPATIENT PHYSICAL THERAPY TREATMENT NOTE   Patient Name: Theresa Ortiz MRN: [id number] DOB:1990/11/29, 32 y.o., female Today's Date: 11/26/2022  END OF SESSION:  PT End of Session - 11/26/22 1648     Visit Number 8    Number of Visits 17    Date for PT Re-Evaluation 02/11/23    Authorization Type [place] [employer] F[employer] reporting period from 07/01/2022    Authorization Time Period VL: 25 PT/OT combined, Auth req after 25th visit    Authorization - Visit Number 8    Authorization - Number of Visits 25    Progress Note Due on Visit 10    PT Start Time 1648    PT Stop Time 1736    PT Time Calculation (min) 48 min    Activity Tolerance Patient limited by pain    Behavior During Therapy [facility] for tasks assessed/performed                Past Medical History:  Diagnosis Date   Acute meniscal tear of knee    left knee   Allergy    Anxiety    Asthma    Atrial fibrillation (HCC)    slight   COVID-19 04/2020   Depression    Ovarian cyst    PVC (premature ventricular contraction)    Scoliosis    Shingles    Tachycardia    Past Surgical History:  Procedure Laterality Date   KNEE ARTHROSCOPY WITH LATERAL MENISECTOMY Left 09/01/2012   Procedure: LEFT KNEE ARTHROSCOPY WITH LATERAL MENISCECTOMY ;  Surgeon: Jacki Cones, MD;  Location: [facility];  Service: Orthopedics;  Laterality: Left;   MENISCUS REPAIR Right    RIGHT INDEX  FINGER TENDON REPAIR  NOV 2013   Patient Active Problem List   Diagnosis Date Noted   Palpitation 07/13/2018   Atypical chest pain 07/13/2018   Family history of heart disease 07/13/2018   Shortness of breath 07/13/2018   Sinus tachycardia 07/13/2018   Acute lateral meniscus tear of left knee 09/01/2012    PCP: Tisovec, Adelfa Koh, MD  REFERRING PROVIDER: Filomena Jungling, MD  REFERRING DIAG: 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 Pt reports she has some pain at the mid calf that is the size of packing tape that feels like a cramp. She reports she has tension at L low back and glute. LBP/glute pain aggravated by lifting/moving patients, and bending forward. Has had L sided sciatic symptoms 1x/week when lifting something heavy down post LLE. She hs previously had pain with prolonged walking, but that has subsided over the past year or so. Has had successful PT in the past with [facility], which she thinks is very helpful. Pt works full time as an Engineer, structural, enjoys traveling and walking her dog. Comorbid conditions as listed above. No falls in past 6 months.  denies N/V, B&B changes, unexplained weight fluctuation, saddle paresthesia, fever, night sweats, or unrelenting night pain at this time. Patient denies hx of cancer, stroke, seizures, diabetes, unexplained weight loss, unexplained changes in bowel or bladder problems, unexplained stumbling or dropping things, osteoporosis, and spinal surgery  SUBJECTIVE:  SUBJECTIVE STATEMENT: Patient states things are going okay except when she lays down on the couch and bed it feels so weak that if she doesn't really engage her core strongly it really hurts to get up. Engaging her core helps but does not eliminate the pain. She states she felt pretty good after last PT session. She states her calf seemed to be "happier." She did not feel the fatigue in her left leg walking into work today.  She did some lumbar extension at home but was worried about  what made her back "mad" so she didn't do as many. She also got a new mattress that is firmer. Her leg has been better. She does get some urinary leaking with sneezing, coughing. She also will have a little urinary leaking if she holds her urine in for too long. She states she was once treated for either overactive or underactive bladder. She was given a medication for the prostate from the urologist that helped some. She was waking up at night with a weak stream. The bladder scan showed a certain amount of urine in her bladder.   PAIN:  NPRS: 1/10 in low back and a little to the left.   PATIENT GOALS: Get the cramp out of my calf, to "eleviate whatever pissed it off this time out of the leg"   OBJECTIVE  TODAY'S TREATMENT: Therapeutic exercise: to centralize symptoms and improve ROM, strength, muscular endurance, and activity tolerance required for successful completion of functional activities.  - Repeated lumbar extension over plinth, 1x10-15 (increased feeling of "instability" in lumbar spine).  - subjective exam and education about pelvic floor, relevance to described symptoms, low back pain, and core stability.  - seated pelvic floor contraction and relaxation, 1x5 plus some additional trials with breathing.  - trial of squeezing pelvic floor prior to and during most painful portion of lowering in stand to sit. (Slight improvement in back pain when able to coordinate with motion).  - Education on HEP including handout   Manual therapy: to reduce pain and tissue tension, improve range of motion, neuromodulation, in order to promote improved ability to complete functional activities. PRONE  - STM to bilateral lumbar paraspinals.   Modality: Dry needling performed to bilateral lumbar multifidi at L3, L4 and L5 level and left glute and deep hip rotators to decrease pain and spasms along patient's back and left LE region with patient in prone utilizing 4 dry needle(s) .30mm x 75mm with one  stick at R and L lumbar multifidi at L4 and L5 levels. Patient educated about the risks and benefits from therapy and verbally consents to treatment.  Dry needling performed by Luretha Murphy. [name] PT, DPT who is certified in this technique.   PATIENT EDUCATION:  Education details: Exercise purpose/form. Self management techniques. Education on diagnosis, prognosis, POC, anatomy and physiology of current condition. Education on dry needling.  Person educated: Patient Education method: Medical illustrator Education comprehension: verbalized understanding and returned demonstration  HOME EXERCISE PROGRAM: Access Code: [email or web address] URL: [email or web address] Date: 11/26/2022 Prepared by: Norton Blizzard  Exercises - Supine Sciatic Nerve Glide  - 1-2 x daily - 7 x weekly - 10 reps - Beginner Bridge  - 5 x weekly - 2 sets - 12 reps - Seated Pelvic Floor Elevators  - 1 x daily - 2 sets - 10 reps - Seated Quick Flick Pelvic Floor Contractions  - 1 x daily - 2 sets - 10 reps - Seated Pelvic Floor Contraction  - 1  x daily - 2 sets - 10 reps  ASSESSMENT:  CLINICAL IMPRESSION: Patient arrives reporting some improvement in left LE symptoms but continued feeling of lumbar instability. Today's session introduced role of pelvic floor to core stability and motor control, with HEP updated to include beginning pelvic floor exercises. Patient also underwent dry needling at the low back, which did not seem as pain relieving as previous sessions. Plan to focus on core and hip strengthening and motor control exercises at next session with dry needling as needed to help with more immediate relief of symptoms. Patient would benefit from continued management of limiting condition by skilled physical therapist to address remaining impairments and functional limitations to work towards stated goals and return to PLOF or maximal functional independence.   From Re-Evaluation 11/19/2022:  Patient is a 32  y.o. 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 Patient is returning to PT more than 90 days since last PT visit to continue care for the same condition as before. She completed 5 physical therapy sessions with improvement in symptoms before her attendance lapsed. She now presents with worsening left LE symptoms and low back and left glute pain since her work environment changed. She had good response to physical therapy intervention in the past and good potential for improvement again with consistent attendance. Patient did demonstrate improvement in trunk endurance at today's goal assessment. Patient presents with significant pain, paresthesia, muscle tension, motor control, muscle performance (strength/power/endurance), and activity tolerance impairments that are limiting ability to complete her usual activities such as working, walking, prolonged standing, bending, lifting, yardwork, housework, sleeping, traveling, prolonged sitting, going to the beach, anything that requires repetitive bending or lifting without difficulty. Patient will benefit from skilled physical therapy intervention to address current body structure impairments and activity limitations to improve function and work towards goals set in current POC in order to return to prior level of function or maximal functional improvement.    OBJECTIVE IMPAIRMENTS: Abnormal gait, decreased activity tolerance, decreased balance, decreased endurance, decreased knowledge of condition, decreased mobility, difficulty walking, decreased ROM, decreased strength, increased fascial restrictions, impaired perceived functional ability, increased muscle spasms, impaired flexibility, improper body mechanics, postural dysfunction,  obesity, and pain.   ACTIVITY LIMITATIONS: carrying, lifting, bending, sitting, standing, squatting, stairs, transfers, hygiene/grooming, locomotion level, and caring for others  PARTICIPATION LIMITATIONS: meal prep, cleaning, laundry, driving, shopping, community activity, occupation, yard work, and   working, walking, prolonged standing, bending, lifting, yardwork, housework, sleeping, traveling, prolonged sitting, going to [employer], anything that requires repetitive bending or lifting   PERSONAL FACTORS: Fitness, Past/current experiences, Time since onset of injury/illness/exacerbation, and 1-2 comorbidities: lateral meniscus tear of left knee; Palpitation; Atypical chest pain; Family history of heart disease; Shortness of breath; and Sinus tachycardia on their problem list. past medical history of Acute meniscal tear of knee, Allergy, Anxiety, Asthma, Atrial fibrillation (HCC), COVID-19 (04/2020), Depression, Ovarian cyst, PVC (premature ventricular contraction), Scoliosis, Shingles, and Tachycardia. past surgical history that includes RIGHT INDEX  FINGER TENDON REPAIR (NOV 2013); Knee arthroscopy with lateral menisectomy (Left, 09/01/2012); and Meniscus repair (Right)  are also affecting patient's functional outcome.   REHAB POTENTIAL: Good  CLINICAL DECISION MAKING: Evolving/moderate complexity  EVALUATION COMPLEXITY: Moderate   GOALS: Goals reviewed with patient? Yes  SHORT TERM GOALS: Target date: 08/07/22. Target  date updated to 12/04/2022 on 11/19/2022.   Pt will be independent with initial HEP in order to improve strength and balance in order to decrease fall risk and improve function at home and work.  Baseline:HEP given; patient not participating (11/19/2022);  Goal status: in progress   LONG TERM GOALS: Target date: 08/26/22. Target date updated to 02/11/2023 for all unmet goals on 11/19/2022.   Patient will demonstrate improved FOTO by equal or greater than 10 by visit #17 to  demonstrate improvement in overall condition and self-reported functional ability.   Baseline: initial baseline 55. That episode closed due to inactivity. New baseline to be established with new questionnaire to be recorded at visit 7 (11/20/2022);  Goal status: in-progress  2.  Pt will decrease worst pain as reported on NPRS by at least 3 points in order to demonstrate clinically significant reduction in pain.  Baseline: 4/10 (07/01/2022): up to 10/10  (11/19/2022);  Goal status: on-going  3.  Pt will demonstrate plank time of or more to demonstrate age-predicted match of core strength needed for job duties Baseline: 10sec (07/01/2022): 24 seconds (11/19/2022);  Goal status: In-progress  4.  Pt will demonstrate bering sorenson test of 34sec or more to demonstrate clinically significant increase in spinal extensor strength needed for job duties Baseline: 20sec (07/01/2022); 58 seconds (11/19/2022);  Goal status: MET  5.  Patient will be independent with initial home exercise program for self-management of symptoms. Baseline: HEP to be reviewed and updated as needed at visit 7 as appropriate (11/19/22); Goal status: in-progress    PLAN:  PT FREQUENCY: 1-2x/week  PT DURATION: 12 weeks  PLANNED INTERVENTIONS: Therapeutic exercises, Therapeutic activity, Neuromuscular re-education, Balance training, Gait training, Patient/Family education, Self Care, Joint mobilization, Stair training, Dry Needling, Electrical stimulation, Spinal mobilization, Cryotherapy, Moist heat, Manual therapy, and Re-evaluation.  PLAN FOR NEXT SESSION:  update HEP as appropriate, progressive core/LE/functional strengthening, consider repeated motions assessment, education. Manual therapy / dry needling as needed.   Luretha Murphy. [name], PT, DPT 11/26/22, 6:52 PM  [facility] [address] [place], [place] [address] P: [phone] I F: [phone]

## 2022-11-28 ENCOUNTER — Encounter: Payer: Self-pay | Admitting: Physical Therapy

## 2022-11-28 ENCOUNTER — Ambulatory Visit: Payer: 59 | Attending: Physical Medicine & Rehabilitation | Admitting: Physical Therapy

## 2022-11-28 DIAGNOSIS — M62838 Other muscle spasm: Secondary | ICD-10-CM | POA: Diagnosis not present

## 2022-11-28 DIAGNOSIS — M792 Neuralgia and neuritis, unspecified: Secondary | ICD-10-CM | POA: Diagnosis not present

## 2022-11-28 DIAGNOSIS — M5459 Other low back pain: Secondary | ICD-10-CM | POA: Insufficient documentation

## 2022-11-28 NOTE — Therapy (Signed)
OUTPATIENT PHYSICAL THERAPY TREATMENT NOTE   Patient Name: Theresa Ortiz MRN: [id number] DOB:08/19/1990, 32 y.o., female Today's Date: 11/28/2022  END OF SESSION:  PT End of Session - 11/28/22 1649     Visit Number 9    Number of Visits 17    Date for PT Re-Evaluation 02/11/23    Authorization Type [place] [employer] F[employer] reporting period from 07/01/2022    Authorization Time Period VL: 25 PT/OT combined, Auth req after 25th visit    Authorization - Visit Number 9    Authorization - Number of Visits 25    Progress Note Due on Visit 10    PT Start Time 1647    PT Stop Time 1725    PT Time Calculation (min) 38 min    Activity Tolerance Patient limited by pain    Behavior During Therapy [facility] for tasks assessed/performed             Past Medical History:  Diagnosis Date   Acute meniscal tear of knee    left knee   Allergy    Anxiety    Asthma    Atrial fibrillation (HCC)    slight   COVID-19 04/2020   Depression    Ovarian cyst    PVC (premature ventricular contraction)    Scoliosis    Shingles    Tachycardia    Past Surgical History:  Procedure Laterality Date   KNEE ARTHROSCOPY WITH LATERAL MENISECTOMY Left 09/01/2012   Procedure: LEFT KNEE ARTHROSCOPY WITH LATERAL MENISCECTOMY ;  Surgeon: Jacki Cones, MD;  Location: [facility] [place];  Service: Orthopedics;  Laterality: Left;   MENISCUS REPAIR Right    RIGHT INDEX  FINGER TENDON REPAIR  NOV 2013   Patient Active Problem List   Diagnosis Date Noted   Palpitation 07/13/2018   Atypical chest pain 07/13/2018   Family history of heart disease 07/13/2018   Shortness of breath 07/13/2018   Sinus tachycardia 07/13/2018   Acute lateral meniscus tear of left knee 09/01/2012    PCP: Tisovec, Adelfa Koh, MD  REFERRING PROVIDER: Filomena Jungling, MD  REFERRING DIAG: 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 Pt reports she has some pain at the mid calf that is the size of packing tape that feels like a cramp. She reports she has tension at L low back and glute. LBP/glute pain aggravated by lifting/moving patients, and bending forward. Has had L sided sciatic symptoms 1x/week when lifting something heavy down post LLE. She hs previously had pain with prolonged walking, but that has subsided over the past year or so. Has had successful PT in the past with [facility], which she thinks is very helpful. Pt works full time as an Engineer, structural, enjoys traveling and walking her dog. Comorbid conditions as listed above. No falls in past 6 months.  denies N/V, B&B changes, unexplained weight fluctuation, saddle paresthesia, fever, night sweats, or unrelenting night pain at this time. Patient denies hx of cancer, stroke, seizures, diabetes, unexplained weight loss, unexplained changes in bowel or bladder problems, unexplained stumbling or dropping things, osteoporosis, and spinal surgery  SUBJECTIVE:  SUBJECTIVE STATEMENT: Patient states it was easier for her to get up this morning with less pain. Her leg is feeling pretty good today but she does have some sensation in her calf.   PAIN:  NPRS: 2/10 base of low back when getting up from couch prior to PT session   PATIENT GOALS: Get the cramp out of my calf, to "eleviate whatever pissed it off this time out of the leg"   OBJECTIVE  TODAY'S TREATMENT: Therapeutic exercise: to centralize symptoms and improve ROM, strength,  muscular endurance, and activity tolerance required for successful completion of functional activities.  - NuStep level 1 using bilateral upper and lower extremities. Seat/handle setting 9/9. For improved extremity mobility, muscular endurance, and activity tolerance; and to induce the analgesic effect of aerobic exercise, stimulate improved joint nutrition, and prepare body structures and systems for following interventions. x 5  minutes. Average SPM = 68. - half hooklying curl up, 5x30 seconds with hands over chest.  - hooklying bridge with toes up without articulation, 1x8 with 5 second hold. Plus several other reps with trial of articulated vs unarticulated bridge to see which is most comfortable.  - Quadruped bird dog (alternating shoulder flexion/contralateral hip extension with core muscles braced). 1x10 each side with 5 second holds. SBA. Patient with a lot of difficulty maintaining balance and found exercise exhausting, but not painful (Added to hep) - Education on HEP including handout   Manual therapy: to reduce pain and tissue tension, improve range of motion, neuromodulation, in order to promote improved ability to complete functional activities. PRONE  - STM to left glute max and deep hip rotators including using sustained pressure with and without elbow with and without PROM L hip IR/ER  Modality: Dry needling performed to left glute and deep hip rotators to decrease pain and spasms along patient's left glute and leg region with patient in prone utilizing 2 dry needle(s) .30mm x with 2 sticks at superior fibers of glute max along iliac crest and 1 stick in piriformis region. Patient educated about the risks and benefits from therapy and verbally consents to treatment.  Dry needling performed by Luretha Murphy. [name] PT, DPT who is certified in this technique.    PATIENT EDUCATION:  Education details: Exercise purpose/form. Self management techniques. Education on diagnosis, prognosis,  POC, anatomy and physiology of current condition. Education on dry needling.  Person educated: Patient Education method: Medical illustrator Education comprehension: verbalized understanding and returned demonstration  HOME EXERCISE PROGRAM: Access Code: [email or web address] URL: [email or web address] Date: 11/26/2022 Prepared by: Norton Blizzard  Exercises - Supine Sciatic Nerve Glide  - 1-2 x daily - 7 x weekly - 10 reps - Beginner Bridge  - 5 x weekly - 2 sets - 12 reps - Seated Pelvic Floor Elevators  - 1 x daily - 2 sets - 10 reps - Seated Quick Flick Pelvic Floor Contractions  - 1 x daily - 2 sets - 10 reps - Seated Pelvic Floor Contraction  - 1 x daily - 2 sets - 10 reps  ASSESSMENT:  CLINICAL IMPRESSION: Patient arrives continued feeling of weakness in the low back and symptoms in the left LE to the calf. Today's session focused more on core strengthening and motor control exercises. Patient tolerated neutral curl up and bird dog exercise well overall but had some difficulty with bridges due to increasing pain in the left glute, so it was discontinued. She also felt a lot of tension in her upper traps during curl up  that is consistent with weak neck muscles that would benefit from strengthening with the curl up. Patient did have a lot of difficulty balancing during the bird dog and found it very challenging. Patient reported the dry needle to the piriformis region felt like it was right int the spot causing her pain. She had difficulty getting up off the mat at the end of session due to the weak feeling she has in her low back when transitioning from sit to stand. The weak feeling was more centralized to the sacrum by end of session. Patient's HEP was updated to include bird dog exercise. Patient continues to present as needing improved motor control and strength in the low back. Patient would benefit from continued management of limiting condition by skilled physical therapist  to address remaining impairments and functional limitations to work towards stated goals and return to PLOF or maximal functional independence.   From Re-Evaluation 11/19/2022:  Patient is a 32 y.o. 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 Patient is returning to PT more than 90 days since last PT visit to continue care for the same condition as before. She completed 5 physical therapy sessions with improvement in symptoms before her attendance lapsed. She now presents with worsening left LE symptoms and low back and left glute pain since her work environment changed. She had good response to physical therapy intervention in the past and good potential for improvement again with consistent attendance. Patient did demonstrate improvement in trunk endurance at today's goal assessment. Patient presents with significant pain, paresthesia, muscle tension, motor control, muscle performance (strength/power/endurance), and activity tolerance impairments that are limiting ability to complete her usual activities such as working, walking, prolonged standing, bending, lifting, yardwork, housework, sleeping, traveling, prolonged sitting, going to the beach, anything that requires repetitive bending or lifting without difficulty. Patient will benefit from skilled physical therapy intervention to address current body structure impairments and activity limitations to improve function and work towards goals set in current POC in order to return to prior level of function or maximal functional improvement.    OBJECTIVE IMPAIRMENTS: Abnormal gait, decreased activity tolerance, decreased balance, decreased endurance, decreased knowledge of condition, decreased mobility, difficulty walking,  decreased ROM, decreased strength, increased fascial restrictions, impaired perceived functional ability, increased muscle spasms, impaired flexibility, improper body mechanics, postural dysfunction, obesity, and pain.   ACTIVITY LIMITATIONS: carrying, lifting, bending, sitting, standing, squatting, stairs, transfers, hygiene/grooming, locomotion level, and caring for others  PARTICIPATION LIMITATIONS: meal prep, cleaning, laundry, driving, shopping, community activity, occupation, yard work, and   working, walking, prolonged standing, bending, lifting, yardwork, housework, sleeping, traveling, prolonged sitting, going to [employer], anything that requires repetitive bending or lifting   PERSONAL FACTORS: Fitness, Past/current experiences, Time since onset of injury/illness/exacerbation, and 1-2 comorbidities: lateral meniscus tear of left knee; Palpitation; Atypical chest pain; Family history of heart disease; Shortness of breath; and Sinus tachycardia on their problem list. past medical history of Acute meniscal tear of knee, Allergy, Anxiety, Asthma, Atrial fibrillation (HCC), COVID-19 (04/2020), Depression, Ovarian cyst, PVC (premature ventricular contraction), Scoliosis, Shingles, and Tachycardia. past surgical history that includes RIGHT INDEX  FINGER TENDON REPAIR (NOV 2013); Knee arthroscopy with lateral menisectomy (Left, 09/01/2012); and Meniscus repair (Right)  are also affecting patient's functional outcome.   REHAB POTENTIAL: Good  CLINICAL DECISION  MAKING: Evolving/moderate complexity  EVALUATION COMPLEXITY: Moderate   GOALS: Goals reviewed with patient? Yes  SHORT TERM GOALS: Target date: 08/07/22. Target date updated to 12/04/2022 on 11/19/2022.   Pt will be independent with initial HEP in order to improve strength and balance in order to decrease fall risk and improve function at home and work.  Baseline:HEP given; patient not participating (11/19/2022);  Goal status: in  progress   LONG TERM GOALS: Target date: 08/26/22. Target date updated to 02/11/2023 for all unmet goals on 11/19/2022.   Patient will demonstrate improved FOTO by equal or greater than 10 by visit #17 to demonstrate improvement in overall condition and self-reported functional ability.   Baseline: initial baseline 55. That episode closed due to inactivity. New baseline to be established with new questionnaire to be recorded at visit 7 (11/20/2022);  Goal status: in-progress  2.  Pt will decrease worst pain as reported on NPRS by at least 3 points in order to demonstrate clinically significant reduction in pain.  Baseline: 4/10 (07/01/2022): up to 10/10  (11/19/2022);  Goal status: on-going  3.  Pt will demonstrate plank time of or more to demonstrate age-predicted match of core strength needed for job duties Baseline: 10sec (07/01/2022): 24 seconds (11/19/2022);  Goal status: In-progress  4.  Pt will demonstrate bering sorenson test of 34sec or more to demonstrate clinically significant increase in spinal extensor strength needed for job duties Baseline: 20sec (07/01/2022); 58 seconds (11/19/2022);  Goal status: MET  5.  Patient will be independent with initial home exercise program for self-management of symptoms. Baseline: HEP to be reviewed and updated as needed at visit 7 as appropriate (11/19/22); Goal status: in-progress    PLAN:  PT FREQUENCY: 1-2x/week  PT DURATION: 12 weeks  PLANNED INTERVENTIONS: Therapeutic exercises, Therapeutic activity, Neuromuscular re-education, Balance training, Gait training, Patient/Family education, Self Care, Joint mobilization, Stair training, Dry Needling, Electrical stimulation, Spinal mobilization, Cryotherapy, Moist heat, Manual therapy, and Re-evaluation.  PLAN FOR NEXT SESSION:  update HEP as appropriate, progressive core/LE/functional strengthening, consider repeated motions assessment, education. Manual therapy / dry needling as needed.    Luretha Murphy. [name], PT, DPT 11/28/22, 7:54 PM  [facility] [address] [place], [place] [address] P: [phone] I F: [phone]

## 2022-12-02 ENCOUNTER — Ambulatory Visit: Payer: 59 | Admitting: Physical Therapy

## 2022-12-02 ENCOUNTER — Encounter: Payer: Self-pay | Admitting: Physical Therapy

## 2022-12-02 DIAGNOSIS — M25571 Pain in right ankle and joints of right foot: Secondary | ICD-10-CM | POA: Diagnosis not present

## 2022-12-02 DIAGNOSIS — M62838 Other muscle spasm: Secondary | ICD-10-CM | POA: Diagnosis not present

## 2022-12-02 DIAGNOSIS — M792 Neuralgia and neuritis, unspecified: Secondary | ICD-10-CM

## 2022-12-02 DIAGNOSIS — M5459 Other low back pain: Secondary | ICD-10-CM

## 2022-12-02 NOTE — Therapy (Signed)
OUTPATIENT PHYSICAL THERAPY TREATMENT NOTE / PROGRESS NOTE Dates of reporting from 07/01/2022 to 12/02/2022   Patient Name: Theresa Ortiz MRN: [id number] DOB:1991/04/10, 32 y.o., female Today's Date: 12/02/2022  END OF SESSION:  PT End of Session - 12/02/22 1905     Visit Number 10    Number of Visits 17    Date for PT Re-Evaluation 02/11/23    Authorization Type [place] [employer] reporting period from 07/01/2022    Authorization Time Period VL: 25 PT/OT combined, Auth req after 25th visit    Authorization - Visit Number 10    Authorization - Number of Visits 25    Progress Note Due on Visit 10    PT Start Time 1905    PT Stop Time 1945    PT Time Calculation (min) 40 min    Activity Tolerance Patient limited by pain    Behavior During Therapy [facility] for tasks assessed/performed              Past Medical History:  Diagnosis Date   Acute meniscal tear of knee    left knee   Allergy    Anxiety    Asthma    Atrial fibrillation (HCC)    slight   COVID-19 04/2020   Depression    Ovarian cyst    PVC (premature ventricular contraction)    Scoliosis    Shingles    Tachycardia    Past Surgical History:  Procedure Laterality Date   KNEE ARTHROSCOPY WITH LATERAL MENISECTOMY Left 09/01/2012   Procedure: LEFT KNEE ARTHROSCOPY WITH LATERAL MENISCECTOMY ;  Surgeon: Jacki Cones, MD;  Location: [facility];  Service: Orthopedics;  Laterality: Left;   MENISCUS REPAIR Right    RIGHT INDEX  FINGER TENDON REPAIR  NOV 2013   Patient Active Problem List   Diagnosis Date Noted   Palpitation 07/13/2018   Atypical chest pain 07/13/2018   Family history of heart disease 07/13/2018   Shortness of breath 07/13/2018   Sinus tachycardia 07/13/2018   Acute lateral meniscus tear of left knee 09/01/2012    PCP: Tisovec, Adelfa Koh, MD  REFERRING PROVIDER: Filomena Jungling, MD  REFERRING DIAG: 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 Pt reports she has some pain at the mid calf that is the size of packing tape that feels like a cramp. She reports she has tension at L low back and glute. LBP/glute pain aggravated by lifting/moving patients, and bending forward. Has had L sided sciatic symptoms 1x/week when lifting something heavy down post LLE. She hs previously had pain with prolonged walking, but that has subsided over the past year or so. Has had successful PT in the past with [facility], which she thinks is very helpful. Pt works full time as an Engineer, structural, enjoys traveling and walking her dog. Comorbid conditions as listed above. No falls in past 6 months.  denies N/V, B&B changes, unexplained weight fluctuation, saddle paresthesia, fever, night sweats, or unrelenting night pain at this time. Patient denies hx of cancer, stroke, seizures, diabetes, unexplained weight loss, unexplained changes in bowel or bladder problems, unexplained stumbling or dropping  things, osteoporosis, and spinal surgery  SUBJECTIVE:                                                                                                                                                                                           SUBJECTIVE STATEMENT: Patient state she is sore in her abdominal region. She has been doing bird dogs and they have been "kicking my butt." She went to a concert last night and her back was very irritated by so much sitting.   PAIN:  NPRS: 1/10 base of low back and at top of left buttocks when she was sitting in the car.    PATIENT GOALS: Get the cramp out of my calf, to "eleviate whatever pissed it off this time out of the  leg"   OBJECTIVE  TODAY'S TREATMENT: Therapeutic exercise: to centralize symptoms and improve ROM, strength, muscular endurance, and activity tolerance required for successful completion of functional activities.  - NuStep level 3 using bilateral upper and lower extremities. Seat/handle setting 9/9. For improved extremity mobility, muscular endurance, and activity tolerance; and to induce the analgesic effect of aerobic exercise, stimulate improved joint nutrition, and prepare body structures and systems for following interventions. x 5:45  minutes. Average SPM = 69. - half hooklying curl up, 5x30 seconds with hands over chest.  - self traction with upper body on table, hanging legs off with knees bent, 3x10-20 seconds (painful but feels good).  - hooklying bridge with toes up without articulation, 2x10 with 5 second hold.  - Quadruped bird dog (alternating shoulder flexion/contralateral hip extension with core muscles braced). 1x10 each side with 5 second holds. Supervision.  - hooklying sciatic nerve glide, tensioner with ankle DF/PF, 1x15 each side.  - supine hamstring stretch, 2x30 seconds each side.   Pt required multimodal cuing for proper technique and to facilitate improved neuromuscular control, strength, range of motion, and functional ability resulting in improved performance and form.   PATIENT EDUCATION:  Education details: Exercise purpose/form. Self management techniques. Education on diagnosis, prognosis, POC, anatomy and physiology of current condition. Education on dry needling.  Person educated: Patient Education method: Medical illustrator Education comprehension: verbalized understanding and returned demonstration  HOME EXERCISE PROGRAM: Access Code: [email or web address] URL: [email or web address] Date: 12/02/2022 Prepared by: Norton Blizzard  Exercises - Seated Pelvic Floor Elevators  - 1 x daily - 2 sets - 10 reps - Seated Quick Flick Pelvic Floor  Contractions  - 1 x daily - 2 sets - 10 reps - Seated Pelvic Floor Contraction  - 1 x daily - 2 sets - 10 reps - Supine Sciatic Nerve Glide  - 1-2 x  daily - 7 x weekly - 10 reps - Bird Dog  - 3-5 x weekly - 2 sets - 10 reps - 5 second hold - Neutral Curl Up with Arms Across Chest  - 3-5 x weekly - 5 reps - 30 seconds hold  HOME EXERCISE PROGRAM [[email or web address]] View at [email or web address]" using code: [email or web address] SELF TRACTION - LEVEL 3 - H16 -   ASSESSMENT:  CLINICAL IMPRESSION: Patient has attended 10 physical therapy session since starting current episode of care on 07/01/2022. Patient was recently re-evaluated on 11/19/2022 at visit 7 when she returned to PT after over 90 days and her symptoms had worsened again. Since that time she has been making progress in pain control and resumed her HEP with good tolerance. She is responding well at this time to dry needling and core strengthening and motor control exercises. Plan to continue with current trajectory of care to formally re-assess goals at visit 17. Patient would benefit from continued management of limiting condition by skilled physical therapist to address remaining impairments and functional limitations to work towards stated goals and return to PLOF or maximal functional independence.   From Re-Evaluation 11/19/2022:  Patient is a 32 y.o. 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 Patient is returning to PT more than 90 days since last PT visit to continue care for the same condition as before. She completed 5 physical therapy sessions with improvement in symptoms before her attendance lapsed. She now presents with worsening left LE symptoms and low back and left glute pain since her work environment changed. She had  good response to physical therapy intervention in the past and good potential for improvement again with consistent attendance. Patient did demonstrate improvement in trunk endurance at today's goal assessment. Patient presents with significant pain, paresthesia, muscle tension, motor control, muscle performance (strength/power/endurance), and activity tolerance impairments that are limiting ability to complete her usual activities such as working, walking, prolonged standing, bending, lifting, yardwork, housework, sleeping, traveling, prolonged sitting, going to the beach, anything that requires repetitive bending or lifting without difficulty. Patient will benefit from skilled physical therapy intervention to address current body structure impairments and activity limitations to improve function and work towards goals set in current POC in order to return to prior level of function or maximal functional improvement.    OBJECTIVE IMPAIRMENTS: Abnormal gait, decreased activity tolerance, decreased balance, decreased endurance, decreased knowledge of condition, decreased mobility, difficulty walking, decreased ROM, decreased strength, increased fascial restrictions, impaired perceived functional ability, increased muscle spasms, impaired flexibility, improper body mechanics, postural dysfunction, obesity, and pain.   ACTIVITY LIMITATIONS: carrying, lifting, bending, sitting, standing, squatting, stairs, transfers, hygiene/grooming, locomotion level, and caring for others  PARTICIPATION LIMITATIONS: meal prep, cleaning, laundry, driving, shopping, community activity, occupation, yard work, and   working, walking, prolonged standing, bending, lifting, yardwork, housework, sleeping, traveling, prolonged sitting, going to [employer], anything that requires repetitive bending or lifting   PERSONAL FACTORS: Fitness, Past/current experiences, Time since onset of injury/illness/exacerbation, and 1-2 comorbidities:  lateral meniscus tear of left knee; Palpitation; Atypical chest pain; Family history of heart disease; Shortness of breath; and Sinus tachycardia on their problem list. past medical history of Acute meniscal tear of knee, Allergy, Anxiety, Asthma, Atrial fibrillation (HCC), COVID-19 (04/2020), Depression, Ovarian cyst, PVC (premature ventricular contraction), Scoliosis, Shingles, and  Tachycardia. past surgical history that includes RIGHT INDEX  FINGER TENDON REPAIR (NOV 2013); Knee arthroscopy with lateral menisectomy (Left, 09/01/2012); and Meniscus repair (Right)  are also affecting patient's functional outcome.   REHAB POTENTIAL: Good  CLINICAL DECISION MAKING: Evolving/moderate complexity  EVALUATION COMPLEXITY: Moderate   GOALS: Goals reviewed with patient? Yes  SHORT TERM GOALS: Target date: 08/07/22. Target date updated to 12/04/2022 on 11/19/2022.   Pt will be independent with initial HEP in order to improve strength and balance in order to decrease fall risk and improve function at home and work. Baseline:HEP given; patient not participating (11/19/2022); participating regularly (12/02/2022);  Goal status: MET   LONG TERM GOALS: Target date: 08/26/22. Target date updated to 02/11/2023 for all unmet goals on 11/19/2022.   Patient will demonstrate improved FOTO by equal or greater than 10 by visit #17 to demonstrate improvement in overall condition and self-reported functional ability.  Baseline: initial baseline 55. That episode closed due to inactivity. New baseline to be established with new questionnaire to be recorded at visit 7 (11/20/2022);  Goal status: in-progress  2.  Pt will decrease worst pain as reported on NPRS by at least 3 points in order to demonstrate clinically significant reduction in pain. Baseline: 4/10 (07/01/2022): up to 10/10  (11/19/2022);  Goal status: on-going  3.  Pt will demonstrate plank time of or more to demonstrate age-predicted match of core strength  needed for job duties Baseline: 10sec (07/01/2022): 24 seconds (11/19/2022);  Goal status: In-progress  4.  Pt will demonstrate bering sorenson test of 34sec or more to demonstrate clinically significant increase in spinal extensor strength needed for job duties Baseline: 20sec (07/01/2022); 58 seconds (11/19/2022);  Goal status: MET  5.  Patient will be independent with initial home exercise program for self-management of symptoms. Baseline: HEP to be reviewed and updated as needed at visit 7 as appropriate (11/19/22); Goal status: in-progress    PLAN:  PT FREQUENCY: 1-2x/week  PT DURATION: 12 weeks  PLANNED INTERVENTIONS: Therapeutic exercises, Therapeutic activity, Neuromuscular re-education, Balance training, Gait training, Patient/Family education, Self Care, Joint mobilization, Stair training, Dry Needling, Electrical stimulation, Spinal mobilization, Cryotherapy, Moist heat, Manual therapy, and Re-evaluation.  PLAN FOR NEXT SESSION:  update HEP as appropriate, progressive core/LE/functional strengthening, consider repeated motions assessment, education. Manual therapy / dry needling as needed.   Luretha Murphy. [name], PT, DPT 12/02/22, 8:16 PM  [facility] [address] [place], [place] [address] P: [phone] I F: [phone]

## 2022-12-04 ENCOUNTER — Encounter: Payer: Self-pay | Admitting: Physical Therapy

## 2022-12-04 ENCOUNTER — Ambulatory Visit: Payer: 59 | Admitting: Physical Therapy

## 2022-12-04 DIAGNOSIS — M792 Neuralgia and neuritis, unspecified: Secondary | ICD-10-CM

## 2022-12-04 DIAGNOSIS — M5459 Other low back pain: Secondary | ICD-10-CM | POA: Diagnosis not present

## 2022-12-04 DIAGNOSIS — M62838 Other muscle spasm: Secondary | ICD-10-CM | POA: Diagnosis not present

## 2022-12-04 NOTE — Therapy (Signed)
OUTPATIENT PHYSICAL THERAPY TREATMENT    Patient Name: DEUNDRA HULLUM MRN: [id number] DOB:[birth year], 32 y.o., female Today's Date: 12/04/2022  END OF SESSION:  PT End of Session - 12/04/22 1609     Visit Number 11    Number of Visits 17    Date for PT Re-Evaluation 02/11/23    Authorization Type [place] [employer] reporting period from 12/02/2022    Authorization Time Period VL: 25 PT/OT combined, Auth req after 25th visit    Authorization - Visit Number 11    Authorization - Number of Visits 25    Progress Note Due on Visit 20    PT Start Time 1602    PT Stop Time 1640    PT Time Calculation (min) 38 min    Activity Tolerance Patient limited by pain    Behavior During Therapy [facility] for tasks assessed/performed               Past Medical History:  Diagnosis Date   Acute meniscal tear of knee    left knee   Allergy    Anxiety    Asthma    Atrial fibrillation (HCC)    slight   COVID-19 04/2020   Depression    Ovarian cyst    PVC (premature ventricular contraction)    Scoliosis    Shingles    Tachycardia    Past Surgical History:  Procedure Laterality Date   KNEE ARTHROSCOPY WITH LATERAL MENISECTOMY Left 09/01/2012   Procedure: LEFT KNEE ARTHROSCOPY WITH LATERAL MENISCECTOMY ;  Surgeon: Jacki Cones, MD;  Location: [facility];  Service: Orthopedics;  Laterality: Left;   MENISCUS REPAIR Right    RIGHT INDEX  FINGER TENDON REPAIR  NOV 2013   Patient Active Problem List   Diagnosis Date Noted   Palpitation 07/13/2018   Atypical chest pain 07/13/2018   Family history of heart disease 07/13/2018   Shortness of breath 07/13/2018   Sinus tachycardia 07/13/2018   Acute lateral meniscus tear of left knee 09/01/2012    PCP: Tisovec, Adelfa Koh, MD  REFERRING PROVIDER: Filomena Jungling, MD  REFERRING DIAG: 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 Pt reports she has some pain at the mid calf that is the size of packing tape that feels like a cramp. She reports she has tension at L low back and glute. LBP/glute pain aggravated by lifting/moving patients, and bending forward. Has had L sided sciatic symptoms 1x/week when lifting something heavy down post LLE. She hs previously had pain with prolonged walking, but that has subsided over the past year or so. Has had successful PT in the past with [facility], which she thinks is very helpful. Pt works full time as an Engineer, structural, enjoys traveling and walking her dog. Comorbid conditions as listed above. No falls in past 6 months.  denies N/V, B&B changes, unexplained weight fluctuation, saddle paresthesia, fever, night sweats, or unrelenting night pain at this time. Patient denies hx of cancer, stroke, seizures, diabetes, unexplained weight loss, unexplained changes in bowel or bladder problems, unexplained stumbling or dropping things, osteoporosis, and spinal surgery  SUBJECTIVE:  SUBJECTIVE STATEMENT: Patient states she feels like an old lady today. She states today everything is sore. Her hips, abs, and buttocks is sore. It was sore to breathe today. She states "Monday kicked my butt" referring to PT session. She states she was helping with moving a patient from bed to table and back (patient not assisting at all) on Tuesday and afterwards while sitting with good posture in a chair she had a sensation that someone was stabbing a needle in her left sciatic  nerve. She feels it when she leans towards the left. Today it is not as sharp as it was Tuesday.   PAIN:  NPRS: 3/10 in hips, abs, buttocks, and low back and up to 6/10 sharp pain left buttocks along the sacrum. Both calves are campy.   PATIENT GOALS: Get the cramp out of my calf, to "eleviate whatever pissed it off this time out of the leg"   OBJECTIVE  TODAY'S TREATMENT: Therapeutic exercise: to centralize symptoms and improve ROM, strength, muscular endurance, and activity tolerance required for successful completion of functional activities.  - NuStep level 3 using bilateral upper and lower extremities. Seat/handle setting 9/9. For improved extremity mobility, muscular endurance, and activity tolerance; and to induce the analgesic effect of aerobic exercise, stimulate improved joint nutrition, and prepare body structures and systems for following interventions. x 5 minutes. Average SPM = 69.  Manual therapy: to reduce pain and tissue tension, improve range of motion, neuromodulation, in order to promote improved ability to complete functional activities. PRONE  - STM to bilateral lumbar paraspinals, left glute and deep hip rotator region including sustained pressure with and without L hip PROM IR/ER.    Modality: Dry needling performed to lumbar and left posterior hip to decrease pain and spasms along patient's left buttocks region with patient in prone utilizing 4 dry needle(s) .30mm x with 1 stick each side at L5 lumbar multifidi and 3 sticks at left piriformis region. Patient educated about the risks and benefits from therapy and verbally consents to treatment.  Dry needling performed by Luretha Murphy. [name] PT, DPT who is certified in this technique.   Pt required multimodal cuing for proper technique and to facilitate improved neuromuscular control, strength, range of motion, and functional ability resulting in improved performance and form.   PATIENT EDUCATION:  Education  details: Exercise purpose/form. Self management techniques. Education on diagnosis, prognosis, POC, anatomy and physiology of current condition. Education on dry needling.  Person educated: Patient Education method: Medical illustrator Education comprehension: verbalized understanding and returned demonstration  HOME EXERCISE PROGRAM: Access Code: [email or web address] URL: [email or web address] Date: 12/02/2022 Prepared by: Norton Blizzard  Exercises - Seated Pelvic Floor Elevators  - 1 x daily - 2 sets - 10 reps - Seated Quick Flick Pelvic Floor Contractions  - 1 x daily - 2 sets - 10 reps - Seated Pelvic Floor Contraction  - 1 x daily - 2 sets - 10 reps - Supine Sciatic Nerve Glide  - 1-2 x daily - 7 x weekly - 10 reps - Bird Dog  - 3-5 x weekly - 2 sets - 10 reps - 5 second hold - Neutral Curl Up with Arms Across Chest  - 3-5 x weekly - 5 reps - 30 seconds hold  HOME EXERCISE PROGRAM [[id number]] View at [email or web address]" using code: [id number] SELF TRACTION - LEVEL 3 - H16 -   ASSESSMENT:  CLINICAL IMPRESSION: Patient arrives with complaint of increased shooting pain in left buttock and bodywide soreness from  exercises at last PT session. Today's session focused more on pain control with manual therapy and dry needling at left buttock and low back. Patient reported a decrease in concordant pain and increased soreness by end of session.Patient would benefit from continued management of limiting condition by skilled physical therapist to address remaining impairments and functional limitations to work towards stated goals and return to PLOF or maximal functional independence.  From Re-Evaluation 11/19/2022:  Patient is a 32 y.o. 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 Patient is returning to PT more than 90 days since last PT visit to continue care for the same condition as before. She completed 5 physical therapy sessions with improvement in symptoms before her attendance lapsed. She now presents with worsening left LE symptoms and low back and left glute pain since her work environment changed. She had good response to physical therapy intervention in the past and good potential for improvement again with consistent attendance. Patient did demonstrate improvement in trunk endurance at today's goal assessment. Patient presents with significant pain, paresthesia, muscle tension, motor control, muscle performance (strength/power/endurance), and activity tolerance impairments that are limiting ability to complete her usual activities such as working, walking, prolonged standing, bending, lifting, yardwork, housework, sleeping, traveling, prolonged sitting, going to the beach, anything that requires repetitive bending or lifting without difficulty. Patient will benefit from skilled physical therapy intervention to address current body structure impairments and activity limitations to improve function and work towards goals set in current POC in order to return to prior level of function or maximal functional improvement.    OBJECTIVE IMPAIRMENTS: Abnormal gait, decreased activity tolerance, decreased balance, decreased endurance, decreased knowledge of condition, decreased mobility, difficulty walking, decreased ROM, decreased strength, increased fascial restrictions, impaired perceived functional ability, increased muscle spasms, impaired flexibility, improper body mechanics, postural dysfunction, obesity, and pain.   ACTIVITY LIMITATIONS: carrying, lifting, bending, sitting, standing, squatting, stairs, transfers, hygiene/grooming, locomotion level, and caring for others  PARTICIPATION LIMITATIONS: meal prep, cleaning, laundry, driving, shopping,  community activity, occupation, yard work, and   working, walking, prolonged standing, bending, lifting, yardwork, housework, sleeping, traveling, prolonged sitting, going to [employer], anything that requires repetitive bending or lifting   PERSONAL FACTORS: Fitness, Past/current experiences, Time since onset of injury/illness/exacerbation, and 1-2 comorbidities: lateral meniscus tear of left knee; Palpitation; Atypical chest pain; Family history of heart disease; Shortness of breath; and Sinus tachycardia on their problem list. past medical history of Acute meniscal tear of knee, Allergy, Anxiety, Asthma, Atrial fibrillation (HCC), COVID-19 (04/2020), Depression, Ovarian cyst, PVC (premature ventricular contraction), Scoliosis, Shingles, and Tachycardia. past surgical history that includes RIGHT INDEX  FINGER TENDON REPAIR (NOV 2013); Knee arthroscopy with lateral menisectomy (Left, 09/01/2012); and Meniscus repair (Right)  are also affecting patient's functional outcome.   REHAB POTENTIAL: Good  CLINICAL DECISION MAKING: Evolving/moderate complexity  EVALUATION COMPLEXITY: Moderate   GOALS: Goals reviewed with patient? Yes  SHORT TERM GOALS: Target date: 08/07/22. Target date updated to 12/04/2022 on 11/19/2022.   Pt will be independent with initial HEP in order to improve strength and balance in order to decrease fall risk and improve function at home and work. Baseline:HEP given; patient not participating (11/19/2022); participating regularly (12/02/2022);  Goal status: MET   LONG TERM GOALS: Target date: 08/26/22. Target date updated to 02/11/2023  for all unmet goals on 11/19/2022.   Patient will demonstrate improved FOTO by equal or greater than 10 by visit #17 to demonstrate improvement in overall condition and self-reported functional ability.  Baseline: initial baseline 55. That episode closed due to inactivity. New baseline to be established with new questionnaire to be recorded at visit 7  (11/20/2022);  Goal status: in-progress  2.  Pt will decrease worst pain as reported on NPRS by at least 3 points in order to demonstrate clinically significant reduction in pain. Baseline: 4/10 (07/01/2022): up to 10/10  (11/19/2022);  Goal status: on-going  3.  Pt will demonstrate plank time of or more to demonstrate age-predicted match of core strength needed for job duties Baseline: 10sec (07/01/2022): 24 seconds (11/19/2022);  Goal status: In-progress  4.  Pt will demonstrate bering sorenson test of 34sec or more to demonstrate clinically significant increase in spinal extensor strength needed for job duties Baseline: 20sec (07/01/2022); 58 seconds (11/19/2022);  Goal status: MET  5.  Patient will be independent with initial home exercise program for self-management of symptoms. Baseline: HEP to be reviewed and updated as needed at visit 7 as appropriate (11/19/22); Goal status: in-progress    PLAN:  PT FREQUENCY: 1-2x/week  PT DURATION: 12 weeks  PLANNED INTERVENTIONS: Therapeutic exercises, Therapeutic activity, Neuromuscular re-education, Balance training, Gait training, Patient/Family education, Self Care, Joint mobilization, Stair training, Dry Needling, Electrical stimulation, Spinal mobilization, Cryotherapy, Moist heat, Manual therapy, and Re-evaluation.  PLAN FOR NEXT SESSION:  update HEP as appropriate, progressive core/LE/functional strengthening, consider repeated motions assessment, education. Manual therapy / dry needling as needed.   Luretha Murphy. [name], PT, DPT 12/04/22, 6:48 PM  [facility] [address] [place], [place] [address] P: [phone] I F: [phone]

## 2022-12-09 ENCOUNTER — Ambulatory Visit: Payer: 59 | Admitting: Physical Therapy

## 2022-12-09 ENCOUNTER — Encounter: Payer: Self-pay | Admitting: Physical Therapy

## 2022-12-09 DIAGNOSIS — M792 Neuralgia and neuritis, unspecified: Secondary | ICD-10-CM

## 2022-12-09 DIAGNOSIS — M62838 Other muscle spasm: Secondary | ICD-10-CM

## 2022-12-09 DIAGNOSIS — M5459 Other low back pain: Secondary | ICD-10-CM

## 2022-12-09 NOTE — Therapy (Signed)
OUTPATIENT PHYSICAL THERAPY TREATMENT    Patient Name: Theresa Ortiz MRN: [id number] DOB:August 24, 1990, 32 y.o., female Today's Date: 12/09/2022  END OF SESSION:  PT End of Session - 12/09/22 1653     Visit Number 12    Number of Visits 17    Date for PT Re-Evaluation 02/11/23    Authorization Type [place] [employer] reporting period from 12/02/2022    Authorization Time Period VL: 25 PT/OT combined, Auth req after 25th visit    Authorization - Visit Number 12    Authorization - Number of Visits 25    Progress Note Due on Visit 20    PT Start Time 1653    PT Stop Time 1731    PT Time Calculation (min) 38 min    Activity Tolerance Patient limited by pain    Behavior During Therapy [facility] for tasks assessed/performed                Past Medical History:  Diagnosis Date   Acute meniscal tear of knee    left knee   Allergy    Anxiety    Asthma    Atrial fibrillation (HCC)    slight   COVID-19 04/2020   Depression    Ovarian cyst    PVC (premature ventricular contraction)    Scoliosis    Shingles    Tachycardia    Past Surgical History:  Procedure Laterality Date   KNEE ARTHROSCOPY WITH LATERAL MENISECTOMY Left 09/01/2012   Procedure: LEFT KNEE ARTHROSCOPY WITH LATERAL MENISCECTOMY ;  Surgeon: Jacki Cones, MD;  Location: [facility];  Service: Orthopedics;  Laterality: Left;   MENISCUS REPAIR Right    RIGHT INDEX  FINGER TENDON REPAIR  NOV 2013   Patient Active Problem List   Diagnosis Date Noted   Palpitation 07/13/2018   Atypical chest pain 07/13/2018   Family history of heart disease 07/13/2018   Shortness of breath 07/13/2018   Sinus tachycardia 07/13/2018   Acute lateral meniscus tear of left knee 09/01/2012    PCP: Tisovec, Adelfa Koh, MD  REFERRING PROVIDER: Filomena Jungling, MD  REFERRING DIAG: 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 Pt reports she has some pain at the mid calf that is the size of packing tape that feels like a cramp. She reports she has tension at L low back and glute. LBP/glute pain aggravated by lifting/moving patients, and bending forward. Has had L sided sciatic symptoms 1x/week when lifting something heavy down post LLE. She hs previously had pain with prolonged walking, but that has subsided over the past year or so. Has had successful PT in the past with [facility], which she thinks is very helpful. Pt works full time as an Engineer, structural, enjoys traveling and walking her dog. Comorbid conditions as listed above. No falls in past 6 months.  denies N/V, B&B changes, unexplained weight fluctuation, saddle paresthesia, fever, night sweats, or unrelenting night pain at this time. Patient denies hx of cancer, stroke, seizures, diabetes, unexplained weight loss, unexplained changes in bowel or bladder problems, unexplained stumbling or dropping things, osteoporosis, and spinal surgery  SUBJECTIVE:  SUBJECTIVE STATEMENT: Patient states she is not having crampy in her left glute region. She states she got some relief from last PT session. The whole way home after last PT session, it felt like it was cramping when she was abducting her leg when driving. It did this for at least 10 min and then it was really sore. Her leg was not "tired or achy" when walking in to work. She "didn't feel like a complete grandma" coming in to work this morning. She worked really hard today because they  were already short, then someone called.   PAIN:  NPRS: 2/10 left glute.   PATIENT GOALS: Get the cramp out of my calf, to "eleviate whatever pissed it off this time out of the leg"   OBJECTIVE  TODAY'S TREATMENT: Therapeutic exercise: to centralize symptoms and improve ROM, strength, muscular endurance, and activity tolerance required for successful completion of functional activities.  - NuStep level 4 using bilateral upper and lower extremities. Seat/handle setting 9/9. For improved extremity mobility, muscular endurance, and activity tolerance; and to induce the analgesic effect of aerobic exercise, stimulate improved joint nutrition, and prepare body structures and systems for following interventions. x 5 minutes. Average SPM = 71. (Manual therapy / dry needling - see below) - Quadruped bird dog (alternating shoulder flexion/contralateral hip extension with core muscles braced), 3x10 each side.   Manual therapy: to reduce pain and tissue tension, improve range of motion, neuromodulation, in order to promote improved ability to complete functional activities. PRONE  - STM to left glute and deep hip rotator region including sustained pressure with and without L hip PROM IR/ER.    Modality: Dry needling performed to left posterior hip to decrease pain and spasms along patient's left buttocks region with patient in prone utilizing 1 dry needle(s) .30mm x with 1 stick at  left piriformis region. Patient educated about the risks and benefits from therapy and verbally consents to treatment.  Dry needling performed by Luretha Murphy. [name] PT, DPT who is certified in this technique.   Pt required multimodal cuing for proper technique and to facilitate improved neuromuscular control, strength, range of motion, and functional ability resulting in improved performance and form.   PATIENT EDUCATION:  Education details: Exercise purpose/form. Self management techniques. Education on diagnosis,  prognosis, POC, anatomy and physiology of current condition. Education on dry needling.  Person educated: Patient Education method: Medical illustrator Education comprehension: verbalized understanding and returned demonstration  HOME EXERCISE PROGRAM: Access Code: [email or web address] URL: [email or web address] Date: 12/02/2022 Prepared by: Norton Blizzard  Exercises - Seated Pelvic Floor Elevators  - 1 x daily - 2 sets - 10 reps - Seated Quick Flick Pelvic Floor Contractions  - 1 x daily - 2 sets - 10 reps - Seated Pelvic Floor Contraction  - 1 x daily - 2 sets - 10 reps - Supine Sciatic Nerve Glide  - 1-2 x daily - 7 x weekly - 10 reps - Bird Dog  - 3-5 x weekly - 2 sets - 10 reps - 5 second hold - Neutral Curl Up with Arms Across Chest  - 3-5 x weekly - 5 reps - 30 seconds hold  HOME EXERCISE PROGRAM [[id number]] View at [email or web address]" using code: [id number] SELF TRACTION - LEVEL 3 - H16 -   ASSESSMENT:  CLINICAL IMPRESSION: Patient arrives with less shooting pain and a little improved ability to get up from sitting but continues to have pain at the left buttocks region near the sacrum. Continued with  dry needling to the left deep hip rotators followed by stabilization exercises. Patient tolerated session well with mild relief. Patient would benefit from continued management of limiting condition by skilled physical therapist to address remaining impairments and functional limitations to work towards stated goals and return to PLOF or maximal functional independence.   From Re-Evaluation 11/19/2022:  Patient is a 32 y.o. 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 Patient is returning to PT more than 90 days since last PT visit to continue  care for the same condition as before. She completed 5 physical therapy sessions with improvement in symptoms before her attendance lapsed. She now presents with worsening left LE symptoms and low back and left glute pain since her work environment changed. She had good response to physical therapy intervention in the past and good potential for improvement again with consistent attendance. Patient did demonstrate improvement in trunk endurance at today's goal assessment. Patient presents with significant pain, paresthesia, muscle tension, motor control, muscle performance (strength/power/endurance), and activity tolerance impairments that are limiting ability to complete her usual activities such as working, walking, prolonged standing, bending, lifting, yardwork, housework, sleeping, traveling, prolonged sitting, going to the beach, anything that requires repetitive bending or lifting without difficulty. Patient will benefit from skilled physical therapy intervention to address current body structure impairments and activity limitations to improve function and work towards goals set in current POC in order to return to prior level of function or maximal functional improvement.    OBJECTIVE IMPAIRMENTS: Abnormal gait, decreased activity tolerance, decreased balance, decreased endurance, decreased knowledge of condition, decreased mobility, difficulty walking, decreased ROM, decreased strength, increased fascial restrictions, impaired perceived functional ability, increased muscle spasms, impaired flexibility, improper body mechanics, postural dysfunction, obesity, and pain.   ACTIVITY LIMITATIONS: carrying, lifting, bending, sitting, standing, squatting, stairs, transfers, hygiene/grooming, locomotion level, and caring for others  PARTICIPATION LIMITATIONS: meal prep, cleaning, laundry, driving, shopping, community activity, occupation, yard work, and   working, walking, prolonged standing, bending, lifting,  yardwork, housework, sleeping, traveling, prolonged sitting, going to [employer], anything that requires repetitive bending or lifting   PERSONAL FACTORS: Fitness, Past/current experiences, Time since onset of injury/illness/exacerbation, and 1-2 comorbidities: lateral meniscus tear of left knee; Palpitation; Atypical chest pain; Family history of heart disease; Shortness of breath; and Sinus tachycardia on their problem list. past medical history of Acute meniscal tear of knee, Allergy, Anxiety, Asthma, Atrial fibrillation (HCC), COVID-19 (04/2020), Depression, Ovarian cyst, PVC (premature ventricular contraction), Scoliosis, Shingles, and Tachycardia. past surgical history that includes RIGHT INDEX  FINGER TENDON REPAIR (NOV 2013); Knee arthroscopy with lateral menisectomy (Left, 09/01/2012); and Meniscus repair (Right)  are also affecting patient's functional outcome.   REHAB POTENTIAL: Good  CLINICAL DECISION MAKING: Evolving/moderate complexity  EVALUATION COMPLEXITY: Moderate   GOALS: Goals reviewed with patient? Yes  SHORT TERM GOALS: Target date: 08/07/22. Target date updated to 12/04/2022 on 11/19/2022.   Pt will be independent with initial HEP in order to improve strength and balance in order to decrease fall risk and improve function at home and work. Baseline:HEP given; patient not participating (11/19/2022); participating regularly (12/02/2022);  Goal status: MET   LONG TERM GOALS: Target date: 08/26/22. Target date updated to 02/11/2023 for all unmet goals on 11/19/2022.   Patient will demonstrate improved FOTO by equal or greater  than 10 by visit #17 to demonstrate improvement in overall condition and self-reported functional ability.  Baseline: initial baseline 55. That episode closed due to inactivity. New baseline to be established with new questionnaire to be recorded at visit 7 (11/20/2022);  Goal status: in-progress  2.  Pt will decrease worst pain as reported on NPRS by at least 3  points in order to demonstrate clinically significant reduction in pain. Baseline: 4/10 (07/01/2022): up to 10/10  (11/19/2022);  Goal status: on-going  3.  Pt will demonstrate plank time of or more to demonstrate age-predicted match of core strength needed for job duties Baseline: 10sec (07/01/2022): 24 seconds (11/19/2022);  Goal status: In-progress  4.  Pt will demonstrate bering sorenson test of 34sec or more to demonstrate clinically significant increase in spinal extensor strength needed for job duties Baseline: 20sec (07/01/2022); 58 seconds (11/19/2022);  Goal status: MET  5.  Patient will be independent with initial home exercise program for self-management of symptoms. Baseline: HEP to be reviewed and updated as needed at visit 7 as appropriate (11/19/22); Goal status: in-progress    PLAN:  PT FREQUENCY: 1-2x/week  PT DURATION: 12 weeks  PLANNED INTERVENTIONS: Therapeutic exercises, Therapeutic activity, Neuromuscular re-education, Balance training, Gait training, Patient/Family education, Self Care, Joint mobilization, Stair training, Dry Needling, Electrical stimulation, Spinal mobilization, Cryotherapy, Moist heat, Manual therapy, and Re-evaluation.  PLAN FOR NEXT SESSION:  update HEP as appropriate, progressive core/LE/functional strengthening, consider repeated motions assessment, education. Manual therapy / dry needling as needed.   Luretha Murphy. [name], PT, DPT 12/09/22, 7:32 PM  [facility] [address] [place], [place] [address] P: [phone] I F: [phone]

## 2022-12-11 ENCOUNTER — Ambulatory Visit: Payer: 59 | Admitting: Physical Therapy

## 2022-12-11 ENCOUNTER — Encounter: Payer: Self-pay | Admitting: Physical Therapy

## 2022-12-11 DIAGNOSIS — M62838 Other muscle spasm: Secondary | ICD-10-CM

## 2022-12-11 DIAGNOSIS — M792 Neuralgia and neuritis, unspecified: Secondary | ICD-10-CM

## 2022-12-11 DIAGNOSIS — M5459 Other low back pain: Secondary | ICD-10-CM | POA: Diagnosis not present

## 2022-12-11 NOTE — Therapy (Signed)
OUTPATIENT PHYSICAL THERAPY TREATMENT    Patient Name: Theresa Ortiz MRN: [id number] DOB:07-14-1990, 32 y.o., female Today's Date: 12/11/2022  END OF SESSION:  PT End of Session - 12/11/22 1655     Visit Number 13    Number of Visits 17    Date for PT Re-Evaluation 02/11/23    Authorization Type [place] [employer] F[employer] reporting period from 12/02/2022    Authorization Time Period VL: 25 PT/OT combined, Auth req after 25th visit    Authorization - Visit Number 13    Authorization - Number of Visits 25    Progress Note Due on Visit 20    PT Start Time 1652    PT Stop Time 1730    PT Time Calculation (min) 38 min    Activity Tolerance Patient limited by pain    Behavior During Therapy [facility] for tasks assessed/performed                 Past Medical History:  Diagnosis Date   Acute meniscal tear of knee    left knee   Allergy    Anxiety    Asthma    Atrial fibrillation (HCC)    slight   COVID-19 04/2020   Depression    Ovarian cyst    PVC (premature ventricular contraction)    Scoliosis    Shingles    Tachycardia    Past Surgical History:  Procedure Laterality Date   KNEE ARTHROSCOPY WITH LATERAL MENISECTOMY Left 09/01/2012   Procedure: LEFT KNEE ARTHROSCOPY WITH LATERAL MENISCECTOMY ;  Surgeon: Jacki Cones, MD;  Location: [facility];  Service: Orthopedics;  Laterality: Left;   MENISCUS REPAIR Right    RIGHT INDEX  FINGER TENDON REPAIR  NOV 2013   Patient Active Problem List   Diagnosis Date Noted   Palpitation 07/13/2018   Atypical chest pain 07/13/2018   Family history of heart disease 07/13/2018   Shortness of breath 07/13/2018   Sinus tachycardia 07/13/2018   Acute lateral meniscus tear of left knee 09/01/2012    PCP: Tisovec, Adelfa Koh, MD  REFERRING PROVIDER: Filomena Jungling, MD  REFERRING DIAG: 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 Pt reports she has some pain at the mid calf that is the size of packing tape that feels like a cramp. She reports she has tension at L low back and glute. LBP/glute pain aggravated by lifting/moving patients, and bending forward. Has had L sided sciatic symptoms 1x/week when lifting something heavy down post LLE. She hs previously had pain with prolonged walking, but that has subsided over the past year or so. Has had successful PT in the past with [facility], which she thinks is very helpful. Pt works full time as an Engineer, structural, enjoys traveling and walking her dog. Comorbid conditions as listed above. No falls in past 6 months.  denies N/V, B&B changes, unexplained weight fluctuation, saddle paresthesia, fever, night sweats, or unrelenting night pain at this time. Patient denies hx of cancer, stroke, seizures, diabetes, unexplained weight loss, unexplained changes in bowel or bladder problems, unexplained stumbling or dropping things, osteoporosis, and spinal surgery  SUBJECTIVE:  SUBJECTIVE STATEMENT: Patient states she states she feels kind of "sore" like "muscle soreness" at her left glute. She also had a couple of episodes of numbness on the bottom of her left foot, today approx 15 min, and yesterday approx 30 min. Today it was associated with moving a patient, but not yesterday. She states getting up and down has been fine recently, but this week she has been sleeping on a couch.   PAIN:  NPRS: 1-1.5/10 left glute "soreness"  PATIENT GOALS: Get the  cramp out of my calf, to "eleviate whatever pissed it off this time out of the leg"   OBJECTIVE  TODAY'S TREATMENT: Therapeutic exercise: to centralize symptoms and improve ROM, strength, muscular endurance, and activity tolerance required for successful completion of functional activities.  - NuStep level 4 using bilateral upper and lower extremities. Seat/handle setting 9/9. For improved extremity mobility, muscular endurance, and activity tolerance; and to induce the analgesic effect of aerobic exercise, stimulate improved joint nutrition, and prepare body structures and systems for following interventions. x 5:46 minutes. Average SPM = 75. (Manual therapy / dry needling - see below) - Quadruped bird dog (alternating shoulder flexion/contralateral hip extension with core muscles braced), 3x10 each side.  - standing pallof press with 10# cable, 2x10 each side with 2 second hold.  - sit <> stand with OH press using 5#DB, 17 inch chair, 2x10.   Manual therapy: to reduce pain and tissue tension, improve range of motion, neuromodulation, in order to promote improved ability to complete functional activities. PRONE  - STM to left glute and deep hip rotator region including sustained pressure with and without L hip PROM IR/ER.    Modality: Dry needling performed to left posterior hip to decrease pain and spasms along patient's left buttocks region with patient in prone utilizing 1 dry needle(s) .30mm x with 1 stick at left piriformis region. Patient educated about the risks and benefits from therapy and verbally consents to treatment.  Dry needling performed by Luretha Murphy. [name] PT, DPT who is certified in this technique.   Pt required multimodal cuing for proper technique and to facilitate improved neuromuscular control, strength, range of motion, and functional ability resulting in improved performance and form.   PATIENT EDUCATION:  Education details: Exercise purpose/form. Self  management techniques. Education on diagnosis, prognosis, POC, anatomy and physiology of current condition. Education on dry needling.  Person educated: Patient Education method: Medical illustrator Education comprehension: verbalized understanding and returned demonstration  HOME EXERCISE PROGRAM: Access Code: [email or web address] URL: [email or web address] Date: 12/02/2022 Prepared by: Norton Blizzard  Exercises - Seated Pelvic Floor Elevators  - 1 x daily - 2 sets - 10 reps - Seated Quick Flick Pelvic Floor Contractions  - 1 x daily - 2 sets - 10 reps - Seated Pelvic Floor Contraction  - 1 x daily - 2 sets - 10 reps - Supine Sciatic Nerve Glide  - 1-2 x daily - 7 x weekly - 10 reps - Bird Dog  - 3-5 x weekly - 2 sets - 10 reps - 5 second hold - Neutral Curl Up with Arms Across Chest  - 3-5 x weekly - 5 reps - 30 seconds hold  HOME EXERCISE PROGRAM [[id number]] View at [email or web address]" using code: [id number] SELF TRACTION - LEVEL 3 - H16 -   ASSESSMENT:  CLINICAL IMPRESSION: Patient arrives with soreness in her left glute and less shooting pain. She felt possible increase in soreness in that region after dry needling. Showing improvements in  stability with bird dog, but still loses balance frequently. Able to progress to pallof press and sit <> stand with [facility] press today with good tolerance. Patient would benefit from continued management of limiting condition by skilled physical therapist to address remaining impairments and functional limitations to work towards stated goals and return to PLOF or maximal functional independence.   From Re-Evaluation 11/19/2022:  Patient is a 32 y.o. 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 Patient is  returning to PT more than 90 days since last PT visit to continue care for the same condition as before. She completed 5 physical therapy sessions with improvement in symptoms before her attendance lapsed. She now presents with worsening left LE symptoms and low back and left glute pain since her work environment changed. She had good response to physical therapy intervention in the past and good potential for improvement again with consistent attendance. Patient did demonstrate improvement in trunk endurance at today's goal assessment. Patient presents with significant pain, paresthesia, muscle tension, motor control, muscle performance (strength/power/endurance), and activity tolerance impairments that are limiting ability to complete her usual activities such as working, walking, prolonged standing, bending, lifting, yardwork, housework, sleeping, traveling, prolonged sitting, going to the beach, anything that requires repetitive bending or lifting without difficulty. Patient will benefit from skilled physical therapy intervention to address current body structure impairments and activity limitations to improve function and work towards goals set in current POC in order to return to prior level of function or maximal functional improvement.   OBJECTIVE IMPAIRMENTS: Abnormal gait, decreased activity tolerance, decreased balance, decreased endurance, decreased knowledge of condition, decreased mobility, difficulty walking, decreased ROM, decreased strength, increased fascial restrictions, impaired perceived functional ability, increased muscle spasms, impaired flexibility, improper body mechanics, postural dysfunction, obesity, and pain.   ACTIVITY LIMITATIONS: carrying, lifting, bending, sitting, standing, squatting, stairs, transfers, hygiene/grooming, locomotion level, and caring for others  PARTICIPATION LIMITATIONS: meal prep, cleaning, laundry, driving, shopping, community activity, occupation, yard  work, and   working, walking, prolonged standing, bending, lifting, yardwork, housework, sleeping, traveling, prolonged sitting, going to [employer], anything that requires repetitive bending or lifting   PERSONAL FACTORS: Fitness, Past/current experiences, Time since onset of injury/illness/exacerbation, and 1-2 comorbidities: lateral meniscus tear of left knee; Palpitation; Atypical chest pain; Family history of heart disease; Shortness of breath; and Sinus tachycardia on their problem list. past medical history of Acute meniscal tear of knee, Allergy, Anxiety, Asthma, Atrial fibrillation (HCC), COVID-19 (04/2020), Depression, Ovarian cyst, PVC (premature ventricular contraction), Scoliosis, Shingles, and Tachycardia. past surgical history that includes RIGHT INDEX  FINGER TENDON REPAIR (NOV 2013); Knee arthroscopy with lateral menisectomy (Left, 09/01/2012); and Meniscus repair (Right)  are also affecting patient's functional outcome.   REHAB POTENTIAL: Good  CLINICAL DECISION MAKING: Evolving/moderate complexity  EVALUATION COMPLEXITY: Moderate   GOALS: Goals reviewed with patient? Yes  SHORT TERM GOALS: Target date: 08/07/22. Target date updated to 12/04/2022 on 11/19/2022.   Pt will be independent with initial HEP in order to improve strength and balance in order to decrease fall risk and improve function at home and work. Baseline:HEP given; patient not participating (11/19/2022); participating regularly (12/02/2022);  Goal status: MET   LONG TERM GOALS: Target date: 08/26/22. Target date updated to 02/11/2023 for all unmet goals on 11/19/2022.   Patient will demonstrate  improved FOTO by equal or greater than 10 by visit #17 to demonstrate improvement in overall condition and self-reported functional ability.  Baseline: initial baseline 55. That episode closed due to inactivity. New baseline to be established with new questionnaire to be recorded at visit 7 (11/20/2022);  Goal status:  in-progress  2.  Pt will decrease worst pain as reported on NPRS by at least 3 points in order to demonstrate clinically significant reduction in pain. Baseline: 4/10 (07/01/2022): up to 10/10  (11/19/2022);  Goal status: on-going  3.  Pt will demonstrate plank time of or more to demonstrate age-predicted match of core strength needed for job duties Baseline: 10sec (07/01/2022): 24 seconds (11/19/2022);  Goal status: In-progress  4.  Pt will demonstrate bering sorenson test of 34sec or more to demonstrate clinically significant increase in spinal extensor strength needed for job duties Baseline: 20sec (07/01/2022); 58 seconds (11/19/2022);  Goal status: MET  5.  Patient will be independent with initial home exercise program for self-management of symptoms. Baseline: HEP to be reviewed and updated as needed at visit 7 as appropriate (11/19/22); Goal status: in-progress    PLAN:  PT FREQUENCY: 1-2x/week  PT DURATION: 12 weeks  PLANNED INTERVENTIONS: Therapeutic exercises, Therapeutic activity, Neuromuscular re-education, Balance training, Gait training, Patient/Family education, Self Care, Joint mobilization, Stair training, Dry Needling, Electrical stimulation, Spinal mobilization, Cryotherapy, Moist heat, Manual therapy, and Re-evaluation.  PLAN FOR NEXT SESSION:  update HEP as appropriate, progressive core/LE/functional strengthening, consider repeated motions assessment, education. Manual therapy / dry needling as needed.   Luretha Murphy. [name], PT, DPT 12/11/22, 5:30 PM  [facility] [address] [place], [place] [address] P: [phone] I F: [phone]

## 2022-12-16 ENCOUNTER — Other Ambulatory Visit: Payer: Self-pay

## 2022-12-16 ENCOUNTER — Encounter: Payer: Self-pay | Admitting: Physical Therapy

## 2022-12-16 ENCOUNTER — Ambulatory Visit: Payer: 59 | Admitting: Physical Therapy

## 2022-12-16 ENCOUNTER — Encounter: Payer: Self-pay | Admitting: Cardiovascular Disease

## 2022-12-16 DIAGNOSIS — M792 Neuralgia and neuritis, unspecified: Secondary | ICD-10-CM

## 2022-12-16 DIAGNOSIS — M62838 Other muscle spasm: Secondary | ICD-10-CM | POA: Diagnosis not present

## 2022-12-16 DIAGNOSIS — M5459 Other low back pain: Secondary | ICD-10-CM | POA: Diagnosis not present

## 2022-12-16 MED ORDER — FLUOXETINE HCL 10 MG PO CAPS
10.0000 mg | ORAL_CAPSULE | Freq: Every day | ORAL | 3 refills | Status: DC
  Filled 2022-12-16 – 2023-02-05 (×2): qty 90, 90d supply, fill #0
  Filled 2023-07-07: qty 90, 90d supply, fill #1
  Filled 2023-09-23: qty 90, 90d supply, fill #2

## 2022-12-16 MED ORDER — AMPHETAMINE-DEXTROAMPHET ER 20 MG PO CP24
20.0000 mg | ORAL_CAPSULE | Freq: Every day | ORAL | 0 refills | Status: DC
  Filled 2022-12-16: qty 30, 30d supply, fill #0

## 2022-12-16 NOTE — Therapy (Signed)
OUTPATIENT PHYSICAL THERAPY TREATMENT    Patient Name: Theresa Ortiz MRN: [id number] DOB:[birth year], 32 y.o., female Today's Date: 12/16/2022  END OF SESSION:  PT End of Session - 12/16/22 1738     Visit Number 14    Number of Visits 17    Date for PT Re-Evaluation 02/11/23    Authorization Type [place] [employer] reporting period from 12/02/2022    Authorization Time Period VL: 25 PT/OT combined, Auth req after 25th visit    Authorization - Visit Number 14    Authorization - Number of Visits 25    Progress Note Due on Visit 20    PT Start Time 1738    PT Stop Time 1818    PT Time Calculation (min) 40 min    Activity Tolerance Patient limited by pain    Behavior During Therapy [facility] for tasks assessed/performed             Past Medical History:  Diagnosis Date   Acute meniscal tear of knee    left knee   Allergy    Anxiety    Asthma    Atrial fibrillation (HCC)    slight   COVID-19 04/2020   Depression    Ovarian cyst    PVC (premature ventricular contraction)    Scoliosis    Shingles    Tachycardia    Past Surgical History:  Procedure Laterality Date   KNEE ARTHROSCOPY WITH LATERAL MENISECTOMY Left 09/01/2012   Procedure: LEFT KNEE ARTHROSCOPY WITH LATERAL MENISCECTOMY ;  Surgeon: Jacki Cones, MD;  Location: [facility] ;  Service: Orthopedics;  Laterality: Left;   MENISCUS REPAIR Right    RIGHT INDEX  FINGER TENDON REPAIR  NOV 2013   Patient Active Problem List   Diagnosis Date Noted   Palpitation 07/13/2018   Atypical chest pain 07/13/2018   Family history of heart disease 07/13/2018   Shortness of breath 07/13/2018   Sinus tachycardia 07/13/2018   Acute lateral meniscus tear of left knee 09/01/2012    PCP: Tisovec, Adelfa Koh, MD  REFERRING PROVIDER: Filomena Jungling, MD  REFERRING DIAG: 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 Pt reports she has some pain at the mid calf that is the size of packing tape that feels like a cramp. She reports she has tension at L low back and glute. LBP/glute pain aggravated by lifting/moving patients, and bending forward. Has had L sided sciatic symptoms 1x/week when lifting something heavy down post LLE. She hs previously had pain with prolonged walking, but that has subsided over the past year or so. Has had successful PT in the past with [facility], which she thinks is very helpful. Pt works full time as an Engineer, structural, enjoys traveling and walking her dog. Comorbid conditions as listed above. No falls in past 6 months.  denies N/V, B&B changes, unexplained weight fluctuation, saddle paresthesia, fever, night sweats, or unrelenting night pain at this time. Patient denies hx of cancer, stroke, seizures, diabetes, unexplained weight loss, unexplained changes in bowel or bladder problems, unexplained stumbling or dropping things, osteoporosis, and spinal surgery  SUBJECTIVE:  SUBJECTIVE STATEMENT: Patient states things are feeling good today. She noticed things were a little sore walking in but the pain was closer to her sacrum. She states she was sore in her quads after last PT session.   PAIN:  NPRS: 1/10 left glute along sacrum  PATIENT GOALS: Get the cramp out of my calf, to "eleviate whatever pissed it off this time out of the leg"   OBJECTIVE  TODAY'S TREATMENT: Therapeutic exercise: to centralize symptoms and improve ROM, strength,  muscular endurance, and activity tolerance required for successful completion of functional activities.  - NuStep level 5 using bilateral upper and lower extremities. Seat/handle setting 9/9. For improved extremity mobility, muscular endurance, and activity tolerance; and to induce the analgesic effect of aerobic exercise, stimulate improved joint nutrition, and prepare body structures and systems for following interventions. x 5 minutes. Average SPM = 79. (Manual therapy / dry needling - see below) - Quadruped bird dog (alternating shoulder flexion/contralateral hip extension with core muscles braced), 3x10 each side.  - standing pallof press with 10# cable, 3x10 each side with 1 second hold.  - sit <> stand with OH press using 5#DB, 17 inch chair, 3x10.   Manual therapy: to reduce pain and tissue tension, improve range of motion, neuromodulation, in order to promote improved ability to complete functional activities. PRONE  - STM to left glute and deep hip rotator region including sustained pressure.  Modality: Dry needling performed to left posterior hip to decrease pain and spasms along patient's left buttocks region with patient in prone utilizing 1 dry needle(s) .30mm x with 2 sticks at left piriformis region. Patient educated about the risks and benefits from therapy and verbally consents to treatment.  Dry needling performed by Luretha Murphy. [name] PT, DPT who is certified in this technique.  Pt required multimodal cuing for proper technique and to facilitate improved neuromuscular control, strength, range of motion, and functional ability resulting in improved performance and form.   PATIENT EDUCATION:  Education details: Exercise purpose/form. Self management techniques. Education on diagnosis, prognosis, POC, anatomy and physiology of current condition. Education on dry needling.  Person educated: Patient Education method: Medical illustrator Education comprehension:  verbalized understanding and returned demonstration  HOME EXERCISE PROGRAM: Access Code: [email or web address] URL: [email or web address] Date: 12/02/2022 Prepared by: Norton Blizzard  Exercises - Seated Pelvic Floor Elevators  - 1 x daily - 2 sets - 10 reps - Seated Quick Flick Pelvic Floor Contractions  - 1 x daily - 2 sets - 10 reps - Seated Pelvic Floor Contraction  - 1 x daily - 2 sets - 10 reps - Supine Sciatic Nerve Glide  - 1-2 x daily - 7 x weekly - 10 reps - Bird Dog  - 3-5 x weekly - 2 sets - 10 reps - 5 second hold - Neutral Curl Up with Arms Across Chest  - 3-5 x weekly - 5 reps - 30 seconds hold (progress to dead bug position).  - sit <> stand with buttocks tap on chair to [facility] press with 10-15#DB, 3x10 every other day.   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 Today's session continued with dry needling for pain control and exercises for functional and core strength and motor control. Patient reports soreness in  the left glute after dry needling and manual therapy but tolerated exercises well. Patient would benefit from continued management of limiting condition by skilled physical therapist to address remaining impairments and functional limitations to work towards stated goals and return to PLOF or maximal functional independence.   From Re-Evaluation 11/19/2022:  Patient is a 32 y.o. 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 Patient is returning to PT more than 90 days since last  PT visit to continue care for the same condition as before. She completed 5 physical therapy sessions with improvement in symptoms before her attendance lapsed. She now presents with worsening left LE symptoms and low back and left glute pain since her work environment changed. She had good response to physical therapy intervention in the past and good potential for improvement again with consistent attendance. Patient did demonstrate improvement in trunk endurance at today's goal assessment. Patient presents with significant pain, paresthesia, muscle tension, motor control, muscle performance (strength/power/endurance), and activity tolerance impairments that are limiting ability to complete her usual activities such as working, walking, prolonged standing, bending, lifting, yardwork, housework, sleeping, traveling, prolonged sitting, going to the beach, anything that requires repetitive bending or lifting without difficulty. Patient will benefit from skilled physical therapy intervention to address current body structure impairments and activity limitations to improve function and work towards goals set in current POC in order to return to prior level of function or maximal functional improvement.   OBJECTIVE IMPAIRMENTS: Abnormal gait, decreased activity tolerance, decreased balance, decreased endurance, decreased knowledge of condition, decreased mobility, difficulty walking, decreased ROM, decreased strength, increased fascial restrictions, impaired perceived functional ability, increased muscle spasms, impaired flexibility, improper body mechanics, postural dysfunction, obesity, and pain.   ACTIVITY LIMITATIONS: carrying, lifting, bending, sitting, standing, squatting, stairs, transfers, hygiene/grooming, locomotion level, and caring for others  PARTICIPATION LIMITATIONS: meal prep, cleaning, laundry, driving, shopping, community activity, occupation, yard work, and   working, walking, prolonged  standing, bending, lifting, yardwork, housework, sleeping, traveling, prolonged sitting, going to [employer], anything that requires repetitive bending or lifting   PERSONAL FACTORS: Fitness, Past/current experiences, Time since onset of injury/illness/exacerbation, and 1-2 comorbidities: lateral meniscus tear of left knee; Palpitation; Atypical chest pain; Family history of heart disease; Shortness of breath; and Sinus tachycardia on their problem list. past medical history of Acute meniscal tear of knee, Allergy, Anxiety, Asthma, Atrial fibrillation (HCC), COVID-19 (04/2020), Depression, Ovarian cyst, PVC (premature ventricular contraction), Scoliosis, Shingles, and Tachycardia. past surgical history that includes RIGHT INDEX  FINGER TENDON REPAIR (NOV 2013); Knee arthroscopy with lateral menisectomy (Left, 09/01/2012); and Meniscus repair (Right)  are also affecting patient's functional outcome.   REHAB POTENTIAL: Good  CLINICAL DECISION MAKING: Evolving/moderate complexity  EVALUATION COMPLEXITY: Moderate   GOALS: Goals reviewed with patient? Yes  SHORT TERM GOALS: Target date: 08/07/22. Target date updated to 12/04/2022 on 11/19/2022.   Pt will be independent with initial HEP in order to improve strength and balance in order to decrease fall risk and improve function at home and work. Baseline:HEP given; patient not participating (11/19/2022); participating regularly (12/02/2022);  Goal status: MET   LONG TERM GOALS: Target date: 08/26/22. Target date updated to 02/11/2023 for all unmet goals on 11/19/2022.   Patient will demonstrate improved FOTO by equal or greater than 10 by visit #17 to demonstrate  improvement in overall condition and self-reported functional ability.  Baseline: initial baseline 55. That episode closed due to inactivity. New baseline to be established with new questionnaire to be recorded at visit 7 (11/20/2022);  Goal status: in-progress  2.  Pt will decrease worst pain as  reported on NPRS by at least 3 points in order to demonstrate clinically significant reduction in pain. Baseline: 4/10 (07/01/2022): up to 10/10  (11/19/2022);  Goal status: on-going  3.  Pt will demonstrate plank time of or more to demonstrate age-predicted match of core strength needed for job duties Baseline: 10sec (07/01/2022): 24 seconds (11/19/2022);  Goal status: In-progress  4.  Pt will demonstrate bering sorenson test of 34sec or more to demonstrate clinically significant increase in spinal extensor strength needed for job duties Baseline: 20sec (07/01/2022); 58 seconds (11/19/2022);  Goal status: MET  5.  Patient will be independent with initial home exercise program for self-management of symptoms. Baseline: HEP to be reviewed and updated as needed at visit 7 as appropriate (11/19/22); Goal status: in-progress    PLAN:  PT FREQUENCY: 1-2x/week  PT DURATION: 12 weeks  PLANNED INTERVENTIONS: Therapeutic exercises, Therapeutic activity, Neuromuscular re-education, Balance training, Gait training, Patient/Family education, Self Care, Joint mobilization, Stair training, Dry Needling, Electrical stimulation, Spinal mobilization, Cryotherapy, Moist heat, Manual therapy, and Re-evaluation.  PLAN FOR NEXT SESSION:  update HEP as appropriate, progressive core/LE/functional strengthening, consider repeated motions assessment, education. Manual therapy / dry needling as needed.   Luretha Murphy. [name], PT, DPT 12/16/22, 6:26 PM  [facility] [address] [place], [place] [address] P: [phone] I F: [phone]

## 2022-12-18 ENCOUNTER — Encounter: Payer: Self-pay | Admitting: Physical Therapy

## 2022-12-18 ENCOUNTER — Ambulatory Visit: Payer: 59 | Admitting: Physical Therapy

## 2022-12-18 DIAGNOSIS — M62838 Other muscle spasm: Secondary | ICD-10-CM | POA: Diagnosis not present

## 2022-12-18 DIAGNOSIS — M792 Neuralgia and neuritis, unspecified: Secondary | ICD-10-CM | POA: Diagnosis not present

## 2022-12-18 DIAGNOSIS — M5459 Other low back pain: Secondary | ICD-10-CM

## 2022-12-18 NOTE — Therapy (Signed)
OUTPATIENT PHYSICAL THERAPY TREATMENT    Patient Name: Theresa Ortiz MRN: [id number] DOB:[birth year], 32 y.o., female Today's Date: 12/18/2022  END OF SESSION:  PT End of Session - 12/18/22 1741     Visit Number 15    Number of Visits 17    Date for PT Re-Evaluation 02/11/23    Authorization Type [place] [employer] reporting period from 12/02/2022    Authorization Time Period VL: 25 PT/OT combined, Auth req after 25th visit    Authorization - Visit Number 15    Authorization - Number of Visits 25    Progress Note Due on Visit 20    PT Start Time 1737    PT Stop Time 1815    PT Time Calculation (min) 38 min    Activity Tolerance Patient limited by pain    Behavior During Therapy [facility] for tasks assessed/performed              Past Medical History:  Diagnosis Date   Acute meniscal tear of knee    left knee   Allergy    Anxiety    Asthma    Atrial fibrillation (HCC)    slight   COVID-19 04/2020   Depression    Ovarian cyst    PVC (premature ventricular contraction)    Scoliosis    Shingles    Tachycardia    Past Surgical History:  Procedure Laterality Date   KNEE ARTHROSCOPY WITH LATERAL MENISECTOMY Left 09/01/2012   Procedure: LEFT KNEE ARTHROSCOPY WITH LATERAL MENISCECTOMY ;  Surgeon: Jacki Cones, MD;  Location: [facility];  Service: Orthopedics;  Laterality: Left;   MENISCUS REPAIR Right    RIGHT INDEX  FINGER TENDON REPAIR  NOV 2013   Patient Active Problem List   Diagnosis Date Noted   Palpitation 07/13/2018   Atypical chest pain 07/13/2018   Family history of heart disease 07/13/2018   Shortness of breath 07/13/2018   Sinus tachycardia 07/13/2018   Acute lateral meniscus tear of left knee 09/01/2012    PCP: Tisovec, Adelfa Koh, MD  REFERRING PROVIDER: Filomena Jungling, MD  REFERRING DIAG: 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 Pt reports she has some pain at the mid calf that is the size of packing tape that feels like a cramp. She reports she has tension at L low back and glute. LBP/glute pain aggravated by lifting/moving patients, and bending forward. Has had L sided sciatic symptoms 1x/week when lifting something heavy down post LLE. She hs previously had pain with prolonged walking, but that has subsided over the past year or so. Has had successful PT in the past with [facility], which she thinks is very helpful. Pt works full time as an Engineer, structural, enjoys traveling and walking her dog. Comorbid conditions as listed above. No falls in past 6 months.  denies N/V, B&B changes, unexplained weight fluctuation, saddle paresthesia, fever, night sweats, or unrelenting night pain at this time. Patient denies hx of cancer, stroke, seizures, diabetes, unexplained weight loss, unexplained changes in bowel or bladder problems, unexplained stumbling or dropping things, osteoporosis, and spinal surgery  SUBJECTIVE:  SUBJECTIVE STATEMENT: Patient states she is feeling pretty good today. She had a tingling sensation at the bottom of her foot today for 5 minutes when she was pushing a stretcher.   PAIN:  NPRS: 0.5/10 tender and sore in left glute, back feels fine, left calf feels the same.   PATIENT GOALS: Get the cramp out of my calf, to "eleviate whatever pissed it off this time out of the leg"   OBJECTIVE  TODAY'S TREATMENT: Therapeutic exercise: to centralize symptoms and improve ROM,  strength, muscular endurance, and activity tolerance required for successful completion of functional activities.  - NuStep level 5 using bilateral upper and lower extremities. Seat/handle setting 9/9. For improved extremity mobility, muscular endurance, and activity tolerance; and to induce the analgesic effect of aerobic exercise, stimulate improved joint nutrition, and prepare body structures and systems for following interventions. x 6 minutes. Average SPM = 79.  - Quadruped bird dog (alternating shoulder flexion/contralateral hip extension with core muscles braced), cone on sacrum to encourage improved core stability, 3x10 each side.  - hooklying curl up with alternating heel taps form 90/90 position, 2x5 each side,1x10 each side plus additional repetitions to learn exercise and improve form and breathing. Cued for inhale with heel tap and exhale  while brining leg back up.  - education on RPE and RIR in relationship to exercise with repetitions.  -  Education on HEP including handout   Pt required multimodal cuing for proper technique and to facilitate improved neuromuscular control, strength, range of motion, and functional ability resulting in improved performance and form.   PATIENT EDUCATION:  Education details: Exercise purpose/form. Self management techniques. Education on diagnosis, prognosis, POC, anatomy and physiology of current condition. Education on dry needling.  Person educated: Patient Education method: Medical illustrator Education comprehension: verbalized understanding and returned demonstration  HOME EXERCISE PROGRAM: Access Code: [email or web address] URL: [email or web address] Date: 12/18/2022 Prepared by: Norton Blizzard  Exercises - Seated Pelvic Floor Elevators  - 1 x daily - 2 sets - 10 reps - Seated Quick Flick Pelvic Floor Contractions  - 1 x daily - 2 sets - 10 reps - Seated Pelvic Floor Contraction  - 1 x daily - 2 sets - 10 reps - Supine Sciatic  Nerve Glide  - 1-2 x daily - 7 x weekly - 10 reps - Bird Dog  - 3 x weekly - 2 sets - 10 reps - 5 second hold - Dead Bug  - 3 x weekly - 3 sets - 5-10 reps - Barbell Thruster  - 3 x weekly - 3 sets - 10 reps   HOME EXERCISE PROGRAM [[email or web address]] View at [email or web address]" using code: [id number] SELF TRACTION - LEVEL 3 - H16 -   ASSESSMENT:  CLINICAL IMPRESSION: Patient arrives with generalized soreness and specific soreness in left glute. Today's session focused on core stability and strengthening exercises with patient progressing bird dogs and curl up with good tolerance. She reported mild increase in low back discomfort by end of session after taking curl up with heel tapping to fatigue. Planing to reduce PT visit frequency to once a week in September to promote independence and work towards discharge. Patient would benefit from continued management of limiting condition by skilled physical therapist to address remaining impairments and functional limitations to work towards stated goals and return to PLOF or maximal functional independence.   From Re-Evaluation 11/19/2022:  Patient is a 32 y.o. female referred to outpatient physical therapy with a medical diagnosis of chronic bilateral lower back  pain with bilateral sciatica, thoracic spine pain who presents with signs and symptoms consistent with low back pain with left sided sciatica, possibly with piriformis syndrome. Patient is returning to PT more than 90 days since last PT visit to continue care for the same condition as before. She completed 5 physical therapy sessions with improvement in symptoms before her attendance lapsed. She now presents with worsening left LE symptoms and low back and left glute pain since her work environment changed. She had good response to physical therapy intervention in the past and good potential for improvement again with consistent attendance. Patient did demonstrate improvement in trunk endurance at today's  goal assessment. Patient presents with significant pain, paresthesia, muscle tension, motor control, muscle performance (strength/power/endurance), and activity tolerance impairments that are limiting ability to complete her usual activities such as working, walking, prolonged standing, bending, lifting, yardwork, housework, sleeping, traveling, prolonged sitting, going to the beach, anything that requires repetitive bending or lifting without difficulty. Patient will benefit from skilled physical therapy intervention to address current body structure impairments and activity limitations to improve function and work towards goals set in current POC in order to return to prior level of function or maximal functional improvement.   OBJECTIVE IMPAIRMENTS: Abnormal gait, decreased activity tolerance, decreased balance, decreased endurance, decreased knowledge of condition, decreased mobility, difficulty walking, decreased ROM, decreased strength, increased fascial restrictions, impaired perceived functional ability, increased muscle spasms, impaired flexibility, improper body mechanics, postural dysfunction, obesity, and pain.   ACTIVITY LIMITATIONS: carrying, lifting, bending, sitting, standing, squatting, stairs, transfers, hygiene/grooming, locomotion level, and caring for others  PARTICIPATION LIMITATIONS: meal prep, cleaning, laundry, driving, shopping, community activity, occupation, yard work, and   working, walking, prolonged standing, bending, lifting, yardwork, housework, sleeping, traveling, prolonged sitting, going to [employer], anything that requires repetitive bending or lifting   PERSONAL FACTORS: Fitness, Past/current experiences, Time since onset of injury/illness/exacerbation, and 1-2 comorbidities: lateral meniscus tear of left knee; Palpitation; Atypical chest pain; Family history of heart disease; Shortness of breath; and Sinus tachycardia on their problem list. past medical history of Acute  meniscal tear of knee, Allergy, Anxiety, Asthma, Atrial fibrillation (HCC), COVID-19 (04/2020), Depression, Ovarian cyst, PVC (premature ventricular contraction), Scoliosis, Shingles, and Tachycardia. past surgical history that includes RIGHT INDEX  FINGER TENDON REPAIR (NOV 2013); Knee arthroscopy with lateral menisectomy (Left, 09/01/2012); and Meniscus repair (Right)  are also affecting patient's functional outcome.   REHAB POTENTIAL: Good  CLINICAL DECISION MAKING: Evolving/moderate complexity  EVALUATION COMPLEXITY: Moderate   GOALS: Goals reviewed with patient? Yes  SHORT TERM GOALS: Target date: 08/07/22. Target date updated to 12/04/2022 on 11/19/2022.   Pt will be independent with initial HEP in order to improve strength and balance in order to decrease fall risk and improve function at home and work. Baseline:HEP given; patient not participating (11/19/2022); participating regularly (12/02/2022);  Goal status: MET   LONG TERM GOALS: Target date: 08/26/22. Target date updated to 02/11/2023 for all unmet goals on 11/19/2022.   Patient will demonstrate improved FOTO by equal or greater than 10 by visit #17 to demonstrate improvement in overall condition and self-reported functional ability.  Baseline: initial baseline 55. That episode closed due to inactivity. New baseline to be established with new questionnaire to be recorded at visit 7 (11/20/2022);  Goal status: in-progress  2.  Pt will decrease worst pain as reported on NPRS by at least 3 points in order to demonstrate clinically significant reduction in pain. Baseline: 4/10 (07/01/2022): up to 10/10  (11/19/2022);  Goal status: on-going  3.  Pt will demonstrate plank time of or more to demonstrate age-predicted match of core strength needed for job duties Baseline: 10sec (07/01/2022): 24 seconds (11/19/2022);  Goal status: In-progress  4.  Pt will demonstrate bering sorenson test of 34sec or more to demonstrate clinically significant  increase in spinal extensor strength needed for job duties Baseline: 20sec (07/01/2022); 58 seconds (11/19/2022);  Goal status: MET  5.  Patient will be independent with initial home exercise program for self-management of symptoms. Baseline: HEP to be reviewed and updated as needed at visit 7 as appropriate (11/19/22); Goal status: in-progress    PLAN:  PT FREQUENCY: 1-2x/week  PT DURATION: 12 weeks  PLANNED INTERVENTIONS: Therapeutic exercises, Therapeutic activity, Neuromuscular re-education, Balance training, Gait training, Patient/Family education, Self Care, Joint mobilization, Stair training, Dry Needling, Electrical stimulation, Spinal mobilization, Cryotherapy, Moist heat, Manual therapy, and Re-evaluation.  PLAN FOR NEXT SESSION:  update HEP as appropriate, progressive core/LE/functional strengthening, consider repeated motions assessment, education. Manual therapy / dry needling as needed.   Luretha Murphy. [name], PT, DPT 12/18/22, 6:44 PM  [facility] [address] [place], [place] [address] P: [phone] I F: [phone]

## 2022-12-19 ENCOUNTER — Other Ambulatory Visit: Payer: Self-pay

## 2022-12-23 ENCOUNTER — Ambulatory Visit: Payer: 59 | Admitting: Physical Therapy

## 2022-12-23 ENCOUNTER — Encounter: Payer: Self-pay | Admitting: Physical Therapy

## 2022-12-23 DIAGNOSIS — M792 Neuralgia and neuritis, unspecified: Secondary | ICD-10-CM

## 2022-12-23 DIAGNOSIS — M62838 Other muscle spasm: Secondary | ICD-10-CM

## 2022-12-23 DIAGNOSIS — M5459 Other low back pain: Secondary | ICD-10-CM | POA: Diagnosis not present

## 2022-12-23 NOTE — Therapy (Signed)
OUTPATIENT PHYSICAL THERAPY TREATMENT    Patient Name: Theresa Ortiz MRN: [id number] DOB:[birth year], 32 y.o., female Today's Date: 12/23/2022  END OF SESSION:  PT End of Session - 12/23/22 1820     Visit Number 16    Number of Visits 17    Date for PT Re-Evaluation 02/11/23    Authorization Type [place] [employer] FOCUS reporting period from 12/02/2022    Authorization Time Period VL: 25 PT/OT combined, Auth req after 25th visit    Authorization - Visit Number 16    Authorization - Number of Visits 25    Progress Note Due on Visit 20    PT Start Time 1815    PT Stop Time 1855    PT Time Calculation (min) 40 min    Activity Tolerance Patient limited by pain    Behavior During Therapy [facility] for tasks assessed/performed             Past Medical History:  Diagnosis Date   Acute meniscal tear of knee    left knee   Allergy    Anxiety    Asthma    Atrial fibrillation (HCC)    slight   COVID-19 04/2020   Depression    Ovarian cyst    PVC (premature ventricular contraction)    Scoliosis    Shingles    Tachycardia    Past Surgical History:  Procedure Laterality Date   KNEE ARTHROSCOPY WITH LATERAL MENISECTOMY Left 09/01/2012   Procedure: LEFT KNEE ARTHROSCOPY WITH LATERAL MENISCECTOMY ;  Surgeon: Jacki Cones, MD;  Location: [facility];  Service: Orthopedics;  Laterality: Left;   MENISCUS REPAIR Right    RIGHT INDEX  FINGER TENDON REPAIR  NOV 2013   Patient Active Problem List   Diagnosis Date Noted   Palpitation 07/13/2018   Atypical chest pain 07/13/2018   Family history of heart disease 07/13/2018   Shortness of breath 07/13/2018   Sinus tachycardia 07/13/2018   Acute lateral meniscus tear of left knee 09/01/2012    PCP: Tisovec, Adelfa Koh, MD  REFERRING PROVIDER: Filomena Jungling, MD  REFERRING DIAG: 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 Pt reports she has some pain at the mid calf that is the size of packing tape that feels like a cramp. She reports she has tension at L low back and glute. LBP/glute pain aggravated by lifting/moving patients, and bending forward. Has had L sided sciatic symptoms 1x/week when lifting something heavy down post LLE. She hs previously had pain with prolonged walking, but that has subsided over the past year or so. Has had successful PT in the past with [facility], which she thinks is very helpful. Pt works full time as an Engineer, structural, enjoys traveling and walking her dog. Comorbid conditions as listed above. No falls in past 6 months.  denies N/V, B&B changes, unexplained weight fluctuation, saddle paresthesia, fever, night sweats, or unrelenting night pain at this time. Patient denies hx of cancer, stroke, seizures, diabetes, unexplained weight loss, unexplained changes in bowel or bladder problems, unexplained stumbling or dropping things, osteoporosis, and spinal surgery  SUBJECTIVE:  SUBJECTIVE STATEMENT: Patient states she was feeling pretty good until today when she developed burning pain in the right top of her glute near her sacrum after a hard day at work where they had to move a lot of patients. She moved at 400lb and a 300lb patient today at work in the morning but the symptoms did not start until after lunch when she was moving more patients. The odd sensation in her left calf has been a bit more prominent and sharper in the medial aspect of her lower leg  prior to today but it is not there when she arrived. She states the sit <> stand with overhead press exercise is still challenging her and causing soreness throughout the working parts of her body but not exacerbating her pain.   PAIN:  NPRS: 4/10 tender and sore in left glute, back feels fine, left calf feels the same.   PATIENT GOALS: Get the cramp out of my calf, to "eleviate whatever pissed it off this time out of the leg"   OBJECTIVE  TODAY'S TREATMENT: Therapeutic exercise: to centralize symptoms and improve ROM, strength, muscular endurance, and activity tolerance required for successful completion of functional activities.  - standing lumbar extension against table, 2x10 possible slight change in right sided burning, but also feeling "weaker" at the low back.  - NuStep level 6 using bilateral upper and lower extremities. Seat/handle setting 9/9. For improved extremity mobility, muscular endurance, and activity tolerance; and to induce the analgesic effect of aerobic exercise, stimulate improved joint nutrition, and prepare body structures and systems for following interventions. x 5 minutes. Average SPM = 68.  - Quadruped bird dog (alternating shoulder flexion/contralateral hip extension with core muscles braced), cone on sacrum to encourage improved core stability, 2x10 each side (right LE feels heavier than left).  - side plank elbows to knees, 5x10 seconds each side (also added clamshell to some, but ultimately decided that it was better without clams).  -  Education on HEP including handout   Manual therapy: to reduce pain and tissue tension, improve range of motion, neuromodulation, in order to promote improved ability to complete functional activities. PRONE - STM to bilateral lumbar paraspinals  - Modality: Dry needling performed to lumbar spine to decrease pain and spasms along patient's low back and buttock region with patient in prone utilizing 4 dry needle(s) .30mm x  with 1 stick at each side at each level at L4-L5 multifidi . Patient educated about the risks and benefits from therapy and verbally consents to treatment.  Dry needling performed by Luretha Murphy. [name] PT, DPT who is certified in this technique.  Pt required multimodal cuing for proper technique and to facilitate improved neuromuscular control, strength, range of motion, and functional ability resulting in improved performance and form.   PATIENT EDUCATION:  Education details: Exercise purpose/form. Self management techniques. Education on diagnosis, prognosis, POC, anatomy and physiology of current condition. Education on dry needling.  Person educated: Patient Education method: Medical illustrator Education comprehension: verbalized understanding and returned demonstration  HOME EXERCISE PROGRAM: Access Code: [email or web address] URL: [email or web address] Date: 12/23/2022 Prepared by: Norton Blizzard  Exercises - Seated Pelvic Floor Elevators  - 1 x daily - 2 sets - 10 reps - Seated Quick Flick Pelvic Floor Contractions  - 1 x daily - 2 sets - 10 reps - Seated Pelvic Floor Contraction  - 1 x daily - 2 sets - 10 reps - Supine Sciatic Nerve Glide  - 1-2 x daily - 7  x weekly - 10 reps - Bird Dog  - 3 x weekly - 2 sets - 10 reps - 5 second hold - Dead Bug  - 3 x weekly - 3 sets - 5-10 reps - Barbell Thruster  - 3 x weekly - 3 sets - 10 reps - Side Plank on Knees  - 3 x weekly - 5 sets - 10 seconds hold  HOME EXERCISE PROGRAM [[email or web address]] View at [email or web address]" using code: [email or web address] SELF TRACTION - LEVEL 3 - H16 -   ASSESSMENT:  CLINICAL IMPRESSION: Patient arrives with new burning pain at right buttock near sacrum. Utilized dry needling to attempt to relieve this pain with no improvement by end of session. Continued with core strengthening with progression to lateral trunk with side bridges. Patient struggled with strength and endurance but with not pain during exercises. She  reported mild increase in right sided burning sensation by end of session. Plan to continue with core/functional strengthening and utilize dry needling as needed for pain control next session. Patient would benefit from continued management of limiting condition by skilled physical therapist to address remaining impairments and functional limitations to work towards stated goals and return to PLOF or maximal functional independence.   From Re-Evaluation 11/19/2022:  Patient is a 32 y.o. 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 Patient is returning to PT more than 90 days since last PT visit to continue care for the same condition as before. She completed 5 physical therapy sessions with improvement in symptoms before her attendance lapsed. She now presents with worsening left LE symptoms and low back and left glute pain since her work environment changed. She had good response to physical therapy intervention in the past and good potential for improvement again with consistent attendance. Patient did demonstrate improvement in trunk endurance at today's goal assessment. Patient presents with significant pain, paresthesia, muscle tension, motor control, muscle performance (strength/power/endurance), and activity tolerance impairments that are limiting ability to complete her usual activities such as working, walking, prolonged standing, bending, lifting, yardwork, housework, sleeping, traveling, prolonged sitting, going to the beach, anything that requires repetitive bending or lifting without difficulty. Patient will benefit from skilled physical therapy intervention to address current body structure impairments and activity limitations to improve function and work towards  goals set in current POC in order to return to prior level of function or maximal functional improvement.   OBJECTIVE IMPAIRMENTS: Abnormal gait, decreased activity tolerance, decreased balance, decreased endurance, decreased knowledge of condition, decreased mobility, difficulty walking, decreased ROM, decreased strength, increased fascial restrictions, impaired perceived functional ability, increased muscle spasms, impaired flexibility, improper body mechanics, postural dysfunction, obesity, and pain.   ACTIVITY LIMITATIONS: carrying, lifting, bending, sitting, standing, squatting, stairs, transfers, hygiene/grooming, locomotion level, and caring for others  PARTICIPATION LIMITATIONS: meal prep, cleaning, laundry, driving, shopping, community activity, occupation, yard work, and   working, walking, prolonged standing, bending, lifting, yardwork, housework, sleeping, traveling, prolonged sitting, going to [employer], anything that requires repetitive bending or lifting   PERSONAL FACTORS: Fitness, Past/current experiences, Time since onset of injury/illness/exacerbation, and 1-2 comorbidities: lateral meniscus tear of left knee; Palpitation; Atypical chest pain; Family history of heart disease; Shortness of breath; and Sinus tachycardia on their problem list. past medical history of Acute meniscal tear of knee, Allergy, Anxiety, Asthma, Atrial fibrillation (  HCC), COVID-19 (04/2020), Depression, Ovarian cyst, PVC (premature ventricular contraction), Scoliosis, Shingles, and Tachycardia. past surgical history that includes RIGHT INDEX  FINGER TENDON REPAIR (NOV 2013); Knee arthroscopy with lateral menisectomy (Left, 09/01/2012); and Meniscus repair (Right)  are also affecting patient's functional outcome.   REHAB POTENTIAL: Good  CLINICAL DECISION MAKING: Evolving/moderate complexity  EVALUATION COMPLEXITY: Moderate   GOALS: Goals reviewed with patient? Yes  SHORT TERM GOALS: Target date: 08/07/22.  Target date updated to 12/04/2022 on 11/19/2022.   Pt will be independent with initial HEP in order to improve strength and balance in order to decrease fall risk and improve function at home and work. Baseline:HEP given; patient not participating (11/19/2022); participating regularly (12/02/2022);  Goal status: MET   LONG TERM GOALS: Target date: 08/26/22. Target date updated to 02/11/2023 for all unmet goals on 11/19/2022.   Patient will demonstrate improved FOTO by equal or greater than 10 by visit #17 to demonstrate improvement in overall condition and self-reported functional ability.  Baseline: initial baseline 55. That episode closed due to inactivity. New baseline to be established with new questionnaire to be recorded at visit 7 (11/20/2022);  Goal status: in-progress  2.  Pt will decrease worst pain as reported on NPRS by at least 3 points in order to demonstrate clinically significant reduction in pain. Baseline: 4/10 (07/01/2022): up to 10/10  (11/19/2022);  Goal status: on-going  3.  Pt will demonstrate plank time of or more to demonstrate age-predicted match of core strength needed for job duties Baseline: 10sec (07/01/2022): 24 seconds (11/19/2022);  Goal status: In-progress  4.  Pt will demonstrate bering sorenson test of 34sec or more to demonstrate clinically significant increase in spinal extensor strength needed for job duties Baseline: 20sec (07/01/2022); 58 seconds (11/19/2022);  Goal status: MET  5.  Patient will be independent with initial home exercise program for self-management of symptoms. Baseline: HEP to be reviewed and updated as needed at visit 7 as appropriate (11/19/22); Goal status: in-progress    PLAN:  PT FREQUENCY: 1-2x/week  PT DURATION: 12 weeks  PLANNED INTERVENTIONS: Therapeutic exercises, Therapeutic activity, Neuromuscular re-education, Balance training, Gait training, Patient/Family education, Self Care, Joint mobilization, Stair training, Dry  Needling, Electrical stimulation, Spinal mobilization, Cryotherapy, Moist heat, Manual therapy, and Re-evaluation.  PLAN FOR NEXT SESSION:  update HEP as appropriate, progressive core/LE/functional strengthening, consider repeated motions assessment, education. Manual therapy / dry needling as needed.   Luretha Murphy. [name], PT, DPT 12/23/22, 7:59 PM  [facility] [address] [place], [place] [address] P: [phone] I F: [phone]

## 2022-12-25 ENCOUNTER — Telehealth: Payer: Self-pay | Admitting: Physical Therapy

## 2022-12-25 ENCOUNTER — Ambulatory Visit: Payer: 59 | Admitting: Physical Therapy

## 2022-12-25 NOTE — Telephone Encounter (Signed)
Called patient when she did not show up for her 5:30pm PT appointment scheduled today. She was profusely apologetic and said she got home and was doing housework and it slipped her mind even though she looked at her schedule 3 hours ago. She was able to reschedule to tomorrow at 4:45pm.   Theresa Ortiz. [name], PT, DPT 12/25/22, 6:05 PM  [facility] [address] [place], [place] [address] P: [phone] I F: [phone]

## 2022-12-26 ENCOUNTER — Encounter: Payer: Self-pay | Admitting: Physical Therapy

## 2022-12-26 ENCOUNTER — Ambulatory Visit: Payer: 59 | Admitting: Physical Therapy

## 2022-12-26 DIAGNOSIS — M5459 Other low back pain: Secondary | ICD-10-CM | POA: Diagnosis not present

## 2022-12-26 DIAGNOSIS — M62838 Other muscle spasm: Secondary | ICD-10-CM

## 2022-12-26 DIAGNOSIS — M792 Neuralgia and neuritis, unspecified: Secondary | ICD-10-CM | POA: Diagnosis not present

## 2022-12-26 NOTE — Therapy (Signed)
OUTPATIENT PHYSICAL THERAPY TREATMENT / PROGRESS NOTE Dates of reporting from 12/02/2022 to 12/26/2022   Patient Name: Theresa Ortiz MRN: [id number] DOB:13-Aug-1990, 32 y.o., female Today's Date: 12/26/2022  END OF SESSION:  PT End of Session - 12/26/22 1651     Visit Number 17    Number of Visits 25    Date for PT Re-Evaluation 02/11/23    Authorization Type [facility] [employer] FOCUS reporting period from 12/02/2022    Authorization Time Period VL: 25 PT/OT combined, Auth req after 25th visit    Authorization - Visit Number 17    Authorization - Number of Visits 25    Progress Note Due on Visit 20    PT Start Time 1651    PT Stop Time 1729    PT Time Calculation (min) 38 min    Activity Tolerance Patient limited by pain;Patient tolerated treatment well    Behavior During Therapy [facility] for tasks assessed/performed             Past Medical History:  Diagnosis Date   Acute meniscal tear of knee    left knee   Allergy    Anxiety    Asthma    Atrial fibrillation (HCC)    slight   COVID-19 04/2020   Depression    Ovarian cyst    PVC (premature ventricular contraction)    Scoliosis    Shingles    Tachycardia    Past Surgical History:  Procedure Laterality Date   KNEE ARTHROSCOPY WITH LATERAL MENISECTOMY Left 09/01/2012   Procedure: LEFT KNEE ARTHROSCOPY WITH LATERAL MENISCECTOMY ;  Surgeon: Jacki Cones, MD;  Location: [facility];  Service: Orthopedics;  Laterality: Left;   MENISCUS REPAIR Right    RIGHT INDEX  FINGER TENDON REPAIR  NOV 2013   Patient Active Problem List   Diagnosis Date Noted   Palpitation 07/13/2018   Atypical chest pain 07/13/2018   Family history of heart disease 07/13/2018   Shortness of breath 07/13/2018   Sinus tachycardia 07/13/2018   Acute lateral meniscus tear of left knee 09/01/2012    PCP: Tisovec, Adelfa Koh, MD  REFERRING PROVIDER: Filomena Jungling, MD  REFERRING DIAG: 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 Pt reports she has some pain at the mid calf that is the size of packing tape that feels like a cramp. She reports she has tension at L low back and glute. LBP/glute pain aggravated by lifting/moving patients, and bending forward. Has had L sided sciatic symptoms 1x/week when lifting something heavy down post LLE. She hs previously had pain with prolonged walking, but that has subsided over the past year or so. Has had successful PT in the past with [facility], which she thinks is very helpful. Pt works full time as an Engineer, structural, enjoys traveling and walking her dog. Comorbid conditions as listed above. No falls in past 6 months.  denies N/V, B&B changes, unexplained weight fluctuation, saddle paresthesia, fever, night sweats, or unrelenting night pain at this time. Patient denies hx of cancer, stroke, seizures, diabetes, unexplained weight loss, unexplained changes in bowel or bladder problems, unexplained stumbling or  dropping things, osteoporosis, and spinal surgery  SUBJECTIVE:                                                                                                                                                                                           SUBJECTIVE STATEMENT: Patient states she definitely feels like PT is helping her at work. Before if she helped move on patient her symptoms would get "pissed off" for a couple of days whereas now she can move a couple patients before her body starts to tell her it is too much and then she often feels better again in 30 min or so.   PAIN:  NPRS: 4/10 tender and sore in left glute  PATIENT GOALS: Get  the cramp out of my calf, to "eleviate whatever pissed it off this time out of the leg"   OBJECTIVE  SELF-REPORTED FUNCTION FOTO score: 60/100 (lumbar spine questionnaire)  FUNCTIONAL TESTS:  Plank test 23sec, back "wanted to cave" within first 5 seconds.   TODAY'S TREATMENT: Therapeutic exercise: to centralize symptoms and improve ROM, strength, muscular endurance, and activity tolerance required for successful completion of functional activities.  - NuStep level 6 using bilateral upper and lower extremities. Seat/handle setting 9/9. For improved extremity mobility, muscular endurance, and activity tolerance; and to induce the analgesic effect of aerobic exercise, stimulate improved joint nutrition, and prepare body structures and systems for following interventions. x 7 minutes. Average SPM = 75.  - testing to assess progress (see above).  (Manual therapy - dry needling) - trial of various ways to self-mobilize soft tissue of L deep hip rotators including trial of wall and floor with LAX ball, foam roller, and with theracane without good tolerance.  - seated L piriformis stretch in figure 4 position with hip flexion, (most intense stretch when trunk leans towards foot). Unable to achieve effective stretch in supine or sidelying with other variations attempted.  -  Education on HEP including handout   Manual therapy: to reduce pain and tissue tension, improve range of motion, neuromodulation, in order to promote improved ability to complete functional activities. PRONE - STM to left deep hip rotators/glute  - Modality: Dry needling performed to left deep hip rotators to decrease pain and spasms along patient's left buttock region with patient in prone utilizing 1 dry needle(s) .30mm x with 1 stick at left piriformis region with multiple twitches noted. Patient educated about the risks and benefits from therapy and verbally consents to treatment.  Dry needling performed by Luretha Murphy.  [name] PT, DPT who is certified in this technique.  Pt required multimodal cuing for proper technique and to  facilitate improved neuromuscular control, strength, range of motion, and functional ability resulting in improved performance and form.   PATIENT EDUCATION:  Education details: Exercise purpose/form. Self management techniques. Education on diagnosis, prognosis, POC, anatomy and physiology of current condition. Education on dry needling.  Person educated: Patient Education method: Medical illustrator Education comprehension: verbalized understanding and returned demonstration  HOME EXERCISE PROGRAM: Access Code: [email or web address] URL: [email or web address] Date: 12/26/2022 Prepared by: Norton Blizzard  Exercises - Seated Pelvic Floor Elevators  - 1 x daily - 2 sets - 10 reps - Seated Quick Flick Pelvic Floor Contractions  - 1 x daily - 2 sets - 10 reps - Seated Pelvic Floor Contraction  - 1 x daily - 2 sets - 10 reps - Supine Sciatic Nerve Glide  - 1-2 x daily - 7 x weekly - 10 reps - Bird Dog  - 3 x weekly - 2 sets - 10 reps - 5 second hold - Dead Bug  - 3 x weekly - 3 sets - 5-10 reps - Barbell Thruster  - 3 x weekly - 3 sets - 10 reps - Side Plank on Knees  - 3 x weekly - 5 sets - 10 seconds hold - Standard Plank  - 3 x weekly - 5 sets - 15 seconds hold - Seated Piriformis Stretch with Trunk Bend  - 3 x weekly - 3-5 reps - 10-30 seconds hold  HOME EXERCISE PROGRAM [[id number]] View at [email or web address]" using code: [id number] SELF TRACTION - LEVEL 3 - H16 -   ASSESSMENT:  CLINICAL IMPRESSION: Patient has attended 17 physical therapy sessions and made overall progress towards all of her goals since initial evaluation, but she has not improved her plank time since last progress note. She would therefore would benefit from more targeted strengthening of plank position, which was updated in her HEP. Patient has continued to benefit from pain control with dry  needling and reports and demonstrates improved activity tolerance and strength in the clinic and at work. She does continue to struggle with feelings of weakness in the low back and pain in the R > L glute and calf. Plan to continue with PT with goal of gradually transitioning to independent management with long term HEP. Patient would benefit from continued management of limiting condition by skilled physical therapist to address remaining impairments and functional limitations to work towards stated goals and return to PLOF or maximal functional independence.  From Re-Evaluation 11/19/2022:  Patient is a 32 y.o. 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 Patient is returning to PT more than 90 days since last PT visit to continue care for the same condition as before. She completed 5 physical therapy sessions with improvement in symptoms before her attendance lapsed. She now presents with worsening left LE symptoms and low back and left glute pain since her work environment changed. She had good response to physical therapy intervention in the past and good potential for improvement again with consistent attendance. Patient did demonstrate improvement in trunk endurance at today's goal assessment. Patient presents with significant pain, paresthesia, muscle tension, motor control, muscle performance (strength/power/endurance), and activity tolerance impairments that are limiting ability to complete her usual activities such as working, walking, prolonged standing, bending, lifting, yardwork, housework, sleeping, traveling, prolonged sitting, going to the beach, anything that requires  repetitive bending or lifting without difficulty. Patient will benefit from skilled physical  therapy intervention to address current body structure impairments and activity limitations to improve function and work towards goals set in current POC in order to return to prior level of function or maximal functional improvement.   OBJECTIVE IMPAIRMENTS: Abnormal gait, decreased activity tolerance, decreased balance, decreased endurance, decreased knowledge of condition, decreased mobility, difficulty walking, decreased ROM, decreased strength, increased fascial restrictions, impaired perceived functional ability, increased muscle spasms, impaired flexibility, improper body mechanics, postural dysfunction, obesity, and pain.   ACTIVITY LIMITATIONS: carrying, lifting, bending, sitting, standing, squatting, stairs, transfers, hygiene/grooming, locomotion level, and caring for others  PARTICIPATION LIMITATIONS: meal prep, cleaning, laundry, driving, shopping, community activity, occupation, yard work, and   working, walking, prolonged standing, bending, lifting, yardwork, housework, sleeping, traveling, prolonged sitting, going to [employer], anything that requires repetitive bending or lifting   PERSONAL FACTORS: Fitness, Past/current experiences, Time since onset of injury/illness/exacerbation, and 1-2 comorbidities: lateral meniscus tear of left knee; Palpitation; Atypical chest pain; Family history of heart disease; Shortness of breath; and Sinus tachycardia on their problem list. past medical history of Acute meniscal tear of knee, Allergy, Anxiety, Asthma, Atrial fibrillation (HCC), COVID-19 (04/2020), Depression, Ovarian cyst, PVC (premature ventricular contraction), Scoliosis, Shingles, and Tachycardia. past surgical history that includes RIGHT INDEX  FINGER TENDON REPAIR (NOV 2013); Knee arthroscopy with lateral menisectomy (Left, 09/01/2012); and Meniscus repair (Right)  are also affecting patient's functional outcome.   REHAB POTENTIAL: Good  CLINICAL DECISION MAKING: Evolving/moderate  complexity  EVALUATION COMPLEXITY: Moderate   GOALS: Goals reviewed with patient? Yes  SHORT TERM GOALS: Target date: 08/07/22. Target date updated to 12/04/2022 on 11/19/2022.   Pt will be independent with initial HEP in order to improve strength and balance in order to decrease fall risk and improve function at home and work. Baseline:HEP given; patient not participating (11/19/2022); participating regularly (12/02/2022);  Goal status: MET   LONG TERM GOALS: Target date: 08/26/22. Target date updated to 02/11/2023 for all unmet goals on 11/19/2022.   Patient will demonstrate improved FOTO by equal or greater than 10 by visit #17 to demonstrate improvement in overall condition and self-reported functional ability.  Baseline: initial baseline 55 (07/01/2022); 60 at visit #17 (12/26/2022);  Goal status: in-progress  2.  Pt will decrease worst pain as reported on NPRS by at least 3 points in order to demonstrate clinically significant reduction in pain. Baseline: 4/10 (07/01/2022): up to 10/10  (11/19/2022); up to 4-5/10 when burning two days ago, 2.5/10 when excluding the burning (12/06/2022);  Goal status: on-going  3.  Pt will demonstrate plank time of or more to demonstrate age-predicted match of core strength needed for job duties Baseline: 10sec (07/01/2022): 24 seconds (11/19/2022); 23 seconds (12/26/2022);  Goal status: In-progress  4.  Pt will demonstrate bering sorenson test of 34sec or more to demonstrate clinically significant increase in spinal extensor strength needed for job duties Baseline: 20sec (07/01/2022); 58 seconds (11/19/2022);  Goal status: MET  5.  Patient will be independent with initial home exercise program for self-management of symptoms. Baseline: HEP to be reviewed and updated as needed at visit 7 as appropriate (11/19/22); she has has been participating 2x a week plus PT sessions + what  is left over after PT session (12/26/2022);  Goal status: MET    PLAN:  PT  FREQUENCY: 1-2x/week  PT DURATION: 12 weeks  PLANNED INTERVENTIONS: Therapeutic exercises, Therapeutic activity, Neuromuscular re-education, Balance training, Gait training, Patient/Family education,  Self Care, Joint mobilization, Stair training, Dry Needling, Electrical stimulation, Spinal mobilization, Cryotherapy, Moist heat, Manual therapy, and Re-evaluation.  PLAN FOR NEXT SESSION:  update HEP as appropriate, progressive core/LE/functional strengthening, consider repeated motions assessment, education. Manual therapy / dry needling as needed.   Luretha Murphy. [name], PT, DPT 12/26/22, 6:21 PM  [facility] [address] [place], [place] [address] P: [phone] I F: [phone]

## 2023-01-01 ENCOUNTER — Encounter: Payer: 59 | Admitting: Physical Therapy

## 2023-01-07 ENCOUNTER — Ambulatory Visit: Payer: 59 | Attending: Physical Medicine & Rehabilitation | Admitting: Physical Therapy

## 2023-01-07 ENCOUNTER — Encounter: Payer: Self-pay | Admitting: Physical Therapy

## 2023-01-07 DIAGNOSIS — M5459 Other low back pain: Secondary | ICD-10-CM | POA: Insufficient documentation

## 2023-01-07 DIAGNOSIS — M62838 Other muscle spasm: Secondary | ICD-10-CM | POA: Diagnosis not present

## 2023-01-07 DIAGNOSIS — M792 Neuralgia and neuritis, unspecified: Secondary | ICD-10-CM | POA: Insufficient documentation

## 2023-01-07 NOTE — Therapy (Signed)
OUTPATIENT PHYSICAL THERAPY TREATMENT    Patient Name: Theresa Ortiz MRN: [id number] DOB:[birth year], 32 y.o., female Today's Date: 01/07/2023  END OF SESSION:  PT End of Session - 01/07/23 1646     Visit Number 18    Number of Visits 25    Date for PT Re-Evaluation 02/11/23    Authorization Type [place] [employer] FOC[employer] reporting period from 12/02/2022    Authorization Time Period VL: 25 PT/OT combined, Auth req after 25th visit    Authorization - Visit Number 18    Authorization - Number of Visits 25    Progress Note Due on Visit 20    PT Start Time 1647    PT Stop Time 1727    PT Time Calculation (min) 40 min    Activity Tolerance Patient limited by pain;Patient tolerated treatment well    Behavior During Therapy [facility] for tasks assessed/performed             Past Medical History:  Diagnosis Date   Acute meniscal tear of knee    left knee   Allergy    Anxiety    Asthma    Atrial fibrillation (HCC)    slight   COVID-19 04/2020   Depression    Ovarian cyst    PVC (premature ventricular contraction)    Scoliosis    Shingles    Tachycardia    Past Surgical History:  Procedure Laterality Date   KNEE ARTHROSCOPY WITH LATERAL MENISECTOMY Left 09/01/2012   Procedure: LEFT KNEE ARTHROSCOPY WITH LATERAL MENISCECTOMY ;  Surgeon: Jacki Cones, MD;  Location: [facility];  Service: Orthopedics;  Laterality: Left;   MENISCUS REPAIR Right    RIGHT INDEX  FINGER TENDON REPAIR  NOV 2013   Patient Active Problem List   Diagnosis Date Noted   Palpitation 07/13/2018   Atypical chest pain 07/13/2018   Family history of heart disease 07/13/2018   Shortness of breath 07/13/2018   Sinus tachycardia 07/13/2018   Acute lateral meniscus tear of left knee 09/01/2012    PCP: Tisovec, Adelfa Koh, MD  REFERRING PROVIDER: Filomena Jungling, MD  REFERRING DIAG: 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 Pt reports she has some pain at the mid calf that is the size of packing tape that feels like a cramp. She reports she has tension at L low back and glute. LBP/glute pain aggravated by lifting/moving patients, and bending forward. Has had L sided sciatic symptoms 1x/week when lifting something heavy down post LLE. She hs previously had pain with prolonged walking, but that has subsided over the past year or so. Has had successful PT in the past with [facility], which she thinks is very helpful. Pt works full time as an Engineer, structural, enjoys traveling and walking her dog. Comorbid conditions as listed above. No falls in past 6 months.  denies N/V, B&B changes, unexplained weight fluctuation, saddle paresthesia, fever, night sweats, or unrelenting night pain at this time. Patient denies hx of cancer, stroke, seizures, diabetes, unexplained weight loss, unexplained changes in bowel or bladder problems, unexplained stumbling or dropping things, osteoporosis, and spinal surgery  SUBJECTIVE:  SUBJECTIVE STATEMENT: Patient states she did not do her exercises last week, with a very busy week at work and on the weekend. She states her back was "very angry" last Thursday because she had to use two C-arms in 10 minutes which is much more strenuous than she is used to. She states her back "got angry" when she was moving a stretcher that was in neutral (so it could move all directions). She had pain down her leg for about 1 hour. Today her back and leg  was just acting up with her normal duties. She states the heaviness in her leg walking into work has been worse at the end of last week and this week. She has not had the feeling of her back being weak except right after dry needling.   PAIN:  NPRS: 2/10 left buttocks with tingling to the bottom of the left foot, most noticeable in the heel.   PATIENT GOALS: Get the cramp out of my calf, to "eleviate whatever pissed it off this time out of the leg"   OBJECTIVE  TODAY'S TREATMENT: Therapeutic exercise: to centralize symptoms and improve ROM, strength, muscular endurance, and activity tolerance required for successful completion of functional activities.  - NuStep level 4-6 using bilateral upper and lower extremities. Seat/handle setting 9/9. For improved extremity mobility, muscular endurance, and activity tolerance; and to induce the analgesic effect of aerobic exercise, stimulate improved joint nutrition, and prepare body structures and systems for following interventions. x 6 minutes. Average SPM = 75. (Decreased intensity due to increasing L LE symptoms).  (Manual therapy - dry needling) - Quadruped bird dog (alternating shoulder flexion/contralateral hip extension with core muscles braced), with cone over sacrum for biofeedback to help keep core stabilized. 1x30 each side.  - wall lateral plank, 1x30 seconds each side.  - high plinth lateral plank, 1x30 seconds each side.  - Education on HEP including handout   Manual therapy: to reduce pain and tissue tension, improve range of motion, neuromodulation, in order to promote improved ability to complete functional activities. PRONE - STM to left deep hip rotators/glute  - Modality: Dry needling performed to left deep hip rotators to decrease pain and spasms along patient's left buttock region with patient in prone utilizing 1 dry needle(s) .30mm x with 3 stick at left piriformis region with multiple twitches noted. Patient educated  about the risks and benefits from therapy and verbally consents to treatment.  Dry needling performed by Luretha Murphy. [name] PT, DPT who is certified in this technique.  Pt required multimodal cuing for proper technique and to facilitate improved neuromuscular control, strength, range of motion, and functional ability resulting in improved performance and form.   PATIENT EDUCATION:  Education details: Exercise purpose/form. Self management techniques. Education on diagnosis, prognosis, POC, anatomy and physiology of current condition. Education on dry needling.  Person educated: Patient Education method: Medical illustrator Education comprehension: verbalized understanding and returned demonstration  HOME EXERCISE PROGRAM: Access Code: [email or web address] URL: [email or web address] Date: 01/07/2023 Prepared by: Norton Blizzard  Exercises - Seated Pelvic Floor Elevators  - 1 x daily - 2 sets - 10 reps - Seated Quick Flick Pelvic Floor Contractions  - 1 x daily - 2 sets - 10 reps - Seated Pelvic Floor Contraction  - 1 x daily - 2 sets - 10 reps - Supine Sciatic Nerve Glide  - 1-2 x daily - 7 x weekly - 10 reps - Bird Dog  - 3 x weekly - 2 sets -  10 reps - 5 second hold - Dead Bug  - 3 x weekly - 3 sets - 5-10 reps - Barbell Thruster  - 3 x weekly - 3 sets - 10 reps - Forearm Side Plank on Counter  - 3 x weekly - 3 sets - 30 seconds hold - Standard Plank  - 3 x weekly - 5 sets - 15 seconds hold - Seated Piriformis Stretch with Trunk Bend  - 3 x weekly - 3-5 reps - 10-30 seconds hold  HOME EXERCISE PROGRAM [[id number]] View at [email or web address]" using code: [id number] SELF TRACTION - LEVEL 3 - H16 -   ASSESSMENT:  CLINICAL IMPRESSION: Patient arrives with report of increased symptoms after a hard week at work and not doing her HEP or coming to PT last week. Dry needling utilized for pain control with strong cramping sensation and palpable twitch in left deep hip rotators/glute.  Returned to core stabilization exercises including bird dog and lateral plank with some discomfort. Patient reported low back felt better and left glute felt tender due to cramping from dry needling at end of session. Patient's worsening while away from PT last week suggests she continues to require physical therapy intervention to manage her pain and function. She also appears to benefit from consistent core stabilization/strengthening exercises that she did not get last week. Patient would benefit from continued management of limiting condition by skilled physical therapist to address remaining impairments and functional limitations to work towards stated goals and return to PLOF or maximal functional independence.   From Re-Evaluation 11/19/2022:  Patient is a 32 y.o. 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 Patient is returning to PT more than 90 days since last PT visit to continue care for the same condition as before. She completed 5 physical therapy sessions with improvement in symptoms before her attendance lapsed. She now presents with worsening left LE symptoms and low back and left glute pain since her work environment changed. She had good response to physical therapy intervention in the past and good potential for improvement again with consistent attendance. Patient did demonstrate improvement in trunk endurance at today's goal assessment. Patient presents with significant pain, paresthesia, muscle tension, motor control, muscle performance (strength/power/endurance), and activity tolerance impairments that are limiting ability to complete her usual activities such as working, walking, prolonged standing, bending, lifting, yardwork, housework, sleeping,  traveling, prolonged sitting, going to the beach, anything that requires repetitive bending or lifting without difficulty. Patient will benefit from skilled physical therapy intervention to address current body structure impairments and activity limitations to improve function and work towards goals set in current POC in order to return to prior level of function or maximal functional improvement.   OBJECTIVE IMPAIRMENTS: Abnormal gait, decreased activity tolerance, decreased balance, decreased endurance, decreased knowledge of condition, decreased mobility, difficulty walking, decreased ROM, decreased strength, increased fascial restrictions, impaired perceived functional ability, increased muscle spasms, impaired flexibility, improper body mechanics, postural dysfunction, obesity, and pain.   ACTIVITY LIMITATIONS: carrying, lifting, bending, sitting, standing, squatting, stairs, transfers, hygiene/grooming, locomotion level, and caring for others  PARTICIPATION LIMITATIONS: meal prep, cleaning, laundry, driving, shopping, community activity, occupation, yard work, and   working, walking, prolonged standing, bending, lifting, yardwork, housework, sleeping, traveling, prolonged sitting, going to [employer], anything that requires repetitive bending or lifting  PERSONAL FACTORS: Fitness, Past/current experiences, Time since onset of injury/illness/exacerbation, and 1-2 comorbidities: lateral meniscus tear of left knee; Palpitation; Atypical chest pain; Family history of heart disease; Shortness of breath; and Sinus tachycardia on their problem list. past medical history of Acute meniscal tear of knee, Allergy, Anxiety, Asthma, Atrial fibrillation (HCC), COVID-19 (04/2020), Depression, Ovarian cyst, PVC (premature ventricular contraction), Scoliosis, Shingles, and Tachycardia. past surgical history that includes RIGHT INDEX  FINGER TENDON REPAIR (NOV 2013); Knee arthroscopy with lateral menisectomy (Left,  09/01/2012); and Meniscus repair (Right)  are also affecting patient's functional outcome.   REHAB POTENTIAL: Good  CLINICAL DECISION MAKING: Evolving/moderate complexity  EVALUATION COMPLEXITY: Moderate   GOALS: Goals reviewed with patient? Yes  SHORT TERM GOALS: Target date: 08/07/22. Target date updated to 12/04/2022 on 11/19/2022.   Pt will be independent with initial HEP in order to improve strength and balance in order to decrease fall risk and improve function at home and work. Baseline:HEP given; patient not participating (11/19/2022); participating regularly (12/02/2022);  Goal status: MET   LONG TERM GOALS: Target date: 08/26/22. Target date updated to 02/11/2023 for all unmet goals on 11/19/2022.   Patient will demonstrate improved FOTO by equal or greater than 10 by visit #17 to demonstrate improvement in overall condition and self-reported functional ability.  Baseline: initial baseline 55 (07/01/2022); 60 at visit #17 (12/26/2022);  Goal status: in-progress  2.  Pt will decrease worst pain as reported on NPRS by at least 3 points in order to demonstrate clinically significant reduction in pain. Baseline: 4/10 (07/01/2022): up to 10/10  (11/19/2022); up to 4-5/10 when burning two days ago, 2.5/10 when excluding the burning (12/06/2022);  Goal status: on-going  3.  Pt will demonstrate plank time of or more to demonstrate age-predicted match of core strength needed for job duties Baseline: 10sec (07/01/2022): 24 seconds (11/19/2022); 23 seconds (12/26/2022);  Goal status: In-progress  4.  Pt will demonstrate bering sorenson test of 34sec or more to demonstrate clinically significant increase in spinal extensor strength needed for job duties Baseline: 20sec (07/01/2022); 58 seconds (11/19/2022);  Goal status: MET  5.  Patient will be independent with initial home exercise program for self-management of symptoms. Baseline: HEP to be reviewed and updated as needed at visit 7 as appropriate  (11/19/22); she has has been participating 2x a week plus PT sessions + what  is left over after PT session (12/26/2022);  Goal status: MET    PLAN:  PT FREQUENCY: 1-2x/week  PT DURATION: 12 weeks  PLANNED INTERVENTIONS: Therapeutic exercises, Therapeutic activity, Neuromuscular re-education, Balance training, Gait training, Patient/Family education, Self Care, Joint mobilization, Stair training, Dry Needling, Electrical stimulation, Spinal mobilization, Cryotherapy, Moist heat, Manual therapy, and Re-evaluation.  PLAN FOR NEXT SESSION:  update HEP as appropriate, progressive core/LE/functional strengthening, consider repeated motions assessment, education. Manual therapy / dry needling as needed.   Luretha Murphy. [name], PT, DPT 01/07/23, 7:29 PM  [facility] [address] [place], [place] [address] P: [phone] I F: [phone]

## 2023-01-14 ENCOUNTER — Encounter: Payer: Self-pay | Admitting: Physical Therapy

## 2023-01-14 ENCOUNTER — Ambulatory Visit: Payer: 59 | Admitting: Physical Therapy

## 2023-01-14 DIAGNOSIS — M792 Neuralgia and neuritis, unspecified: Secondary | ICD-10-CM | POA: Diagnosis not present

## 2023-01-14 DIAGNOSIS — M62838 Other muscle spasm: Secondary | ICD-10-CM | POA: Diagnosis not present

## 2023-01-14 DIAGNOSIS — M5459 Other low back pain: Secondary | ICD-10-CM | POA: Diagnosis not present

## 2023-01-14 NOTE — Therapy (Signed)
OUTPATIENT PHYSICAL THERAPY TREATMENT    Patient Name: Theresa Ortiz MRN: [id number] DOB:[birth year], 32 y.o., female Today's Date: 01/14/2023  END OF SESSION:  PT End of Session - 01/14/23 1734     Visit Number 19    Number of Visits 25    Date for PT Re-Evaluation 02/11/23    Authorization Type [place] [employer] FOCUS reporting period from 12/02/2022    Authorization Time Period VL: 25 PT/OT combined, Auth req after 25th visit    Authorization - Visit Number 19    Authorization - Number of Visits 25    Progress Note Due on Visit 20    PT Start Time 1732    PT Stop Time 1820    PT Time Calculation (min) 48 min    Activity Tolerance Patient tolerated treatment well;Patient limited by fatigue    Behavior During Therapy [facility] for tasks assessed/performed              Past Medical History:  Diagnosis Date   Acute meniscal tear of knee    left knee   Allergy    Anxiety    Asthma    Atrial fibrillation (HCC)    slight   COVID-19 04/2020   Depression    Ovarian cyst    PVC (premature ventricular contraction)    Scoliosis    Shingles    Tachycardia    Past Surgical History:  Procedure Laterality Date   KNEE ARTHROSCOPY WITH LATERAL MENISECTOMY Left 09/01/2012   Procedure: LEFT KNEE ARTHROSCOPY WITH LATERAL MENISCECTOMY ;  Surgeon: Jacki Cones, MD;  Location: [facility];  Service: Orthopedics;  Laterality: Left;   MENISCUS REPAIR Right    RIGHT INDEX  FINGER TENDON REPAIR  NOV 2013   Patient Active Problem List   Diagnosis Date Noted   Palpitation 07/13/2018   Atypical chest pain 07/13/2018   Family history of heart disease 07/13/2018   Shortness of breath 07/13/2018   Sinus tachycardia 07/13/2018   Acute lateral meniscus tear of left knee 09/01/2012    PCP: Tisovec, Adelfa Koh, MD  REFERRING PROVIDER: Filomena Jungling, MD  REFERRING DIAG: 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 Pt reports she has some pain at the mid calf that is the size of packing tape that feels like a cramp. She reports she has tension at L low back and glute. LBP/glute pain aggravated by lifting/moving patients, and bending forward. Has had L sided sciatic symptoms 1x/week when lifting something heavy down post LLE. She hs previously had pain with prolonged walking, but that has subsided over the past year or so. Has had successful PT in the past with [facility], which she thinks is very helpful. Pt works full time as an Engineer, structural, enjoys traveling and walking her dog. Comorbid conditions as listed above. No falls in past 6 months.  denies N/V, B&B changes, unexplained weight fluctuation, saddle paresthesia, fever, night sweats, or unrelenting night pain at this time. Patient denies hx of cancer, stroke, seizures, diabetes, unexplained weight loss, unexplained changes in bowel or bladder problems, unexplained stumbling or dropping things, osteoporosis, and spinal surgery  SUBJECTIVE:  SUBJECTIVE STATEMENT: Patient states her back and leg are feeling pretty good today but she had a lot of fatigue in her left leg walking into work yesterday. She states she has not done any crazy things this weekend. She is supposed to go to the beach next week.   PAIN:  NPRS: 1/10 left buttocks and posterior thigh, "feels weird"   PATIENT GOALS: Get the cramp out of my calf, to "eleviate whatever pissed it off this time out of the  leg"   OBJECTIVE  TODAY'S TREATMENT: Therapeutic exercise: to centralize symptoms and improve ROM, strength, muscular endurance, and activity tolerance required for successful completion of functional activities.  - NuStep level 6 using bilateral upper and lower extremities. Seat/handle setting 9/9. For improved extremity mobility, muscular endurance, and activity tolerance; and to induce the analgesic effect of aerobic exercise, stimulate improved joint nutrition, and prepare body structures and systems for following interventions. x 6 minutes. Average SPM = 72.  - sit <> stand with overhead press using 8#DB in each hand and 17 inch chair, 3x10.  - suitcase carry, 3x100 feet each side holding 30#KB in one hand.   Superset: - prone lat pull down on TOTAL gym, 2x10 at level 10.  - seated trunk twist on TOTAL gym, 2x10 at level 10.   - plank 3x15 seconds toes to elbows with 15 second rests between (unable to hold the last one full 15 seconds). Provided longer break and moved to knees to elbows 2x15 seconds.   Manual therapy: to reduce pain and tissue tension, improve range of motion, neuromodulation, in order to promote improved ability to complete functional activities. PRONE - STM to left deep hip rotators/glute  - Modality: Dry needling performed to left deep hip rotators to decrease pain and spasms along patient's left buttock region with patient in prone utilizing 1 dry needle(s) .30mm x with 1 stick at left piriformis region with multiple twitches noted. Patient educated about the risks and benefits from therapy and verbally consents to treatment.  Dry needling performed by Luretha Murphy. [name] PT, DPT who is certified in this technique.  Pt required multimodal cuing for proper technique and to facilitate improved neuromuscular control, strength, range of motion, and functional ability resulting in improved performance and form.   PATIENT EDUCATION:  Education details: Exercise  purpose/form. Self management techniques. Education on diagnosis, prognosis, POC, anatomy and physiology of current condition. Education on dry needling.  Person educated: Patient Education method: Medical illustrator Education comprehension: verbalized understanding and returned demonstration  HOME EXERCISE PROGRAM: Access Code: [email or web address] URL: [email or web address] Date: 01/07/2023 Prepared by: Norton Blizzard  Exercises - Seated Pelvic Floor Elevators  - 1 x daily - 2 sets - 10 reps - Seated Quick Flick Pelvic Floor Contractions  - 1 x daily - 2 sets - 10 reps - Seated Pelvic Floor Contraction  - 1 x daily - 2 sets - 10 reps - Supine Sciatic Nerve Glide  - 1-2 x daily - 7 x weekly - 10 reps - Bird Dog  - 3 x weekly - 2 sets - 10 reps - 5 second hold - Dead Bug  - 3 x weekly - 3 sets - 5-10 reps - Barbell Thruster  - 3 x weekly - 3 sets - 10 reps - Forearm Side Plank on Counter  - 3 x weekly - 3 sets - 30 seconds hold - Standard Plank  - 3 x weekly - 5 sets - 15 seconds hold - Seated Piriformis  Stretch with Trunk Bend  - 3 x weekly - 3-5 reps - 10-30 seconds hold  HOME EXERCISE PROGRAM [[email or web address]] View at [email or web address]" using code: [email or web address] SELF TRACTION - LEVEL 3 - H16 -   ASSESSMENT:  CLINICAL IMPRESSION: Patient arrives with low levels of pain today. Session focused on strengthening and stability exercises with not significant exacerbation of symptoms noted, although patient fatigued quickly. Dry needling performed due to feeling tight at left glute region with good tolerance. Plan to complete 20th visit progress note next session. Patient would benefit from continued management of limiting condition by skilled physical therapist to address remaining impairments and functional limitations to work towards stated goals and return to PLOF or maximal functional independence.   From Re-Evaluation 11/19/2022:  Patient is a 32 y.o. 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 Patient is returning to PT more than 90 days since last PT visit to continue care for the same condition as before. She completed 5 physical therapy sessions with improvement in symptoms before her attendance lapsed. She now presents with worsening left LE symptoms and low back and left glute pain since her work environment changed. She had good response to physical therapy intervention in the past and good potential for improvement again with consistent attendance. Patient did demonstrate improvement in trunk endurance at today's goal assessment. Patient presents with significant pain, paresthesia, muscle tension, motor control, muscle performance (strength/power/endurance), and activity tolerance impairments that are limiting ability to complete her usual activities such as working, walking, prolonged standing, bending, lifting, yardwork, housework, sleeping, traveling, prolonged sitting, going to the beach, anything that requires repetitive bending or lifting without difficulty. Patient will benefit from skilled physical therapy intervention to address current body structure impairments and activity limitations to improve function and work towards goals set in current POC in order to return to prior level of function or maximal functional improvement.   OBJECTIVE IMPAIRMENTS: Abnormal gait, decreased activity tolerance, decreased balance, decreased endurance, decreased knowledge of condition, decreased mobility, difficulty walking, decreased ROM, decreased strength, increased fascial restrictions, impaired perceived functional ability, increased muscle spasms, impaired flexibility, improper body mechanics, postural dysfunction, obesity, and pain.   ACTIVITY  LIMITATIONS: carrying, lifting, bending, sitting, standing, squatting, stairs, transfers, hygiene/grooming, locomotion level, and caring for others  PARTICIPATION LIMITATIONS: meal prep, cleaning, laundry, driving, shopping, community activity, occupation, yard work, and   working, walking, prolonged standing, bending, lifting, yardwork, housework, sleeping, traveling, prolonged sitting, going to [employer], anything that requires repetitive bending or lifting   PERSONAL FACTORS: Fitness, Past/current experiences, Time since onset of injury/illness/exacerbation, and 1-2 comorbidities: lateral meniscus tear of left knee; Palpitation; Atypical chest pain; Family history of heart disease; Shortness of breath; and Sinus tachycardia on their problem list. past medical history of Acute meniscal tear of knee, Allergy, Anxiety, Asthma, Atrial fibrillation (HCC), COVID-19 (04/2020), Depression, Ovarian cyst, PVC (premature ventricular contraction), Scoliosis, Shingles, and Tachycardia. past surgical history that includes RIGHT INDEX  FINGER TENDON REPAIR (NOV 2013); Knee arthroscopy with lateral menisectomy (Left, 09/01/2012); and Meniscus repair (Right)  are also affecting patient's functional outcome.   REHAB POTENTIAL: Good  CLINICAL DECISION MAKING: Evolving/moderate complexity  EVALUATION COMPLEXITY: Moderate   GOALS: Goals reviewed with patient? Yes  SHORT TERM GOALS: Target date: 08/07/22. Target date updated to 12/04/2022 on 11/19/2022.  Pt will be independent with initial HEP in order to improve strength and balance in order to decrease fall risk and improve function at home and work. Baseline:HEP given; patient not participating (11/19/2022); participating regularly (12/02/2022);  Goal status: MET   LONG TERM GOALS: Target date: 08/26/22. Target date updated to 02/11/2023 for all unmet goals on 11/19/2022.   Patient will demonstrate improved FOTO by equal or greater than 10 by visit #17 to demonstrate  improvement in overall condition and self-reported functional ability.  Baseline: initial baseline 55 (07/01/2022); 60 at visit #17 (12/26/2022);  Goal status: in-progress  2.  Pt will decrease worst pain as reported on NPRS by at least 3 points in order to demonstrate clinically significant reduction in pain. Baseline: 4/10 (07/01/2022): up to 10/10  (11/19/2022); up to 4-5/10 when burning two days ago, 2.5/10 when excluding the burning (12/06/2022);  Goal status: on-going  3.  Pt will demonstrate plank time of or more to demonstrate age-predicted match of core strength needed for job duties Baseline: 10sec (07/01/2022): 24 seconds (11/19/2022); 23 seconds (12/26/2022);  Goal status: In-progress  4.  Pt will demonstrate bering sorenson test of 34sec or more to demonstrate clinically significant increase in spinal extensor strength needed for job duties Baseline: 20sec (07/01/2022); 58 seconds (11/19/2022);  Goal status: MET  5.  Patient will be independent with initial home exercise program for self-management of symptoms. Baseline: HEP to be reviewed and updated as needed at visit 7 as appropriate (11/19/22); she has has been participating 2x a week plus PT sessions + what  is left over after PT session (12/26/2022);  Goal status: MET    PLAN:  PT FREQUENCY: 1-2x/week  PT DURATION: 12 weeks  PLANNED INTERVENTIONS: Therapeutic exercises, Therapeutic activity, Neuromuscular re-education, Balance training, Gait training, Patient/Family education, Self Care, Joint mobilization, Stair training, Dry Needling, Electrical stimulation, Spinal mobilization, Cryotherapy, Moist heat, Manual therapy, and Re-evaluation.  PLAN FOR NEXT SESSION:  update HEP as appropriate, progressive core/LE/functional strengthening, consider repeated motions assessment, education. Manual therapy / dry needling as needed.   Luretha Murphy. [name], PT, DPT 01/14/23, 7:40 PM  [facility] [address] [place], [place] [address] P: [phone] I F: [phone]

## 2023-01-16 ENCOUNTER — Ambulatory Visit: Payer: 59 | Admitting: Physical Therapy

## 2023-01-17 DIAGNOSIS — R109 Unspecified abdominal pain: Secondary | ICD-10-CM | POA: Diagnosis not present

## 2023-01-20 ENCOUNTER — Ambulatory Visit: Payer: 59 | Admitting: Physical Therapy

## 2023-01-20 ENCOUNTER — Encounter: Payer: Self-pay | Admitting: Physical Therapy

## 2023-01-20 DIAGNOSIS — M5459 Other low back pain: Secondary | ICD-10-CM | POA: Diagnosis not present

## 2023-01-20 DIAGNOSIS — M62838 Other muscle spasm: Secondary | ICD-10-CM

## 2023-01-20 DIAGNOSIS — M792 Neuralgia and neuritis, unspecified: Secondary | ICD-10-CM | POA: Diagnosis not present

## 2023-01-20 NOTE — Therapy (Unsigned)
OUTPATIENT PHYSICAL THERAPY TREATMENT / PROGRESS NOTE/ RE-CERTIFICATION Dates of reporting from 12/02/2022 to 01/20/2023   Patient Name: Theresa Ortiz MRN: [id number] DOB:08-23-1990, 32 y.o., female Today's Date: 01/20/2023  END OF SESSION:  PT End of Session - 01/20/23 1741     Visit Number 20    Number of Visits 25    Date for PT Re-Evaluation 04/14/23    Authorization Type [place] [employer] reporting period from 12/02/2022    Authorization Time Period VL: 25 PT/OT combined, Auth req after 25th visit    Authorization - Visit Number 20    Authorization - Number of Visits 25    Progress Note Due on Visit 20    PT Start Time 1735    PT Stop Time 1815    PT Time Calculation (min) 40 min    Activity Tolerance Patient tolerated treatment well;Patient limited by fatigue    Behavior During Therapy [facility] for tasks assessed/performed               Past Medical History:  Diagnosis Date   Acute meniscal tear of knee    left knee   Allergy    Anxiety    Asthma    Atrial fibrillation (HCC)    slight   COVID-19 04/2020   Depression    Ovarian cyst    PVC (premature ventricular contraction)    Scoliosis    Shingles    Tachycardia    Past Surgical History:  Procedure Laterality Date   KNEE ARTHROSCOPY WITH LATERAL MENISECTOMY Left 09/01/2012   Procedure: LEFT KNEE ARTHROSCOPY WITH LATERAL MENISCECTOMY ;  Surgeon: Jacki Cones, MD;  Location: [facility] [place];  Service: Orthopedics;  Laterality: Left;   MENISCUS REPAIR Right    RIGHT INDEX  FINGER TENDON REPAIR  NOV 2013   Patient Active Problem List   Diagnosis Date Noted   Palpitation 07/13/2018   Atypical chest pain 07/13/2018   Family history of heart disease 07/13/2018   Shortness of breath 07/13/2018   Sinus tachycardia 07/13/2018   Acute lateral meniscus tear of left knee 09/01/2012    PCP: Tisovec, Adelfa Koh, MD  REFERRING PROVIDER: Filomena Jungling, MD  REFERRING DIAG: 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 Pt reports she has some pain at the mid calf that is the size of packing tape that feels like a cramp. She reports she has tension at L low back and glute. LBP/glute pain aggravated by lifting/moving patients, and bending forward. Has had L sided sciatic symptoms 1x/week when lifting something heavy down post LLE. She hs previously had pain with prolonged walking, but that has subsided over the past year or so. Has had successful PT in the past with [facility], which she thinks is very helpful. Pt works full time as an Engineer, structural, enjoys traveling and walking her dog. Comorbid conditions as listed above. No falls in past 6 months.  denies N/V, B&B changes, unexplained weight fluctuation, saddle paresthesia, fever, night sweats, or unrelenting night pain at this time. Patient denies hx of cancer, stroke, seizures, diabetes, unexplained weight loss, unexplained changes in bowel or bladder problems,  unexplained stumbling or dropping things, osteoporosis, and spinal surgery  SUBJECTIVE:                                                                                                                                                                                           SUBJECTIVE STATEMENT: Patient states her left sciatic nerve was bothering her while sitting on her couch today. She noticed pain going down to her left calf, and did not stay long enough to be annoying. She states she currently feels soreness in the left buttocks. She did not have the heavy feeling walking in to work today. She was holding a 33 year old for a long time and and she was  hunched over carrying him around for about 15 minutes. She states this would have previously really bothered her back, but it didn't seem to this weekend. She feels like her back is doing pretty good. She feels that physical therapy is helping her. She states she was sore after last PT session. She had muscle soreness after last PT session. She states the hardest part of doing her HEP is getting started, which might related to her ADHD. She states she thinks getting into a rhythm or habit of doing things helps her overcome this. She did her HEP twice since last PT session. She finds it helpful to do it with a friend or doing the exercises during a commercial. She states her pain has been up to 3-4/10 in the last week.   PAIN:  NPRS: 0.5/10 left buttocks.   PATIENT GOALS: Get the cramp out of my calf, to "eleviate whatever pissed it off this time out of the leg"   OBJECTIVE  SELF-REPORTED FUNCTION FOTO score: 58/100 (lumbar spine questionnaire)  FUNCTIONAL TESTS Plank: 37 seconds Bering-Sorenson test: 61 seconds   TODAY'S TREATMENT: Therapeutic exercise: to centralize symptoms and improve ROM, strength, muscular endurance, and activity tolerance required for successful completion of functional activities.  - NuStep level 6 using bilateral upper and lower extremities. Seat/handle setting 9/9. For improved extremity mobility, muscular endurance, and activity tolerance; and to induce the analgesic effect of aerobic exercise, stimulate improved joint nutrition, and prepare body structures and systems for following interventions. x 6 minutes. Average SPM = 73.  - testing to assess progress (see above).   Superset: - prone lat pull down on TOTAL gym, 2x10 at level 10.  - seated trunk twist on TOTAL gym, 2x10 at level 10.   Pt required multimodal cuing for proper technique and to facilitate improved neuromuscular control, strength, range of motion, and functional ability resulting in improved  performance and form.  PATIENT EDUCATION:  Education details: Exercise purpose/form. Self management techniques. Education on diagnosis, prognosis, POC, anatomy and physiology of current condition. Education on dry needling.  Person educated: Patient Education method: Medical illustrator Education comprehension: verbalized understanding and returned demonstration  HOME EXERCISE PROGRAM: Access Code: [email or web address] URL: [email or web address] Date: 01/07/2023 Prepared by: Norton Blizzard  Exercises - Seated Pelvic Floor Elevators  - 1 x daily - 2 sets - 10 reps - Seated Quick Flick Pelvic Floor Contractions  - 1 x daily - 2 sets - 10 reps - Seated Pelvic Floor Contraction  - 1 x daily - 2 sets - 10 reps - Supine Sciatic Nerve Glide  - 1-2 x daily - 7 x weekly - 10 reps - Bird Dog  - 3 x weekly - 2 sets - 10 reps - 5 second hold - Dead Bug  - 3 x weekly - 3 sets - 5-10 reps - Barbell Thruster  - 3 x weekly - 3 sets - 10 reps - Forearm Side Plank on Counter  - 3 x weekly - 3 sets - 30 seconds hold - Standard Plank  - 3 x weekly - 5 sets - 15 seconds hold - Seated Piriformis Stretch with Trunk Bend  - 3 x weekly - 3-5 reps - 10-30 seconds hold  HOME EXERCISE PROGRAM [[email or web address]] View at [email or web address]" using code: [id number] SELF TRACTION - LEVEL 3 - H16 -   ASSESSMENT:  CLINICAL IMPRESSION: Patient has attended 20 physical therapy sessions since starting current episode of care on 07/01/2022. Patient has demonstrated improvement in symptoms and function, with good response to dry needling and core strengthening activities. Patient does demonstrate signs and of a hypermobility which appears to increase her sensitivity to pain and slows the strength building process. She has made good progress towards her strength and functional goals but has not yet reached her full rehab potential. Plan to continue with PT at a rate of 1-2x/week as needed to address pain and functional  limitations while working towards increasing patient independence and readiness for discharge. Patient would benefit from continued management of limiting condition by skilled physical therapist to address remaining impairments and functional limitations to work towards stated goals and return to PLOF or maximal functional independence.   From Re-Evaluation 11/19/2022:  Patient is a 32 y.o. 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 Patient is returning to PT more than 90 days since last PT visit to continue care for the same condition as before. She completed 5 physical therapy sessions with improvement in symptoms before her attendance lapsed. She now presents with worsening left LE symptoms and low back and left glute pain since her work environment changed. She had good response to physical therapy intervention in the past and good potential for improvement again with consistent attendance. Patient did demonstrate improvement in trunk endurance at today's goal assessment. Patient presents with significant pain, paresthesia, muscle tension, motor control, muscle performance (strength/power/endurance), and activity tolerance impairments that are limiting ability to complete her usual activities such as working, walking, prolonged standing, bending, lifting, yardwork, housework, sleeping, traveling, prolonged sitting, going to the beach, anything that requires repetitive bending or lifting without difficulty. Patient will benefit from skilled physical therapy intervention to address current body structure impairments and activity limitations to improve function and work towards  goals set in current POC in order to return to prior level of function or maximal functional  improvement.   OBJECTIVE IMPAIRMENTS: Abnormal gait, decreased activity tolerance, decreased balance, decreased endurance, decreased knowledge of condition, decreased mobility, difficulty walking, decreased ROM, decreased strength, increased fascial restrictions, impaired perceived functional ability, increased muscle spasms, impaired flexibility, improper body mechanics, postural dysfunction, obesity, and pain.   ACTIVITY LIMITATIONS: carrying, lifting, bending, sitting, standing, squatting, stairs, transfers, hygiene/grooming, locomotion level, and caring for others  PARTICIPATION LIMITATIONS: meal prep, cleaning, laundry, driving, shopping, community activity, occupation, yard work, and   working, walking, prolonged standing, bending, lifting, yardwork, housework, sleeping, traveling, prolonged sitting, going to [employer], anything that requires repetitive bending or lifting   PERSONAL FACTORS: Fitness, Past/current experiences, Time since onset of injury/illness/exacerbation, and 1-2 comorbidities: lateral meniscus tear of left knee; Palpitation; Atypical chest pain; Family history of heart disease; Shortness of breath; and Sinus tachycardia on their problem list. past medical history of Acute meniscal tear of knee, Allergy, Anxiety, Asthma, Atrial fibrillation (HCC), COVID-19 (04/2020), Depression, Ovarian cyst, PVC (premature ventricular contraction), Scoliosis, Shingles, and Tachycardia. past surgical history that includes RIGHT INDEX  FINGER TENDON REPAIR (NOV 2013); Knee arthroscopy with lateral menisectomy (Left, 09/01/2012); and Meniscus repair (Right)  are also affecting patient's functional outcome.   REHAB POTENTIAL: Good  CLINICAL DECISION MAKING: Evolving/moderate complexity  EVALUATION COMPLEXITY: Moderate   GOALS: Goals reviewed with patient? Yes  SHORT TERM GOALS: Target date: 08/07/22. Target date updated to 12/04/2022 on 11/19/2022.   Pt will be independent with initial HEP in  order to improve strength and balance in order to decrease fall risk and improve function at home and work. Baseline:HEP given; patient not participating (11/19/2022); participating regularly (12/02/2022);  Goal status: MET   LONG TERM GOALS: Target date: 08/26/22. Target date updated to 02/11/2023 for all unmet goals on 11/19/2022. Target date updated to 04/14/2023 for all unmet goals on 01/20/2023.   Patient will demonstrate improved FOTO by equal or greater than 10 by visit #17 to demonstrate improvement in overall condition and self-reported functional ability.  Baseline: initial baseline 55 (07/01/2022); 60 at visit #17 (12/26/2022);  Goal status: in-progress  2.  Pt will decrease worst pain as reported on NPRS by at least 3 points in order to demonstrate clinically significant reduction in pain. Baseline: 4/10 (07/01/2022): up to 10/10  (11/19/2022); up to 4-5/10 when burning two days ago, 2.5/10 when excluding the burning (12/06/2022); 3-4/10 in the last 2 weeks (01/20/2023);  Goal status: on-going  3.  Pt will demonstrate plank time of or more to demonstrate age-predicted match of core strength needed for job duties Baseline: 10sec (07/01/2022): 24 seconds (11/19/2022); 23 seconds (12/26/2022); 37 seconds (01/20/2023);  Goal status: In-progress  4.  Pt will demonstrate bering sorenson test of 34sec or more to demonstrate clinically significant increase in spinal extensor strength needed for job duties Baseline: 20sec (07/01/2022); 58 seconds (11/19/2022);  Goal status: MET  5.  Patient will be independent with initial home exercise program for self-management of symptoms. Baseline: HEP to be reviewed and updated as needed at visit 7 as appropriate (11/19/22); she has has been participating 2x a week plus PT sessions + what  is left over after PT session (12/26/2022);  Goal status: MET    PLAN:  PT FREQUENCY: 1-2x/week  PT DURATION: 12 weeks  PLANNED INTERVENTIONS: Therapeutic exercises,  Therapeutic activity, Neuromuscular re-education, Balance training, Gait training, Patient/Family education, Self Care, Joint mobilization, Stair training, Dry Needling,  Electrical stimulation, Spinal mobilization, Cryotherapy, Moist heat, Manual therapy, and Re-evaluation.  PLAN FOR NEXT SESSION:  update HEP as appropriate, progressive core/LE/functional strengthening, consider repeated motions assessment, education. Manual therapy / dry needling as needed.   Luretha Murphy. [name], PT, DPT 01/20/23, 8:27 PM  [facility] [address] [place], [place] [address] P: [phone] I F: [phone]

## 2023-01-22 ENCOUNTER — Encounter: Payer: 59 | Admitting: Physical Therapy

## 2023-01-28 ENCOUNTER — Other Ambulatory Visit: Payer: Self-pay

## 2023-01-28 MED ORDER — FOLIC ACID 1 MG PO TABS
1.0000 mg | ORAL_TABLET | Freq: Every day | ORAL | 0 refills | Status: DC
  Filled 2023-01-28: qty 30, 30d supply, fill #0

## 2023-01-28 MED ORDER — AMPHETAMINE-DEXTROAMPHET ER 20 MG PO CP24
20.0000 mg | ORAL_CAPSULE | Freq: Every day | ORAL | 0 refills | Status: DC
  Filled 2023-01-28: qty 30, 30d supply, fill #0

## 2023-01-28 NOTE — Therapy (Signed)
OUTPATIENT PHYSICAL THERAPY TREATMENT  Patient Name: Theresa Ortiz MRN: [id number] DOB:04-Feb-1991, 32 y.o., female Today's Date: 01/29/2023  END OF SESSION:    PT End of Session - 01/20/23 1741     Visit Number 21   Number of Visits 25    Date for PT Re-Evaluation 04/14/23    Authorization Type [place] [employer] FOC[employer] reporting period from 12/02/2022    Authorization Time Period VL: 25 PT/OT combined, Auth req after 25th visit    Authorization - Visit Number 21   Authorization - Number of Visits 25    Progress Note Due on Visit 20    PT Start Time 1645    PT Stop Time 1728    PT Time Calculation (min) 43 min    Activity Tolerance Patient tolerated treatment well;Patient limited by fatigue    Behavior During Therapy [facility] for tasks assessed/performed               Past Medical History:  Diagnosis Date   Acute meniscal tear of knee    left knee   Allergy    Anxiety    Asthma    Atrial fibrillation (HCC)    slight   COVID-19 04/2020   Depression    Ovarian cyst    PVC (premature ventricular contraction)    Scoliosis    Shingles    Tachycardia    Past Surgical History:  Procedure Laterality Date   KNEE ARTHROSCOPY WITH LATERAL MENISECTOMY Left 09/01/2012   Procedure: LEFT KNEE ARTHROSCOPY WITH LATERAL MENISCECTOMY ;  Surgeon: Jacki Cones, MD;  Location: [facility];  Service: Orthopedics;  Laterality: Left;   MENISCUS REPAIR Right    RIGHT INDEX  FINGER TENDON REPAIR  NOV 2013   Patient Active Problem List   Diagnosis Date Noted   Palpitation 07/13/2018   Atypical chest pain 07/13/2018   Family history of heart disease 07/13/2018   Shortness of breath 07/13/2018   Sinus tachycardia 07/13/2018   Acute lateral meniscus tear of left knee 09/01/2012    PCP: Tisovec, Adelfa Koh, MD  REFERRING PROVIDER: Filomena Jungling, MD  REFERRING DIAG: 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 Pt reports she has some pain at the mid calf that is the size of packing tape that feels like a cramp. She reports she has tension at L low back and glute. LBP/glute pain aggravated by lifting/moving patients, and bending forward. Has had L sided sciatic symptoms 1x/week when lifting something heavy down post LLE. She hs previously had pain with prolonged walking, but that has subsided over the past year or so. Has had successful PT in the past with [facility], which she thinks is very helpful. Pt works full time as an Engineer, structural, enjoys traveling and walking her dog. Comorbid conditions as listed above. No falls in past 6 months.  denies N/V, B&B changes, unexplained weight fluctuation, saddle paresthesia, fever, night sweats, or unrelenting night pain at this time. Patient denies hx of cancer, stroke, seizures, diabetes, unexplained weight loss, unexplained changes in bowel or bladder problems, unexplained stumbling or dropping things, osteoporosis, and spinal surgery  SUBJECTIVE:  SUBJECTIVE STATEMENT:   Patient reports whenever she bends over, lifts leg to get dressed, or step up onto stair, she feels a tightness in her L buttocks.   PAIN:  NPRS: 0.5/10 left buttocks.   PATIENT GOALS: Get the cramp out of my calf, to "eleviate whatever pissed it off this time out of the leg"   OBJECTIVE  SELF-REPORTED FUNCTION FOTO score: 58/100 (lumbar spine questionnaire)  FUNCTIONAL TESTS Plank: 37 seconds Bering-Sorenson test: 61  seconds   TODAY'S TREATMENT:   Therapeutic exercise: to centralize symptoms and improve ROM, strength, muscular endurance, and activity tolerance required for successful completion of functional activities.  - NuStep level 6 using bilateral upper and lower extremities. Seat/handle setting 9/9. For improved extremity mobility, muscular endurance, and activity tolerance; and to induce the analgesic effect of aerobic exercise, stimulate improved joint nutrition, and prepare body structures and systems for following interventions. x 6 minutes. Average SPM = 73.   Manual:  -General LLE stretching (hamstring, FABER, FADIR (piriformis))  -STM to L piriformis and glutes   TE: -Seated piriformis stretch on L 3 x 30 seconds  - Quadruped bird dog (alternating shoulder flexion/contralateral hip extension with core muscles braced) 2x30 each side  -Paloff press at OMEGA 15# 2 x 10 each side  - suitcase carry, 3x100 feet each side holding 30#KB in one hand.  Pt required multimodal cuing for proper technique and to facilitate improved neuromuscular control, strength, range of motion, and functional ability resulting in improved performance and form.   PATIENT EDUCATION:  Education details: Exercise purpose/form. Self management techniques. Education on diagnosis, prognosis, POC, anatomy and physiology of current condition. Education on dry needling.  Person educated: Patient Education method: Medical illustrator Education comprehension: verbalized understanding and returned demonstration  HOME EXERCISE PROGRAM: Access Code: [email or web address] URL: [email or web address] Date: 01/07/2023 Prepared by: Norton Blizzard  Exercises - Seated Pelvic Floor Elevators  - 1 x daily - 2 sets - 10 reps - Seated Quick Flick Pelvic Floor Contractions  - 1 x daily - 2 sets - 10 reps - Seated Pelvic Floor Contraction  - 1 x daily - 2 sets - 10 reps - Supine Sciatic Nerve Glide  - 1-2 x daily - 7 x weekly  - 10 reps - Bird Dog  - 3 x weekly - 2 sets - 10 reps - 5 second hold - Dead Bug  - 3 x weekly - 3 sets - 5-10 reps - Barbell Thruster  - 3 x weekly - 3 sets - 10 reps - Forearm Side Plank on Counter  - 3 x weekly - 3 sets - 30 seconds hold - Standard Plank  - 3 x weekly - 5 sets - 15 seconds hold - Seated Piriformis Stretch with Trunk Bend  - 3 x weekly - 3-5 reps - 10-30 seconds hold  HOME EXERCISE PROGRAM [[email or web address]] View at [email or web address]" using code: [email or web address] SELF TRACTION - LEVEL 3 - H16 -   ASSESSMENT:  CLINICAL IMPRESSION:   Patient arrives to treatment session with complaints of L piriformis tightness. Session focused on manual stretching/STM and core strengthening. Tolerated session well. Encouraged patient to perform seated piriformis stretch at work and home to improve tightness. Patient would benefit from continued management of limiting condition by skilled physical therapist to address remaining impairments and functional limitations to work towards stated goals and return to PLOF or maximal functional independence.   From Re-Evaluation 11/19/2022:  Patient is a 32 y.o. 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 Patient is returning to PT more than 90 days since last PT visit to continue care for the same condition as before. She completed 5 physical therapy sessions with improvement in symptoms before her attendance lapsed. She now presents with worsening left LE symptoms and low back and left glute pain since her work environment changed. She had good response to physical therapy intervention in the past and good potential for improvement again with consistent attendance. Patient did demonstrate improvement in trunk endurance at today's goal  assessment. Patient presents with significant pain, paresthesia, muscle tension, motor control, muscle performance (strength/power/endurance), and activity tolerance impairments that are limiting ability to complete her usual activities such as working, walking, prolonged standing, bending, lifting, yardwork, housework, sleeping, traveling, prolonged sitting, going to the beach, anything that requires repetitive bending or lifting without difficulty. Patient will benefit from skilled physical therapy intervention to address current body structure impairments and activity limitations to improve function and work towards goals set in current POC in order to return to prior level of function or maximal functional improvement.   OBJECTIVE IMPAIRMENTS: Abnormal gait, decreased activity tolerance, decreased balance, decreased endurance, decreased knowledge of condition, decreased mobility, difficulty walking, decreased ROM, decreased strength, increased fascial restrictions, impaired perceived functional ability, increased muscle spasms, impaired flexibility, improper body mechanics, postural dysfunction, obesity, and pain.   ACTIVITY LIMITATIONS: carrying, lifting, bending, sitting, standing, squatting, stairs, transfers, hygiene/grooming, locomotion level, and caring for others  PARTICIPATION LIMITATIONS: meal prep, cleaning, laundry, driving, shopping, community activity, occupation, yard work, and   working, walking, prolonged standing, bending, lifting, yardwork, housework, sleeping, traveling, prolonged sitting, going to [employer], anything that requires repetitive bending or lifting   PERSONAL FACTORS: Fitness, Past/current experiences, Time since onset of injury/illness/exacerbation, and 1-2 comorbidities: lateral meniscus tear of left knee; Palpitation; Atypical chest pain; Family history of heart disease; Shortness of breath; and Sinus tachycardia on their problem list. past medical history of Acute  meniscal tear of knee, Allergy, Anxiety, Asthma, Atrial fibrillation (HCC), COVID-19 (04/2020), Depression, Ovarian cyst, PVC (premature ventricular contraction), Scoliosis, Shingles, and Tachycardia. past surgical history that includes RIGHT INDEX  FINGER TENDON REPAIR (NOV 2013); Knee arthroscopy with lateral menisectomy (Left, 09/01/2012); and Meniscus repair (Right)  are also affecting patient's functional outcome.   REHAB POTENTIAL: Good  CLINICAL DECISION MAKING: Evolving/moderate complexity  EVALUATION COMPLEXITY: Moderate   GOALS: Goals reviewed with patient? Yes  SHORT TERM GOALS: Target date: 08/07/22. Target date updated to 12/04/2022 on 11/19/2022.   Pt will be independent with initial HEP in order to improve strength and balance in order to decrease fall risk and improve function at home and work. Baseline:HEP given; patient not participating (11/19/2022); participating regularly (12/02/2022);  Goal status: MET   LONG TERM GOALS: Target date: 08/26/22. Target date updated to 02/11/2023 for all unmet goals on 11/19/2022. Target date updated to 04/14/2023 for all unmet goals on 01/20/2023.   Patient will demonstrate improved FOTO by equal or greater than 10 by visit #17 to demonstrate improvement in overall condition and self-reported functional ability.  Baseline: initial baseline 55 (07/01/2022); 60 at visit #17 (12/26/2022);  Goal status: in-progress  2.  Pt will decrease worst pain as reported on NPRS by at least 3 points in order to demonstrate clinically significant reduction in pain. Baseline: 4/10 (07/01/2022): up to  10/10  (11/19/2022); up to 4-5/10 when burning two days ago, 2.5/10 when excluding the burning (12/06/2022); 3-4/10 in the last 2 weeks (01/20/2023);  Goal status: on-going  3.  Pt will demonstrate plank time of or more to demonstrate age-predicted match of core strength needed for job duties Baseline: 10sec (07/01/2022): 24 seconds (11/19/2022); 23 seconds (12/26/2022); 37  seconds (01/20/2023);  Goal status: In-progress  4.  Pt will demonstrate bering sorenson test of 34sec or more to demonstrate clinically significant increase in spinal extensor strength needed for job duties Baseline: 20sec (07/01/2022); 58 seconds (11/19/2022);  Goal status: MET  5.  Patient will be independent with initial home exercise program for self-management of symptoms. Baseline: HEP to be reviewed and updated as needed at visit 7 as appropriate (11/19/22); she has has been participating 2x a week plus PT sessions + what  is left over after PT session (12/26/2022);  Goal status: MET    PLAN:  PT FREQUENCY: 1-2x/week  PT DURATION: 12 weeks  PLANNED INTERVENTIONS: Therapeutic exercises, Therapeutic activity, Neuromuscular re-education, Balance training, Gait training, Patient/Family education, Self Care, Joint mobilization, Stair training, Dry Needling, Electrical stimulation, Spinal mobilization, Cryotherapy, Moist heat, Manual therapy, and Re-evaluation.  PLAN FOR NEXT SESSION:  update HEP as appropriate, progressive core/LE/functional strengthening, consider repeated motions assessment, education. Manual therapy / dry needling as needed.   Maylon Peppers, PT, DPT  01/29/23, 4:47 PM  [facility] [address] [place], [place] [address] P: [phone] I F: [phone]

## 2023-01-29 ENCOUNTER — Other Ambulatory Visit: Payer: Self-pay

## 2023-01-29 ENCOUNTER — Encounter: Payer: Self-pay | Admitting: Physical Therapy

## 2023-01-29 ENCOUNTER — Ambulatory Visit: Payer: 59 | Attending: Physical Medicine & Rehabilitation

## 2023-01-29 DIAGNOSIS — M792 Neuralgia and neuritis, unspecified: Secondary | ICD-10-CM | POA: Insufficient documentation

## 2023-01-29 DIAGNOSIS — M5459 Other low back pain: Secondary | ICD-10-CM | POA: Insufficient documentation

## 2023-01-29 DIAGNOSIS — M62838 Other muscle spasm: Secondary | ICD-10-CM | POA: Diagnosis not present

## 2023-02-03 ENCOUNTER — Encounter: Payer: Self-pay | Admitting: Physical Therapy

## 2023-02-03 ENCOUNTER — Ambulatory Visit: Payer: 59 | Admitting: Physical Therapy

## 2023-02-03 DIAGNOSIS — M792 Neuralgia and neuritis, unspecified: Secondary | ICD-10-CM

## 2023-02-03 DIAGNOSIS — M5459 Other low back pain: Secondary | ICD-10-CM | POA: Diagnosis not present

## 2023-02-03 DIAGNOSIS — M62838 Other muscle spasm: Secondary | ICD-10-CM

## 2023-02-03 NOTE — Therapy (Addendum)
OUTPATIENT PHYSICAL THERAPY TREATMENT  Patient Name: Theresa Ortiz MRN: [id number] DOB:June 23, 1990, 32 y.o., female Today's Date: 02/03/2023  END OF SESSION:    PT End of Session - 02/03/23 1727     Visit Number 22    Number of Visits 25    Date for PT Re-Evaluation 04/14/23    Authorization Type [place] [employer] reporting period from 12/02/2022    Authorization Time Period VL: 25 PT/OT combined, Auth req after 25th visit    Authorization - Visit Number 22    Authorization - Number of Visits 25    Progress Note Due on Visit 25    PT Start Time 1648    PT Stop Time 1727    PT Time Calculation (min) 39 min    Activity Tolerance Patient tolerated treatment well    Behavior During Therapy WFL for tasks assessed/performed               Past Medical History:  Diagnosis Date   Acute meniscal tear of knee    left knee   Allergy    Anxiety    Asthma    Atrial fibrillation (HCC)    slight   COVID-19 04/2020   Depression    Ovarian cyst    PVC (premature ventricular contraction)    Scoliosis    Shingles    Tachycardia    Past Surgical History:  Procedure Laterality Date   KNEE ARTHROSCOPY WITH LATERAL MENISECTOMY Left 09/01/2012   Procedure: LEFT KNEE ARTHROSCOPY WITH LATERAL MENISCECTOMY ;  Surgeon: Jacki Cones, MD;  Location: [facility] [place];  Service: Orthopedics;  Laterality: Left;   MENISCUS REPAIR Right    RIGHT INDEX  FINGER TENDON REPAIR  NOV 2013   Patient Active Problem List   Diagnosis Date Noted   Palpitation 07/13/2018   Atypical chest pain 07/13/2018   Family history of heart disease 07/13/2018   Shortness of breath 07/13/2018   Sinus tachycardia 07/13/2018   Acute lateral meniscus tear of left knee 09/01/2012    PCP: Tisovec, Adelfa Koh, MD  REFERRING PROVIDER: Filomena Jungling, MD  REFERRING DIAG: 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 Pt reports she has some pain at the mid calf that is the size of packing tape that feels like a cramp. She reports she has tension at L low back and glute. LBP/glute pain aggravated by lifting/moving patients, and bending forward. Has had L sided sciatic symptoms 1x/week when lifting something heavy down post LLE. She hs previously had pain with prolonged walking, but that has subsided over the past year or so. Has had successful PT in the past with [facility], which she thinks is very helpful. Pt works full time as an Engineer, structural, enjoys traveling and walking her dog. Comorbid conditions as listed above. No falls in past 6 months.  denies N/V, B&B changes, unexplained weight fluctuation, saddle paresthesia, fever, night sweats, or unrelenting night pain at this time. Patient denies hx of cancer, stroke, seizures, diabetes, unexplained weight loss, unexplained changes in bowel or bladder problems, unexplained stumbling or dropping things, osteoporosis, and spinal surgery  SUBJECTIVE:  SUBJECTIVE STATEMENT:   Patient reports when she came to last PT session she started having tightness in her left leg when she bends over, lifts leg to get dressed, or step up onto stair. She also tweaked her back today when moving a patient who was intubated and she couldn't ask for more help. She had sharp pain in her low back. She has some discomfort in her left glute and medial posterior thigh. She has noticed it is the side to side motion that seems to bother her the most. She did  her HEP twice this week and is planning to count today as her 3rd. She tried the stretches from last PT session but. She is still doing lateral planks against the wall because her shoulder is still giving her some trouble.   PAIN:  NPRS: 0.5/10 left buttocks.   PATIENT GOALS: Get the cramp out of my calf, to "eleviate whatever pissed it off this time out of the leg"   OBJECTIVE  TODAY'S TREATMENT:   Therapeutic exercise: to centralize symptoms and improve ROM, strength, muscular endurance, and activity tolerance required for successful completion of functional activities.  - NuStep level 6 using bilateral upper and lower extremities. Seat/handle setting 9/9. For improved extremity mobility, muscular endurance, and activity tolerance; and to induce the analgesic effect of aerobic exercise, stimulate improved joint nutrition, and prepare body structures and systems for following interventions. x 6 minutes. Average SPM = 71.   Manual therapy: to reduce pain and tissue tension, improve range of motion, neuromodulation, in order to promote improved ability to complete functional activities. PRONE - STM to bilateral lumbar paraspinal muscles and bilateral posterior hip/glutes/deep rotators  Modality: Dry needling performed to low back and bilateral deep hip rotator region to decrease pain and spasms along patient's back, glute, and leg region with patient in prone utilizing 4 dry needle(s) .30mm x with 1 stick at R L4 and L5 multifidi, 2 sticks each at L L4 and L5 multifidi, 2 sticks at left piriformis region. Patient educated about the risks and benefits from therapy and verbally consents to treatment.  Dry needling performed by Luretha Murphy. [name] PT, DPT who is certified in this technique.  Pt required multimodal cuing for proper technique and to facilitate improved neuromuscular control, strength, range of motion, and functional ability resulting in improved performance and form.   PATIENT  EDUCATION:  Education details: Exercise purpose/form. Self management techniques. Education on diagnosis, prognosis, POC, anatomy and physiology of current condition. Education on dry needling.  Person educated: Patient Education method: Medical illustrator Education comprehension: verbalized understanding and returned demonstration  HOME EXERCISE PROGRAM: Access Code: [email or web address] URL: [email or web address] Date: 01/07/2023 Prepared by: Norton Blizzard  Exercises - Seated Pelvic Floor Elevators  - 1 x daily - 2 sets - 10 reps - Seated Quick Flick Pelvic Floor Contractions  - 1 x daily - 2 sets - 10 reps - Seated Pelvic Floor Contraction  - 1 x daily - 2 sets - 10 reps - Supine Sciatic Nerve Glide  - 1-2 x daily - 7 x weekly - 10 reps - Bird Dog  - 3 x weekly - 2 sets - 10 reps - 5 second hold - Dead Bug  - 3 x weekly - 3 sets - 5-10 reps - Barbell Thruster  - 3 x weekly - 3 sets - 10 reps - Forearm Side Plank on Counter  - 3 x weekly - 3 sets - 30 seconds hold - Standard Plank  -  3 x weekly - 5 sets - 15 seconds hold - Seated Piriformis Stretch with Trunk Bend  - 3 x weekly - 3-5 reps - 10-30 seconds hold  HOME EXERCISE PROGRAM [[email or web address]] View at [email or web address]" using code: [id number] SELF TRACTION - LEVEL 3 - H16 -   ASSESSMENT:  CLINICAL IMPRESSION:   Patient arrives reporting tension in the left glute and acute irritation in earlier in the day with moving a patient laterally. Today's session focused on symptom control with dry needling which decreased patient's discomfort. She did have the weak feeling in her back when getting up off the mat but notes this has improved overall. She was encouraged to continue her HEP at home for the dry needling to be of most use to her. Plan to utilized dry needling for pain control and continue with progressive strengthening as appropriate next PT session. Patient would benefit from continued management of limiting condition by  skilled physical therapist to address remaining impairments and functional limitations to work towards stated goals and return to PLOF or maximal functional independence.    From Re-Evaluation 11/19/2022:  Patient is a 32 y.o. 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 Patient is returning to PT more than 90 days since last PT visit to continue care for the same condition as before. She completed 5 physical therapy sessions with improvement in symptoms before her attendance lapsed. She now presents with worsening left LE symptoms and low back and left glute pain since her work environment changed. She had good response to physical therapy intervention in the past and good potential for improvement again with consistent attendance. Patient did demonstrate improvement in trunk endurance at today's goal assessment. Patient presents with significant pain, paresthesia, muscle tension, motor control, muscle performance (strength/power/endurance), and activity tolerance impairments that are limiting ability to complete her usual activities such as working, walking, prolonged standing, bending, lifting, yardwork, housework, sleeping, traveling, prolonged sitting, going to the beach, anything that requires repetitive bending or lifting without difficulty. Patient will benefit from skilled physical therapy intervention to address current body structure impairments and activity limitations to improve function and work towards goals set in current POC in order to return to prior level of function or maximal functional improvement.   OBJECTIVE IMPAIRMENTS: Abnormal gait, decreased activity tolerance, decreased balance, decreased endurance, decreased knowledge of condition, decreased  mobility, difficulty walking, decreased ROM, decreased strength, increased fascial restrictions, impaired perceived functional ability, increased muscle spasms, impaired flexibility, improper body mechanics, postural dysfunction, obesity, and pain.   ACTIVITY LIMITATIONS: carrying, lifting, bending, sitting, standing, squatting, stairs, transfers, hygiene/grooming, locomotion level, and caring for others  PARTICIPATION LIMITATIONS: meal prep, cleaning, laundry, driving, shopping, community activity, occupation, yard work, and   working, walking, prolonged standing, bending, lifting, yardwork, housework, sleeping, traveling, prolonged sitting, going to [employer], anything that requires repetitive bending or lifting   PERSONAL FACTORS: Fitness, Past/current experiences, Time since onset of injury/illness/exacerbation, and 1-2 comorbidities: lateral meniscus tear of left knee; Palpitation; Atypical chest pain; Family history of heart disease; Shortness of breath; and Sinus tachycardia on their problem list. past medical history of Acute meniscal tear of knee, Allergy, Anxiety, Asthma, Atrial fibrillation (HCC), COVID-19 (04/2020), Depression, Ovarian cyst, PVC (premature ventricular contraction), Scoliosis, Shingles, and Tachycardia. past surgical history that includes RIGHT INDEX  FINGER TENDON  REPAIR (NOV 2013); Knee arthroscopy with lateral menisectomy (Left, 09/01/2012); and Meniscus repair (Right)  are also affecting patient's functional outcome.   REHAB POTENTIAL: Good  CLINICAL DECISION MAKING: Evolving/moderate complexity  EVALUATION COMPLEXITY: Moderate   GOALS: Goals reviewed with patient? Yes  SHORT TERM GOALS: Target date: 08/07/22. Target date updated to 12/04/2022 on 11/19/2022.   Pt will be independent with initial HEP in order to improve strength and balance in order to decrease fall risk and improve function at home and work. Baseline:HEP given; patient not participating (11/19/2022);  participating regularly (12/02/2022);  Goal status: MET   LONG TERM GOALS: Target date: 08/26/22. Target date updated to 02/11/2023 for all unmet goals on 11/19/2022. Target date updated to 04/14/2023 for all unmet goals on 01/20/2023.   Patient will demonstrate improved FOTO by equal or greater than 10 by visit #17 to demonstrate improvement in overall condition and self-reported functional ability.  Baseline: initial baseline 55 (07/01/2022); 60 at visit #17 (12/26/2022);  Goal status: in-progress  2.  Pt will decrease worst pain as reported on NPRS by at least 3 points in order to demonstrate clinically significant reduction in pain. Baseline: 4/10 (07/01/2022): up to 10/10  (11/19/2022); up to 4-5/10 when burning two days ago, 2.5/10 when excluding the burning (12/06/2022); 3-4/10 in the last 2 weeks (01/20/2023);  Goal status: on-going  3.  Pt will demonstrate plank time of or more to demonstrate age-predicted match of core strength needed for job duties Baseline: 10sec (07/01/2022): 24 seconds (11/19/2022); 23 seconds (12/26/2022); 37 seconds (01/20/2023);  Goal status: In-progress  4.  Pt will demonstrate bering sorenson test of 34sec or more to demonstrate clinically significant increase in spinal extensor strength needed for job duties Baseline: 20sec (07/01/2022); 58 seconds (11/19/2022);  Goal status: MET  5.  Patient will be independent with initial home exercise program for self-management of symptoms. Baseline: HEP to be reviewed and updated as needed at visit 7 as appropriate (11/19/22); she has has been participating 2x a week plus PT sessions + what  is left over after PT session (12/26/2022);  Goal status: MET    PLAN:  PT FREQUENCY: 1-2x/week  PT DURATION: 12 weeks  PLANNED INTERVENTIONS: Therapeutic exercises, Therapeutic activity, Neuromuscular re-education, Balance training, Gait training, Patient/Family education, Self Care, Joint mobilization, Stair training, Dry Needling,  Electrical stimulation, Spinal mobilization, Cryotherapy, Moist heat, Manual therapy, and Re-evaluation.  PLAN FOR NEXT SESSION:  update HEP as appropriate, progressive core/LE/functional strengthening, consider repeated motions assessment, education. Manual therapy / dry needling as needed.   Luretha Murphy. [name], PT, DPT 02/03/23, 7:40 PM  [facility] [address] [place], [place] [address] P: [phone] I F: [phone]

## 2023-02-05 ENCOUNTER — Other Ambulatory Visit: Payer: Self-pay

## 2023-02-05 ENCOUNTER — Encounter: Payer: 59 | Admitting: Physical Therapy

## 2023-02-05 DIAGNOSIS — R718 Other abnormality of red blood cells: Secondary | ICD-10-CM | POA: Diagnosis not present

## 2023-02-05 DIAGNOSIS — R768 Other specified abnormal immunological findings in serum: Secondary | ICD-10-CM | POA: Diagnosis not present

## 2023-02-05 DIAGNOSIS — K219 Gastro-esophageal reflux disease without esophagitis: Secondary | ICD-10-CM | POA: Diagnosis not present

## 2023-02-05 DIAGNOSIS — R748 Abnormal levels of other serum enzymes: Secondary | ICD-10-CM | POA: Diagnosis not present

## 2023-02-05 DIAGNOSIS — N3281 Overactive bladder: Secondary | ICD-10-CM | POA: Diagnosis not present

## 2023-02-05 DIAGNOSIS — D72829 Elevated white blood cell count, unspecified: Secondary | ICD-10-CM | POA: Diagnosis not present

## 2023-02-05 DIAGNOSIS — F900 Attention-deficit hyperactivity disorder, predominantly inattentive type: Secondary | ICD-10-CM | POA: Diagnosis not present

## 2023-02-07 ENCOUNTER — Other Ambulatory Visit: Payer: Self-pay

## 2023-02-07 ENCOUNTER — Telehealth: Payer: 59 | Admitting: Emergency Medicine

## 2023-02-07 DIAGNOSIS — B309 Viral conjunctivitis, unspecified: Secondary | ICD-10-CM

## 2023-02-07 MED ORDER — TOBRAMYCIN-DEXAMETHASONE 0.3-0.1 % OP SUSP
1.0000 [drp] | Freq: Four times a day (QID) | OPHTHALMIC | 0 refills | Status: DC
  Filled 2023-02-07: qty 5, 25d supply, fill #0

## 2023-02-07 NOTE — Progress Notes (Signed)
E-Visit for [employer]   We are sorry that you are not feeling well.  Here is how we plan to help!  Based on what you have shared with me it looks like you have conjunctivitis.  Conjunctivitis is a common inflammatory or infectious condition of the eye that is often referred to as "pink eye".  In many cases it is contagious (viral or bacterial- most contagious pink eye is viral). However, not all conjunctivitis requires antibiotics (ex. Allergic and viral).  We have made appropriate suggestions for you based upon your presentation.  I recommend that you use OpconA, 1-2 drops every 4-6 hours (an over the counter allergy drop available at your local pharmacy).  Your pharmacist may have an alternative suggestion.  Pink eye can be highly contagious.  It is typically spread through direct contact with secretions, or contaminated objects or surfaces that one may have touched.  Strict handwashing is suggested with soap and water is urged.  If not available, use alcohol based had sanitizer.  Avoid unnecessary touching of the eye.  If you wear contact lenses, you will need to refrain from wearing them until you see no white discharge from the eye for at least 24 hours after being on medication.  You should see symptom improvement in 1-2 days after starting the medication regimen.  Call us if symptoms are not improved in 1-2 days.  Home Care: Wash your hands often! Do not wear your contacts until you complete your treatment plan. Avoid sharing towels, bed linen, personal items with a person who has pink eye. See attention for anyone in your home with similar symptoms.  Get Help Right Away If: Your symptoms do not improve. You develop blurred or loss of vision. Your symptoms worsen (increased discharge, pain or redness)   Thank you for choosing an e-visit.  Your e-visit answers were reviewed by a board certified advanced clinical practitioner to complete your personal care plan. Depending upon the  condition, your plan could have included both over the counter or prescription medications.  Please review your pharmacy choice. Make sure the pharmacy is open so you can pick up prescription now. If there is a problem, you may contact your provider through [employer] and have the prescription routed to another pharmacy.  Your safety is important to [place]. If you have drug allergies check your prescription carefully.   For the next 24 hours you can use MyChart to ask questions about today's visit, request a non-urgent call back, or ask for a work or school excuse. You will get an email in the next two days asking about your experience. I hope that your e-visit has been valuable and will speed your recovery.   I have spent 5 minutes in review of e-visit questionnaire, review and updating patient chart, medical decision making and response to patient.   Rica Mast, PhD, FNP-BC

## 2023-02-10 ENCOUNTER — Encounter: Payer: 59 | Admitting: Physical Therapy

## 2023-02-12 ENCOUNTER — Encounter: Payer: Self-pay | Admitting: Physical Therapy

## 2023-02-12 ENCOUNTER — Ambulatory Visit: Payer: 59 | Admitting: Physical Therapy

## 2023-02-12 DIAGNOSIS — M5459 Other low back pain: Secondary | ICD-10-CM | POA: Diagnosis not present

## 2023-02-12 DIAGNOSIS — M62838 Other muscle spasm: Secondary | ICD-10-CM | POA: Diagnosis not present

## 2023-02-12 DIAGNOSIS — M792 Neuralgia and neuritis, unspecified: Secondary | ICD-10-CM | POA: Diagnosis not present

## 2023-02-12 NOTE — Therapy (Signed)
OUTPATIENT PHYSICAL THERAPY TREATMENT  Patient Name: Theresa Ortiz MRN: [id number] DOB:Apr 26, 1991, 32 y.o., female Today's Date: 02/12/2023  END OF SESSION:    PT End of Session - 02/12/23 1652     Visit Number 23    Number of Visits 25    Date for PT Re-Evaluation 04/14/23    Authorization Type [facility] [place] [employer] FOCUS reporting period from 12/02/2022    Authorization Time Period VL: 25 PT/OT combined, Auth req after 25th visit    Authorization - Visit Number 23    Authorization - Number of Visits 25    Progress Note Due on Visit 25    PT Start Time 1647    PT Stop Time 1725    PT Time Calculation (min) 38 min    Activity Tolerance Patient tolerated treatment well    Behavior During Therapy [facility] for tasks assessed/performed             Past Medical History:  Diagnosis Date   Acute meniscal tear of knee    left knee   Allergy    Anxiety    Asthma    Atrial fibrillation (HCC)    slight   COVID-19 04/2020   Depression    Ovarian cyst    PVC (premature ventricular contraction)    Scoliosis    Shingles    Tachycardia    Past Surgical History:  Procedure Laterality Date   KNEE ARTHROSCOPY WITH LATERAL MENISECTOMY Left 09/01/2012   Procedure: LEFT KNEE ARTHROSCOPY WITH LATERAL MENISCECTOMY ;  Surgeon: Jacki Cones, MD;  Location: [facility] [place];  Service: Orthopedics;  Laterality: Left;   MENISCUS REPAIR Right    RIGHT INDEX  FINGER TENDON REPAIR  NOV 2013   Patient Active Problem List   Diagnosis Date Noted   Palpitation 07/13/2018   Atypical chest pain 07/13/2018   Family history of heart disease 07/13/2018   Shortness of breath 07/13/2018   Sinus tachycardia 07/13/2018   Acute lateral meniscus tear of left knee 09/01/2012    PCP: Tisovec, Adelfa Koh, MD  REFERRING PROVIDER: Filomena Jungling, MD  REFERRING DIAG: 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 Pt reports she has some pain at the mid calf that is the size of packing tape that feels like a cramp. She reports she has tension at L low back and glute. LBP/glute pain aggravated by lifting/moving patients, and bending forward. Has had L sided sciatic symptoms 1x/week when lifting something heavy down post LLE. She hs previously had pain with prolonged walking, but that has subsided over the past year or so. Has had successful PT in the past with [facility], which she thinks is very helpful. Pt works full time as an Engineer, structural, enjoys traveling and walking her dog. Comorbid conditions as listed above. No falls in past 6 months.  denies N/V, B&B changes, unexplained weight fluctuation, saddle paresthesia, fever, night sweats, or unrelenting night pain at this time. Patient denies hx of cancer, stroke, seizures, diabetes, unexplained weight loss, unexplained changes in bowel or bladder problems, unexplained stumbling or dropping things, osteoporosis, and spinal surgery  SUBJECTIVE:  SUBJECTIVE STATEMENT:   Patient reports she has noticed for the last two weeks her left calf pain has been less/gone. She states she has been doing posterior hip stretching more often since she got back from the beach. She states she continues to have some trouble with side to side movements and dull pain in her left low back and proximal posterior thigh. She states she took Thursday and Monday off, and Tuesday felt like the longest day ever (yesterday) and she feels like everything is  more achy. She feels like she hasn't really recovered from yesterday. HEP has not been going that great since last week. She was gone Thursday to Monday because it was her birthday weekend. She thinks the dry needling helped some last PT session but did not feel like it got to the glute position,   PAIN:  NPRS: 1/10 left low back and left proximal posterior thigh.   PATIENT GOALS: Get the cramp out of my calf, to "eleviate whatever pissed it off this time out of the leg"   OBJECTIVE  TODAY'S TREATMENT:   Therapeutic exercise: to centralize symptoms and improve ROM, strength, muscular endurance, and activity tolerance required for successful completion of functional activities.  - NuStep level 6 using bilateral upper and lower extremities. Seat/handle setting 9/9. For improved extremity mobility, muscular endurance, and activity tolerance; and to induce the analgesic effect of aerobic exercise, stimulate improved joint nutrition, and prepare body structures and systems for following interventions. x 6 minutes. Average SPM = 75.  (Manual therapy / dry needling - see below) - standing pallof press, 1x5-10 with blackTB/Silver TB with anchor on the left with instructions on how to perform at home. Well tolerated and good form noted.  - Education on HEP including handout   Manual therapy: to reduce pain and tissue tension, improve range of motion, neuromodulation, in order to promote improved ability to complete functional activities. PRONE - STM to left posterior hip/glutes/deep rotators  Modality: Dry needling performed to left posterior hip region to decrease pain and spasms along patient's glute and leg region with patient in prone utilizing 2 dry needle(s) .30mm x with 1 stick at upper glutes just lateral to L SIJ, 1 stick at lateral upper glute max, and 2 sticks at left piriformis region. Patient educated about the risks and benefits from therapy and verbally consents to treatment.  Dry  needling performed by Luretha Murphy. [name] PT, DPT who is certified in this technique.  Pt required multimodal cuing for proper technique and to facilitate improved neuromuscular control, strength, range of motion, and functional ability resulting in improved performance and form.  PATIENT EDUCATION:  Education details: Exercise purpose/form. Self management techniques. Education on diagnosis, prognosis, POC, anatomy and physiology of current condition. Education on dry needling.  Person educated: Patient Education method: Medical illustrator Education comprehension: verbalized understanding and returned demonstration  HOME EXERCISE PROGRAM: Access Code: [email or web address] URL: [email or web address] Date: 02/12/2023 Prepared by: Norton Blizzard  Exercises - Seated Pelvic Floor Elevators  - 1 x daily - 2 sets - 10 reps - Seated Quick Flick Pelvic Floor Contractions  - 1 x daily - 2 sets - 10 reps - Seated Pelvic Floor Contraction  - 1 x daily - 2 sets - 10 reps - Supine Sciatic Nerve Glide  - 1-2 x daily - 7 x weekly - 10 reps - Bird Dog  - 3 x weekly - 2 sets - 10 reps - 5 second hold - Dead Bug  -  3 x weekly - 3 sets - 5-10 reps - Barbell Thruster  - 3 x weekly - 3 sets - 10 reps - Forearm Side Plank on Counter  - 3 x weekly - 3 sets - 30 seconds hold - Standard Plank  - 3 x weekly - 5 sets - 15 seconds hold - Seated Piriformis Stretch with Trunk Bend  - 3 x weekly - 3-5 reps - 10-30 seconds hold - Standing Anti-Rotation Press with Anchored Resistance  - 3 x weekly - 3 sets - 10 reps - 5 seconds hold  HOME EXERCISE PROGRAM [[email or web address]] View at [email or web address]" using code: [id number] SELF TRACTION - LEVEL 3 - H16 -   ASSESSMENT:  CLINICAL IMPRESSION:   Patient arrives reporting improved left lower leg symptoms and seems to be better overall. Continued with dry needling at more varied positions, addressing more superior regions in the L glutes and near the L SIJ. Patient had  several twitch responses and reproduction of pain with relief following at the medial proximal left thigh region. Updated HEP to include pallof press to further address pain with lateral movements and rotation while at work. Patient tolerated session well and reported decreased discomfort by end of session. Patient would benefit from continued management of limiting condition by skilled physical therapist to address remaining impairments and functional limitations to work towards stated goals and return to PLOF or maximal functional independence.   From Re-Evaluation 11/19/2022:  Patient is a 32 y.o. 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 Patient is returning to PT more than 90 days since last PT visit to continue care for the same condition as before. She completed 5 physical therapy sessions with improvement in symptoms before her attendance lapsed. She now presents with worsening left LE symptoms and low back and left glute pain since her work environment changed. She had good response to physical therapy intervention in the past and good potential for improvement again with consistent attendance. Patient did demonstrate improvement in trunk endurance at today's goal assessment. Patient presents with significant pain, paresthesia, muscle tension, motor control, muscle performance (strength/power/endurance), and activity tolerance impairments that are limiting ability to complete her usual activities such as working, walking, prolonged standing, bending, lifting, yardwork, housework, sleeping, traveling, prolonged sitting, going to the beach, anything that requires repetitive bending or lifting without difficulty. Patient will benefit from skilled physical therapy  intervention to address current body structure impairments and activity limitations to improve function and work towards goals set in current POC in order to return to prior level of function or maximal functional improvement.   OBJECTIVE IMPAIRMENTS: Abnormal gait, decreased activity tolerance, decreased balance, decreased endurance, decreased knowledge of condition, decreased mobility, difficulty walking, decreased ROM, decreased strength, increased fascial restrictions, impaired perceived functional ability, increased muscle spasms, impaired flexibility, improper body mechanics, postural dysfunction, obesity, and pain.   ACTIVITY LIMITATIONS: carrying, lifting, bending, sitting, standing, squatting, stairs, transfers, hygiene/grooming, locomotion level, and caring for others  PARTICIPATION LIMITATIONS: meal prep, cleaning, laundry, driving, shopping, community activity, occupation, yard work, and   working, walking, prolonged standing, bending, lifting, yardwork, housework, sleeping, traveling, prolonged sitting, going to [employer], anything that requires repetitive bending or lifting   PERSONAL FACTORS: Fitness, Past/current experiences, Time since onset of injury/illness/exacerbation, and 1-2 comorbidities: lateral meniscus tear of left  knee; Palpitation; Atypical chest pain; Family history of heart disease; Shortness of breath; and Sinus tachycardia on their problem list. past medical history of Acute meniscal tear of knee, Allergy, Anxiety, Asthma, Atrial fibrillation (HCC), COVID-19 (04/2020), Depression, Ovarian cyst, PVC (premature ventricular contraction), Scoliosis, Shingles, and Tachycardia. past surgical history that includes RIGHT INDEX  FINGER TENDON REPAIR (NOV 2013); Knee arthroscopy with lateral menisectomy (Left, 09/01/2012); and Meniscus repair (Right)  are also affecting patient's functional outcome.   REHAB POTENTIAL: Good  CLINICAL DECISION MAKING: Evolving/moderate  complexity  EVALUATION COMPLEXITY: Moderate   GOALS: Goals reviewed with patient? Yes  SHORT TERM GOALS: Target date: 08/07/22. Target date updated to 12/04/2022 on 11/19/2022.   Pt will be independent with initial HEP in order to improve strength and balance in order to decrease fall risk and improve function at home and work. Baseline:HEP given; patient not participating (11/19/2022); participating regularly (12/02/2022);  Goal status: MET   LONG TERM GOALS: Target date: 08/26/22. Target date updated to 02/11/2023 for all unmet goals on 11/19/2022. Target date updated to 04/14/2023 for all unmet goals on 01/20/2023.   Patient will demonstrate improved FOTO by equal or greater than 10 by visit #17 to demonstrate improvement in overall condition and self-reported functional ability.  Baseline: initial baseline 55 (07/01/2022); 60 at visit #17 (12/26/2022);  Goal status: in-progress  2.  Pt will decrease worst pain as reported on NPRS by at least 3 points in order to demonstrate clinically significant reduction in pain. Baseline: 4/10 (07/01/2022): up to 10/10  (11/19/2022); up to 4-5/10 when burning two days ago, 2.5/10 when excluding the burning (12/06/2022); 3-4/10 in the last 2 weeks (01/20/2023);  Goal status: on-going  3.  Pt will demonstrate plank time of or more to demonstrate age-predicted match of core strength needed for job duties Baseline: 10sec (07/01/2022): 24 seconds (11/19/2022); 23 seconds (12/26/2022); 37 seconds (01/20/2023);  Goal status: In-progress  4.  Pt will demonstrate bering sorenson test of 34sec or more to demonstrate clinically significant increase in spinal extensor strength needed for job duties Baseline: 20sec (07/01/2022); 58 seconds (11/19/2022);  Goal status: MET  5.  Patient will be independent with initial home exercise program for self-management of symptoms. Baseline: HEP to be reviewed and updated as needed at visit 7 as appropriate (11/19/22); she has has been  participating 2x a week plus PT sessions + what  is left over after PT session (12/26/2022);  Goal status: MET    PLAN:  PT FREQUENCY: 1-2x/week  PT DURATION: 12 weeks  PLANNED INTERVENTIONS: Therapeutic exercises, Therapeutic activity, Neuromuscular re-education, Balance training, Gait training, Patient/Family education, Self Care, Joint mobilization, Stair training, Dry Needling, Electrical stimulation, Spinal mobilization, Cryotherapy, Moist heat, Manual therapy, and Re-evaluation.  PLAN FOR NEXT SESSION:  update HEP as appropriate, progressive core/LE/functional strengthening, consider repeated motions assessment, education. Manual therapy / dry needling as needed.   Luretha Murphy. [name], PT, DPT 02/12/23, 6:46 PM  [facility] [address] [place], [place] [address] P: [phone] I F: [phone]

## 2023-02-19 DIAGNOSIS — Z113 Encounter for screening for infections with a predominantly sexual mode of transmission: Secondary | ICD-10-CM | POA: Diagnosis not present

## 2023-02-19 DIAGNOSIS — Z124 Encounter for screening for malignant neoplasm of cervix: Secondary | ICD-10-CM | POA: Diagnosis not present

## 2023-02-19 DIAGNOSIS — Z01411 Encounter for gynecological examination (general) (routine) with abnormal findings: Secondary | ICD-10-CM | POA: Diagnosis not present

## 2023-02-19 DIAGNOSIS — Z01419 Encounter for gynecological examination (general) (routine) without abnormal findings: Secondary | ICD-10-CM | POA: Diagnosis not present

## 2023-02-19 DIAGNOSIS — Z1331 Encounter for screening for depression: Secondary | ICD-10-CM | POA: Diagnosis not present

## 2023-02-20 ENCOUNTER — Encounter: Payer: Self-pay | Admitting: Physical Therapy

## 2023-02-20 ENCOUNTER — Other Ambulatory Visit: Payer: Self-pay

## 2023-02-20 ENCOUNTER — Ambulatory Visit: Payer: 59 | Admitting: Physical Therapy

## 2023-02-20 DIAGNOSIS — M62838 Other muscle spasm: Secondary | ICD-10-CM | POA: Diagnosis not present

## 2023-02-20 DIAGNOSIS — M5459 Other low back pain: Secondary | ICD-10-CM

## 2023-02-20 DIAGNOSIS — M792 Neuralgia and neuritis, unspecified: Secondary | ICD-10-CM | POA: Diagnosis not present

## 2023-02-20 MED ORDER — FOLIC ACID 1 MG PO TABS
1.0000 mg | ORAL_TABLET | Freq: Every day | ORAL | 3 refills | Status: AC
  Filled 2023-02-20 – 2023-03-18 (×2): qty 90, 90d supply, fill #0
  Filled 2023-07-07: qty 90, 90d supply, fill #1
  Filled 2023-09-23: qty 90, 90d supply, fill #2
  Filled 2024-02-09: qty 90, 90d supply, fill #3

## 2023-02-20 NOTE — Therapy (Signed)
OUTPATIENT PHYSICAL THERAPY TREATMENT  Patient Name: Theresa Ortiz MRN: [id number] DOB:1990/09/09, 32 y.o., female Today's Date: 02/20/2023  END OF SESSION:    PT End of Session - 02/20/23 1745     Visit Number 24    Number of Visits 25    Date for PT Re-Evaluation 04/14/23    Authorization Type  [employer] FOCUS reporting period from 12/02/2022    Authorization Time Period VL: 25 PT/OT combined, Auth req after 25th visit    Authorization - Visit Number 24    Authorization - Number of Visits 25    Progress Note Due on Visit 25    PT Start Time 1738    PT Stop Time 1816    PT Time Calculation (min) 38 min    Activity Tolerance Patient tolerated treatment well    Behavior During Therapy [facility] for tasks assessed/performed              Past Medical History:  Diagnosis Date   Acute meniscal tear of knee    left knee   Allergy    Anxiety    Asthma    Atrial fibrillation (HCC)    slight   COVID-19 04/2020   Depression    Ovarian cyst    PVC (premature ventricular contraction)    Scoliosis    Shingles    Tachycardia    Past Surgical History:  Procedure Laterality Date   KNEE ARTHROSCOPY WITH LATERAL MENISECTOMY Left 09/01/2012   Procedure: LEFT KNEE ARTHROSCOPY WITH LATERAL MENISCECTOMY ;  Surgeon: Jacki Cones, MD;  Location: [facility];  Service: Orthopedics;  Laterality: Left;   MENISCUS REPAIR Right    RIGHT INDEX  FINGER TENDON REPAIR  NOV 2013   Patient Active Problem List   Diagnosis Date Noted   Palpitation 07/13/2018   Atypical chest pain 07/13/2018   Family history of heart disease 07/13/2018   Shortness of breath 07/13/2018   Sinus tachycardia 07/13/2018   Acute lateral meniscus tear of left knee 09/01/2012    PCP: Tisovec, Adelfa Koh, MD  REFERRING PROVIDER: Filomena Jungling, MD  REFERRING DIAG: 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 Pt reports she has some pain at the mid calf that is the size of packing tape that feels like a cramp. She reports she has tension at L low back and glute. LBP/glute pain aggravated by lifting/moving patients, and bending forward. Has had L sided sciatic symptoms 1x/week when lifting something heavy down post LLE. She hs previously had pain with prolonged walking, but that has subsided over the past year or so. Has had successful PT in the past with [facility], which she thinks is very helpful. Pt works full time as an Engineer, structural, enjoys traveling and walking her dog. Comorbid conditions as listed above. No falls in past 6 months.  denies N/V, B&B changes, unexplained weight fluctuation, saddle paresthesia, fever, night sweats, or unrelenting night pain at this time. Patient denies hx of cancer, stroke, seizures, diabetes, unexplained weight loss, unexplained changes in bowel or bladder problems, unexplained stumbling or dropping things, osteoporosis, and spinal surgery  SUBJECTIVE:  SUBJECTIVE STATEMENT:   Patient reports she has been better. She states it has been a "hellacious week" and she had a patient who was about 300# put all of her weight on her shoulders when patient was helping her move (even after patient told her patient she could only assist with light balance). Patient states her back was immediately irritated and then it gradually got worse. The pain is on the left low back down into the left upper glute and into the thoracic region where she  originally had pain in the past. Patient states she was feeling good after last PT session until her patient hurt her. She states HEP has been going better.   PAIN:  NPRS: 3/10 left low back, low back, thoracic spine and off/on in left proximal posterior thigh. Left foot was kind of numb today. Felt like calf wanted to cramp again.   PATIENT GOALS: Get the cramp out of my calf, to "eleviate whatever pissed it off this time out of the leg"   OBJECTIVE  TODAY'S TREATMENT:   Therapeutic exercise: to centralize symptoms and improve ROM, strength, muscular endurance, and activity tolerance required for successful completion of functional activities.  - NuStep level 6 using bilateral upper and lower extremities. Seat/handle setting 9/9. For improved extremity mobility, muscular endurance, and activity tolerance; and to induce the analgesic effect of aerobic exercise, stimulate improved joint nutrition, and prepare body structures and systems for following interventions. x 6 minutes. Average SPM = 73.   Manual therapy: to reduce pain and tissue tension, improve range of motion, neuromodulation, in order to promote improved ability to complete functional activities. PRONE - STM to B lumbar paraspinals, left posterior hip/glutes/deep rotators - CPA and right UPA, grade III-IV at lumbar and lower thoracic spinal segments  Modality: Dry needling performed to B lumbar paraspinals and left posterior hip region to decrease pain and spasms along patient's glute and leg region with patient in prone utilizing 4 dry needle(s) .30mm x with 1 stick each at R and L lumbar multifidi at L4 and L5, 2 sticks at upper glutes just lateral to L SIJ,  and 2 sticks at left piriformis region. Patient educated about the risks and benefits from therapy and verbally consents to treatment.  Dry needling performed by Luretha Murphy. [name] PT, DPT who is certified in this technique.  Pt required multimodal cuing for proper  technique and to facilitate improved neuromuscular control, strength, range of motion, and functional ability resulting in improved performance and form.  PATIENT EDUCATION:  Education details: Exercise purpose/form. Self management techniques. Education on diagnosis, prognosis, POC, anatomy and physiology of current condition. Education on dry needling.  Person educated: Patient Education method: Medical illustrator Education comprehension: verbalized understanding and returned demonstration  HOME EXERCISE PROGRAM: Access Code: [email or web address] URL: [email or web address] Date: 02/12/2023 Prepared by: Norton Blizzard  Exercises - Seated Pelvic Floor Elevators  - 1 x daily - 2 sets - 10 reps - Seated Quick Flick Pelvic Floor Contractions  - 1 x daily - 2 sets - 10 reps - Seated Pelvic Floor Contraction  - 1 x daily - 2 sets - 10 reps - Supine Sciatic Nerve Glide  - 1-2 x daily - 7 x weekly - 10 reps - Bird Dog  - 3 x weekly - 2 sets - 10 reps - 5 second hold - Dead Bug  - 3 x weekly - 3 sets - 5-10 reps - Barbell Thruster  - 3 x weekly - 3 sets -  10 reps - Forearm Side Plank on Counter  - 3 x weekly - 3 sets - 30 seconds hold - Standard Plank  - 3 x weekly - 5 sets - 15 seconds hold - Seated Piriformis Stretch with Trunk Bend  - 3 x weekly - 3-5 reps - 10-30 seconds hold - Standing Anti-Rotation Press with Anchored Resistance  - 3 x weekly - 3 sets - 10 reps - 5 seconds hold  HOME EXERCISE PROGRAM [[email or web address]] View at [email or web address]" using code: [email or web address] SELF TRACTION - LEVEL 3 - H16 -   ASSESSMENT:  CLINICAL IMPRESSION:   Patient arrives reporting increased symptoms after her patient unexpectedly loaded her from her shoulders last Thursday and she had a hard work week this week. She reported improvements in symptoms after manual therapy and dry needling. She appears to be improving her HEP participation and she continued to be encouraged on continued participation and  its importance. She reported feeling better by the end of the session. Patient would benefit from continued management of limiting condition by skilled physical therapist to address remaining impairments and functional limitations to work towards stated goals and return to PLOF or maximal functional independence.    From Re-Evaluation 11/19/2022:  Patient is a 32 y.o. 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 Patient is returning to PT more than 90 days since last PT visit to continue care for the same condition as before. She completed 5 physical therapy sessions with improvement in symptoms before her attendance lapsed. She now presents with worsening left LE symptoms and low back and left glute pain since her work environment changed. She had good response to physical therapy intervention in the past and good potential for improvement again with consistent attendance. Patient did demonstrate improvement in trunk endurance at today's goal assessment. Patient presents with significant pain, paresthesia, muscle tension, motor control, muscle performance (strength/power/endurance), and activity tolerance impairments that are limiting ability to complete her usual activities such as working, walking, prolonged standing, bending, lifting, yardwork, housework, sleeping, traveling, prolonged sitting, going to the beach, anything that requires repetitive bending or lifting without difficulty. Patient will benefit from skilled physical therapy intervention to address current body structure impairments and activity limitations to improve function and work towards goals set in current POC in order to return to prior level of function or maximal functional improvement.   OBJECTIVE  IMPAIRMENTS: Abnormal gait, decreased activity tolerance, decreased balance, decreased endurance, decreased knowledge of condition, decreased mobility, difficulty walking, decreased ROM, decreased strength, increased fascial restrictions, impaired perceived functional ability, increased muscle spasms, impaired flexibility, improper body mechanics, postural dysfunction, obesity, and pain.   ACTIVITY LIMITATIONS: carrying, lifting, bending, sitting, standing, squatting, stairs, transfers, hygiene/grooming, locomotion level, and caring for others  PARTICIPATION LIMITATIONS: meal prep, cleaning, laundry, driving, shopping, community activity, occupation, yard work, and   working, walking, prolonged standing, bending, lifting, yardwork, housework, sleeping, traveling, prolonged sitting, going to [employer], anything that requires repetitive bending or lifting   PERSONAL FACTORS: Fitness, Past/current experiences, Time since onset of injury/illness/exacerbation, and 1-2 comorbidities: lateral meniscus tear of left knee; Palpitation; Atypical chest pain; Family history of heart disease; Shortness of breath; and Sinus tachycardia on their problem list. past medical history of Acute meniscal tear of knee, Allergy, Anxiety, Asthma, Atrial fibrillation (HCC), COVID-19 (04/2020), Depression,  Ovarian cyst, PVC (premature ventricular contraction), Scoliosis, Shingles, and Tachycardia. past surgical history that includes RIGHT INDEX  FINGER TENDON REPAIR (NOV 2013); Knee arthroscopy with lateral menisectomy (Left, 09/01/2012); and Meniscus repair (Right)  are also affecting patient's functional outcome.   REHAB POTENTIAL: Good  CLINICAL DECISION MAKING: Evolving/moderate complexity  EVALUATION COMPLEXITY: Moderate   GOALS: Goals reviewed with patient? Yes  SHORT TERM GOALS: Target date: 08/07/22. Target date updated to 12/04/2022 on 11/19/2022.   Pt will be independent with initial HEP in order to improve strength and  balance in order to decrease fall risk and improve function at home and work. Baseline:HEP given; patient not participating (11/19/2022); participating regularly (12/02/2022);  Goal status: MET   LONG TERM GOALS: Target date: 08/26/22. Target date updated to 02/11/2023 for all unmet goals on 11/19/2022. Target date updated to 04/14/2023 for all unmet goals on 01/20/2023.   Patient will demonstrate improved FOTO by equal or greater than 10 by visit #17 to demonstrate improvement in overall condition and self-reported functional ability.  Baseline: initial baseline 55 (07/01/2022); 60 at visit #17 (12/26/2022);  Goal status: in-progress  2.  Pt will decrease worst pain as reported on NPRS by at least 3 points in order to demonstrate clinically significant reduction in pain. Baseline: 4/10 (07/01/2022): up to 10/10  (11/19/2022); up to 4-5/10 when burning two days ago, 2.5/10 when excluding the burning (12/06/2022); 3-4/10 in the last 2 weeks (01/20/2023);  Goal status: on-going  3.  Pt will demonstrate plank time of or more to demonstrate age-predicted match of core strength needed for job duties Baseline: 10sec (07/01/2022): 24 seconds (11/19/2022); 23 seconds (12/26/2022); 37 seconds (01/20/2023);  Goal status: In-progress  4.  Pt will demonstrate bering sorenson test of 34sec or more to demonstrate clinically significant increase in spinal extensor strength needed for job duties Baseline: 20sec (07/01/2022); 58 seconds (11/19/2022);  Goal status: MET  5.  Patient will be independent with initial home exercise program for self-management of symptoms. Baseline: HEP to be reviewed and updated as needed at visit 7 as appropriate (11/19/22); she has has been participating 2x a week plus PT sessions + what  is left over after PT session (12/26/2022);  Goal status: MET    PLAN:  PT FREQUENCY: 1-2x/week  PT DURATION: 12 weeks  PLANNED INTERVENTIONS: Therapeutic exercises, Therapeutic activity, Neuromuscular  re-education, Balance training, Gait training, Patient/Family education, Self Care, Joint mobilization, Stair training, Dry Needling, Electrical stimulation, Spinal mobilization, Cryotherapy, Moist heat, Manual therapy, and Re-evaluation.  PLAN FOR NEXT SESSION:  update HEP as appropriate, progressive core/LE/functional strengthening, consider repeated motions assessment, education. Manual therapy / dry needling as needed.   Luretha Murphy. [name], PT, DPT 02/20/23, 8:32 PM  [facility] [address] [place], [place] [address] P: [phone] I F: [phone]

## 2023-02-22 NOTE — Progress Notes (Unsigned)
Cardiology Office Note:    Date:  02/22/2023   ID:  Theresa Ortiz, DOB 01-27-1991, MRN [id number]  PCP:  Gaspar Garbe, MD  Cardiologist:  Reatha Harps, MD { Click to update primary MD,subspecialty MD or APP then REFRESH:1}    Referring MD: Gaspar Garbe, MD   Chief Complaint: follow-up of atypical chest pain and palpitations  History of Present Illness:    Theresa Ortiz is a 32 y.o. female with a history of atypical chest pain with negative ETT in 02/2019, palpitations with PACs (3.9% burden) on monitor in 05/2020, obesity, and prior tobacco abuse who is followed by Dr. Flora Lipps and presents today for routine follow-up.   Patient has a history of intermittent atypical chest pain and palpitations. Echo in 01/2019 showed  LVEF of 60-655 with normal wall motion and no significant valvular disease. ETT in 02/2019 was negative for ischemia. Monitor in 05/2020 showed predominant sinus rhythm with PACs (3.9% burden) and rare PVCs. There were 20 patient triggered events associated with sinus rhythm and sinus tachycardia as well as PACs.  She was last seen by Dr. Flora Lipps in 03/2021 at which time she was doing relatively well. She did report some chest pain over the Christmas holiday which was stress-induced. She also reported daily palpitations. However, she was not taking her Metoprolol regularly. She reported her symptoms improved when taking her Metoprolol. She was encouraged to take her Metoprolol regularly and was counseled on triggers such as caffeine.   Patient presents today for routine follow-up. ***  Atypical Chest Pain Patient has a long history of atypical chest pain. ETT in 02/2019 was negative for ischemia.  - ***  Palpitations PACs Patient has a long history of palpitations. Monitor in 05/2020 showed predominant sinus rhythm with PACs (3.9% burden) and rare PVCs. There were 20 patient triggered events associated with sinus rhythm and sinus tachycardia as well  as PACs. - Stable. *** - Continue Toprol-XL 25mg  daily.  Obesity ***  EKGs/Labs/Other Studies Reviewed:    The following studies were reviewed:  Echocardiogram 02/25/2019: Impressions: 1. Left ventricular ejection fraction, by visual estimation, is 60 to  65%. The left ventricle has normal function. There is no left ventricular  hypertrophy.   2. Global right ventricle has normal systolic function.The right  ventricular size is normal. No increase in right ventricular wall  thickness.   3. Left atrial size was normal.   4. Right atrial size was normal.   5. The mitral valve is normal in structure. Trace mitral valve  regurgitation. No evidence of mitral stenosis.   6. The tricuspid valve is normal in structure. Tricuspid valve  regurgitation is trivial.   7. The aortic valve is normal in structure. Aortic valve regurgitation is  not visualized.   8. The pulmonic valve was normal in structure. Pulmonic valve  regurgitation is not visualized.   9. The atrial septum is grossly normal.  _______________  Exercise Tolerance Test 03/16/2019: Blood pressure demonstrated a normal response to exercise. There was no ST segment deviation noted during stress. No T wave inversion was noted during stress. The patient experienced no angina during the stress test. Overall, the patient's exercise capacity was normal During recovery patient reported feeling very dizzy and slight chest tightness. All symptoms were resolved during the recovery period. Duke Treadmill Score: low risk   Negative stress test without evidence of ischemia at given workload. _______________  Monitor 06/16/2020 to 06/22/2020: Patient had a min HR of 55  bpm, max HR of 172 bpm, and avg HR of 87 bpm. Predominant underlying rhythm was Sinus Rhythm. Isolated SVEs were occasional (3.9%, [id number]), SVE Couplets were rare (<1.0%, 13), and SVE Triplets were rare (<1.0%, 3). Isolated VEs were rare (<1.0%), and no VE Couplets or VE  Triplets were present.   There were 20 patient triggered events associated with sinus rhythm and sinus tachycardia (69-117 bpm). PACs also were noted during the patient triggered events. There were 11 diary events recorded, but the time was not recorded and they were unable to be associated with the monitor results.    Impression: 1. Occasional PACs (3.9% burden). 2. Symptoms occurred with PACs.  3. No sustained arrhythmias.    EKG:  EKG ordered today. EKG personally reviewed and demonstrates ***.  Recent Labs: 10/10/2022: ALT 33  Recent Lipid Panel No results found for: "CHOL", "TRIG", "HDL", "CHOLHDL", "VLDL", "LDLCALC", "LDLDIRECT"  Physical Exam:    Vital Signs: There were no vitals taken for this visit.    Wt Readings from Last 3 Encounters:  06/22/21 250 lb (113.4 kg)  04/27/21 265 lb 6.4 oz (120.4 kg)  10/06/20 258 lb 9.6 oz (117.3 kg)     General: 32 y.o. female in no acute distress. HEENT: Normocephalic and atraumatic. Sclera clear.  Neck: Supple. No carotid bruits. No JVD. Heart: *** RRR. Distinct S1 and S2. No murmurs, gallops, or rubs.  Lungs: No increased work of breathing. Clear to ausculation bilaterally. No wheezes, rhonchi, or rales.  Abdomen: Soft, non-distended, and non-tender to palpation.  Extremities: No lower extremity edema.  Radial and distal pedal pulses 2+ and equal bilaterally. Skin: Warm and dry. Neuro: No focal deficits. Psych: Normal affect. Responds appropriately.   Assessment:    No diagnosis found.  Plan:     Disposition: Follow up in ***   Signed, Corrin Parker, PA-C  02/22/2023 2:54 PM    [facility]

## 2023-02-25 ENCOUNTER — Ambulatory Visit: Payer: 59 | Admitting: Student

## 2023-02-25 ENCOUNTER — Ambulatory Visit: Payer: 59 | Attending: Student | Admitting: Student

## 2023-02-25 ENCOUNTER — Encounter: Payer: Self-pay | Admitting: Student

## 2023-02-25 VITALS — BP 102/76 | HR 74 | Ht 67.0 in | Wt 260.0 lb

## 2023-02-25 DIAGNOSIS — R002 Palpitations: Secondary | ICD-10-CM

## 2023-02-25 DIAGNOSIS — R0789 Other chest pain: Secondary | ICD-10-CM

## 2023-02-25 DIAGNOSIS — R0683 Snoring: Secondary | ICD-10-CM | POA: Diagnosis not present

## 2023-02-25 DIAGNOSIS — R5383 Other fatigue: Secondary | ICD-10-CM

## 2023-02-25 NOTE — Patient Instructions (Signed)
Medication Instructions:  No Changes *If you need a refill on your cardiac medications before your next appointment, please call your pharmacy*   Lab Work: No Labs If you have labs (blood work) drawn today and your tests are completely normal, you will receive your results only by: MyChart Message (if you have MyChart) OR A paper copy in the mail If you have any lab test that is abnormal or we need to change your treatment, we will call you to review the results.   Testing/Procedures: WatchPAT?  Is a FDA cleared portable home sleep study test that uses a watch and 3 points of contact to monitor 7 different channels, including your heart rate, oxygen saturations, body position, snoring, and chest motion.  The study is easy to use from the comfort of your own home and accurately detect sleep apnea.  Before bed, you attach the chest sensor, attached the sleep apnea bracelet to your nondominant hand, and attach the finger probe.  After the study, the raw data is downloaded from the watch and scored for apnea events.   For more information: [email or web address]  Patient Testing Instructions:  Do not put battery into the device until bedtime when you are ready to begin the test. Please call the support number if you need assistance after following the instructions below: 24 hour support line- [phone] or [facility] support at [phone] (option 2)  Download the IntelWatchPAT One" app through the google play store or App Store  Be sure to turn on or enable access to bluetooth in settlings on your smartphone/ device  Make sure no other bluetooth devices are on and within the vicinity of your smartphone/ device and WatchPAT watch during testing.  Make sure to leave your smart phone/ device plugged in and charging all night.  When ready for bed:  Follow the instructions step by step in the WatchPAT One App to activate the testing device. For additional instructions, including  video instruction, visit the WatchPAT One video on Youtube. You can search for WatchPat One within Youtube (video is 4 minutes and 18 seconds) or enter: https://youtube/watch?[email or web address]_vbiwxE Please note: You will be prompted to enter a Pin to connect via bluetooth when starting the test. The PIN will be assigned to you when you receive the test.  The device is disposable, but it recommended that you retain the device until you receive a call letting you know the study has been received and the results have been interpreted.  We will let you know if the study did not transmit to [place] properly after the test is completed. You do not need to call us to confirm the receipt of the test.  Please complete the test within 48 hours of receiving PIN.   Frequently Asked Questions:  What is Watch Dennie Bible one?  A single use fully disposable home sleep apnea testing device and will not need to be returned after completion.  What are the requirements to use WatchPAT one?  The be able to have a successful watchpat one sleep study, you should have your Watch pat one device, your smart phone, watch pat one app, your PIN number and Internet access What type of phone do I need?  You should have a smart phone that uses Android 5.1 and above or any Iphone with IOS 10 and above How can I download the WatchPAT one app?  Based on your device type search for WatchPAT one app either in google play for android devices or APP store for Iphone's  Where will I get my PIN for the study?  Your PIN will be provided by your physician's office. It is used for authentication and if you lose/forget your PIN, please reach out to your providers office.  I do not have Internet at home. Can I do WatchPAT one study?  WatchPAT One needs Internet connection throughout the night to be able to transmit the sleep data. You can use your home/local internet or your cellular's data package. However, it is always recommended to use home/local Internet. It is  estimated that between 20MB-30MB will be used with each study.However, the application will be looking for space in the phone to start the study.  What happens if I lose internet or bluetooth connection?  During the internet disconnection, your phone will not be able to transmit the sleep data. All the data, will be stored in your phone. As soon as the internet connection is back on, the phone will being sending the sleep data. During the bluetooth disconnection, WatchPAT one will not be able to to send the sleep data to your phone. Data will be kept in the [facility] one until two devices have bluetooth connection back on. As soon as the connection is back on, WatchPAT one will send the sleep data to the phone.  How long do I need to wear the WatchPAT one?  After you start the study, you should wear the device at least 6 hours.  How far should I keep my phone from the device?  During the night, your phone should be within 15 feet.  What happens if I leave the room for restroom or other reasons?  Leaving the room for any reason will not cause any problem. As soon as your get back to the room, both devices will reconnect and will continue to send the sleep data. Can I use my phone during the sleep study?  Yes, you can use your phone as usual during the study. But it is recommended to put your watchpat one on when you are ready to go to bed.  How will I get my study results?  A soon as you completed your study, your sleep data will be sent to the provider. They will then share the results with you when they are ready.      Follow-Up: At [facility], you and your health needs are our priority.  As part of our continuing mission to provide you with exceptional heart care, we have created designated Provider Care Teams.  These Care Teams include your primary Cardiologist (physician) and Advanced Practice Providers (APPs -  Physician Assistants and Nurse Practitioners) who all work together  to provide you with the care you need, when you need it.  We recommend signing up for the patient portal called "MyChart".  Sign up information is provided on this After Visit Summary.  MyChart is used to connect with patients for Virtual Visits (Telemedicine).  Patients are able to view lab/test results, encounter notes, upcoming appointments, etc.  Non-urgent messages can be sent to your provider as well.   To learn more about what you can do with MyChart, go to [email or web address].    Your next appointment:   1 year(s)  Provider:   Reatha Harps, MD

## 2023-02-26 ENCOUNTER — Ambulatory Visit: Payer: 59 | Admitting: Physical Therapy

## 2023-02-26 ENCOUNTER — Encounter: Payer: Self-pay | Admitting: Physical Therapy

## 2023-02-26 DIAGNOSIS — M792 Neuralgia and neuritis, unspecified: Secondary | ICD-10-CM | POA: Diagnosis not present

## 2023-02-26 DIAGNOSIS — M62838 Other muscle spasm: Secondary | ICD-10-CM

## 2023-02-26 DIAGNOSIS — M5459 Other low back pain: Secondary | ICD-10-CM | POA: Diagnosis not present

## 2023-02-26 NOTE — Therapy (Signed)
OUTPATIENT PHYSICAL THERAPY TREATMENT  Patient Name: CODY SHAVE MRN: [id number] DOB:[birth year], 32 y.o., female Today's Date: 02/26/2023  END OF SESSION:    PT End of Session - 02/26/23 2032     Visit Number 25    Number of Visits 30    Date for PT Re-Evaluation 04/14/23    Authorization Type [place] [employer] F[employer] reporting period from 12/02/2022    Authorization - Visit Number 25    Progress Note Due on Visit 30    PT Start Time 1650    PT Stop Time 1730    PT Time Calculation (min) 40 min    Activity Tolerance Patient tolerated treatment well    Behavior During Therapy [facility] for tasks assessed/performed             Past Medical History:  Diagnosis Date   Acute meniscal tear of knee    left knee   Allergy    Anxiety    Asthma    Atrial fibrillation (HCC)    slight   COVID-19 04/2020   Depression    Ovarian cyst    PVC (premature ventricular contraction)    Scoliosis    Shingles    Tachycardia    Past Surgical History:  Procedure Laterality Date   KNEE ARTHROSCOPY WITH LATERAL MENISECTOMY Left 09/01/2012   Procedure: LEFT KNEE ARTHROSCOPY WITH LATERAL MENISCECTOMY ;  Surgeon: Jacki Cones, MD;  Location: [facility] [place];  Service: Orthopedics;  Laterality: Left;   MENISCUS REPAIR Right    RIGHT INDEX  FINGER TENDON REPAIR  NOV 2013   Patient Active Problem List   Diagnosis Date Noted   Palpitation 07/13/2018   Atypical chest pain 07/13/2018   Family history of heart disease 07/13/2018   Shortness of breath 07/13/2018   Sinus tachycardia 07/13/2018   Acute lateral meniscus tear of left knee 09/01/2012    PCP: Tisovec, Adelfa Koh, MD  REFERRING PROVIDER: Filomena Jungling, MD  REFERRING DIAG: 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 Pt reports she has some pain at the mid calf that is the size of packing tape that feels like a cramp. She reports she has tension at L low back and glute. LBP/glute pain aggravated by lifting/moving patients, and bending forward. Has had L sided sciatic symptoms 1x/week when lifting something heavy down post LLE. She hs previously had pain with prolonged walking, but that has subsided over the past year or so. Has had successful PT in the past with [facility], which she thinks is very helpful. Pt works full time as an Engineer, structural, enjoys traveling and walking her dog. Comorbid conditions as listed above. No falls in past 6 months.  denies N/V, B&B changes, unexplained weight fluctuation, saddle paresthesia, fever, night sweats, or unrelenting night pain at this time. Patient denies hx of cancer, stroke, seizures, diabetes, unexplained weight loss, unexplained changes in bowel or bladder problems, unexplained stumbling or dropping things, osteoporosis, and spinal surgery  SUBJECTIVE:  SUBJECTIVE STATEMENT:   Patient reports the spot on her left hip has been really sore when she sits with more pressure on the left buttocks. She has also been really sore after getting her flu shot yesterday.  She felt sore after last PT session. She had back pain in the left that crept to the right side of her back for about 1 minute after last PT session, but it went away. She states she did well with her HEP over the weekend but not as much on Monday.   PAIN:  NPRS: 1/10 left buttocks and slight intermittent disturbance in medial lower left leg (last noticed this Monday with heaviness in her hip  when working into work up the hill).   PATIENT GOALS: Get the cramp out of my calf, to "eleviate whatever pissed it off this time out of the leg"   OBJECTIVE  TODAY'S TREATMENT:   Therapeutic exercise: to centralize symptoms and improve ROM, strength, muscular endurance, and activity tolerance required for successful completion of functional activities.  - NuStep level 6 using bilateral upper and lower extremities. Seat/handle setting 9/9. For improved extremity mobility, muscular endurance, and activity tolerance; and to induce the analgesic effect of aerobic exercise, stimulate improved joint nutrition, and prepare body structures and systems for following interventions. x 5 minutes. Average SPM = 83.  (Manual therapy / dry needling - see below) - Quadruped bird dog (alternating shoulder flexion/contralateral hip extension with core muscles braced) , with cone over sacrum for biofeedback to help keep core stabilized. 2x10 each side. - quadruped bird dog repeaters with (contralateral elbow/knee taps to reach), 1x10 each side with cone over sacrum to provided feedback.  - kickstand deadlift with KB in contralateral UE, 2x10 each side with 10#KB, 1x10 each side with 20#KB. Patient reports pulling in left calf and later tightness in left glute following exercise.  Manual therapy: to reduce pain and tissue tension, improve range of motion, neuromodulation, in order to promote improved ability to complete functional activities. PRONE - STM to left posterior hip/glutes/deep rotators  Modality: Dry needling performed to B lumbar paraspinals and left posterior hip region to decrease pain and spasms along patient's glute and leg region with patient in prone utilizing 2 dry needle(s) .30mm x with 3 sticks at upper glutes just lateral to L SIJ,  and 1 sticks at left piriformis region. Patient educated about the risks and benefits from therapy and verbally consents to treatment.  Dry needling  performed by Luretha Murphy. [name] PT, DPT who is certified in this technique.  Pt required multimodal cuing for proper technique and to facilitate improved neuromuscular control, strength, range of motion, and functional ability resulting in improved performance and form.  PATIENT EDUCATION:  Education details: Exercise purpose/form. Self management techniques. Education on diagnosis, prognosis, POC, anatomy and physiology of current condition. Education on dry needling.  Person educated: Patient Education method: Medical illustrator Education comprehension: verbalized understanding and returned demonstration  HOME EXERCISE PROGRAM: Access Code: [email or web address] URL: [email or web address] Date: 02/12/2023 Prepared by: Norton Blizzard  Exercises - Seated Pelvic Floor Elevators  - 1 x daily - 2 sets - 10 reps - Seated Quick Flick Pelvic Floor Contractions  - 1 x daily - 2 sets - 10 reps - Seated Pelvic Floor Contraction  - 1 x daily - 2 sets - 10 reps - Supine Sciatic Nerve Glide  - 1-2 x daily - 7 x weekly - 10 reps - Bird Dog  - 3 x weekly -  2 sets - 10 reps - 5 second hold - Dead Bug  - 3 x weekly - 3 sets - 5-10 reps - Barbell Thruster  - 3 x weekly - 3 sets - 10 reps - Forearm Side Plank on Counter  - 3 x weekly - 3 sets - 30 seconds hold - Standard Plank  - 3 x weekly - 5 sets - 15 seconds hold - Seated Piriformis Stretch with Trunk Bend  - 3 x weekly - 3-5 reps - 10-30 seconds hold - Standing Anti-Rotation Press with Anchored Resistance  - 3 x weekly - 3 sets - 10 reps - 5 seconds hold  HOME EXERCISE PROGRAM [[email or web address]] View at "[email or web address]" using code: [id number] SELF TRACTION - LEVEL 3 - H16 -   ASSESSMENT:  CLINICAL IMPRESSION:   Patient arrives with decreased symptoms overall since last PT session. She continues to have left glute pain/tightness that responds well in the past to dry needling, so it was repeated today. She demonstrated improved endurance and  control with bird dog exercise, so it was progressed. Patient also progressed to a standing hip hinge kickstand deadlift pattern with fairly good tolerance and improved form with cuing. She was able to maintain a fairly neutral spine in the sagittal plane but needed cuing for improved hip hinge, equal weight bearing on forward foot, and to decrease spinal rotation during exercise. Patient continues to be limited in function and quality of life by pain and strength/endurance deficits. Patient would benefit from continued management of limiting condition by skilled physical therapist to address remaining impairments and functional limitations to work towards stated goals and return to PLOF or maximal functional independence.   From Re-Evaluation 11/19/2022:  Patient is a 32 y.o. 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 Patient is returning to PT more than 90 days since last PT visit to continue care for the same condition as before. She completed 5 physical therapy sessions with improvement in symptoms before her attendance lapsed. She now presents with worsening left LE symptoms and low back and left glute pain since her work environment changed. She had good response to physical therapy intervention in the past and good potential for improvement again with consistent attendance. Patient did demonstrate improvement in trunk endurance at today's goal assessment. Patient presents with significant pain, paresthesia, muscle tension, motor control, muscle performance (strength/power/endurance), and activity tolerance impairments that are limiting ability to complete her usual activities such as working, walking, prolonged standing, bending, lifting, yardwork, housework, sleeping,  traveling, prolonged sitting, going to the beach, anything that requires repetitive bending or lifting without difficulty. Patient will benefit from skilled physical therapy intervention to address current body structure impairments and activity limitations to improve function and work towards goals set in current POC in order to return to prior level of function or maximal functional improvement.   OBJECTIVE IMPAIRMENTS: Abnormal gait, decreased activity tolerance, decreased balance, decreased endurance, decreased knowledge of condition, decreased mobility, difficulty walking, decreased ROM, decreased strength, increased fascial restrictions, impaired perceived functional ability, increased muscle spasms, impaired flexibility, improper body mechanics, postural dysfunction, obesity, and pain.   ACTIVITY LIMITATIONS: carrying, lifting, bending, sitting, standing, squatting, stairs, transfers, hygiene/grooming, locomotion level, and caring for others  PARTICIPATION LIMITATIONS: meal prep, cleaning, laundry, driving, shopping, community activity, occupation, yard work, and  working, walking, prolonged standing, bending, lifting, yardwork, housework, sleeping, traveling, prolonged sitting, going to the beach, anything that requires repetitive bending or lifting   PERSONAL FACTORS: Fitness, Past/current experiences, Time since onset of injury/illness/exacerbation, and 1-2 comorbidities: lateral meniscus tear of left knee; Palpitation; Atypical chest pain; Family history of heart disease; Shortness of breath; and Sinus tachycardia on their problem list. past medical history of Acute meniscal tear of knee, Allergy, Anxiety, Asthma, Atrial fibrillation (HCC), COVID-19 (04/2020), Depression, Ovarian cyst, PVC (premature ventricular contraction), Scoliosis, Shingles, and Tachycardia. past surgical history that includes RIGHT INDEX  FINGER TENDON REPAIR (NOV 2013); Knee arthroscopy with lateral menisectomy (Left,  09/01/2012); and Meniscus repair (Right)  are also affecting patient's functional outcome.   REHAB POTENTIAL: Good  CLINICAL DECISION MAKING: Evolving/moderate complexity  EVALUATION COMPLEXITY: Moderate   GOALS: Goals reviewed with patient? Yes  SHORT TERM GOALS: Target date: 08/07/22. Target date updated to 12/04/2022 on 11/19/2022.   Pt will be independent with initial HEP in order to improve strength and balance in order to decrease fall risk and improve function at home and work. Baseline:HEP given; patient not participating (11/19/2022); participating regularly (12/02/2022);  Goal status: MET   LONG TERM GOALS: Target date: 08/26/22. Target date updated to 02/11/2023 for all unmet goals on 11/19/2022. Target date updated to 04/14/2023 for all unmet goals on 01/20/2023.   Patient will demonstrate improved FOTO by equal or greater than 10 by visit #17 to demonstrate improvement in overall condition and self-reported functional ability.  Baseline: initial baseline 55 (07/01/2022); 60 at visit #17 (12/26/2022);  Goal status: in-progress  2.  Pt will decrease worst pain as reported on NPRS by at least 3 points in order to demonstrate clinically significant reduction in pain. Baseline: 4/10 (07/01/2022): up to 10/10  (11/19/2022); up to 4-5/10 when burning two days ago, 2.5/10 when excluding the burning (12/06/2022); 3-4/10 in the last 2 weeks (01/20/2023);  Goal status: on-going  3.  Pt will demonstrate plank time of or more to demonstrate age-predicted match of core strength needed for job duties Baseline: 10sec (07/01/2022): 24 seconds (11/19/2022); 23 seconds (12/26/2022); 37 seconds (01/20/2023);  Goal status: In-progress  4.  Pt will demonstrate bering sorenson test of 34sec or more to demonstrate clinically significant increase in spinal extensor strength needed for job duties Baseline: 20sec (07/01/2022); 58 seconds (11/19/2022);  Goal status: MET  5.  Patient will be independent with initial  home exercise program for self-management of symptoms. Baseline: HEP to be reviewed and updated as needed at visit 7 as appropriate (11/19/22); she has has been participating 2x a week plus PT sessions + what  is left over after PT session (12/26/2022);  Goal status: MET    PLAN:  PT FREQUENCY: 1-2x/week  PT DURATION: 12 weeks  PLANNED INTERVENTIONS: Therapeutic exercises, Therapeutic activity, Neuromuscular re-education, Balance training, Gait training, Patient/Family education, Self Care, Joint mobilization, Stair training, Dry Needling, Electrical stimulation, Spinal mobilization, Cryotherapy, Moist heat, Manual therapy, and Re-evaluation.  PLAN FOR NEXT SESSION:  update HEP as appropriate, progressive core/LE/functional strengthening, consider repeated motions assessment, education. Manual therapy / dry needling as needed.   Consider HEP update, weaning from dry needling, progressing core and functional strengthening. Consider using weight in both hand for the kickstand dead lift to even out upper back rotation.   Luretha Murphy. [name], PT, DPT 02/26/23, 8:44 PM  [facility] [address] [place], [place] [address] P: [phone] I F: [phone]

## 2023-03-03 ENCOUNTER — Telehealth: Payer: Self-pay | Admitting: Cardiovascular Disease

## 2023-03-03 ENCOUNTER — Encounter: Payer: Self-pay | Admitting: Cardiovascular Disease

## 2023-03-03 DIAGNOSIS — L821 Other seborrheic keratosis: Secondary | ICD-10-CM | POA: Diagnosis not present

## 2023-03-03 DIAGNOSIS — L578 Other skin changes due to chronic exposure to nonionizing radiation: Secondary | ICD-10-CM | POA: Diagnosis not present

## 2023-03-03 DIAGNOSIS — D229 Melanocytic nevi, unspecified: Secondary | ICD-10-CM | POA: Diagnosis not present

## 2023-03-03 DIAGNOSIS — L814 Other melanin hyperpigmentation: Secondary | ICD-10-CM | POA: Diagnosis not present

## 2023-03-03 NOTE — Telephone Encounter (Signed)
Looks like she also sent a MyChart message this morning and described the pain as a "constant pulsating zings to the mid/ right side of chest" which sounds atypical.  Agree with all your recommendations. Recommend she take her Metoprolol as prescribed. If symptoms continue. she should let us know.  Thanks! Theresa Ortiz

## 2023-03-03 NOTE — Telephone Encounter (Signed)
Patient c/o Palpitations:  STAT if patient reporting lightheadedness, shortness of breath, or chest pain  How long have you had palpitations/irregular HR/ Afib? Are you having the symptoms now? This weekend; not now but happened today  Are you currently experiencing lightheadedness, SOB or CP? SOB  Do you have a history of afib (atrial fibrillation) or irregular heart rhythm? Yes   Have you checked your BP or HR? (document readings if available): no   Are you experiencing any other symptoms?  constant pulsating " zings" of pain to the mid/right side of chest

## 2023-03-03 NOTE — Telephone Encounter (Signed)
Spoke with patient and she states she has had palpitations over the weekend but she is not taking her metoprolol has prescribed. She states she is having sternum/chest pain. Did advise if she is having active chest pain she will need to go to the ED. She states she does not want to go to the ED. She currently not having palpitations. She can not give me any vitals because she is driving to her dermatology appointment.   Advised to take metoprolol everyday as prescribed because this can help with her palpitations.  Decreasing/avoiding caffeine intake. ED precautions discussed. Will forward to provider.

## 2023-03-04 ENCOUNTER — Encounter: Payer: Self-pay | Admitting: Physical Therapy

## 2023-03-04 ENCOUNTER — Encounter: Payer: 59 | Admitting: Physical Therapy

## 2023-03-04 ENCOUNTER — Ambulatory Visit: Payer: 59 | Attending: Physical Medicine & Rehabilitation | Admitting: Physical Therapy

## 2023-03-04 DIAGNOSIS — M792 Neuralgia and neuritis, unspecified: Secondary | ICD-10-CM | POA: Diagnosis not present

## 2023-03-04 DIAGNOSIS — M5459 Other low back pain: Secondary | ICD-10-CM | POA: Insufficient documentation

## 2023-03-04 DIAGNOSIS — M62838 Other muscle spasm: Secondary | ICD-10-CM | POA: Diagnosis not present

## 2023-03-04 NOTE — Therapy (Signed)
OUTPATIENT PHYSICAL THERAPY TREATMENT  Patient Name: Theresa Ortiz MRN: [id number] DOB:December 16, 1990, 32 y.o., female Today's Date: 03/04/2023  END OF SESSION:    PT End of Session - 03/04/23 1616     Visit Number 56    Number of Visits 30    Date for PT Re-Evaluation 04/14/23    Authorization Type  [employer] FOCUS reporting period from 12/02/2022    Progress Note Due on Visit 30    PT Start Time 1610    PT Stop Time 1648    PT Time Calculation (min) 38 min    Activity Tolerance Patient tolerated treatment well    Behavior During Therapy [facility] for tasks assessed/performed              Past Medical History:  Diagnosis Date   Acute meniscal tear of knee    left knee   Allergy    Anxiety    Asthma    Atrial fibrillation (HCC)    slight   COVID-19 04/2020   Depression    Ovarian cyst    PVC (premature ventricular contraction)    Scoliosis    Shingles    Tachycardia    Past Surgical History:  Procedure Laterality Date   KNEE ARTHROSCOPY WITH LATERAL MENISECTOMY Left 09/01/2012   Procedure: LEFT KNEE ARTHROSCOPY WITH LATERAL MENISCECTOMY ;  Surgeon: Jacki Cones, MD;  Location: [facility] [place];  Service: Orthopedics;  Laterality: Left;   MENISCUS REPAIR Right    RIGHT INDEX  FINGER TENDON REPAIR  NOV 2013   Patient Active Problem List   Diagnosis Date Noted   Palpitation 07/13/2018   Atypical chest pain 07/13/2018   Family history of heart disease 07/13/2018   Shortness of breath 07/13/2018   Sinus tachycardia 07/13/2018   Acute lateral meniscus tear of left knee 09/01/2012    PCP: Tisovec, Adelfa Koh, MD  REFERRING PROVIDER: Filomena Jungling, MD  REFERRING DIAG: 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 Pt reports she has some pain at the mid calf that is the size of packing tape that feels like a cramp. She reports she has tension at L low back and glute. LBP/glute pain aggravated by lifting/moving patients, and bending forward. Has had L sided sciatic symptoms 1x/week when lifting something heavy down post LLE. She hs previously had pain with prolonged walking, but that has subsided over the past year or so. Has had successful PT in the past with [facility], which she thinks is very helpful. Pt works full time as an Engineer, structural, enjoys traveling and walking her dog. Comorbid conditions as listed above. No falls in past 6 months.  denies N/V, B&B changes, unexplained weight fluctuation, saddle paresthesia, fever, night sweats, or unrelenting night pain at this time. Patient denies hx of cancer, stroke, seizures, diabetes, unexplained weight loss, unexplained changes in bowel or bladder problems, unexplained stumbling or dropping things, osteoporosis, and spinal surgery  SUBJECTIVE:  SUBJECTIVE STATEMENT:   Patient reports her left calf has been bothering her on and off (last was last evening) and she is a little sore and tender in her left glute and near left SIJ. She states she had to help restrain a patient for 1.5 hours at work just before arrival, and she is really sweaty. She states she was sore after last PT session, soreness with cramping pain in the left glute. She states she has not been having chest pain or palpitations today.   PAIN:  NPRS: 1.5/10 left buttocks and slight intermittent disturbance in medial lower left leg  PATIENT GOALS: Get the cramp out of my calf, to "eleviate whatever pissed it off this  time out of the leg"   OBJECTIVE  TODAY'S TREATMENT:   Therapeutic exercise: to centralize symptoms and improve ROM, strength, muscular endurance, and activity tolerance required for successful completion of functional activities.  - quadruped bird dog repeaters with (contralateral elbow/knee taps to reach), 3x10 each side with cone over sacrum to provided feedback.  - kickstand deadlift with KB in contralateral UE, 1x10 each side with 10#KB, 2x10 each side with 20#KB. Patient with some discomfort in right QL region with two sets in a row, improved form and tolerance with addition of dynadisc under left toe on lat set of right side.  - sidelying reverse clamshell, 1x10 AROM each side, 3x10 each side with G/R/RTB (black to heavy, green too heavy to have full AROM when fatigued).  Increased tightness and pulling down left leg by end of sets (gradually resolving before she left).  - Education on HEP including handout   Pt required multimodal cuing for proper technique and to facilitate improved neuromuscular control, strength, range of motion, and functional ability resulting in improved performance and form.  PATIENT EDUCATION:  Education details: Exercise purpose/form. Self management techniques. Education on diagnosis, prognosis, POC, anatomy and physiology of current condition. Education on dry needling.  Person educated: Patient Education method: Medical illustrator Education comprehension: verbalized understanding and returned demonstration  HOME EXERCISE PROGRAM: Access Code: [email or web address] URL: [email or web address] Date: 02/12/2023 Prepared by: Norton Blizzard  Exercises - Seated Pelvic Floor Elevators  - 1 x daily - 2 sets - 10 reps - Seated Quick Flick Pelvic Floor Contractions  - 1 x daily - 2 sets - 10 reps - Seated Pelvic Floor Contraction  - 1 x daily - 2 sets - 10 reps - Supine Sciatic Nerve Glide  - 1-2 x daily - 7 x weekly - 10 reps - Bird Dog  - 3 x weekly  - 2 sets - 10 reps - 5 second hold - Dead Bug  - 3 x weekly - 3 sets - 5-10 reps - Barbell Thruster  - 3 x weekly - 3 sets - 10 reps - Forearm Side Plank on Counter  - 3 x weekly - 3 sets - 30 seconds hold - Standard Plank  - 3 x weekly - 5 sets - 15 seconds hold - Seated Piriformis Stretch with Trunk Bend  - 3 x weekly - 3-5 reps - 10-30 seconds hold - Standing Anti-Rotation Press with Anchored Resistance  - 3 x weekly - 3 sets - 10 reps - 5 seconds hold  HOME EXERCISE PROGRAM [[email or web address]] View at [email or web address]" using code: [email or web address] KICKSTAND DEADLIFT -  Repeat 10 Repetitions, Complete 3 Sets, Perform 3 Times a Week  ASSESSMENT:  CLINICAL IMPRESSION:   Patient arrives with continued discomfort but not excessively irritated. Today's session focused on progressing  core and functional strengthening and deep hip rotator strengthening. Patient tolerated bird dog and kickstand dead lift with fatigue but good overall tolerance. She had some pain at her right low back with right sided deadlift, but this was improved by elevating left foot on a dynadisk. HEP updated to include this exercise. She found the reverse clams irritating to her left leg pain. Plan to continue with progressive strengthening next session as tolerated. Patient would benefit from continued management of limiting condition by skilled physical therapist to address remaining impairments and functional limitations to work towards stated goals and return to PLOF or maximal functional independence.   From Re-Evaluation 11/19/2022:  Patient is a 32 y.o. 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 Patient is returning to PT more than 90 days since last PT visit to continue care for the same condition as  before. She completed 5 physical therapy sessions with improvement in symptoms before her attendance lapsed. She now presents with worsening left LE symptoms and low back and left glute pain since her work environment changed. She had good response to physical therapy intervention in the past and good potential for improvement again with consistent attendance. Patient did demonstrate improvement in trunk endurance at today's goal assessment. Patient presents with significant pain, paresthesia, muscle tension, motor control, muscle performance (strength/power/endurance), and activity tolerance impairments that are limiting ability to complete her usual activities such as working, walking, prolonged standing, bending, lifting, yardwork, housework, sleeping, traveling, prolonged sitting, going to the beach, anything that requires repetitive bending or lifting without difficulty. Patient will benefit from skilled physical therapy intervention to address current body structure impairments and activity limitations to improve function and work towards goals set in current POC in order to return to prior level of function or maximal functional improvement.   OBJECTIVE IMPAIRMENTS: Abnormal gait, decreased activity tolerance, decreased balance, decreased endurance, decreased knowledge of condition, decreased mobility, difficulty walking, decreased ROM, decreased strength, increased fascial restrictions, impaired perceived functional ability, increased muscle spasms, impaired flexibility, improper body mechanics, postural dysfunction, obesity, and pain.   ACTIVITY LIMITATIONS: carrying, lifting, bending, sitting, standing, squatting, stairs, transfers, hygiene/grooming, locomotion level, and caring for others  PARTICIPATION LIMITATIONS: meal prep, cleaning, laundry, driving, shopping, community activity, occupation, yard work, and   working, walking, prolonged standing, bending, lifting, yardwork, housework, sleeping,  traveling, prolonged sitting, going to [employer], anything that requires repetitive bending or lifting   PERSONAL FACTORS: Fitness, Past/current experiences, Time since onset of injury/illness/exacerbation, and 1-2 comorbidities: lateral meniscus tear of left knee; Palpitation; Atypical chest pain; Family history of heart disease; Shortness of breath; and Sinus tachycardia on their problem list. past medical history of Acute meniscal tear of knee, Allergy, Anxiety, Asthma, Atrial fibrillation (HCC), COVID-19 (04/2020), Depression, Ovarian cyst, PVC (premature ventricular contraction), Scoliosis, Shingles, and Tachycardia. past surgical history that includes RIGHT INDEX  FINGER TENDON REPAIR (NOV 2013); Knee arthroscopy with lateral menisectomy (Left, 09/01/2012); and Meniscus repair (Right)  are also affecting patient's functional outcome.   REHAB POTENTIAL: Good  CLINICAL DECISION MAKING: Evolving/moderate complexity  EVALUATION COMPLEXITY: Moderate   GOALS: Goals reviewed with patient? Yes  SHORT TERM GOALS: Target date: 08/07/22. Target date updated to 12/04/2022 on 11/19/2022.   Pt will be independent with initial HEP in order to improve strength and balance in order  to decrease fall risk and improve function at home and work. Baseline:HEP given; patient not participating (11/19/2022); participating regularly (12/02/2022);  Goal status: MET   LONG TERM GOALS: Target date: 08/26/22. Target date updated to 02/11/2023 for all unmet goals on 11/19/2022. Target date updated to 04/14/2023 for all unmet goals on 01/20/2023.   Patient will demonstrate improved FOTO by equal or greater than 10 by visit #17 to demonstrate improvement in overall condition and self-reported functional ability.  Baseline: initial baseline 55 (07/01/2022); 60 at visit #17 (12/26/2022);  Goal status: in-progress  2.  Pt will decrease worst pain as reported on NPRS by at least 3 points in order to demonstrate clinically significant  reduction in pain. Baseline: 4/10 (07/01/2022): up to 10/10  (11/19/2022); up to 4-5/10 when burning two days ago, 2.5/10 when excluding the burning (12/06/2022); 3-4/10 in the last 2 weeks (01/20/2023);  Goal status: on-going  3.  Pt will demonstrate plank time of or more to demonstrate age-predicted match of core strength needed for job duties Baseline: 10sec (07/01/2022): 24 seconds (11/19/2022); 23 seconds (12/26/2022); 37 seconds (01/20/2023);  Goal status: In-progress  4.  Pt will demonstrate bering sorenson test of 34sec or more to demonstrate clinically significant increase in spinal extensor strength needed for job duties Baseline: 20sec (07/01/2022); 58 seconds (11/19/2022);  Goal status: MET  5.  Patient will be independent with initial home exercise program for self-management of symptoms. Baseline: HEP to be reviewed and updated as needed at visit 7 as appropriate (11/19/22); she has has been participating 2x a week plus PT sessions + what  is left over after PT session (12/26/2022);  Goal status: MET    PLAN:  PT FREQUENCY: 1-2x/week  PT DURATION: 12 weeks  PLANNED INTERVENTIONS: Therapeutic exercises, Therapeutic activity, Neuromuscular re-education, Balance training, Gait training, Patient/Family education, Self Care, Joint mobilization, Stair training, Dry Needling, Electrical stimulation, Spinal mobilization, Cryotherapy, Moist heat, Manual therapy, and Re-evaluation.  PLAN FOR NEXT SESSION:  update HEP as appropriate, progressive core/LE/functional strengthening, consider repeated motions assessment, education. Manual therapy / dry needling as needed.   Consider HEP update, weaning from dry needling, progressing core and functional strengthening. Consider using weight in both hand for the kickstand dead lift to even out upper back rotation.   Luretha Murphy. [name], PT, DPT 03/04/23, 5:11 PM  [facility] [address] [place], [place]  [address] P: [phone] I F: [phone]

## 2023-03-05 ENCOUNTER — Other Ambulatory Visit: Payer: Self-pay

## 2023-03-06 ENCOUNTER — Encounter: Payer: 59 | Admitting: Physical Therapy

## 2023-03-11 ENCOUNTER — Ambulatory Visit: Payer: 59 | Admitting: Physical Therapy

## 2023-03-13 ENCOUNTER — Ambulatory Visit: Payer: 59 | Admitting: Physical Therapy

## 2023-03-13 ENCOUNTER — Encounter: Payer: Self-pay | Admitting: Physical Therapy

## 2023-03-13 DIAGNOSIS — M62838 Other muscle spasm: Secondary | ICD-10-CM | POA: Diagnosis not present

## 2023-03-13 DIAGNOSIS — M792 Neuralgia and neuritis, unspecified: Secondary | ICD-10-CM | POA: Diagnosis not present

## 2023-03-13 DIAGNOSIS — M5459 Other low back pain: Secondary | ICD-10-CM

## 2023-03-13 NOTE — Therapy (Addendum)
OUTPATIENT PHYSICAL THERAPY TREATMENT  Patient Name: Theresa Ortiz MRN: [id number] DOB:1991-04-08, 32 y.o., female Today's Date: 03/13/2023  END OF SESSION:    PT End of Session - 03/13/23 1705     Visit Number 27    Number of Visits 30    Date for PT Re-Evaluation 04/14/23    Authorization Type [place] [employer] F[employer] reporting period from 12/02/2022    Progress Note Due on Visit 30    PT Start Time 1652    PT Stop Time 1730    PT Time Calculation (min) 38 min    Activity Tolerance Patient tolerated treatment well    Behavior During Therapy [facility] for tasks assessed/performed               Past Medical History:  Diagnosis Date   Acute meniscal tear of knee    left knee   Allergy    Anxiety    Asthma    Atrial fibrillation (HCC)    slight   COVID-19 04/2020   Depression    Ovarian cyst    PVC (premature ventricular contraction)    Scoliosis    Shingles    Tachycardia    Past Surgical History:  Procedure Laterality Date   KNEE ARTHROSCOPY WITH LATERAL MENISECTOMY Left 09/01/2012   Procedure: LEFT KNEE ARTHROSCOPY WITH LATERAL MENISCECTOMY ;  Surgeon: Jacki Cones, MD;  Location: [facility] [place];  Service: Orthopedics;  Laterality: Left;   MENISCUS REPAIR Right    RIGHT INDEX  FINGER TENDON REPAIR  NOV 2013   Patient Active Problem List   Diagnosis Date Noted   Palpitation 07/13/2018   Atypical chest pain 07/13/2018   Family history of heart disease 07/13/2018   Shortness of breath 07/13/2018   Sinus tachycardia 07/13/2018   Acute lateral meniscus tear of left knee 09/01/2012    PCP: Tisovec, Adelfa Koh, MD  REFERRING PROVIDER: Filomena Jungling, MD  REFERRING DIAG: 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 Pt reports she has some pain at the mid calf that is the size of packing tape that feels like a cramp. She reports she has tension at L low back and glute. LBP/glute pain aggravated by lifting/moving patients, and bending forward. Has had L sided sciatic symptoms 1x/week when lifting something heavy down post LLE. She hs previously had pain with prolonged walking, but that has subsided over the past year or so. Has had successful PT in the past with [facility], which she thinks is very helpful. Pt works full time as an Engineer, structural, enjoys traveling and walking her dog. Comorbid conditions as listed above. No falls in past 6 months.  denies N/V, B&B changes, unexplained weight fluctuation, saddle paresthesia, fever, night sweats, or unrelenting night pain at this time. 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 She feels most of the pain around her left glute and SIJ.    PAIN:  NPRS: 1/10 left buttocks and slight intermittent disturbance in medial lower left leg  PATIENT GOALS: Get the cramp out of my calf, to "eleviate whatever pissed it off this time out of the leg"   OBJECTIVE  TODAY'S TREATMENT:   Therapeutic exercise: to centralize symptoms and improve ROM, strength, muscular endurance, and activity tolerance required for successful completion of functional activities.   - NuStep  level 6 using bilateral upper and lower extremities. Seat/handle setting 9/9. For improved extremity mobility, muscular endurance, and activity tolerance; and to induce the analgesic effect of aerobic exercise, stimulate improved joint nutrition, and prepare body structures and systems for following interventions. x 8  minutes. Average SPM = 75.  - quadruped bird dog repeaters with (contralateral elbow/knee taps to reach), 3x10 each side with cone over sacrum to provided feedback.   Kickstand RDL with foam roller on outside leg 2x10 (attempted with 10 lbs KB but held due to back pulling)  Lateral lunges 2x10  Sidelying reverse clamshells red band: 2x10  Pt required multimodal cuing for proper technique and to facilitate improved neuromuscular control, strength, range of motion, and functional ability resulting in improved performance and form.  PATIENT EDUCATION:  Education details: Exercise purpose/form. Self management techniques. Education on diagnosis, prognosis, POC, anatomy and physiology of current condition. Education on dry needling.  Person educated: Patient Education method: Medical illustrator Education comprehension: verbalized understanding and returned demonstration  HOME EXERCISE PROGRAM: Access Code: [email or web address] URL: [email or web address] Date: 02/12/2023 Prepared by: Norton Blizzard  Exercises - Seated Pelvic Floor Elevators  - 1 x daily - 2 sets - 10 reps - Seated Quick Flick Pelvic Floor Contractions  - 1 x daily - 2 sets - 10 reps - Seated Pelvic Floor Contraction  - 1 x daily - 2 sets - 10 reps - Supine Sciatic Nerve Glide  - 1-2 x daily - 7 x weekly - 10 reps - Bird Dog  - 3 x weekly - 2 sets - 10 reps - 5 second hold - Dead Bug  - 3 x weekly - 3 sets - 5-10 reps - Barbell Thruster  - 3 x weekly - 3 sets - 10 reps - Forearm Side Plank on Counter  - 3 x weekly - 3 sets - 30 seconds hold - Standard Plank  - 3 x weekly - 5 sets - 15 seconds  hold - Seated Piriformis Stretch with Trunk Bend  - 3 x weekly - 3-5 reps - 10-30 seconds hold - Standing Anti-Rotation Press with Anchored Resistance  - 3 x weekly - 3 sets - 10 reps - 5 seconds hold  HOME EXERCISE PROGRAM [[email or web address]] View at [email or web address]" using code: [email or web address] KICKSTAND DEADLIFT -  Repeat 10 Repetitions, Complete 3 Sets, Perform 3 Times a Week  ASSESSMENT:  CLINICAL IMPRESSION:   Patient arrives with soreness today, largely due to increased demand at work.  Today's session focused on progressing her previous exercises to increase core and hip strength.  The kickstand deadlifts were modified such that a foam roller was put between her working leg and the wall (cueing adduction) and lateral lunges were added. She tolerated the session well, but felt some pulling in her lower T-spine when adding load to the kickstand deadlifts.  She could  somewhat mitigate it by engaging her core, but not enough to feel comfortable with the load.  The lateral lunges were challenging but doable, but she felt more pulling towards the end of her second set, which could suggest a lack of endurance.  Future sessions will work on further loading her core and lower extremities, especially in the transverse plane to simulate the transfers she needs to do at work.  Patient would benefit from continued management of limiting condition by skilled physical therapist to address remaining impairments and functional limitations to work towards stated goals and return to PLOF or maximal functional independence.   From Re-Evaluation 11/19/2022:  Patient is a 32 y.o. 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 Patient is returning to PT more than 90 days since last PT visit to  continue care for the same condition as before. She completed 5 physical therapy sessions with improvement in symptoms before her attendance lapsed. She now presents with worsening left LE symptoms and low back and left glute pain since her work environment changed. She had good response to physical therapy intervention in the past and good potential for improvement again with consistent attendance. Patient did demonstrate improvement in trunk endurance at today's goal assessment. Patient presents with significant pain, paresthesia, muscle tension, motor control, muscle performance (strength/power/endurance), and activity tolerance impairments that are limiting ability to complete her usual activities such as working, walking, prolonged standing, bending, lifting, yardwork, housework, sleeping, traveling, prolonged sitting, going to the beach, anything that requires repetitive bending or lifting without difficulty. Patient will benefit from skilled physical therapy intervention to address current body structure impairments and activity limitations to improve function and work towards goals set in current POC in order to return to prior level of function or maximal functional improvement.   OBJECTIVE IMPAIRMENTS: Abnormal gait, decreased activity tolerance, decreased balance, decreased endurance, decreased knowledge of condition, decreased mobility, difficulty walking, decreased ROM, decreased strength, increased fascial restrictions, impaired perceived functional ability, increased muscle spasms, impaired flexibility, improper body mechanics, postural dysfunction, obesity, and pain.   ACTIVITY LIMITATIONS: carrying, lifting, bending, sitting, standing, squatting, stairs, transfers, hygiene/grooming, locomotion level, and caring for others  PARTICIPATION LIMITATIONS: meal prep, cleaning, laundry, driving, shopping, community activity, occupation, yard work, and   working, walking, prolonged standing, bending,  lifting, yardwork, housework, sleeping, traveling, prolonged sitting, going to [employer], anything that requires repetitive bending or lifting   PERSONAL FACTORS: Fitness, Past/current experiences, Time since onset of injury/illness/exacerbation, and 1-2 comorbidities: lateral meniscus tear of left knee; Palpitation; Atypical chest pain; Family history of heart disease; Shortness of breath; and Sinus tachycardia on their problem list. past medical history of Acute meniscal tear of knee, Allergy, Anxiety, Asthma, Atrial fibrillation (HCC), COVID-19 (04/2020), Depression, Ovarian cyst, PVC (premature ventricular contraction), Scoliosis, Shingles, and Tachycardia. past surgical history that includes RIGHT INDEX  FINGER TENDON REPAIR (NOV 2013); Knee arthroscopy with lateral menisectomy (Left, 09/01/2012); and Meniscus repair (Right)  are also affecting patient's functional outcome.   REHAB POTENTIAL: Good  CLINICAL DECISION MAKING: Evolving/moderate complexity  EVALUATION COMPLEXITY: Moderate   GOALS: Goals reviewed with patient? Yes  SHORT TERM GOALS: Target date: 08/07/22. Target date updated to 12/04/2022 on 11/19/2022.   Pt will be independent with initial HEP in order to improve strength and balance in order to decrease  fall risk and improve function at home and work. Baseline:HEP given; patient not participating (11/19/2022); participating regularly (12/02/2022);  Goal status: MET   LONG TERM GOALS: Target date: 08/26/22. Target date updated to 02/11/2023 for all unmet goals on 11/19/2022. Target date updated to 04/14/2023 for all unmet goals on 01/20/2023.   Patient will demonstrate improved FOTO by equal or greater than 10 by visit #17 to demonstrate improvement in overall condition and self-reported functional ability.  Baseline: initial baseline 55 (07/01/2022); 60 at visit #17 (12/26/2022);  Goal status: in-progress  2.  Pt will decrease worst pain as reported on NPRS by at least 3 points in  order to demonstrate clinically significant reduction in pain. Baseline: 4/10 (07/01/2022): up to 10/10  (11/19/2022); up to 4-5/10 when burning two days ago, 2.5/10 when excluding the burning (12/06/2022); 3-4/10 in the last 2 weeks (01/20/2023);  Goal status: on-going  3.  Pt will demonstrate plank time of or more to demonstrate age-predicted match of core strength needed for job duties Baseline: 10sec (07/01/2022): 24 seconds (11/19/2022); 23 seconds (12/26/2022); 37 seconds (01/20/2023);  Goal status: In-progress  4.  Pt will demonstrate bering sorenson test of 34sec or more to demonstrate clinically significant increase in spinal extensor strength needed for job duties Baseline: 20sec (07/01/2022); 58 seconds (11/19/2022);  Goal status: MET  5.  Patient will be independent with initial home exercise program for self-management of symptoms. Baseline: HEP to be reviewed and updated as needed at visit 7 as appropriate (11/19/22); she has has been participating 2x a week plus PT sessions + what  is left over after PT session (12/26/2022);  Goal status: MET    PLAN:  PT FREQUENCY: 1-2x/week  PT DURATION: 12 weeks  PLANNED INTERVENTIONS: Therapeutic exercises, Therapeutic activity, Neuromuscular re-education, Balance training, Gait training, Patient/Family education, Self Care, Joint mobilization, Stair training, Dry Needling, Electrical stimulation, Spinal mobilization, Cryotherapy, Moist heat, Manual therapy, and Re-evaluation.  PLAN FOR NEXT SESSION:  update HEP as appropriate, progressive core/LE/functional strengthening, consider repeated motions assessment, education. Manual therapy / dry needling as needed.   Consider HEP update, weaning from dry needling, progressing core and functional strengthening. Consider using weight in both hand for the kickstand dead lift to even out upper back rotation.   Ennis Delpozo, [place], Student-PT   [place]. [name], PT, DPT 03/13/23, 7:23 PM  [facility]  [facility] [address] [place], [place] [address] P: [phone] I F: [phone]

## 2023-03-17 ENCOUNTER — Encounter: Payer: Self-pay | Admitting: Cardiovascular Disease

## 2023-03-18 ENCOUNTER — Other Ambulatory Visit: Payer: Self-pay

## 2023-03-18 ENCOUNTER — Ambulatory Visit: Payer: 59 | Admitting: Physical Therapy

## 2023-03-18 DIAGNOSIS — M792 Neuralgia and neuritis, unspecified: Secondary | ICD-10-CM

## 2023-03-18 DIAGNOSIS — M5459 Other low back pain: Secondary | ICD-10-CM

## 2023-03-18 DIAGNOSIS — M62838 Other muscle spasm: Secondary | ICD-10-CM

## 2023-03-18 MED ORDER — AMPHETAMINE-DEXTROAMPHET ER 20 MG PO CP24
20.0000 mg | ORAL_CAPSULE | Freq: Every day | ORAL | 0 refills | Status: DC
  Filled 2023-03-18: qty 30, 30d supply, fill #0

## 2023-03-18 NOTE — Therapy (Cosign Needed)
OUTPATIENT PHYSICAL THERAPY TREATMENT  Patient Name: Theresa Ortiz MRN: [id number] DOB:26-Jun-1990, 32 y.o., female Today's Date: 03/19/2023  END OF SESSION:    PT End of Session - 03/19/23 1536     Visit Number 28    Number of Visits 30    Date for PT Re-Evaluation 04/14/23    Authorization Type [place] [employer] FOCUS reporting period from 12/02/2022    Progress Note Due on Visit 30    PT Start Time 1652    PT Stop Time 1730    PT Time Calculation (min) 38 min    Activity Tolerance Patient tolerated treatment well    Behavior During Therapy [facility] for tasks assessed/performed                Past Medical History:  Diagnosis Date   Acute meniscal tear of knee    left knee   Allergy    Anxiety    Asthma    Atrial fibrillation (HCC)    slight   COVID-19 04/2020   Depression    Ovarian cyst    PVC (premature ventricular contraction)    Scoliosis    Shingles    Tachycardia    Past Surgical History:  Procedure Laterality Date   KNEE ARTHROSCOPY WITH LATERAL MENISECTOMY Left 09/01/2012   Procedure: LEFT KNEE ARTHROSCOPY WITH LATERAL MENISCECTOMY ;  Surgeon: Jacki Cones, MD;  Location: [facility];  Service: Orthopedics;  Laterality: Left;   MENISCUS REPAIR Right    RIGHT INDEX  FINGER TENDON REPAIR  NOV 2013   Patient Active Problem List   Diagnosis Date Noted   Palpitation 07/13/2018   Atypical chest pain 07/13/2018   Family history of heart disease 07/13/2018   Shortness of breath 07/13/2018   Sinus tachycardia 07/13/2018   Acute lateral meniscus tear of left knee 09/01/2012    PCP: Tisovec, Adelfa Koh, MD  REFERRING PROVIDER: Filomena Jungling, MD  REFERRING DIAG: 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 Pt reports she has some pain at the mid calf that is the size of packing tape that feels like a cramp. She reports she has tension at L low back and glute. LBP/glute pain aggravated by lifting/moving patients, and bending forward. Has had L sided sciatic symptoms 1x/week when lifting something heavy down post LLE. She hs previously had pain with prolonged walking, but that has subsided over the past year or so. Has had successful PT in the past with [facility], which she thinks is very helpful. Pt works full time as an Engineer, structural, enjoys traveling and walking her dog. Comorbid conditions as listed above. No falls in past 6 months.  denies N/V, B&B changes, unexplained weight fluctuation, saddle paresthesia, fever, night sweats, or unrelenting night pain at this time. Patient denies hx of cancer, stroke, seizures, diabetes, unexplained weight loss, unexplained changes in bowel or bladder problems, unexplained stumbling or dropping things, osteoporosis, and spinal surgery  SUBJECTIVE:  SUBJECTIVE STATEMENT:   Patient reports that work has still been more intense and she started moving last weekend which was more taxing on her body.  She did her HEP twice between last session and today. She feels most of the pain around her left glute and SIJ.    PAIN:  NPRS: 1.5/10 left buttocks  PATIENT GOALS: Get the cramp out of my calf, to "eleviate whatever pissed it off this time out of the leg"   OBJECTIVE  TODAY'S TREATMENT:   Therapeutic exercise: to centralize symptoms and improve ROM, strength, muscular endurance, and activity tolerance required for successful completion of functional activities Therapeutic  activities: dynamic exercise for functional strengthening and improved functional activity tolerance. Neuromuscular Re-education: to improve, balance, postural strength, muscle activation patterns, and stabilization strength required for functional activities.     Sled 5 mins with 60 lbs - cues to drive through the glutes theract  Superset: 3 rounds  Standing clams 2x15 per side with green band, x15 on airex without band (hip ER on airex) there   Knee plank color taps 30 secs per arm - highest count was 65 taps Neuro re-ed        Quadruped bird dog repeaters with (contralateral elbow/knee taps to reach)  2x15 each side with cone over sacrum to provided feedback.  Neuro-re-ed   Kickstand RDL with foam roller on outside leg 2x15 per side Neuro-re-ed    Pt required multimodal cuing for proper technique and to facilitate improved neuromuscular control, strength, range of motion, and functional ability resulting in improved performance and form.  PATIENT EDUCATION:  Education details: Exercise purpose/form. Self management techniques. Education on diagnosis, prognosis, POC, anatomy and physiology of current condition. Education on dry needling.  Person educated: Patient Education method: Medical illustrator Education comprehension: verbalized understanding and returned demonstration  HOME EXERCISE PROGRAM: Access Code: [email or web address] URL: [email or web address] Date: 02/12/2023 Prepared by: Norton Blizzard  Exercises - Seated Pelvic Floor Elevators  - 1 x daily - 2 sets - 10 reps - Seated Quick Flick Pelvic Floor Contractions  - 1 x daily - 2 sets - 10 reps - Seated Pelvic Floor Contraction  - 1 x daily - 2 sets - 10 reps - Supine Sciatic Nerve Glide  - 1-2 x daily - 7 x weekly - 10 reps - Bird Dog  - 3 x weekly - 2 sets - 10 reps - 5 second hold - Dead Bug  - 3 x weekly - 3 sets - 5-10 reps - Barbell Thruster  - 3 x weekly - 3 sets - 10 reps - Forearm Side Plank on  Counter  - 3 x weekly - 3 sets - 30 seconds hold - Standard Plank  - 3 x weekly - 5 sets - 15 seconds hold - Seated Piriformis Stretch with Trunk Bend  - 3 x weekly - 3-5 reps - 10-30 seconds hold - Standing Anti-Rotation Press with Anchored Resistance  - 3 x weekly - 3 sets - 10 reps - 5 seconds hold  HOME EXERCISE PROGRAM [[email or web address]] View at [email or web address]" using code: [email or web address] KICKSTAND DEADLIFT -  Repeat 10 Repetitions, Complete 3 Sets, Perform 3 Times a Week  ASSESSMENT:  CLINICAL IMPRESSION:   Patient arrives feeling mostly the same since last session. Today's focus was on continuing to progress her core and hip strength and motor control exercises. The sled was also added to increase glute activation and functional activity tolerance while mimicking tasks she does at work.  Modified planks with  UE taps were added to the session to increase her anti-rotation strength, endurance, and motor control, and side-lying clamshells were progressed to a weight-bearing variation to increase the load through her hips. Patient tolerated session well without any increase in pain.  Future sessions will continue working on loading her core and lower extremities and adding more exercises in the transverse plane to simulate the transfers she needs to do at work.  Patient would benefit from continued management of limiting condition by skilled physical therapist to address remaining impairments and functional limitations to work towards stated goals and return to PLOF or maximal functional independence.   From Re-Evaluation 11/19/2022:  Patient is a 32 y.o. 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 Patient is returning to PT more than 90 days since last PT visit to continue  care for the same condition as before. She completed 5 physical therapy sessions with improvement in symptoms before her attendance lapsed. She now presents with worsening left LE symptoms and low back and left glute pain since her work environment changed. She had good response to physical therapy intervention in the past and good potential for improvement again with consistent attendance. Patient did demonstrate improvement in trunk endurance at today's goal assessment. Patient presents with significant pain, paresthesia, muscle tension, motor control, muscle performance (strength/power/endurance), and activity tolerance impairments that are limiting ability to complete her usual activities such as working, walking, prolonged standing, bending, lifting, yardwork, housework, sleeping, traveling, prolonged sitting, going to the beach, anything that requires repetitive bending or lifting without difficulty. Patient will benefit from skilled physical therapy intervention to address current body structure impairments and activity limitations to improve function and work towards goals set in current POC in order to return to prior level of function or maximal functional improvement.   OBJECTIVE IMPAIRMENTS: Abnormal gait, decreased activity tolerance, decreased balance, decreased endurance, decreased knowledge of condition, decreased mobility, difficulty walking, decreased ROM, decreased strength, increased fascial restrictions, impaired perceived functional ability, increased muscle spasms, impaired flexibility, improper body mechanics, postural dysfunction, obesity, and pain.   ACTIVITY LIMITATIONS: carrying, lifting, bending, sitting, standing, squatting, stairs, transfers, hygiene/grooming, locomotion level, and caring for others  PARTICIPATION LIMITATIONS: meal prep, cleaning, laundry, driving, shopping, community activity, occupation, yard work, and   working, walking, prolonged standing, bending, lifting,  yardwork, housework, sleeping, traveling, prolonged sitting, going to [employer], anything that requires repetitive bending or lifting   PERSONAL FACTORS: Fitness, Past/current experiences, Time since onset of injury/illness/exacerbation, and 1-2 comorbidities: lateral meniscus tear of left knee; Palpitation; Atypical chest pain; Family history of heart disease; Shortness of breath; and Sinus tachycardia on their problem list. past medical history of Acute meniscal tear of knee, Allergy, Anxiety, Asthma, Atrial fibrillation (HCC), COVID-19 (04/2020), Depression, Ovarian cyst, PVC (premature ventricular contraction), Scoliosis, Shingles, and Tachycardia. past surgical history that includes RIGHT INDEX  FINGER TENDON REPAIR (NOV 2013); Knee arthroscopy with lateral menisectomy (Left, 09/01/2012); and Meniscus repair (Right)  are also affecting patient's functional outcome.   REHAB POTENTIAL: Good  CLINICAL DECISION MAKING: Evolving/moderate complexity  EVALUATION COMPLEXITY: Moderate   GOALS: Goals reviewed with patient? Yes  SHORT TERM GOALS: Target date: 08/07/22. Target date updated to 12/04/2022 on 11/19/2022.   Pt will be independent with initial HEP in order to improve strength and balance in order to  decrease fall risk and improve function at home and work. Baseline:HEP given; patient not participating (11/19/2022); participating regularly (12/02/2022);  Goal status: MET   LONG TERM GOALS: Target date: 08/26/22. Target date updated to 02/11/2023 for all unmet goals on 11/19/2022. Target date updated to 04/14/2023 for all unmet goals on 01/20/2023.   Patient will demonstrate improved FOTO by equal or greater than 10 by visit #17 to demonstrate improvement in overall condition and self-reported functional ability.  Baseline: initial baseline 55 (07/01/2022); 60 at visit #17 (12/26/2022);  Goal status: in-progress  2.  Pt will decrease worst pain as reported on NPRS by at least 3 points in order to  demonstrate clinically significant reduction in pain. Baseline: 4/10 (07/01/2022): up to 10/10  (11/19/2022); up to 4-5/10 when burning two days ago, 2.5/10 when excluding the burning (12/06/2022); 3-4/10 in the last 2 weeks (01/20/2023);  Goal status: on-going  3.  Pt will demonstrate plank time of or more to demonstrate age-predicted match of core strength needed for job duties Baseline: 10sec (07/01/2022): 24 seconds (11/19/2022); 23 seconds (12/26/2022); 37 seconds (01/20/2023);  Goal status: In-progress  4.  Pt will demonstrate bering sorenson test of 34sec or more to demonstrate clinically significant increase in spinal extensor strength needed for job duties Baseline: 20sec (07/01/2022); 58 seconds (11/19/2022);  Goal status: MET  5.  Patient will be independent with initial home exercise program for self-management of symptoms. Baseline: HEP to be reviewed and updated as needed at visit 7 as appropriate (11/19/22); she has has been participating 2x a week plus PT sessions + what  is left over after PT session (12/26/2022);  Goal status: MET    PLAN:  PT FREQUENCY: 1-2x/week  PT DURATION: 12 weeks  PLANNED INTERVENTIONS: Therapeutic exercises, Therapeutic activity, Neuromuscular re-education, Balance training, Gait training, Patient/Family education, Self Care, Joint mobilization, Stair training, Dry Needling, Electrical stimulation, Spinal mobilization, Cryotherapy, Moist heat, Manual therapy, and Re-evaluation.  PLAN FOR NEXT SESSION:  update HEP as appropriate, progressive core/LE/functional strengthening, consider repeated motions assessment, education. Manual therapy / dry needling as needed.   Consider HEP update, weaning from dry needling, progressing core and functional strengthening. Consider using weight in both hand for the kickstand dead lift to even out upper back rotation.   [place], [place], Student-PT   [place]. [name], PT, DPT 03/19/23, 3:54 PM  [facility]  [facility] [address] [place], [place] [address] P: [phone] I F: [phone]

## 2023-03-19 ENCOUNTER — Encounter: Payer: Self-pay | Admitting: Physical Therapy

## 2023-03-20 ENCOUNTER — Encounter: Payer: 59 | Admitting: Physical Therapy

## 2023-03-21 ENCOUNTER — Encounter: Payer: Self-pay | Admitting: Physical Therapy

## 2023-03-25 ENCOUNTER — Encounter: Payer: Self-pay | Admitting: Physical Therapy

## 2023-03-25 ENCOUNTER — Telehealth: Payer: Self-pay

## 2023-03-25 ENCOUNTER — Ambulatory Visit: Payer: 59 | Admitting: Physical Therapy

## 2023-03-25 DIAGNOSIS — M5459 Other low back pain: Secondary | ICD-10-CM

## 2023-03-25 DIAGNOSIS — M792 Neuralgia and neuritis, unspecified: Secondary | ICD-10-CM | POA: Diagnosis not present

## 2023-03-25 DIAGNOSIS — M62838 Other muscle spasm: Secondary | ICD-10-CM

## 2023-03-25 NOTE — Therapy (Addendum)
OUTPATIENT PHYSICAL THERAPY TREATMENT  Patient Name: Theresa Ortiz MRN: [id number] DOB:Sep 01, 1990, 32 y.o., female Today's Date: 03/25/2023  END OF SESSION:    PT End of Session - 03/25/23 1815     Visit Number 29    Number of Visits 30    Date for PT Re-Evaluation 04/14/23    Authorization Type [place] [employer] FOC[employer] reporting period from 12/02/2022    Progress Note Due on Visit 30    PT Start Time 1650    PT Stop Time 1740    PT Time Calculation (min) 50 min    Activity Tolerance Patient tolerated treatment well    Behavior During Therapy [facility] for tasks assessed/performed                 Past Medical History:  Diagnosis Date   Acute meniscal tear of knee    left knee   Allergy    Anxiety    Asthma    Atrial fibrillation (HCC)    slight   COVID-19 04/2020   Depression    Ovarian cyst    PVC (premature ventricular contraction)    Scoliosis    Shingles    Tachycardia    Past Surgical History:  Procedure Laterality Date   KNEE ARTHROSCOPY WITH LATERAL MENISECTOMY Left 09/01/2012   Procedure: LEFT KNEE ARTHROSCOPY WITH LATERAL MENISCECTOMY ;  Surgeon: Jacki Cones, MD;  Location: [facility];  Service: Orthopedics;  Laterality: Left;   MENISCUS REPAIR Right    RIGHT INDEX  FINGER TENDON REPAIR  NOV 2013   Patient Active Problem List   Diagnosis Date Noted   Palpitation 07/13/2018   Atypical chest pain 07/13/2018   Family history of heart disease 07/13/2018   Shortness of breath 07/13/2018   Sinus tachycardia 07/13/2018   Acute lateral meniscus tear of left knee 09/01/2012    PCP: Tisovec, Adelfa Koh, MD  REFERRING PROVIDER: Filomena Jungling, MD  REFERRING DIAG: 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 Pt reports she has some pain at the mid calf that is the size of packing tape that feels like a cramp. She reports she has tension at L low back and glute. LBP/glute pain aggravated by lifting/moving patients, and bending forward. Has had L sided sciatic symptoms 1x/week when lifting something heavy down post LLE. She hs previously had pain with prolonged walking, but that has subsided over the past year or so. Has had successful PT in the past with [facility], which she thinks is very helpful. Pt works full time as an Engineer, structural, enjoys traveling and walking her dog. Comorbid conditions as listed above. No falls in past 6 months.  denies N/V, B&B changes, unexplained weight fluctuation, saddle paresthesia, fever, night sweats, or unrelenting night pain at this time. Patient denies hx of cancer, stroke, seizures, diabetes, unexplained weight loss, unexplained changes in bowel or bladder problems, unexplained stumbling or dropping things, osteoporosis, and spinal surgery  SUBJECTIVE:  SUBJECTIVE STATEMENT:   Did a split stretch while completing a pick up a bag challenge and fell forwards at work on Thursday morning with no pain during or immediately after.  About 1-1.5 hours later she had onset of very irritating pain in her left glute/buttocks that was sharp and with some popping with weight bearing, going up and down stairs, sitting down and standing up, and getting in/out of the car. It was hard to walk and work on Thursday. She spent a lot of time with ice and heat and decreased activity over the weekend and it is better now but still bothering her with similar activities (less so with walking). She Googled it  and tried to do some stretches to help it until she read that that can make it worse, so she stopped. She notes that putting some pressure on the area with her hand seems to make it hurt a little less.  She has also been having some N+T in the plantar surface of the left forefoot, twice today after moving patients at work. This had been better for the last few weeks. She states the left glute pain feels different than her usual buttock pain and she has had some discomfort spread towards the medial thigh.   PAIN:  NPRS: 1.5/10 left buttocks  PATIENT GOALS: Get the cramp out of my calf, to "eleviate whatever pissed it off this time out of the leg"   OBJECTIVE  PROM - B hip IR/ER/flexion in supine with minimal reproduction of concordant pain and negative for joint catching. L hip passive adduction with concordant buttock pain.   RESISTIVE MUSCLE TESTING - resisted left hip extension in quadruped with knee flexed negative for reproduction of pain  ACCESSORY MOTION  - supine LAD through left hip produced mild non-lasting relief - CPA to upper lumbar spine mildly reproduced concordant left glute pain, CPA to proximal sacrum slightly decreased pain at least in short term.  - posterior L hip capsule mobilization did not reproduce concordant pain  PALPATION - TTP throughout left posterior hip/deep rotator region, worst just lateral to sacrum, but not as specific or reproducible as would be expected for muscle tension as primary source of pain.   HIP SPECIAL TESTS FADDIR: R = negative, L = negative. SCOUR: R = negative, L = negative Fitzgerald Test: R = negative, L = negative  LOWER LIMB NEURODYNAMIC TESTS Straight Leg Raise (Sciatic nerve)  R  = positive for neural tension at posterior thigh, but not concordant  L  = positive for neural tension at posterior thigh, but not concordant   TODAY'S TREATMENT:     Therapeutic exercise: to centralize symptoms and improve ROM, strength, muscular  endurance, and activity tolerance required for successful completion of functional activities.  - testing to further assess patient's new/worse pain (see resistive muscle testing above) - asterisk sign assessment before/after/between other assessment techniques to help determine results of intervention/test. (Asterisk signs passive hip adduction in 90 degrees hip flexion, sit <> stand, walking or weight bearing at L LE).  - education on findings and recommendations for self-management  Manual therapy: to reduce pain and tissue tension, improve range of motion, neuromodulation, in order to promote improved ability to complete functional activities. PRONE - STM/palpation to structures of left buttocks, posterior and lateral hip - PROM L piriformis pin and stretch with contract-relax technique for L piriformis while moving further into hip IR. One cycle, felt strong stretch at end range but no relief.  - left hip  long axis distraction, 2x30 seconds (non-lasting decrease in concordant pain, difficulty tolerating CPAs due to known generalized hypersensitivity to touch/pressure) - CPA to lumbar spine and sacrum grade III to assess response, then 2x30 seconds grade II-IV (mild improvement during).  SUPINE - L hip PROM with end range OP to assess pain/end feel, special tests as described above - B SLR test to assess for nerve tension - left posterior hip capsule mobilization, grade III-IV, 1x20 seconds   PATIENT EDUCATION:  Education details: Exercise purpose/form. Self management techniques. Education on diagnosis, prognosis, POC, anatomy and physiology of current condition.   Person educated: Patient Education method: Medical illustrator Education comprehension: verbalized understanding and returned demonstration  HOME EXERCISE PROGRAM: Access Code: [email or web address] URL: [email or web address] Date: 02/12/2023 Prepared by: Norton Blizzard  Exercises - Seated Pelvic Floor Elevators   - 1 x daily - 2 sets - 10 reps - Seated Quick Flick Pelvic Floor Contractions  - 1 x daily - 2 sets - 10 reps - Seated Pelvic Floor Contraction  - 1 x daily - 2 sets - 10 reps - Supine Sciatic Nerve Glide  - 1-2 x daily - 7 x weekly - 10 reps - Bird Dog  - 3 x weekly - 2 sets - 10 reps - 5 second hold - Dead Bug  - 3 x weekly - 3 sets - 5-10 reps - Barbell Thruster  - 3 x weekly - 3 sets - 10 reps - Forearm Side Plank on Counter  - 3 x weekly - 3 sets - 30 seconds hold - Standard Plank  - 3 x weekly - 5 sets - 15 seconds hold - Seated Piriformis Stretch with Trunk Bend  - 3 x weekly - 3-5 reps - 10-30 seconds hold - Standing Anti-Rotation Press with Anchored Resistance  - 3 x weekly - 3 sets - 10 reps - 5 seconds hold  HOME EXERCISE PROGRAM [[email or web address]] View at [email or web address]" using code: [email or web address] KICKSTAND DEADLIFT -  Repeat 10 Repetitions, Complete 3 Sets, Perform 3 Times a Week  ASSESSMENT:  CLINICAL IMPRESSION:   Patient arrives reporting new/worsened pain in the left glute that is improving but made it difficult to walk and seems to have occurred for no apparent reason or possibly after as lateral split stretch position that she fell out of while playing a game. Today's session focused on trying to identify the primary musculoskeletal contributor to pain. She got temporary but not lasting relief from left hip LAD, which does not rule out an intra-articular source. She was TTP at left glute but not as specifically as expected for hip soft tissue to be the primary source. She did have reproduction of concordant pain with CPA to upper lumbar spine and mild relief with CPA to sacrum, which suggests somatic referral from low back in the absence of positive SLR. She did report mild improvement overall by end of session. Overall, it was not abundantly clear what musculoskeletal pain generator could be primary in her pain flair. Recommended she start returning to prior exercises as tolerated and  plan to work at continuing exercises for improved endurance, motor control, and strength next session as appropriate. Patient does have excessive B hip IR and ER that could be contributing to clinical instability in the hips that further aggravate her symptoms. Patient reported feeling slightly better by end of session.  Patient would benefit from continued management of limiting condition by skilled physical therapist to address remaining impairments and functional limitations to work towards stated goals  and return to PLOF or maximal functional independence.   From Re-Evaluation 11/19/2022:  Patient is a 32 y.o. 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 Patient is returning to PT more than 90 days since last PT visit to continue care for the same condition as before. She completed 5 physical therapy sessions with improvement in symptoms before her attendance lapsed. She now presents with worsening left LE symptoms and low back and left glute pain since her work environment changed. She had good response to physical therapy intervention in the past and good potential for improvement again with consistent attendance. Patient did demonstrate improvement in trunk endurance at today's goal assessment. Patient presents with significant pain, paresthesia, muscle tension, motor control, muscle performance (strength/power/endurance), and activity tolerance impairments that are limiting ability to complete her usual activities such as working, walking, prolonged standing, bending, lifting, yardwork, housework, sleeping, traveling, prolonged sitting, going to the beach, anything that requires repetitive bending or lifting without difficulty. Patient will benefit from skilled physical  therapy intervention to address current body structure impairments and activity limitations to improve function and work towards goals set in current POC in order to return to prior level of function or maximal functional improvement.   OBJECTIVE IMPAIRMENTS: Abnormal gait, decreased activity tolerance, decreased balance, decreased endurance, decreased knowledge of condition, decreased mobility, difficulty walking, decreased ROM, decreased strength, increased fascial restrictions, impaired perceived functional ability, increased muscle spasms, impaired flexibility, improper body mechanics, postural dysfunction, obesity, and pain.   ACTIVITY LIMITATIONS: carrying, lifting, bending, sitting, standing, squatting, stairs, transfers, hygiene/grooming, locomotion level, and caring for others  PARTICIPATION LIMITATIONS: meal prep, cleaning, laundry, driving, shopping, community activity, occupation, yard work, and   working, walking, prolonged standing, bending, lifting, yardwork, housework, sleeping, traveling, prolonged sitting, going to [employer], anything that requires repetitive bending or lifting   PERSONAL FACTORS: Fitness, Past/current experiences, Time since onset of injury/illness/exacerbation, and 1-2 comorbidities: lateral meniscus tear of left knee; Palpitation; Atypical chest pain; Family history of heart disease; Shortness of breath; and Sinus tachycardia on their problem list. past medical history of Acute meniscal tear of knee, Allergy, Anxiety, Asthma, Atrial fibrillation (HCC), COVID-19 (04/2020), Depression, Ovarian cyst, PVC (premature ventricular contraction), Scoliosis, Shingles, and Tachycardia. past surgical history that includes RIGHT INDEX  FINGER TENDON REPAIR (NOV 2013); Knee arthroscopy with lateral menisectomy (Left, 09/01/2012); and Meniscus repair (Right)  are also affecting patient's functional outcome.   REHAB POTENTIAL: Good  CLINICAL DECISION MAKING: Evolving/moderate  complexity  EVALUATION COMPLEXITY: Moderate   GOALS: Goals reviewed with patient? Yes  SHORT TERM GOALS: Target date: 08/07/22. Target date updated to 12/04/2022 on 11/19/2022.   Pt will be independent with initial HEP in order to improve strength and balance in order to decrease fall risk and improve function at home and work. Baseline:HEP given; patient not participating (11/19/2022); participating regularly (12/02/2022);  Goal status: MET   LONG TERM GOALS: Target date: 08/26/22. Target date updated to 02/11/2023 for all unmet goals on 11/19/2022. Target date updated to 04/14/2023 for all unmet goals on 01/20/2023.   Patient will demonstrate improved FOTO by equal or greater than 10 by visit #17 to demonstrate improvement in overall condition and self-reported functional ability.  Baseline: initial baseline 55 (07/01/2022); 60 at visit #17 (12/26/2022);  Goal status: in-progress  2.  Pt  will decrease worst pain as reported on NPRS by at least 3 points in order to demonstrate clinically significant reduction in pain. Baseline: 4/10 (07/01/2022): up to 10/10  (11/19/2022); up to 4-5/10 when burning two days ago, 2.5/10 when excluding the burning (12/06/2022); 3-4/10 in the last 2 weeks (01/20/2023);  Goal status: on-going  3.  Pt will demonstrate plank time of or more to demonstrate age-predicted match of core strength needed for job duties Baseline: 10sec (07/01/2022): 24 seconds (11/19/2022); 23 seconds (12/26/2022); 37 seconds (01/20/2023);  Goal status: In-progress  4.  Pt will demonstrate bering sorenson test of 34sec or more to demonstrate clinically significant increase in spinal extensor strength needed for job duties Baseline: 20sec (07/01/2022); 58 seconds (11/19/2022);  Goal status: MET  5.  Patient will be independent with initial home exercise program for self-management of symptoms. Baseline: HEP to be reviewed and updated as needed at visit 7 as appropriate (11/19/22); she has has been  participating 2x a week plus PT sessions + what  is left over after PT session (12/26/2022);  Goal status: MET    PLAN:  PT FREQUENCY: 1-2x/week  PT DURATION: 12 weeks  PLANNED INTERVENTIONS: Therapeutic exercises, Therapeutic activity, Neuromuscular re-education, Balance training, Gait training, Patient/Family education, Self Care, Joint mobilization, Stair training, Dry Needling, Electrical stimulation, Spinal mobilization, Cryotherapy, Moist heat, Manual therapy, and Re-evaluation.  PLAN FOR NEXT SESSION:  update HEP as appropriate, progressive core/LE/functional strengthening, consider repeated motions assessment, education. Manual therapy / dry needling as needed.   Consider HEP update, weaning from dry needling, progressing core and functional strengthening. Consider using weight in both hand for the kickstand dead lift to even out upper back rotation.   Stefon Ramthun, [place], Student-PT   [place]. [name], PT, DPT 03/25/23, 7:04 PM  [facility] [address] [place], [place] [address] P: [phone] I F: [phone]

## 2023-03-25 NOTE — Progress Notes (Signed)
Watchpat Itamar sleep study corrected 03/25/23.

## 2023-03-25 NOTE — Addendum Note (Signed)
Addended by: Brunetta Genera on: 03/25/2023 03:38 PM   Modules accepted: Orders

## 2023-03-25 NOTE — Telephone Encounter (Signed)
Notified patient that her device has not been authorized yet and I will reach out to her with a PIN for her device as soon as possible. All questions were answered and patient verbalized understanding.

## 2023-03-26 ENCOUNTER — Other Ambulatory Visit: Payer: Self-pay

## 2023-03-26 ENCOUNTER — Encounter: Payer: 59 | Admitting: Physical Therapy

## 2023-04-01 ENCOUNTER — Ambulatory Visit: Payer: 59 | Attending: Physical Medicine & Rehabilitation

## 2023-04-01 DIAGNOSIS — M5459 Other low back pain: Secondary | ICD-10-CM | POA: Diagnosis not present

## 2023-04-01 DIAGNOSIS — M792 Neuralgia and neuritis, unspecified: Secondary | ICD-10-CM | POA: Insufficient documentation

## 2023-04-01 DIAGNOSIS — M62838 Other muscle spasm: Secondary | ICD-10-CM | POA: Insufficient documentation

## 2023-04-01 NOTE — Therapy (Cosign Needed)
OUTPATIENT PHYSICAL THERAPY TREATMENT/  PROGRESS NOTE 01/20/23-04/01/23  Patient Name: Theresa Ortiz MRN: [id number] DOB:1990/05/21, 32 y.o., female Today's Date: 04/01/2023  END OF SESSION:    PT End of Session - 04/01/23 1858     Visit Number 30    Number of Visits 30    Date for PT Re-Evaluation 04/14/23    Authorization Type [place] [employer] FOC[employer] reporting period from 12/02/2022    Progress Note Due on Visit 30    PT Start Time 1650    PT Stop Time 1738    PT Time Calculation (min) 48 min    Activity Tolerance Patient tolerated treatment well;Patient limited by pain    Behavior During Therapy [facility] for tasks assessed/performed                  Past Medical History:  Diagnosis Date   Acute meniscal tear of knee    left knee   Allergy    Anxiety    Asthma    Atrial fibrillation (HCC)    slight   COVID-19 04/2020   Depression    Ovarian cyst    PVC (premature ventricular contraction)    Scoliosis    Shingles    Tachycardia    Past Surgical History:  Procedure Laterality Date   KNEE ARTHROSCOPY WITH LATERAL MENISECTOMY Left 09/01/2012   Procedure: LEFT KNEE ARTHROSCOPY WITH LATERAL MENISCECTOMY ;  Surgeon: Jacki Cones, MD;  Location: [facility];  Service: Orthopedics;  Laterality: Left;   MENISCUS REPAIR Right    RIGHT INDEX  FINGER TENDON REPAIR  NOV 2013   Patient Active Problem List   Diagnosis Date Noted   Palpitation 07/13/2018   Atypical chest pain 07/13/2018   Family history of heart disease 07/13/2018   Shortness of breath 07/13/2018   Sinus tachycardia 07/13/2018   Acute lateral meniscus tear of left knee 09/01/2012    PCP: Tisovec, Adelfa Koh, MD  REFERRING PROVIDER: Filomena Jungling, MD  REFERRING DIAG: 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 Pt reports she has some pain at the mid calf that is the size of packing tape that feels like a cramp. She reports she has tension at L low back and glute. LBP/glute pain aggravated by lifting/moving patients, and bending forward. Has had L sided sciatic symptoms 1x/week when lifting something heavy down post LLE. She hs previously had pain with prolonged walking, but that has subsided over the past year or so. Has had successful PT in the past with [facility], which she thinks is very helpful. Pt works full time as an Engineer, structural, enjoys traveling and walking her dog. Comorbid conditions as listed above. No falls in past 6 months. Pt denies N/V, B&B changes, unexplained weight fluctuation, saddle paresthesia, fever, night sweats, or unrelenting night pain at this time. Patient denies hx of cancer, stroke, seizures, diabetes, unexplained weight loss, unexplained changes in bowel or bladder problems, unexplained stumbling or dropping things, osteoporosis, and spinal surgery.  SUBJECTIVE:  SUBJECTIVE STATEMENT:   Pt reports continuation of acute pain exacerbation with specific activities, up and down stairs, rolling in bed, however pt denies any symptoms in back or calf.  Patient has done HEP twice since last session, both times without symptoms provocation.  Pt felt very sore after last session and disappointed that she didn't have any immediate relief thereafter. Pt reports attempts to modify her job tasks as not to provoke her pain, I.e. asking patients to participate more in their transfers.  Her worst pain in the past 2 weeks has been a 1.5 on  NPRS.  PAIN:  NPRS: 0.5/10 left buttocks (got to a 0.75/10 cleaning).  PATIENT GOALS: Get the cramp out of my calf, to "eleviate whatever pissed it off this time out of the leg"   OBJECTIVE  OBJECTIVE  SELF-REPORTED FUNCTION 04/01/23 FOTO score: 66/100 (Lumbar Spine questionnaire)  STRENGTH TESTING (MMT) 04/01/23 Hip Flexion:  Right: 5/5, Left: 4+/5  Hip Extension:  Right: 4/5* (concordant pain superficial to the Left posterior SIJ), Left: 5/5  Hip ABD   Right: 4/5, Left: 4/5  Hip ER:   Right: 4+/5, Left: 5/5  Hip IR:   Right: 5/5, Left: 5/5  Knee Flexion:  Right: 5/5, Left: 5/5    Knee Extension:  Right: 5/5, Left: 5/5  Ankle DF:  Right: 4+/5, 4+/5  PALPATION IN PRONE *pt complains of focal intermittent mechanical pain gesturing to the left posterior SIJ.   Palpation of posterior pelvis in prone, notable for slight transient relief in resting pain levels during P to A glide of sacrum. Pt has tenderness along the left lateral boarder of posterior sacroiliac ligament and left lateral sacrotuberous ligament ligament. Pt denies any specific tenderness to taut bands identified in left medial gluteus maximus in this area, but does have significant tenderness to Left posterolateral most fibers of gluteus medius, which refer pain into lower portions of the left buttocks superficial to the ischial tuberosity and decrease in tenderness with sustained release of tissue, wherein a slow progression decrease in resting muscle tension can be felt, as well as intermittent twitch responses here.    TODAY'S TREATMENT:    - Gait Assessment: No correlation with patient symptoms today, notable bilat trendelenburg, Rt > Lt in magnitude, Rt side appears to go to end range of joint with hard end feel   - SLS Assessment: No correlation with patient symptoms; both with moderate difficulty, frequent LOB resultant of hip righting failure, several false starts prior to performance > 10 seconds,  left stance is most stable in a compensated posture.  Strength Testing (see above)  Therapeutic exercise: to centralize symptoms and improve ROM, strength, muscular endurance, and activity tolerance required for successful completion of functional activities.   - Plank (test): 35 seconds  - Medball rainbows: x5/side (stopped due to concordant pain at left SIJ).  -3 points of release to Left posterior mid portion gluteus medius, followed by pin and stretched paired with Left hip P/ROM ER.    PATIENT EDUCATION:  Education details: Exercise purpose/form. Self management techniques. Education on diagnosis, prognosis, POC, anatomy and physiology of current condition.   Person educated: Patient Education method: Medical illustrator Education comprehension: verbalized understanding and returned demonstration  HOME EXERCISE PROGRAM: Access Code: [email or web address] URL: [email or web address] Date: 02/12/2023 Prepared by: Norton Blizzard  Exercises - Seated Pelvic Floor Elevators  - 1 x daily - 2 sets - 10 reps - Seated Quick Flick Pelvic Floor Contractions  - 1 x daily - 2 sets -  10 reps - Seated Pelvic Floor Contraction  - 1 x daily - 2 sets - 10 reps - Supine Sciatic Nerve Glide  - 1-2 x daily - 7 x weekly - 10 reps - Bird Dog  - 3 x weekly - 2 sets - 10 reps - 5 second hold - Dead Bug  - 3 x weekly - 3 sets - 5-10 reps - Barbell Thruster  - 3 x weekly - 3 sets - 10 reps - Forearm Side Plank on Counter  - 3 x weekly - 3 sets - 30 seconds hold - Standard Plank  - 3 x weekly - 5 sets - 15 seconds hold - Seated Piriformis Stretch with Trunk Bend  - 3 x weekly - 3-5 reps - 10-30 seconds hold - Standing Anti-Rotation Press with Anchored Resistance  - 3 x weekly - 3 sets - 10 reps - 5 seconds hold  HOME EXERCISE PROGRAM [[email or web address]] View at [email or web address]" using code: [email or web address] KICKSTAND DEADLIFT -  Repeat 10 Repetitions, Complete 3 Sets, Perform 3 Times a  Week  ASSESSMENT:  CLINICAL IMPRESSION:   Patient has attended 30 physical therapy sessions since starting her current episode of care on 07/01/2022.  Patient continues to demonstrate improvements in symptoms and function as well as decreased pain. She has met her modest FOTO goal and has almost met her pain goal.  Despite long term successes pt remains in subacute exacerbation after a mechanical fall 2 weeks ago- still experiencing pain although it has been improving with heat and decreased activity. She demonstrates improved strength in lower extremities, but would benefit from continued load progression in areas of remaining deficit. Pt would like to increase time between exacerbations prior to DC. Patient would benefit from continued management of limiting condition by skilled physical therapist to address remaining impairments and functional limitations to work towards stated goals and return to PLOF or maximal functional independence.    From Re-Evaluation 11/19/2022:  Patient is a 32 y.o. 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 Patient is returning to PT more than 90 days since last PT visit to continue care for the same condition as before. She completed 5 physical therapy sessions with improvement in symptoms before her attendance lapsed. She now presents with worsening left LE symptoms and low back and left glute pain since her work environment changed. She had good response to physical therapy intervention in the past and good potential for improvement again with consistent attendance. Patient did demonstrate improvement in trunk endurance at today's goal assessment. Patient presents with significant pain, paresthesia, muscle tension, motor control, muscle  performance (strength/power/endurance), and activity tolerance impairments that are limiting ability to complete her usual activities such as working, walking, prolonged standing, bending, lifting, yardwork, housework, sleeping, traveling, prolonged sitting, going to the beach, anything that requires repetitive bending or lifting without difficulty. Patient will benefit from skilled physical therapy intervention to address current body structure impairments and activity limitations to improve function and work towards goals set in current POC in order to return to prior level of function or maximal functional improvement.   OBJECTIVE IMPAIRMENTS: Abnormal gait, decreased activity tolerance, decreased balance, decreased endurance, decreased knowledge of condition, decreased mobility, difficulty walking, decreased ROM, decreased strength, increased fascial restrictions, impaired perceived functional ability, increased muscle spasms, impaired flexibility, improper  body mechanics, postural dysfunction, obesity, and pain.   ACTIVITY LIMITATIONS: carrying, lifting, bending, sitting, standing, squatting, stairs, transfers, hygiene/grooming, locomotion level, and caring for others  PARTICIPATION LIMITATIONS: meal prep, cleaning, laundry, driving, shopping, community activity, occupation, yard work, and   working, walking, prolonged standing, bending, lifting, yardwork, housework, sleeping, traveling, prolonged sitting, going to [employer], anything that requires repetitive bending or lifting   PERSONAL FACTORS: Fitness, Past/current experiences, Time since onset of injury/illness/exacerbation, and 1-2 comorbidities: lateral meniscus tear of left knee; Palpitation; Atypical chest pain; Family history of heart disease; Shortness of breath; and Sinus tachycardia on their problem list. past medical history of Acute meniscal tear of knee, Allergy, Anxiety, Asthma, Atrial fibrillation (HCC), COVID-19 (04/2020), Depression,  Ovarian cyst, PVC (premature ventricular contraction), Scoliosis, Shingles, and Tachycardia. past surgical history that includes RIGHT INDEX  FINGER TENDON REPAIR (NOV 2013); Knee arthroscopy with lateral menisectomy (Left, 09/01/2012); and Meniscus repair (Right)  are also affecting patient's functional outcome.   REHAB POTENTIAL: Good  CLINICAL DECISION MAKING: Evolving/moderate complexity  EVALUATION COMPLEXITY: Moderate   GOALS: Goals reviewed with patient? Yes  SHORT TERM GOALS: Target date: 08/07/22. Target date updated to 12/04/2022 on 11/19/2022.   Pt will be independent with initial HEP in order to improve strength and balance in order to decrease fall risk and improve function at home and work. Baseline:HEP given; patient not participating (11/19/2022); participating regularly (12/02/2022);  Goal status: MET   LONG TERM GOALS: Target date: 08/26/22. Target date updated to 02/11/2023 for all unmet goals on 11/19/2022. Target date updated to 04/14/2023 for all unmet goals on 01/20/2023.   Patient will demonstrate improved FOTO by equal or greater than 10 by visit #17 to demonstrate improvement in overall condition and self-reported functional ability.  Baseline: initial baseline 55 (07/01/2022); 60 at visit #17 (12/26/2022); 66 at visit #30 Goal status: MET  2.  Pt will decrease worst pain as reported on NPRS by at least 3 points in order to demonstrate clinically significant reduction in pain. Baseline: 4/10 (07/01/2022): up to 10/10  (11/19/2022); up to 4-5/10 when burning two days ago, 2.5/10 when excluding the burning (12/06/2022); 3-4/10 in the last 2 weeks (01/20/2023); Pain is 1.5/10 over the past 2 weeks  Goal status: on-going  3.  Pt will demonstrate plank time of or more to demonstrate age-predicted match of core strength needed for job duties Baseline: 10sec (07/01/2022): 24 seconds (11/19/2022); 23 seconds (12/26/2022); 37 seconds (01/20/2023); 35 seconds (04/01/2023) Goal status:  In-progress  4.  Pt will demonstrate bering sorenson test of 34sec or more to demonstrate clinically significant increase in spinal extensor strength needed for job duties Baseline: 20sec (07/01/2022); 58 seconds (11/19/2022); 04/01/23: deferred  Goal status: MET  5.  Patient will be independent with initial home exercise program for self-management of symptoms. Baseline: HEP to be reviewed and updated as needed at visit 7 as appropriate (11/19/22); she has has been participating 2x a week plus PT sessions + what  is left over after PT session (12/26/2022);  Goal status: MET    PLAN:  PT FREQUENCY: 1-2x/week  PT DURATION: 6 weeks  PLANNED INTERVENTIONS: Therapeutic exercises, Therapeutic activity, Neuromuscular re-education, Balance training, Gait training, Patient/Family education, Self Care, Joint mobilization, Stair training, Dry Needling, Electrical stimulation, Spinal mobilization, Cryotherapy, Moist heat, Manual therapy, and Re-evaluation.  PLAN FOR NEXT SESSION:  update HEP as appropriate, progressive core/LE/functional strengthening, consider repeated motions assessment, education. Manual therapy / dry needling as needed.   Consider HEP update, weaning  from dry needling, progressing core and functional strengthening. Consider using weight in both hand for the kickstand dead lift to even out upper back rotation.   Matthew Saras, Student-PT   10:09 AM, 04/02/23 Theresa Ortiz, PT, DPT Physical Therapist - [place] [phone] (Office)  [facility] [address] [place], [place] [address] P: [phone] I F: [phone]

## 2023-04-03 ENCOUNTER — Encounter: Payer: 59 | Admitting: Physical Therapy

## 2023-04-03 ENCOUNTER — Other Ambulatory Visit: Payer: Self-pay

## 2023-04-03 DIAGNOSIS — H66001 Acute suppurative otitis media without spontaneous rupture of ear drum, right ear: Secondary | ICD-10-CM | POA: Diagnosis not present

## 2023-04-03 DIAGNOSIS — B9689 Other specified bacterial agents as the cause of diseases classified elsewhere: Secondary | ICD-10-CM | POA: Diagnosis not present

## 2023-04-03 DIAGNOSIS — J019 Acute sinusitis, unspecified: Secondary | ICD-10-CM | POA: Diagnosis not present

## 2023-04-03 DIAGNOSIS — J45909 Unspecified asthma, uncomplicated: Secondary | ICD-10-CM | POA: Insufficient documentation

## 2023-04-03 DIAGNOSIS — J209 Acute bronchitis, unspecified: Secondary | ICD-10-CM | POA: Diagnosis not present

## 2023-04-03 MED ORDER — HYDROCODONE BIT-HOMATROP MBR 5-1.5 MG/5ML PO SOLN
ORAL | 0 refills | Status: DC
  Filled 2023-04-03: qty 60, 10d supply, fill #0

## 2023-04-03 MED ORDER — PREDNISONE 20 MG PO TABS
ORAL_TABLET | ORAL | 0 refills | Status: DC
  Filled 2023-04-03: qty 10, 5d supply, fill #0

## 2023-04-03 MED ORDER — AZITHROMYCIN 250 MG PO TABS
ORAL_TABLET | ORAL | 0 refills | Status: DC
  Filled 2023-04-03: qty 6, 5d supply, fill #0

## 2023-04-08 ENCOUNTER — Ambulatory Visit: Payer: 59 | Admitting: Physical Therapy

## 2023-04-08 ENCOUNTER — Encounter: Payer: Self-pay | Admitting: Physical Therapy

## 2023-04-08 DIAGNOSIS — M792 Neuralgia and neuritis, unspecified: Secondary | ICD-10-CM

## 2023-04-08 DIAGNOSIS — M5459 Other low back pain: Secondary | ICD-10-CM

## 2023-04-08 DIAGNOSIS — M62838 Other muscle spasm: Secondary | ICD-10-CM | POA: Diagnosis not present

## 2023-04-08 NOTE — Therapy (Addendum)
OUTPATIENT PHYSICAL THERAPY TREATMENT  Patient Name: Theresa Ortiz MRN: [id number] DOB:23-Sep-1990, 32 y.o., female Today's Date: 04/08/2023  END OF SESSION:    PT End of Session - 04/08/23 1934     Visit Number 31    Number of Visits 35    Date for PT Re-Evaluation 04/14/23    Authorization Type [place] [employer] reporting period from 04/01/2023    Progress Note Due on Visit 30    PT Start Time 1735    PT Stop Time 1825    PT Time Calculation (min) 50 min    Activity Tolerance Patient tolerated treatment well;Patient limited by pain    Behavior During Therapy [facility] for tasks assessed/performed                   Past Medical History:  Diagnosis Date   Acute meniscal tear of knee    left knee   Allergy    Anxiety    Asthma    Atrial fibrillation (HCC)    slight   COVID-19 04/2020   Depression    Ovarian cyst    PVC (premature ventricular contraction)    Scoliosis    Shingles    Tachycardia    Past Surgical History:  Procedure Laterality Date   KNEE ARTHROSCOPY WITH LATERAL MENISECTOMY Left 09/01/2012   Procedure: LEFT KNEE ARTHROSCOPY WITH LATERAL MENISCECTOMY ;  Surgeon: Jacki Cones, MD;  Location: [facility];  Service: Orthopedics;  Laterality: Left;   MENISCUS REPAIR Right    RIGHT INDEX  FINGER TENDON REPAIR  NOV 2013   Patient Active Problem List   Diagnosis Date Noted   Palpitation 07/13/2018   Atypical chest pain 07/13/2018   Family history of heart disease 07/13/2018   Shortness of breath 07/13/2018   Sinus tachycardia 07/13/2018   Acute lateral meniscus tear of left knee 09/01/2012    PCP: Tisovec, Adelfa Koh, MD  REFERRING PROVIDER: Filomena Jungling, MD  REFERRING DIAG: 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 Pt reports she has some pain at the mid calf that is the size of packing tape that feels like a cramp. She reports she has tension at L low back and glute. LBP/glute pain aggravated by lifting/moving patients, and bending forward. Has had L sided sciatic symptoms 1x/week when lifting something heavy down post LLE. She hs previously had pain with prolonged walking, but that has subsided over the past year or so. Has had successful PT in the past with [facility], which she thinks is very helpful. Pt works full time as an Engineer, structural, enjoys traveling and walking her dog. Comorbid conditions as listed above. No falls in past 6 months. Pt denies N/V, B&B changes, unexplained weight fluctuation, saddle paresthesia, fever, night sweats, or unrelenting night pain at this time. Patient denies hx of cancer, stroke, seizures, diabetes, unexplained weight loss, unexplained changes in bowel or bladder problems, unexplained stumbling or dropping things, osteoporosis, and spinal surgery.  SUBJECTIVE:  SUBJECTIVE STATEMENT:   Patient was sick last Tuesday to Wednesday and called out of work. She felt very week and hurt her right mid-low back lifting a 6-pack of 1 gallon water jugs out of her grocery cart at [employer] into into her car. She now has pain that starts on the right side of her low back and radiates to her left. In addition, she got a sinus inflection, bronchitis, and an attack of 4 gallstones.  She just finished a course of antibiotics and prednisone.    PAIN:  NPRS: 3/10 right mid-low back, 2/10 on left side of low back, SIJ = 0.5/10 0.5/10 left buttocks (got to a 0.75/10  cleaning).  PATIENT GOALS: Get the cramp out of my calf, to "eleviate whatever pissed it off this time out of the leg"   OBJECTIVE  TODAY'S TREATMENT:    Therapeutic exercise: to centralize symptoms and improve ROM, strength, muscular endurance, and activity tolerance required for successful completion of functional activities.    Nustep 5.5 minutes level 6  Bird dogs: approx 10 per side with cone on back for tactile cueing  Manual therapy: to reduce pain and tissue tension, improve range of motion, neuromodulation, in order to promote improved ability to complete functional activities.  P-A on mid-lower sacrum: Grade III 3x30 seconds (patient felt better afterwards, like her SIJ was getting "stretched")  STM to bilateral lumbar paraspinals and left glute max/med just lateral to left SIJ.  Modality: Dry needling performed to lumbar spine and L glute med/max to decrease pain and spasms along patient's low back and left glute region with patient in prone utilizing 3 dry needle(s) .30mm x with 1 sticks at each side at L4 and L5 multifidi and 1 stick at left glute med/max just lateral to SIJ. Patient educated about the risks and benefits from therapy and verbally consents to treatment.  Dry needling performed by Luretha Murphy. [name] PT, DPT who is certified in this technique.   PATIENT EDUCATION:  Education details: Exercise purpose/form. Self management techniques. Education on diagnosis, prognosis, POC, anatomy and physiology of current condition.   Person educated: Patient Education method: Medical illustrator Education comprehension: verbalized understanding and returned demonstration  HOME EXERCISE PROGRAM: Access Code: [email or web address] URL: [email or web address] Date: 02/12/2023 Prepared by: Norton Blizzard  Exercises - Seated Pelvic Floor Elevators  - 1 x daily - 2 sets - 10 reps - Seated Quick Flick Pelvic Floor Contractions  - 1 x daily - 2 sets - 10 reps -  Seated Pelvic Floor Contraction  - 1 x daily - 2 sets - 10 reps - Supine Sciatic Nerve Glide  - 1-2 x daily - 7 x weekly - 10 reps - Bird Dog  - 3 x weekly - 2 sets - 10 reps - 5 second hold - Dead Bug  - 3 x weekly - 3 sets - 5-10 reps - Barbell Thruster  - 3 x weekly - 3 sets - 10 reps - Forearm Side Plank on Counter  - 3 x weekly - 3 sets - 30 seconds hold - Standard Plank  - 3 x weekly - 5 sets - 15 seconds hold - Seated Piriformis Stretch with Trunk Bend  - 3 x weekly - 3-5 reps - 10-30 seconds hold - Standing Anti-Rotation Press with Anchored Resistance  - 3 x weekly - 3 sets - 10 reps - 5 seconds hold  HOME EXERCISE PROGRAM [[email or web address]] View at [email or web address]" using code: [id number] KICKSTAND DEADLIFT -  Repeat 10  Repetitions, Complete 3 Sets, Perform 3 Times a Week  ASSESSMENT:  CLINICAL IMPRESSION:   Patient arrives with another flare up (this time of her right low-back) after lifting a 6 pack of 1-gallon water jugs from her grocery cart into her car.  The focus of today's session was on calming down her symptoms and pain.  Patient noted some decreased pain after CPAs to her mid-lower sacrum, as well as after dry needling. The new pain seemed to be related to tension/pain at her right lumbar paraspinals since she had some reproduction of pain there with dry needling to that region.  After needling, patient felt noted improvement in pain on right side, but left side still felt tight and crampy.  Patient would benefit from continued management of limiting condition by skilled physical therapist to address remaining impairments and functional limitations to work towards stated goals and return to PLOF or maximal functional independence.   From Re-Evaluation 11/19/2022:  Patient is a 33 y.o. 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 Patient is returning to PT more than 90 days since last PT visit to continue care for the same condition as before. She completed 5 physical therapy sessions with improvement in symptoms before her attendance lapsed. She now presents with worsening left LE symptoms and low back and left glute pain since her work environment changed. She had good response to physical therapy intervention in the past and good potential for improvement again with consistent attendance. Patient did demonstrate improvement in trunk endurance at today's goal assessment. Patient presents with significant pain, paresthesia, muscle tension, motor control, muscle performance (strength/power/endurance), and activity tolerance impairments that are limiting ability to complete her usual activities such as working, walking, prolonged standing, bending, lifting, yardwork, housework, sleeping, traveling, prolonged sitting, going to the beach, anything that requires repetitive bending or lifting without difficulty. Patient will benefit from skilled physical therapy intervention to address current body structure impairments and activity limitations to improve function and work towards goals set in current POC in order to return to prior level of function or maximal functional improvement.   OBJECTIVE IMPAIRMENTS: Abnormal gait, decreased activity tolerance, decreased balance, decreased endurance, decreased knowledge of condition, decreased mobility, difficulty walking, decreased ROM, decreased strength, increased fascial restrictions, impaired perceived functional ability, increased muscle spasms, impaired flexibility, improper body mechanics, postural dysfunction, obesity, and pain.   ACTIVITY LIMITATIONS: carrying, lifting, bending, sitting, standing, squatting, stairs, transfers, hygiene/grooming, locomotion level, and caring for others  PARTICIPATION  LIMITATIONS: meal prep, cleaning, laundry, driving, shopping, community activity, occupation, yard work, and   working, walking, prolonged standing, bending, lifting, yardwork, housework, sleeping, traveling, prolonged sitting, going to [employer], anything that requires repetitive bending or lifting   PERSONAL FACTORS: Fitness, Past/current experiences, Time since onset of injury/illness/exacerbation, and 1-2 comorbidities: lateral meniscus tear of left knee; Palpitation; Atypical chest pain; Family history of heart disease; Shortness of breath; and Sinus tachycardia on their problem list. past medical history of Acute meniscal tear of knee, Allergy, Anxiety, Asthma, Atrial fibrillation (HCC), COVID-19 (04/2020), Depression, Ovarian cyst, PVC (premature ventricular contraction), Scoliosis, Shingles, and Tachycardia. past surgical history that includes RIGHT INDEX  FINGER TENDON REPAIR (NOV 2013); Knee arthroscopy with lateral menisectomy (Left, 09/01/2012); and Meniscus repair (Right)  are also affecting patient's functional outcome.   REHAB POTENTIAL:  Good  CLINICAL DECISION MAKING: Evolving/moderate complexity  EVALUATION COMPLEXITY: Moderate   GOALS: Goals reviewed with patient? Yes  SHORT TERM GOALS: Target date: 08/07/22. Target date updated to 12/04/2022 on 11/19/2022.   Pt will be independent with initial HEP in order to improve strength and balance in order to decrease fall risk and improve function at home and work. Baseline:HEP given; patient not participating (11/19/2022); participating regularly (12/02/2022);  Goal status: MET   LONG TERM GOALS: Target date: 08/26/22. Target date updated to 02/11/2023 for all unmet goals on 11/19/2022. Target date updated to 04/14/2023 for all unmet goals on 01/20/2023.   Patient will demonstrate improved FOTO by equal or greater than 10 by visit #17 to demonstrate improvement in overall condition and self-reported functional ability.  Baseline: initial  baseline 55 (07/01/2022); 60 at visit #17 (12/26/2022); 66 at visit #30 Goal status: MET  2.  Pt will decrease worst pain as reported on NPRS by at least 3 points in order to demonstrate clinically significant reduction in pain. Baseline: 4/10 (07/01/2022): up to 10/10  (11/19/2022); up to 4-5/10 when burning two days ago, 2.5/10 when excluding the burning (12/06/2022); 3-4/10 in the last 2 weeks (01/20/2023); Pain is 1.5/10 over the past 2 weeks  Goal status: on-going  3.  Pt will demonstrate plank time of or more to demonstrate age-predicted match of core strength needed for job duties Baseline: 10sec (07/01/2022): 24 seconds (11/19/2022); 23 seconds (12/26/2022); 37 seconds (01/20/2023); 35 seconds (04/01/2023) Goal status: In-progress  4.  Pt will demonstrate bering sorenson test of 34sec or more to demonstrate clinically significant increase in spinal extensor strength needed for job duties Baseline: 20sec (07/01/2022); 58 seconds (11/19/2022); 04/01/23: deferred  Goal status: MET  5.  Patient will be independent with initial home exercise program for self-management of symptoms. Baseline: HEP to be reviewed and updated as needed at visit 7 as appropriate (11/19/22); she has has been participating 2x a week plus PT sessions + what  is left over after PT session (12/26/2022);  Goal status: MET    PLAN:  PT FREQUENCY: 1-2x/week  PT DURATION: 6 weeks  PLANNED INTERVENTIONS: Therapeutic exercises, Therapeutic activity, Neuromuscular re-education, Balance training, Gait training, Patient/Family education, Self Care, Joint mobilization, Stair training, Dry Needling, Electrical stimulation, Spinal mobilization, Cryotherapy, Moist heat, Manual therapy, and Re-evaluation.  PLAN FOR NEXT SESSION:  update HEP as appropriate, progressive core/LE/functional strengthening, consider repeated motions assessment, education. Manual therapy / dry needling as needed.     Leisel Pinette, [place], Student-PT   [place].  [name], PT, DPT 04/08/23, 7:58 PM  [facility] [address] [place], [place] [address] P: [phone] I F: [phone]

## 2023-04-15 ENCOUNTER — Ambulatory Visit: Payer: 59

## 2023-04-15 DIAGNOSIS — M62838 Other muscle spasm: Secondary | ICD-10-CM

## 2023-04-15 DIAGNOSIS — M5459 Other low back pain: Secondary | ICD-10-CM

## 2023-04-15 DIAGNOSIS — M792 Neuralgia and neuritis, unspecified: Secondary | ICD-10-CM

## 2023-04-15 NOTE — Therapy (Signed)
OUTPATIENT PHYSICAL THERAPY TREATMENT  Patient Name: Theresa Ortiz MRN: [id number] DOB:1990-12-08, 32 y.o., female Today's Date: 04/15/2023  END OF SESSION:    PT End of Session - 04/15/23 1603     Visit Number 32    Number of Visits 35    Date for PT Re-Evaluation 04/14/23    Authorization Type [place] [employer] reporting period from 04/01/2023    Authorization Time Period VL: 25 PT/OT combined, Auth req after 25th visit    Progress Note Due on Visit 40    PT Start Time 1600    PT Stop Time 1640    PT Time Calculation (min) 40 min    Activity Tolerance Patient tolerated treatment well;Patient limited by pain    Behavior During Therapy [facility] for tasks assessed/performed                   Past Medical History:  Diagnosis Date   Acute meniscal tear of knee    left knee   Allergy    Anxiety    Asthma    Atrial fibrillation (HCC)    slight   COVID-19 04/2020   Depression    Ovarian cyst    PVC (premature ventricular contraction)    Scoliosis    Shingles    Tachycardia    Past Surgical History:  Procedure Laterality Date   KNEE ARTHROSCOPY WITH LATERAL MENISECTOMY Left 09/01/2012   Procedure: LEFT KNEE ARTHROSCOPY WITH LATERAL MENISCECTOMY ;  Surgeon: Jacki Cones, MD;  Location: [facility];  Service: Orthopedics;  Laterality: Left;   MENISCUS REPAIR Right    RIGHT INDEX  FINGER TENDON REPAIR  NOV 2013   Patient Active Problem List   Diagnosis Date Noted   Palpitation 07/13/2018   Atypical chest pain 07/13/2018   Family history of heart disease 07/13/2018   Shortness of breath 07/13/2018   Sinus tachycardia 07/13/2018   Acute lateral meniscus tear of left knee 09/01/2012    PCP: Tisovec, Adelfa Koh, MD  REFERRING PROVIDER: Filomena Jungling, MD  REFERRING DIAG: 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 Pt reports she has some pain at the mid calf that is the size of packing tape that feels like a cramp. She reports she has tension at L low back and glute. LBP/glute pain aggravated by lifting/moving patients, and bending forward. Has had L sided sciatic symptoms 1x/week when lifting something heavy down post LLE. She hs previously had pain with prolonged walking, but that has subsided over the past year or so. Has had successful PT in the past with [facility], which she thinks is very helpful. Pt works full time as an Engineer, structural, enjoys traveling and walking her dog. Comorbid conditions as listed above. No falls in past 6 months. Pt denies N/V, B&B changes, unexplained weight fluctuation, saddle paresthesia, fever, night sweats, or unrelenting night pain at this time. Patient denies hx of cancer, stroke, seizures, diabetes, unexplained weight loss, unexplained changes in bowel or bladder problems, unexplained stumbling or dropping things, osteoporosis, and spinal surgery.  SUBJECTIVE:  SUBJECTIVE STATEMENT:   Patient was sick last Tuesday to Wednesday and called out of work. She felt very week and hurt her right mid-low back lifting a 6-pack of 1 gallon water jugs out of her grocery cart at [employer] into into her car. She now has pain that starts on the right side of her low back and radiates to her left. In addition, she got a sinus inflection, bronchitis, and an attack of 4 gallstones.  She just finished a course of antibiotics and prednisone.    PAIN:  NPRS: 3/10 right mid-low back, 2/10 on left  side of low back, SIJ = 0.5/10 0.5/10 left buttocks (got to a 0.75/10 cleaning).  PATIENT GOALS: Get the cramp out of my calf, to "eleviate whatever pissed it off this time out of the leg"   OBJECTIVE  TODAY'S TREATMENT 04/15/23 -Nustep AA/ROM level 6x5 minutes  -Quadruped bird dog 2-3 sets (extra focus on eliminating all passive lumbar spine/pelvis accessory movements)  -dead bug 1-2 sets (extra focus on eliminating all passive lumbar spine/pelvis accessory movements) -isometric dead bug pose 2-3x30sec in lumbar spine neutral  -standing SLS + CL hip flexion from 80 degrees to end range 2x10 bilat    PATIENT EDUCATION:  Education details: Exercise purpose/form. Self management techniques. Education on diagnosis, prognosis, POC, anatomy and physiology of current condition.   Person educated: Patient Education method: Medical illustrator Education comprehension: verbalized understanding and returned demonstration  HOME EXERCISE PROGRAM: Access Code: [email or web address] URL: [email or web address] Date: 02/12/2023 Prepared by: Norton Blizzard  Exercises - Seated Pelvic Floor Elevators  - 1 x daily - 2 sets - 10 reps - Seated Quick Flick Pelvic Floor Contractions  - 1 x daily - 2 sets - 10 reps - Seated Pelvic Floor Contraction  - 1 x daily - 2 sets - 10 reps - Supine Sciatic Nerve Glide  - 1-2 x daily - 7 x weekly - 10 reps - Bird Dog  - 3 x weekly - 2 sets - 10 reps - 5 second hold - Dead Bug  - 3 x weekly - 3 sets - 5-10 reps - Barbell Thruster  - 3 x weekly - 3 sets - 10 reps - Forearm Side Plank on Counter  - 3 x weekly - 3 sets - 30 seconds hold - Standard Plank  - 3 x weekly - 5 sets - 15 seconds hold - Seated Piriformis Stretch with Trunk Bend  - 3 x weekly - 3-5 reps - 10-30 seconds hold - Standing Anti-Rotation Press with Anchored Resistance  - 3 x weekly - 3 sets - 10 reps - 5 seconds hold  HOME EXERCISE PROGRAM [[email or web address]] View at [email or web address]" using  code: [email or web address] KICKSTAND DEADLIFT -  Repeat 10 Repetitions, Complete 3 Sets, Perform 3 Times a Week  ASSESSMENT:  CLINICAL IMPRESSION:   Shifted focus back to motor control training and strengthening. Return emphasis on motor control aspects of home program which patient had forgotten about. In many of these scenarios, pt appears to lack sufficient strength and endurance to effectively learn the motor control tasks, but we give it our all. Pt takes breaks as needed. Reprinted HEP at request, added band clam as it was missing. Extensive education on bringing balance to postural and fast twitch muscle groups. Pt conitnues to make progress toward LT goals of care overall.   From Re-Evaluation 11/19/2022:  Patient is a 32 y.o. female referred to outpatient physical therapy with a medical diagnosis of chronic bilateral lower back  pain with bilateral sciatica, thoracic spine pain who presents with signs and symptoms consistent with low back pain with left sided sciatica, possibly with piriformis syndrome. Patient is returning to PT more than 90 days since last PT visit to continue care for the same condition as before. She completed 5 physical therapy sessions with improvement in symptoms before her attendance lapsed. She now presents with worsening left LE symptoms and low back and left glute pain since her work environment changed. She had good response to physical therapy intervention in the past and good potential for improvement again with consistent attendance. Patient did demonstrate improvement in trunk endurance at today's goal assessment. Patient presents with significant pain, paresthesia, muscle tension, motor control, muscle performance (strength/power/endurance), and activity tolerance impairments that are limiting ability to complete her usual activities such as working, walking, prolonged standing, bending, lifting, yardwork, housework, sleeping, traveling, prolonged sitting, going to the beach,  anything that requires repetitive bending or lifting without difficulty. Patient will benefit from skilled physical therapy intervention to address current body structure impairments and activity limitations to improve function and work towards goals set in current POC in order to return to prior level of function or maximal functional improvement.   OBJECTIVE IMPAIRMENTS: Abnormal gait, decreased activity tolerance, decreased balance, decreased endurance, decreased knowledge of condition, decreased mobility, difficulty walking, decreased ROM, decreased strength, increased fascial restrictions, impaired perceived functional ability, increased muscle spasms, impaired flexibility, improper body mechanics, postural dysfunction, obesity, and pain.   ACTIVITY LIMITATIONS: carrying, lifting, bending, sitting, standing, squatting, stairs, transfers, hygiene/grooming, locomotion level, and caring for others  PARTICIPATION LIMITATIONS: meal prep, cleaning, laundry, driving, shopping, community activity, occupation, yard work, and   working, walking, prolonged standing, bending, lifting, yardwork, housework, sleeping, traveling, prolonged sitting, going to [employer], anything that requires repetitive bending or lifting   PERSONAL FACTORS: Fitness, Past/current experiences, Time since onset of injury/illness/exacerbation, and 1-2 comorbidities: lateral meniscus tear of left knee; Palpitation; Atypical chest pain; Family history of heart disease; Shortness of breath; and Sinus tachycardia on their problem list. past medical history of Acute meniscal tear of knee, Allergy, Anxiety, Asthma, Atrial fibrillation (HCC), COVID-19 (04/2020), Depression, Ovarian cyst, PVC (premature ventricular contraction), Scoliosis, Shingles, and Tachycardia. past surgical history that includes RIGHT INDEX  FINGER TENDON REPAIR (NOV 2013); Knee arthroscopy with lateral menisectomy (Left, 09/01/2012); and Meniscus repair (Right)  are also  affecting patient's functional outcome.   REHAB POTENTIAL: Good  CLINICAL DECISION MAKING: Evolving/moderate complexity  EVALUATION COMPLEXITY: Moderate   GOALS: Goals reviewed with patient? Yes  SHORT TERM GOALS: Target date: 08/07/22. Target date updated to 12/04/2022 on 11/19/2022.   Pt will be independent with initial HEP in order to improve strength and balance in order to decrease fall risk and improve function at home and work. Baseline:HEP given; patient not participating (11/19/2022); participating regularly (12/02/2022);  Goal status: MET   LONG TERM GOALS: Target date: 08/26/22. Target date updated to 02/11/2023 for all unmet goals on 11/19/2022. Target date updated to 04/14/2023 for all unmet goals on 01/20/2023.   Patient will demonstrate improved FOTO by equal or greater than 10 by visit #17 to demonstrate improvement in overall condition and self-reported functional ability.  Baseline: initial baseline 55 (07/01/2022); 60 at visit #17 (12/26/2022); 66 at visit #30 Goal status: MET  2.  Pt will decrease worst pain as reported on NPRS by at least 3 points in order to demonstrate clinically significant reduction in pain. Baseline: 4/10 (07/01/2022): up to 10/10  (11/19/2022); up  to 4-5/10 when burning two days ago, 2.5/10 when excluding the burning (12/06/2022); 3-4/10 in the last 2 weeks (01/20/2023); Pain is 1.5/10 over the past 2 weeks  Goal status: on-going  3.  Pt will demonstrate plank time of or more to demonstrate age-predicted match of core strength needed for job duties Baseline: 10sec (07/01/2022): 24 seconds (11/19/2022); 23 seconds (12/26/2022); 37 seconds (01/20/2023); 35 seconds (04/01/2023) Goal status: In-progress  4.  Pt will demonstrate bering sorenson test of 34sec or more to demonstrate clinically significant increase in spinal extensor strength needed for job duties Baseline: 20sec (07/01/2022); 58 seconds (11/19/2022); 04/01/23: deferred  Goal status: MET  5.  Patient  will be independent with initial home exercise program for self-management of symptoms. Baseline: HEP to be reviewed and updated as needed at visit 7 as appropriate (11/19/22); she has has been participating 2x a week plus PT sessions + what  is left over after PT session (12/26/2022);  Goal status: MET    PLAN:  PT FREQUENCY: 1-2x/week  PT DURATION: 6 weeks  PLANNED INTERVENTIONS: Therapeutic exercises, Therapeutic activity, Neuromuscular re-education, Balance training, Gait training, Patient/Family education, Self Care, Joint mobilization, Stair training, Dry Needling, Electrical stimulation, Spinal mobilization, Cryotherapy, Moist heat, Manual therapy, and Re-evaluation.  PLAN FOR NEXT SESSION:  abdominal motor development, continues with core strength and stabilization   4:16 PM, 04/15/23 Rosamaria Lints, PT, DPT Physical Therapist - [place] [phone] (Office)    [facility] [address] [place], [place] [address] P: [phone] I F: [phone]

## 2023-04-18 ENCOUNTER — Telehealth: Payer: Self-pay

## 2023-04-18 NOTE — Telephone Encounter (Signed)
Ordering provider: Irene Limbo Associated diagnoses: Palpitations WatchPAT PA obtained on 04/18/2023 by Ashley Mariner, LPN.  Authorization: Yes; tracking ID No precert required Patient notified of PIN (1234) on 04/18/2023 via Notification Method: MyChart message.

## 2023-04-24 ENCOUNTER — Encounter: Payer: 59 | Admitting: Physical Therapy

## 2023-04-28 ENCOUNTER — Ambulatory Visit
Admission: RE | Admit: 2023-04-28 | Discharge: 2023-04-28 | Disposition: A | Discharge status: Home / Self Care | Payer: 59 | Source: Ambulatory Visit | Attending: Gastroenterology | Admitting: Gastroenterology

## 2023-04-28 ENCOUNTER — Ambulatory Visit: Payer: 59 | Admitting: Physical Therapy

## 2023-04-28 ENCOUNTER — Other Ambulatory Visit: Payer: Self-pay

## 2023-04-28 ENCOUNTER — Other Ambulatory Visit
Admission: RE | Admit: 2023-04-28 | Discharge: 2023-04-28 | Disposition: A | Discharge status: Home / Self Care | Payer: 59 | Source: Ambulatory Visit | Attending: Gastroenterology | Admitting: Gastroenterology

## 2023-04-28 DIAGNOSIS — K8020 Calculus of gallbladder without cholecystitis without obstruction: Secondary | ICD-10-CM

## 2023-04-28 DIAGNOSIS — R17 Unspecified jaundice: Secondary | ICD-10-CM

## 2023-04-28 DIAGNOSIS — R1011 Right upper quadrant pain: Secondary | ICD-10-CM | POA: Diagnosis not present

## 2023-04-28 LAB — CBC WITH DIFFERENTIAL/PLATELET
Abs Immature Granulocytes: 0.04 10*3/uL (ref 0.00–0.07)
Basophils Absolute: 0 10*3/uL (ref 0.0–0.1)
Basophils Relative: 0 %
Eosinophils Absolute: 0.1 10*3/uL (ref 0.0–0.5)
Eosinophils Relative: 1 %
HCT: 41.5 % (ref 36.0–46.0)
Hemoglobin: 13.8 g/dL (ref 12.0–15.0)
Immature Granulocytes: 0 %
Lymphocytes Relative: 23 %
Lymphs Abs: 2.1 10*3/uL (ref 0.7–4.0)
MCH: 28 pg (ref 26.0–34.0)
MCHC: 33.3 g/dL (ref 30.0–36.0)
MCV: 84.3 fL (ref 80.0–100.0)
Monocytes Absolute: 0.5 10*3/uL (ref 0.1–1.0)
Monocytes Relative: 5 %
Neutro Abs: 6.5 10*3/uL (ref 1.7–7.7)
Neutrophils Relative %: 71 %
Platelets: 250 10*3/uL (ref 150–400)
RBC: 4.92 MIL/uL (ref 3.87–5.11)
RDW: 13.7 % (ref 11.5–15.5)
WBC: 9.2 10*3/uL (ref 4.0–10.5)
nRBC: 0 % (ref 0.0–0.2)

## 2023-04-28 LAB — HEPATIC FUNCTION PANEL
ALT: 562 U/L — ABNORMAL HIGH (ref 0–44)
AST: 328 U/L — ABNORMAL HIGH (ref 15–41)
Albumin: 4.3 g/dL (ref 3.5–5.0)
Alkaline Phosphatase: 198 U/L — ABNORMAL HIGH (ref 38–126)
Bilirubin, Direct: 0.2 mg/dL (ref 0.0–0.2)
Indirect Bilirubin: 0.9 mg/dL (ref 0.3–0.9)
Total Bilirubin: 1.1 mg/dL (ref 0.0–1.2)
Total Protein: 7.5 g/dL (ref 6.5–8.1)

## 2023-04-28 NOTE — Addendum Note (Signed)
Addended by: Roena Malady on: 04/28/2023 01:50 PM   Modules accepted: Orders

## 2023-04-28 NOTE — Addendum Note (Signed)
Addended by: Roena Malady on: 04/28/2023 02:45 PM   Modules accepted: Orders

## 2023-05-01 ENCOUNTER — Other Ambulatory Visit: Payer: Self-pay

## 2023-05-01 MED ORDER — AMPHETAMINE-DEXTROAMPHET ER 20 MG PO CP24
20.0000 mg | ORAL_CAPSULE | Freq: Every day | ORAL | 0 refills | Status: DC
  Filled 2023-05-01: qty 30, 30d supply, fill #0

## 2023-05-02 ENCOUNTER — Other Ambulatory Visit: Payer: Self-pay

## 2023-05-05 ENCOUNTER — Other Ambulatory Visit: Payer: Self-pay

## 2023-05-05 ENCOUNTER — Encounter: Payer: Self-pay | Admitting: Gastroenterology

## 2023-05-05 ENCOUNTER — Other Ambulatory Visit
Admission: RE | Admit: 2023-05-05 | Discharge: 2023-05-05 | Disposition: A | Discharge status: Home / Self Care | Payer: 59 | Attending: Gastroenterology | Admitting: Gastroenterology

## 2023-05-05 DIAGNOSIS — R748 Abnormal levels of other serum enzymes: Secondary | ICD-10-CM

## 2023-05-05 LAB — HEPATIC FUNCTION PANEL
ALT: 157 U/L — ABNORMAL HIGH (ref 0–44)
AST: 52 U/L — ABNORMAL HIGH (ref 15–41)
Albumin: 4.2 g/dL (ref 3.5–5.0)
Alkaline Phosphatase: 164 U/L — ABNORMAL HIGH (ref 38–126)
Bilirubin, Direct: <0.1 mg/dL (ref 0.0–0.2)
Total Bilirubin: 0.6 mg/dL (ref 0.0–1.2)
Total Protein: 7.7 g/dL (ref 6.5–8.1)

## 2023-05-06 ENCOUNTER — Ambulatory Visit: Payer: Commercial Managed Care - PPO | Attending: Physical Medicine & Rehabilitation | Admitting: Physical Therapy

## 2023-05-06 ENCOUNTER — Encounter: Payer: Self-pay | Admitting: Physical Therapy

## 2023-05-06 DIAGNOSIS — M792 Neuralgia and neuritis, unspecified: Secondary | ICD-10-CM | POA: Diagnosis not present

## 2023-05-06 DIAGNOSIS — M62838 Other muscle spasm: Secondary | ICD-10-CM | POA: Insufficient documentation

## 2023-05-06 DIAGNOSIS — M5459 Other low back pain: Secondary | ICD-10-CM | POA: Diagnosis not present

## 2023-05-06 NOTE — Therapy (Signed)
 OUTPATIENT PHYSICAL THERAPY TREATMENT  Patient Name: Theresa Ortiz MRN: [id number] DOB:20-Dec-1990, 33 y.o., female Today's Date: 05/06/2023  END OF SESSION:    PT End of Session - 05/06/23 1820     Visit Number 33    Number of Visits 35    Date for PT Re-Evaluation 04/14/23    Authorization Type [place] [employer] reporting period from 04/01/2023    Authorization Time Period VL: 25 PT/OT combined, Auth req after 25th visit    Progress Note Due on Visit 40    PT Start Time 1820    PT Stop Time 1900    PT Time Calculation (min) 40 min    Activity Tolerance Patient tolerated treatment well;Patient limited by pain    Behavior During Therapy [facility] for tasks assessed/performed                    Past Medical History:  Diagnosis Date   Acute meniscal tear of knee    left knee   Allergy    Anxiety    Asthma    Atrial fibrillation (HCC)    slight   COVID-19 04/2020   Depression    Ovarian cyst    PVC (premature ventricular contraction)    Scoliosis    Shingles    Tachycardia    Past Surgical History:  Procedure Laterality Date   KNEE ARTHROSCOPY WITH LATERAL MENISECTOMY Left 09/01/2012   Procedure: LEFT KNEE ARTHROSCOPY WITH LATERAL MENISCECTOMY ;  Surgeon: Tanda DELENA Heading, MD;  Location: [facility];  Service: Orthopedics;  Laterality: Left;   MENISCUS REPAIR Right    RIGHT INDEX  FINGER TENDON REPAIR  NOV 2013   Patient Active Problem List   Diagnosis Date Noted   Palpitation 07/13/2018   Atypical chest pain 07/13/2018   Family history of heart disease 07/13/2018   Shortness of breath 07/13/2018   Sinus tachycardia 07/13/2018   Acute lateral meniscus tear of left knee 09/01/2012    PCP: Tisovec, Charlie ORN, MD  REFERRING PROVIDER: Delon Gins, MD  REFERRING DIAG: 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 Pt reports she has some pain at the mid calf that is the size of packing tape that feels like a cramp. She reports she has tension at L low back and glute. LBP/glute pain aggravated by lifting/moving patients, and bending forward. Has had L sided sciatic symptoms 1x/week when lifting something heavy down post LLE. She hs previously had pain with prolonged walking, but that has subsided over the past year or so. Has had successful PT in the past with [facility], which she thinks is very helpful. Pt works full time as an Engineer, structural, enjoys traveling and walking her dog. Comorbid conditions as listed above. No falls in past 6 months. Pt denies N/V, B&B changes, unexplained weight fluctuation, saddle paresthesia, fever, night sweats, or unrelenting night pain at this time. Patient denies hx of cancer, stroke, seizures, diabetes, unexplained weight loss, unexplained changes in bowel or bladder problems, unexplained stumbling or dropping things, osteoporosis, and spinal surgery.  SUBJECTIVE:  SUBJECTIVE STATEMENT:   Patient states today she started getting little pinching on the left side when she was sitting or going to sit or side stepping. This is the new pain she started having after the splits incident. The older pain she was dealing with has mostly gone away. She thought it was going to act up, but it didn't get worse. Her hip joints started feeling really sore inside the hips. She went to [employer] and [employer] yesterday and she started to feel really tired there. When she went to move a patient today she  had pain wrapping around her left ankle for about 30 minutes. She has been working but has not been able to do much at home over the last few weeks. She has been dealing with gallbladder attacks and her doctor's thought she would need emergency surgery last Monday. This is the longest she has been dealing with gallbladder attack. She states last PT session she felt so bloated that she could not do anything when working on the core. She has not been doing her HEP since christmas because of the gallbladder attack.   PAIN:  NPRS: 2/10 in B hip/SIJ region.   PATIENT GOALS: Get the cramp out of my calf, to eleviate whatever pissed it off this time out of the leg   OBJECTIVE  TODAY'S TREATMENT   Therapeutic exercise: to centralize symptoms and improve ROM, strength, muscular endurance, and activity tolerance required for successful completion of functional activities.  NuStep level 7 using bilateral upper and lower extremities. Seat/handle setting 9/9. For improved extremity mobility, muscular endurance, and activity tolerance; and to induce the analgesic effect of aerobic exercise, stimulate improved joint nutrition, and prepare body structures and systems for following interventions. x 6 minutes. Average SPM = 67.  Quadruped bird dog, 3x10 each side  Quadruped hip hikes with BlackTB around distal thighs, one knee on 4 inch yoga block. 1x10 each side. Slow eccentric phase.   Lock Clam, 1x10, 1x20 each side  Education on HEP including handout   Pt required multimodal cuing for proper technique and to facilitate improved neuromuscular control, strength, range of motion, and functional ability resulting in improved performance and form.   PATIENT EDUCATION:  Education details: Exercise purpose/form. Self management techniques.  Person educated: Patient Education method: Medical Illustrator Education comprehension: verbalized understanding and returned demonstration  HOME EXERCISE  PROGRAM: Access Code: [email or web address] URL: [email or web address] Date: 05/06/2023 Prepared by: Camie Cleverly  Exercises - Seated Pelvic Floor Elevators  - 1 x daily - 2 sets - 10 reps - Seated Quick Flick Pelvic Floor Contractions  - 1 x daily - 2 sets - 10 reps - Seated Pelvic Floor Contraction  - 1 x daily - 2 sets - 10 reps - Supine Sciatic Nerve Glide  - 1-2 x daily - 7 x weekly - 10 reps - Bird Dog  - 3 x weekly - 2 sets - 10 reps - 5 second hold - Dead Bug  - 3 x weekly - 3 sets - 5-10 reps - Barbell Thruster  - 3 x weekly - 3 sets - 10 reps - Forearm Side Plank on Counter  - 3 x weekly - 3 sets - 30 seconds hold - Standard Plank  - 3 x weekly - 5 sets - 15 seconds hold - Seated Piriformis Stretch with Trunk Bend  - 3 x weekly - 3-5 reps - 10-30 seconds hold - Standing Anti-Rotation Press with Anchored Resistance  - 3 x weekly -  3 sets - 10 reps - 5 seconds hold  HOME EXERCISE PROGRAM [[email or web address]] View at [email or web address] using code: [email or web address] KICKSTAND DEADLIFT -  Repeat 10 Repetitions, Complete 3 Sets, Perform 3 Times a Week  HOME EXERCISE PROGRAM [[email or web address]] View at [email or web address] using code: [email or web address] LOCK CLAMS -  Repeat 20 Repetitions, Hold 1 Second(s), Complete 2 Sets, Perform 3 Times a Day  ASSESSMENT:  CLINICAL IMPRESSION:   Patient arrives with improved pain but poor HEP participation after being sick with a gallbladder attack. Today's session re-introduced exercises from prior session and introduced new exercises that further isolated the hip external rotators and glutes while requiring improved core/trunk stability and motor control. Patient struggled with form and motor control but was appropriately challenged without increased pain. She reported feeling the same except with hip fatigue by end of session. Patient is to start back to her HEP, gradually increasing exercise volume to improve tolerance. Plan to continue with exercises for motor control,  stability, and muscular endurance as tolerated. Patient would benefit from continued management of limiting condition by skilled physical therapist to address remaining impairments and functional limitations to work towards stated goals and return to PLOF or maximal functional independence.  From Re-Evaluation 11/19/2022:  Patient is a 33 y.o. 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 Patient is returning to PT more than 90 days since last PT visit to continue care for the same condition as before. She completed 5 physical therapy sessions with improvement in symptoms before her attendance lapsed. She now presents with worsening left LE symptoms and low back and left glute pain since her work environment changed. She had good response to physical therapy intervention in the past and good potential for improvement again with consistent attendance. Patient did demonstrate improvement in trunk endurance at today's goal assessment. Patient presents with significant pain, paresthesia, muscle tension, motor control, muscle performance (strength/power/endurance), and activity tolerance impairments that are limiting ability to complete her usual activities such as working, walking, prolonged standing, bending, lifting, yardwork, housework, sleeping, traveling, prolonged sitting, going to the beach, anything that requires repetitive bending or lifting without difficulty. Patient will benefit from skilled physical therapy intervention to address current body structure impairments and activity limitations to improve function and work towards goals set in current POC in order to return to prior level of function or maximal functional improvement.   OBJECTIVE IMPAIRMENTS: Abnormal gait,  decreased activity tolerance, decreased balance, decreased endurance, decreased knowledge of condition, decreased mobility, difficulty walking, decreased ROM, decreased strength, increased fascial restrictions, impaired perceived functional ability, increased muscle spasms, impaired flexibility, improper body mechanics, postural dysfunction, obesity, and pain.   ACTIVITY LIMITATIONS: carrying, lifting, bending, sitting, standing, squatting, stairs, transfers, hygiene/grooming, locomotion level, and caring for others  PARTICIPATION LIMITATIONS: meal prep, cleaning, laundry, driving, shopping, community activity, occupation, yard work, and   working, walking, prolonged standing, bending, lifting, yardwork, housework, sleeping, traveling, prolonged sitting, going to [employer], anything that requires repetitive bending or lifting   PERSONAL FACTORS: Fitness, Past/current experiences, Time since onset of injury/illness/exacerbation, and 1-2 comorbidities: lateral meniscus tear of left knee; Palpitation; Atypical chest pain; Family history of heart disease; Shortness of breath; and Sinus tachycardia on their problem list. past medical history of Acute meniscal tear of knee, Allergy, Anxiety, Asthma, Atrial fibrillation (HCC), COVID-19 (04/2020), Depression,  Ovarian cyst, PVC (premature ventricular contraction), Scoliosis, Shingles, and Tachycardia. past surgical history that includes RIGHT INDEX  FINGER TENDON REPAIR (NOV 2013); Knee arthroscopy with lateral menisectomy (Left, 09/01/2012); and Meniscus repair (Right)  are also affecting patient's functional outcome.   REHAB POTENTIAL: Good  CLINICAL DECISION MAKING: Evolving/moderate complexity  EVALUATION COMPLEXITY: Moderate   GOALS: Goals reviewed with patient? Yes  SHORT TERM GOALS: Target date: 08/07/22. Target date updated to 12/04/2022 on 11/19/2022.   Pt will be independent with initial HEP in order to improve strength and balance in order to  decrease fall risk and improve function at home and work. Baseline:HEP given; patient not participating (11/19/2022); participating regularly (12/02/2022);  Goal status: MET   LONG TERM GOALS: Target date: 08/26/22. Target date updated to 02/11/2023 for all unmet goals on 11/19/2022. Target date updated to 04/14/2023 for all unmet goals on 01/20/2023.   Patient will demonstrate improved FOTO by equal or greater than 10 by visit #17 to demonstrate improvement in overall condition and self-reported functional ability.  Baseline: initial baseline 55 (07/01/2022); 60 at visit #17 (12/26/2022); 66 at visit #30 Goal status: MET  2.  Pt will decrease worst pain as reported on NPRS by at least 3 points in order to demonstrate clinically significant reduction in pain. Baseline: 4/10 (07/01/2022): up to 10/10  (11/19/2022); up to 4-5/10 when burning two days ago, 2.5/10 when excluding the burning (12/06/2022); 3-4/10 in the last 2 weeks (01/20/2023); Pain is 1.5/10 over the past 2 weeks  Goal status: on-going  3.  Pt will demonstrate plank time of or more to demonstrate age-predicted match of core strength needed for job duties Baseline: 10sec (07/01/2022): 24 seconds (11/19/2022); 23 seconds (12/26/2022); 37 seconds (01/20/2023); 35 seconds (04/01/2023) Goal status: In-progress  4.  Pt will demonstrate bering sorenson test of 34sec or more to demonstrate clinically significant increase in spinal extensor strength needed for job duties Baseline: 20sec (07/01/2022); 58 seconds (11/19/2022); 04/01/23: deferred  Goal status: MET  5.  Patient will be independent with initial home exercise program for self-management of symptoms. Baseline: HEP to be reviewed and updated as needed at visit 7 as appropriate (11/19/22); she has has been participating 2x a week plus PT sessions + what  is left over after PT session (12/26/2022);  Goal status: MET    PLAN:  PT FREQUENCY: 1-2x/week  PT DURATION: 6 weeks  PLANNED  INTERVENTIONS: Therapeutic exercises, Therapeutic activity, Neuromuscular re-education, Balance training, Gait training, Patient/Family education, Self Care, Joint mobilization, Stair training, Dry Needling, Electrical stimulation, Spinal mobilization, Cryotherapy, Moist heat, Manual therapy, and Re-evaluation.  PLAN FOR NEXT SESSION:  abdominal motor development, continues with core strength and stabilization   Camie R. Juli, PT, DPT 05/06/23, 7:59 PM  [facility] [address] [place], [place] [address] P: [phone] I F: [phone]

## 2023-05-14 ENCOUNTER — Other Ambulatory Visit: Payer: Self-pay

## 2023-05-20 ENCOUNTER — Encounter: Payer: Self-pay | Admitting: Physical Therapy

## 2023-05-20 ENCOUNTER — Ambulatory Visit: Payer: Commercial Managed Care - PPO | Admitting: Physical Therapy

## 2023-05-20 DIAGNOSIS — M62838 Other muscle spasm: Secondary | ICD-10-CM | POA: Diagnosis not present

## 2023-05-20 DIAGNOSIS — M5459 Other low back pain: Secondary | ICD-10-CM

## 2023-05-20 DIAGNOSIS — M792 Neuralgia and neuritis, unspecified: Secondary | ICD-10-CM | POA: Diagnosis not present

## 2023-05-20 NOTE — Therapy (Signed)
OUTPATIENT PHYSICAL THERAPY TREATMENT  Patient Name: Theresa Ortiz MRN: [id number] DOB:1991/04/19, 33 y.o., female Today's Date: 05/20/2023  END OF SESSION:    PT End of Session - 05/20/23 1912     Visit Number 34    Number of Visits 35    Date for PT Re-Evaluation 06/16/23    Authorization Type [employer] reporting period from 04/01/2023    Authorization Time Period --    Progress Note Due on Visit 40    PT Start Time 1735    PT Stop Time 1815    PT Time Calculation (min) 40 min    Activity Tolerance Patient tolerated treatment well    Behavior During Therapy [facility] for tasks assessed/performed                     Past Medical History:  Diagnosis Date   Acute meniscal tear of knee    left knee   Allergy    Anxiety    Asthma    Atrial fibrillation (HCC)    slight   COVID-19 04/2020   Depression    Ovarian cyst    PVC (premature ventricular contraction)    Scoliosis    Shingles    Tachycardia    Past Surgical History:  Procedure Laterality Date   KNEE ARTHROSCOPY WITH LATERAL MENISECTOMY Left 09/01/2012   Procedure: LEFT KNEE ARTHROSCOPY WITH LATERAL MENISCECTOMY ;  Surgeon: Jacki Cones, MD;  Location: [facility] [place];  Service: Orthopedics;  Laterality: Left;   MENISCUS REPAIR Right    RIGHT INDEX  FINGER TENDON REPAIR  NOV 2013   Patient Active Problem List   Diagnosis Date Noted   Palpitation 07/13/2018   Atypical chest pain 07/13/2018   Family history of heart disease 07/13/2018   Shortness of breath 07/13/2018   Sinus tachycardia 07/13/2018   Acute lateral meniscus tear of left knee 09/01/2012    PCP: Tisovec, Adelfa Koh, MD  REFERRING PROVIDER: Filomena Jungling, MD  REFERRING DIAG: 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 Pt reports she has some pain at the mid calf that is the size of packing tape that feels like a cramp. She reports she has tension at L low back and glute. LBP/glute pain aggravated by lifting/moving patients, and bending forward. Has had L sided sciatic symptoms 1x/week when lifting something heavy down post LLE. She hs previously had pain with prolonged walking, but that has subsided over the past year or so. Has had successful PT in the past with [facility], which she thinks is very helpful. Pt works full time as an Engineer, structural, enjoys traveling and walking her dog. Comorbid conditions as listed above. No falls in past 6 months. Pt denies N/V, B&B changes, unexplained weight fluctuation, saddle paresthesia, fever, night sweats, or unrelenting night pain at this time. Patient denies hx of cancer, stroke, seizures, diabetes, unexplained weight loss, unexplained changes in bowel or bladder problems, unexplained stumbling or dropping things, osteoporosis, and spinal surgery.  SUBJECTIVE:  SUBJECTIVE STATEMENT:   Patient states states that her left glute pain is more sensitive when she sits too long or lays wrong. She does get pain down her lateral and posterior thigh. When she is sitting she has a feeling of compression or pinched nerve at her left medial and anterior ankle. This is better when she is standing or laying. She does not feel like the left glute and left ankle pain is related. This started for no apparent reason. The pain in her foot has been there on and off for over a week and the left thigh pain was the last 3 days. Today she had a sudden shooting pain in her  left thigh when getting up from sitting. She has been doing her HEP and was surprised her knees did not seem as locked up when she did a squat.   PAIN:  NPRS: 3/10 in L hip/SIJ region, R ankle 3/10, lower back 1.5/10.   PATIENT GOALS: Get the cramp out of my calf, to "eleviate whatever pissed it off this time out of the leg"   OBJECTIVE  Straight leg raise test:  L negative:  R positive for foot and posterior thigh symptoms.   Stairs: ascent with step over step, descent step to  with R LE leading as a preference.   TODAY'S TREATMENT   Therapeutic exercise: to centralize symptoms and improve ROM, strength, muscular endurance, and activity tolerance required for successful completion of functional activities.   Standing AROM flexion of lumbar spine, periodically throughout session to help determine effectiveness of other interventions.   Supine straight leg raise test (see above).   Prone press up, 1x10 (increasing pain in left glute, no worse).   Up/down 4 steps twice throughout session to assess effectiveness of other interventions.   Lateral shift correction (right side of body to wall), 1x10 ( feels stretch in right hip during, possibly improved LE symptoms afterwards)  Lock Clam, 1x5 each side  Side plank with clam shell, 1x15 each side (more difficult on left side after completing right side)  Quadruped hip hikes with BlackTB around distal thighs, one knee on 4 inch yoga block. 1x15 each side. Slow tempo. (Difficult)  Education on HEP including handout (replace clams with quadruped hip hikes)  Pt required multimodal cuing for proper technique and to facilitate improved neuromuscular control, strength, range of motion, and functional ability resulting in improved performance and form.   PATIENT EDUCATION:  Education details: Exercise purpose/form. Self management techniques.  Person educated: Patient Education method: Medical illustrator Education  comprehension: verbalized understanding and returned demonstration  HOME EXERCISE PROGRAM: Access Code: [email or web address] URL: [email or web address] Date: 05/20/2023 Prepared by: Norton Blizzard  Exercises - Seated Pelvic Floor Elevators  - 1 x daily - 2 sets - 10 reps - Seated Quick Flick Pelvic Floor Contractions  - 1 x daily - 2 sets - 10 reps - Seated Pelvic Floor Contraction  - 1 x daily - 2 sets - 10 reps - Supine Sciatic Nerve Glide  - 1-2 x daily - 7 x weekly - 10 reps - Bird Dog  - 3 x weekly - 2 sets - 10 reps - 5 second hold - Dead Bug  - 3 x weekly - 3 sets - 5-10 reps - Barbell Thruster  - 3 x weekly - 3 sets - 10 reps - Forearm Side Plank on Counter  - 3 x weekly - 3 sets - 30 seconds hold - Standard Plank  - 3 x weekly -  5 sets - 15 seconds hold - Seated Piriformis Stretch with Trunk Bend  - 3 x weekly - 3-5 reps - 10-30 seconds hold - Standing Anti-Rotation Press with Anchored Resistance  - 3 x weekly - 3 sets - 10 reps - 5 seconds hold - Quadruped Hip Hike on Foam  - 3 x weekly - 2-3 sets - 15 reps  HOME EXERCISE PROGRAM [[email or web address]] View at "[email or web address]" using code: [email or web address] KICKSTAND DEADLIFT -  Repeat 10 Repetitions, Complete 3 Sets, Perform 3 Times a Week  HOME EXERCISE PROGRAM [[email or web address]] View at "[email or web address]" using code: [email or web address] LOCK CLAMS -  Repeat 20 Repetitions, Hold 1 Second(s), Complete 2 Sets, Perform 3 Times a Day  ASSESSMENT:  CLINICAL IMPRESSION:   Patient arrives with improved overall movement and pain but with increased left sided LE symptoms over the last few days and week. SLR test positive for neural irritation causing concordant pain in the sciatic nerve distribution on the left. Repeated motions explored with extension and left lateral glide utilized, but difficult to determine if it is making a significant difference. Lateral glide did seem to be less irritating than prone press up. Stabilization and strengthening exercises  reviewed and updated with good tolerance. Patient continues to fatigue quickly but was able to tolerate side plank with clamshell and more reps on quadruped hip hike. Quadruped hip hike was added to HEP in place of Lock Clams to progress hip strengthening. Patient would benefit from continued management of limiting condition by skilled physical therapist to address remaining impairments and functional limitations to work towards stated goals and return to PLOF or maximal functional independence.   From Re-Evaluation 11/19/2022:  Patient is a 33 y.o. 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 Patient is returning to PT more than 90 days since last PT visit to continue care for the same condition as before. She completed 5 physical therapy sessions with improvement in symptoms before her attendance lapsed. She now presents with worsening left LE symptoms and low back and left glute pain since her work environment changed. She had good response to physical therapy intervention in the past and good potential for improvement again with consistent attendance. Patient did demonstrate improvement in trunk endurance at today's goal assessment. Patient presents with significant pain, paresthesia, muscle tension, motor control, muscle performance (strength/power/endurance), and activity tolerance impairments that are limiting ability to complete her usual activities such as working, walking, prolonged standing, bending, lifting, yardwork, housework, sleeping, traveling, prolonged sitting, going to the beach, anything that requires repetitive bending or lifting without difficulty. Patient will benefit from skilled physical therapy intervention to address current body structure impairments  and activity limitations to improve function and work towards goals set in current POC in order to return to prior level of function or maximal functional improvement.   OBJECTIVE IMPAIRMENTS: Abnormal gait, decreased activity tolerance, decreased balance, decreased endurance, decreased knowledge of condition, decreased mobility, difficulty walking, decreased ROM, decreased strength, increased fascial restrictions, impaired perceived functional ability, increased muscle spasms, impaired flexibility, improper body mechanics, postural dysfunction, obesity, and pain.   ACTIVITY LIMITATIONS: carrying, lifting, bending, sitting, standing, squatting, stairs, transfers, hygiene/grooming, locomotion level, and caring for others  PARTICIPATION LIMITATIONS: meal prep, cleaning, laundry, driving, shopping, community activity, occupation, yard work, and   working, walking, prolonged standing, bending, lifting,  yardwork, housework, sleeping, traveling, prolonged sitting, going to the beach, anything that requires repetitive bending or lifting   PERSONAL FACTORS: Fitness, Past/current experiences, Time since onset of injury/illness/exacerbation, and 1-2 comorbidities: lateral meniscus tear of left knee; Palpitation; Atypical chest pain; Family history of heart disease; Shortness of breath; and Sinus tachycardia on their problem list. past medical history of Acute meniscal tear of knee, Allergy, Anxiety, Asthma, Atrial fibrillation (HCC), COVID-19 (04/2020), Depression, Ovarian cyst, PVC (premature ventricular contraction), Scoliosis, Shingles, and Tachycardia. past surgical history that includes RIGHT INDEX  FINGER TENDON REPAIR (NOV 2013); Knee arthroscopy with lateral menisectomy (Left, 09/01/2012); and Meniscus repair (Right)  are also affecting patient's functional outcome.   REHAB POTENTIAL: Good  CLINICAL DECISION MAKING: Evolving/moderate complexity  EVALUATION COMPLEXITY: Moderate   GOALS: Goals reviewed  with patient? Yes  SHORT TERM GOALS: Target date: 08/07/22. Target date updated to 12/04/2022 on 11/19/2022.   Pt will be independent with initial HEP in order to improve strength and balance in order to decrease fall risk and improve function at home and work. Baseline:HEP given; patient not participating (11/19/2022); participating regularly (12/02/2022);  Goal status: MET   LONG TERM GOALS: Target date: 08/26/22. Target date updated to 02/11/2023 for all unmet goals on 11/19/2022. Target date updated to 04/14/2023 for all unmet goals on 01/20/2023.   Patient will demonstrate improved FOTO by equal or greater than 10 by visit #17 to demonstrate improvement in overall condition and self-reported functional ability.  Baseline: initial baseline 55 (07/01/2022); 60 at visit #17 (12/26/2022); 66 at visit #30 Goal status: MET  2.  Pt will decrease worst pain as reported on NPRS by at least 3 points in order to demonstrate clinically significant reduction in pain. Baseline: 4/10 (07/01/2022): up to 10/10  (11/19/2022); up to 4-5/10 when burning two days ago, 2.5/10 when excluding the burning (12/06/2022); 3-4/10 in the last 2 weeks (01/20/2023); Pain is 1.5/10 over the past 2 weeks  Goal status: on-going  3.  Pt will demonstrate plank time of or more to demonstrate age-predicted match of core strength needed for job duties Baseline: 10sec (07/01/2022): 24 seconds (11/19/2022); 23 seconds (12/26/2022); 37 seconds (01/20/2023); 35 seconds (04/01/2023) Goal status: In-progress  4.  Pt will demonstrate bering sorenson test of 34sec or more to demonstrate clinically significant increase in spinal extensor strength needed for job duties Baseline: 20sec (07/01/2022); 58 seconds (11/19/2022); 04/01/23: deferred  Goal status: MET  5.  Patient will be independent with initial home exercise program for self-management of symptoms. Baseline: HEP to be reviewed and updated as needed at visit 7 as appropriate (11/19/22); she has  has been participating 2x a week plus PT sessions + what  is left over after PT session (12/26/2022);  Goal status: MET    PLAN:  PT FREQUENCY: 1-2x/week  PT DURATION: 6 weeks  PLANNED INTERVENTIONS: Therapeutic exercises, Therapeutic activity, Neuromuscular re-education, Balance training, Gait training, Patient/Family education, Self Care, Joint mobilization, Stair training, Dry Needling, Electrical stimulation, Spinal mobilization, Cryotherapy, Moist heat, Manual therapy, and Re-evaluation.  PLAN FOR NEXT SESSION:  abdominal motor development and hip strength, continues with core strength and stabilization   Huntley Dec R. [name], PT, DPT 05/20/23, 7:15 PM  [facility] [address] [place], [place] [address] P: [phone] I F: [phone]

## 2023-05-21 ENCOUNTER — Encounter: Payer: Self-pay | Admitting: Physical Therapy

## 2023-05-23 ENCOUNTER — Other Ambulatory Visit: Payer: Self-pay | Admitting: General Surgery

## 2023-05-23 ENCOUNTER — Telehealth: Payer: Self-pay

## 2023-05-23 DIAGNOSIS — K8020 Calculus of gallbladder without cholecystitis without obstruction: Secondary | ICD-10-CM

## 2023-05-23 NOTE — Telephone Encounter (Signed)
Called and spoke with patient who stated that she was contacted around Christmas and informed that the insurance would authorize her [place] sleep study. She states tho that she has had numerous health issues since then and the sleep study has been placed on the back burner. She states she will continue to hold onto the monitor for now and plans on doing the study soon.

## 2023-05-27 ENCOUNTER — Encounter: Payer: Self-pay | Admitting: Physical Therapy

## 2023-05-27 ENCOUNTER — Ambulatory Visit: Payer: Commercial Managed Care - PPO | Admitting: Physical Therapy

## 2023-05-27 DIAGNOSIS — M792 Neuralgia and neuritis, unspecified: Secondary | ICD-10-CM

## 2023-05-27 DIAGNOSIS — M5459 Other low back pain: Secondary | ICD-10-CM | POA: Diagnosis not present

## 2023-05-27 DIAGNOSIS — M62838 Other muscle spasm: Secondary | ICD-10-CM

## 2023-05-27 NOTE — Therapy (Signed)
OUTPATIENT PHYSICAL THERAPY TREATMENT  Patient Name: Theresa Ortiz MRN: [id number] DOB:1990/06/06, 33 y.o., female Today's Date: 05/27/2023  END OF SESSION:    PT End of Session - 05/27/23 1843     Visit Number 35    Number of Visits 35    Date for PT Re-Evaluation 06/16/23    Authorization Type [place] [employer] FOCUS reporting period from 04/01/2023    Progress Note Due on Visit 40    PT Start Time 1737    PT Stop Time 1815    PT Time Calculation (min) 38 min    Activity Tolerance Patient tolerated treatment well    Behavior During Therapy [facility] for tasks assessed/performed               Past Medical History:  Diagnosis Date   Acute meniscal tear of knee    left knee   Allergy    Anxiety    Asthma    Atrial fibrillation (HCC)    slight   COVID-19 04/2020   Depression    Ovarian cyst    PVC (premature ventricular contraction)    Scoliosis    Shingles    Tachycardia    Past Surgical History:  Procedure Laterality Date   KNEE ARTHROSCOPY WITH LATERAL MENISECTOMY Left 09/01/2012   Procedure: LEFT KNEE ARTHROSCOPY WITH LATERAL MENISCECTOMY ;  Surgeon: Jacki Cones, MD;  Location: [facility];  Service: Orthopedics;  Laterality: Left;   MENISCUS REPAIR Right    RIGHT INDEX  FINGER TENDON REPAIR  NOV 2013   Patient Active Problem List   Diagnosis Date Noted   Palpitation 07/13/2018   Atypical chest pain 07/13/2018   Family history of heart disease 07/13/2018   Shortness of breath 07/13/2018   Sinus tachycardia 07/13/2018   Acute lateral meniscus tear of left knee 09/01/2012    PCP: Tisovec, Adelfa Koh, MD  REFERRING PROVIDER: Filomena Jungling, MD  REFERRING DIAG: 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 Pt reports she has some pain at the mid calf that is the size of packing tape that feels like a cramp. She reports she has tension at L low back and glute. LBP/glute pain aggravated by lifting/moving patients, and bending forward. Has had L sided sciatic symptoms 1x/week when lifting something heavy down post LLE. She hs previously had pain with prolonged walking, but that has subsided over the past year or so. Has had successful PT in the past with [facility], which she thinks is very helpful. Pt works full time as an Engineer, structural, enjoys traveling and walking her dog. Comorbid conditions as listed above. No falls in past 6 months. Pt denies N/V, B&B changes, unexplained weight fluctuation, saddle paresthesia, fever, night sweats, or unrelenting night pain at this time. Patient denies hx of cancer, stroke, seizures, diabetes, unexplained weight loss, unexplained changes in bowel or bladder problems, unexplained stumbling or dropping things, osteoporosis, and spinal surgery.  SUBJECTIVE:  SUBJECTIVE STATEMENT:   Patient states she is feeling okay but her low back is "a little angry" right after moving a memory foam mattress. She states at work today she had to spend a lot of time in stride stance with slight lumbar/hip flexion which lead to feeling some sharp pains in the left SIJ region for the first few steps after that. She is feeling a bit sore in her low back currently and has had off and on pain in her left anterior ankle. She had cramping on and off for 2 days in her left calf after last PT session. She has not been doing HEP because she needs gallbladder surgery more urgently that expected, so she has been  prepping her house.   Gallbadder surgery is July 01, 2023.   PAIN:  NPRS: 2/10 in L hip/SIJ region, R ankle 2/10, lower back 2/10.   PATIENT GOALS: Get the cramp out of my calf, to "eleviate whatever pissed it off this time out of the leg"   OBJECTIVE  TODAY'S TREATMENT   Therapeutic exercise: to centralize symptoms and improve ROM, strength, muscular endurance, and activity tolerance required for successful completion of functional activities.   Prone press up, 1x10 (increasing pain in left glute, no worse).   Quadruped hip hikes with BlackTB around distal thighs, one knee on 4 inch yoga block. 2x15 each side. Slow tempo.   Superset:  Half hooklying curl up with B shoulder flexion to neutral:  2x10 Single leg bridge with ankle dorsiflexed and holding opposite knee to chest:  2x10 each side  Neuromuscular Re-education: to improve, balance, postural strength, muscle activation patterns, and stabilization strength required for functional activities:  Bridge with marching with back on green theraball 3x10 each side Improving form with cuing for slight curl up with chin tucked and contraction of glute muscles to achieve a more stable core in a flat position.  SBA at ball for safety (pt using arms to catch LOB from ball)  Pt required multimodal cuing for proper technique and to facilitate improved neuromuscular control, strength, range of motion, and functional ability resulting in improved performance and form.   PATIENT EDUCATION:  Education details: Exercise purpose/form. Self management techniques.  Person educated: Patient Education method: Medical illustrator Education comprehension: verbalized understanding and returned demonstration  HOME EXERCISE PROGRAM: Access Code: [email or web address] URL: [email or web address] Date: 05/20/2023 Prepared by: Norton Blizzard  Exercises - Seated Pelvic Floor Elevators  - 1 x daily - 2 sets - 10 reps - Seated Quick Flick  Pelvic Floor Contractions  - 1 x daily - 2 sets - 10 reps - Seated Pelvic Floor Contraction  - 1 x daily - 2 sets - 10 reps - Supine Sciatic Nerve Glide  - 1-2 x daily - 7 x weekly - 10 reps - Bird Dog  - 3 x weekly - 2 sets - 10 reps - 5 second hold - Dead Bug  - 3 x weekly - 3 sets - 5-10 reps - Barbell Thruster  - 3 x weekly - 3 sets - 10 reps - Forearm Side Plank on Counter  - 3 x weekly - 3 sets - 30 seconds hold - Standard Plank  - 3 x weekly - 5 sets - 15 seconds hold - Seated Piriformis Stretch with Trunk Bend  - 3 x weekly - 3-5 reps - 10-30 seconds hold - Standing Anti-Rotation Press with Anchored Resistance  - 3 x weekly - 3 sets - 10 reps - 5 seconds hold -  Quadruped Hip Hike on Foam  - 3 x weekly - 2-3 sets - 15 reps  HOME EXERCISE PROGRAM [[email or web address]] View at "[email or web address]" using code: [email or web address] KICKSTAND DEADLIFT -  Repeat 10 Repetitions, Complete 3 Sets, Perform 3 Times a Week  HOME EXERCISE PROGRAM [[email or web address]] View at "[email or web address]" using code: [email or web address] LOCK CLAMS -  Repeat 20 Repetitions, Hold 1 Second(s), Complete 2 Sets, Perform 3 Times a Day  ASSESSMENT:  CLINICAL IMPRESSION:   Patient arrive with continued intermittent pain in her low back, left SIJ/glute region and right ankle. Today's session again utilized lumbar extension exercise to help manage radicular symptoms. Patient felt increased left glute discomfort with deepened lumbar extension stretch, but no worse after. Continued with exercises focused on lumbopelvic and core strength, endurance, and motor control. She had no worsening of pain by end of session. Plan to continue with core and hip strengthening as tolerated. Patient would benefit from continued management of limiting condition by skilled physical therapist to address remaining impairments and functional limitations to work towards stated goals and return to PLOF or maximal functional independence.   From Re-Evaluation 11/19/2022:  Patient is a  33 y.o. 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 Patient is returning to PT more than 90 days since last PT visit to continue care for the same condition as before. She completed 5 physical therapy sessions with improvement in symptoms before her attendance lapsed. She now presents with worsening left LE symptoms and low back and left glute pain since her work environment changed. She had good response to physical therapy intervention in the past and good potential for improvement again with consistent attendance. Patient did demonstrate improvement in trunk endurance at today's goal assessment. Patient presents with significant pain, paresthesia, muscle tension, motor control, muscle performance (strength/power/endurance), and activity tolerance impairments that are limiting ability to complete her usual activities such as working, walking, prolonged standing, bending, lifting, yardwork, housework, sleeping, traveling, prolonged sitting, going to the beach, anything that requires repetitive bending or lifting without difficulty. Patient will benefit from skilled physical therapy intervention to address current body structure impairments and activity limitations to improve function and work towards goals set in current POC in order to return to prior level of function or maximal functional improvement.   OBJECTIVE IMPAIRMENTS: Abnormal gait, decreased activity tolerance, decreased balance, decreased endurance, decreased knowledge of condition, decreased mobility, difficulty walking, decreased ROM, decreased strength, increased fascial restrictions, impaired perceived functional ability, increased muscle spasms, impaired flexibility, improper body mechanics, postural  dysfunction, obesity, and pain.   ACTIVITY LIMITATIONS: carrying, lifting, bending, sitting, standing, squatting, stairs, transfers, hygiene/grooming, locomotion level, and caring for others  PARTICIPATION LIMITATIONS: meal prep, cleaning, laundry, driving, shopping, community activity, occupation, yard work, and   working, walking, prolonged standing, bending, lifting, yardwork, housework, sleeping, traveling, prolonged sitting, going to [employer], anything that requires repetitive bending or lifting   PERSONAL FACTORS: Fitness, Past/current experiences, Time since onset of injury/illness/exacerbation, and 1-2 comorbidities: lateral meniscus tear of left knee; Palpitation; Atypical chest pain; Family history of heart disease; Shortness of breath; and Sinus tachycardia on their problem list. past medical history of Acute meniscal tear of knee, Allergy, Anxiety, Asthma, Atrial fibrillation (HCC), COVID-19 (04/2020), Depression, Ovarian cyst, PVC (premature ventricular contraction), Scoliosis, Shingles, and Tachycardia. past surgical history that includes RIGHT  INDEX  FINGER TENDON REPAIR (NOV 2013); Knee arthroscopy with lateral menisectomy (Left, 09/01/2012); and Meniscus repair (Right)  are also affecting patient's functional outcome.   REHAB POTENTIAL: Good  CLINICAL DECISION MAKING: Evolving/moderate complexity  EVALUATION COMPLEXITY: Moderate   GOALS: Goals reviewed with patient? Yes  SHORT TERM GOALS: Target date: 08/07/22. Target date updated to 12/04/2022 on 11/19/2022.   Pt will be independent with initial HEP in order to improve strength and balance in order to decrease fall risk and improve function at home and work. Baseline:HEP given; patient not participating (11/19/2022); participating regularly (12/02/2022);  Goal status: MET   LONG TERM GOALS: Target date: 08/26/22. Target date updated to 02/11/2023 for all unmet goals on 11/19/2022. Target date updated to 04/14/2023 for all unmet goals  on 01/20/2023.   Patient will demonstrate improved FOTO by equal or greater than 10 by visit #17 to demonstrate improvement in overall condition and self-reported functional ability.  Baseline: initial baseline 55 (07/01/2022); 60 at visit #17 (12/26/2022); 66 at visit #30 Goal status: MET  2.  Pt will decrease worst pain as reported on NPRS by at least 3 points in order to demonstrate clinically significant reduction in pain. Baseline: 4/10 (07/01/2022): up to 10/10  (11/19/2022); up to 4-5/10 when burning two days ago, 2.5/10 when excluding the burning (12/06/2022); 3-4/10 in the last 2 weeks (01/20/2023); Pain is 1.5/10 over the past 2 weeks  Goal status: on-going  3.  Pt will demonstrate plank time of or more to demonstrate age-predicted match of core strength needed for job duties Baseline: 10sec (07/01/2022): 24 seconds (11/19/2022); 23 seconds (12/26/2022); 37 seconds (01/20/2023); 35 seconds (04/01/2023) Goal status: In-progress  4.  Pt will demonstrate bering sorenson test of 34sec or more to demonstrate clinically significant increase in spinal extensor strength needed for job duties Baseline: 20sec (07/01/2022); 58 seconds (11/19/2022); 04/01/23: deferred  Goal status: MET  5.  Patient will be independent with initial home exercise program for self-management of symptoms. Baseline: HEP to be reviewed and updated as needed at visit 7 as appropriate (11/19/22); she has has been participating 2x a week plus PT sessions + what  is left over after PT session (12/26/2022);  Goal status: MET    PLAN:  PT FREQUENCY: 1-2x/week  PT DURATION: 6 weeks  PLANNED INTERVENTIONS: Therapeutic exercises, Therapeutic activity, Neuromuscular re-education, Balance training, Gait training, Patient/Family education, Self Care, Joint mobilization, Stair training, Dry Needling, Electrical stimulation, Spinal mobilization, Cryotherapy, Moist heat, Manual therapy, and Re-evaluation.  PLAN FOR NEXT SESSION:  abdominal  motor development and hip strength, continues with core strength and stabilization   Huntley Dec R. [name], PT, DPT 05/27/23, 6:59 PM  [facility] [address] [place], [place] [address] P: [phone] I F: [phone]

## 2023-06-03 ENCOUNTER — Encounter: Payer: Self-pay | Admitting: Physical Therapy

## 2023-06-03 ENCOUNTER — Ambulatory Visit: Payer: Commercial Managed Care - PPO | Attending: Physical Medicine & Rehabilitation | Admitting: Physical Therapy

## 2023-06-03 DIAGNOSIS — M5459 Other low back pain: Secondary | ICD-10-CM | POA: Insufficient documentation

## 2023-06-03 DIAGNOSIS — M62838 Other muscle spasm: Secondary | ICD-10-CM | POA: Insufficient documentation

## 2023-06-03 DIAGNOSIS — M792 Neuralgia and neuritis, unspecified: Secondary | ICD-10-CM | POA: Diagnosis not present

## 2023-06-03 NOTE — Therapy (Signed)
 OUTPATIENT PHYSICAL THERAPY TREATMENT  Patient Name: Theresa Ortiz MRN: [id number] DOB:12/11/1990, 33 y.o., female Today's Date: 06/03/2023  END OF SESSION:    PT End of Session - 06/03/23 1807     Visit Number 36    Number of Visits 40    Date for PT Re-Evaluation 06/16/23    Authorization Type [place] [employer] FOCUS reporting period from 04/01/2023    Progress Note Due on Visit 40    PT Start Time 1730    PT Stop Time 1825    PT Time Calculation (min) 55 min    Activity Tolerance Patient tolerated treatment well    Behavior During Therapy [facility] for tasks assessed/performed                Past Medical History:  Diagnosis Date   Acute meniscal tear of knee    left knee   Allergy    Anxiety    Asthma    Atrial fibrillation (HCC)    slight   COVID-19 04/2020   Depression    Ovarian cyst    PVC (premature ventricular contraction)    Scoliosis    Shingles    Tachycardia    Past Surgical History:  Procedure Laterality Date   KNEE ARTHROSCOPY WITH LATERAL MENISECTOMY Left 09/01/2012   Procedure: LEFT KNEE ARTHROSCOPY WITH LATERAL MENISCECTOMY ;  Surgeon: Tanda DELENA Heading, MD;  Location: [facility] [place];  Service: Orthopedics;  Laterality: Left;   MENISCUS REPAIR Right    RIGHT INDEX  FINGER TENDON REPAIR  NOV 2013   Patient Active Problem List   Diagnosis Date Noted   Palpitation 07/13/2018   Atypical chest pain 07/13/2018   Family history of heart disease 07/13/2018   Shortness of breath 07/13/2018   Sinus tachycardia 07/13/2018   Acute lateral meniscus tear of left knee 09/01/2012    PCP: Tisovec, Charlie ORN, MD  REFERRING PROVIDER: Delon Gins, MD  REFERRING DIAG: 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 Pt reports she has some pain at the mid calf that is the size of packing tape that feels like a cramp. She reports she has tension at L low back and glute. LBP/glute pain aggravated by lifting/moving patients, and bending forward. Has had L sided sciatic symptoms 1x/week when lifting something heavy down post LLE. She hs previously had pain with prolonged walking, but that has subsided over the past year or so. Has had successful PT in the past with [facility], which she thinks is very helpful. Pt works full time as an Engineer, structural, enjoys traveling and walking her dog. Comorbid conditions as listed above. No falls in past 6 months. Pt denies N/V, B&B changes, unexplained weight fluctuation, saddle paresthesia, fever, night sweats, or unrelenting night pain at this time. Patient denies hx of cancer, stroke, seizures, diabetes, unexplained weight loss, unexplained changes in bowel or bladder problems, unexplained stumbling or dropping things, osteoporosis, and spinal surgery.  SUBJECTIVE:  SUBJECTIVE STATEMENT:   Patient states she is really sore in her left glute region. She states she went to a [employer] race at [employer] and walking swiftly to get back to the car after the event she felt really tired. Today she has been feeling some nerve symptoms in the right foot at the anterior ankle and 2 toes. Left glute is really sore. She was good and sore after the ball exercise last PT session (mostly through her abdomen, especially along the sides).   [facility] surgery is July 01, 2023.   PAIN:  NPRS: 1.5/10 in L posterior glute, R ankle 2/10.   PATIENT GOALS: Get the cramp out of my calf, to eleviate whatever pissed  it off this time out of the leg   OBJECTIVE   LOWER LIMB NEURODYNAMIC TESTS Straight Leg Raise (Sciatic nerve) R  = negative for concordant LE symptoms, but does have increased tension relieved by ankle PF L  = negative for concordant LE symptoms, but does have increased tension relieved by ankle PF  Slump Test (entire nervous system) FLEXED lumbar position  R = positive for back pain relieved by neck extension   L = positive for back pain relieved by neck extension  EXTENDED lumbar position R = Unclear, positive for nerve tension and some back pain relieved by neck extension but not strong reproduction of concordant nerve symptoms.  L = Unclear, positive for nerve tension and some back pain relieved by neck extension but not strong reproduction of concordant nerve symptoms  TODAY'S TREATMENT   Therapeutic exercise: therapeutic exercises that incorporate ONE parameter at one or more areas of the body to centralize symptoms, develop strength and endurance, range of motion, and flexibility required for successful completion of functional activities.  Prone press up  1x10   2x10 with hands elevated on 4 inch yoga blocks, with lock and sag (self overpressure).  Second set at end of session. Improving range before she feels discomfort at left glute (end range, then upon eccentric portion of exercise)  Therapeutic activities: dynamic therapeutic activities incorporating MULTIPLE parameters or areas of the body designed to achieve improved functional performance.  Bridge with marching with back on green theraball 3x10 each side Improving form with cuing for slight curl up with chin tucked and contraction of glute muscles to achieve a more stable core in a flat position.  SBA at ball for safety (pt using arms to catch LOB from ball)  Barbell back squat 3x10 with 23# bar over chair for safety, depth to tolerance Limited some by non-stretchy clothing catching on body  Superset:   Single leg bridge with ankle dorsiflexed and holding opposite knee to chest:  3x10 each side Half hooklying curl up with B shoulder flexion to neutral:  3x10  Neuromuscular Re-education: a technique or exercise performed with the goal of improving the level of communication between the body and the brain, such as for balance, motor control, muscle activation patterns, coordination, desensitization, quality of muscle contraction, proprioception, and/or kinesthetic sense needed for successful and safe completion of functional activities.   Neurodynamic testing to help determine need for neurodynamic exercises such as nerve glides (see above)  Pt required multimodal cuing for proper technique and to facilitate improved neuromuscular control, strength, range of motion, and functional ability resulting in improved performance and form.   PATIENT EDUCATION:  Education details: Exercise purpose/form. Self management techniques.  Person educated: Patient Education method: Medical Illustrator Education comprehension: verbalized understanding and returned demonstration  HOME EXERCISE PROGRAM: Access Code: [email or web address] URL: [email or web address] Date: 05/20/2023 Prepared by: Camie Cleverly  Exercises - Seated Pelvic Floor Elevators  - 1 x daily - 2 sets - 10 reps - Seated Quick Flick Pelvic Floor Contractions  - 1 x daily - 2 sets - 10 reps - Seated Pelvic Floor Contraction  - 1 x daily - 2 sets - 10 reps - Supine Sciatic Nerve Glide  - 1-2 x daily - 7 x weekly - 10 reps - Bird Dog  - 3 x weekly - 2 sets - 10 reps - 5 second hold - Dead Bug  - 3 x weekly - 3 sets - 5-10 reps - Barbell Thruster  - 3 x weekly - 3 sets - 10 reps - Forearm Side Plank on Counter  - 3 x weekly - 3 sets - 30 seconds hold - Standard Plank  - 3 x weekly - 5 sets - 15 seconds hold - Seated Piriformis Stretch with Trunk Bend  - 3 x weekly - 3-5 reps - 10-30 seconds hold - Standing Anti-Rotation Press  with Anchored Resistance  - 3 x weekly - 3 sets - 10 reps - 5 seconds hold - Quadruped Hip Hike on Foam  - 3 x weekly - 2-3 sets - 15 reps  HOME EXERCISE PROGRAM [[email or web address]] View at [email or web address] using code: [email or web address] KICKSTAND DEADLIFT -  Repeat 10 Repetitions, Complete 3 Sets, Perform 3 Times a Week  HOME EXERCISE PROGRAM [[email or web address]] View at [email or web address] using code: [email or web address] LOCK CLAMS -  Repeat 20 Repetitions, Hold 1 Second(s), Complete 2 Sets, Perform 3 Times a Day  ASSESSMENT:  CLINICAL IMPRESSION:   Patient continues to report L > R LE symptoms that sound like neural irritation of sciatic distribution. Although she has significant neural tightness with SLR and slump test (in lumbar  flexion and extension) it does not strongly reproduce her LE symptoms. She does appear to gain some ROM and reduce some irritation with repeated extension exercise, so this was performed before and after strengthening exercises. She was able to tolerate exercises targeting glute, core, and functional strength without increase in symptoms but ocntinues to have discomfort in left glute. Patient would benefit from continued management of limiting condition by skilled physical therapist to address remaining impairments and functional limitations to work towards stated goals and return to PLOF or maximal functional independence.   From Re-Evaluation 11/19/2022:  Patient is a 33 y.o. 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 Patient is returning to PT more than 90 days since last PT visit to continue care for the same condition as before. She completed 5 physical therapy sessions with improvement in symptoms before her attendance lapsed. She now presents with worsening left  LE symptoms and low back and left glute pain since her work environment changed. She had good response to physical therapy intervention in the past and good potential for improvement again with consistent attendance. Patient did demonstrate improvement in trunk endurance at today's goal assessment. Patient presents with significant pain, paresthesia, muscle tension, motor control, muscle performance (strength/power/endurance), and activity tolerance impairments that are limiting ability to complete her usual activities such as working, walking, prolonged standing, bending, lifting, yardwork, housework, sleeping, traveling, prolonged sitting, going to the beach, anything that requires repetitive bending or lifting without difficulty. Patient  will benefit from skilled physical therapy intervention to address current body structure impairments and activity limitations to improve function and work towards goals set in current POC in order to return to prior level of function or maximal functional improvement.   OBJECTIVE IMPAIRMENTS: Abnormal gait, decreased activity tolerance, decreased balance, decreased endurance, decreased knowledge of condition, decreased mobility, difficulty walking, decreased ROM, decreased strength, increased fascial restrictions, impaired perceived functional ability, increased muscle spasms, impaired flexibility, improper body mechanics, postural dysfunction, obesity, and pain.   ACTIVITY LIMITATIONS: carrying, lifting, bending, sitting, standing, squatting, stairs, transfers, hygiene/grooming, locomotion level, and caring for others  PARTICIPATION LIMITATIONS: meal prep, cleaning, laundry, driving, shopping, community activity, occupation, yard work, and   working, walking, prolonged standing, bending, lifting, yardwork, housework, sleeping, traveling, prolonged sitting, going to [employer], anything that requires repetitive bending or lifting   PERSONAL FACTORS: Fitness, Past/current  experiences, Time since onset of injury/illness/exacerbation, and 1-2 comorbidities: lateral meniscus tear of left knee; Palpitation; Atypical chest pain; Family history of heart disease; Shortness of breath; and Sinus tachycardia on their problem list. past medical history of Acute meniscal tear of knee, Allergy, Anxiety, Asthma, Atrial fibrillation (HCC), COVID-19 (04/2020), Depression, Ovarian cyst, PVC (premature ventricular contraction), Scoliosis, Shingles, and Tachycardia. past surgical history that includes RIGHT INDEX  FINGER TENDON REPAIR (NOV 2013); Knee arthroscopy with lateral menisectomy (Left, 09/01/2012); and Meniscus repair (Right)  are also affecting patient's functional outcome.   REHAB POTENTIAL: Good  CLINICAL DECISION MAKING: Evolving/moderate complexity  EVALUATION COMPLEXITY: Moderate   GOALS: Goals reviewed with patient? Yes  SHORT TERM GOALS: Target date: 08/07/22. Target date updated to 12/04/2022 on 11/19/2022.   Pt will be independent with initial HEP in order to improve strength and balance in order to decrease fall risk and improve function at home and work. Baseline:HEP given; patient not participating (11/19/2022); participating regularly (12/02/2022);  Goal status: MET   LONG TERM GOALS: Target date: 08/26/22. Target date updated to 02/11/2023 for all unmet goals on 11/19/2022. Target date updated to 04/14/2023 for all unmet goals on 01/20/2023.   Patient will demonstrate improved FOTO by equal or greater than 10 by visit #17 to demonstrate improvement in overall condition and self-reported functional ability.  Baseline: initial baseline 55 (07/01/2022); 60 at visit #17 (12/26/2022); 66 at visit #30 Goal status: MET  2.  Pt will decrease worst pain as reported on NPRS by at least 3 points in order to demonstrate clinically significant reduction in pain. Baseline: 4/10 (07/01/2022): up to 10/10  (11/19/2022); up to 4-5/10 when burning two days ago, 2.5/10 when excluding the  burning (12/06/2022); 3-4/10 in the last 2 weeks (01/20/2023); Pain is 1.5/10 over the past 2 weeks  Goal status: on-going  3.  Pt will demonstrate plank time of or more to demonstrate age-predicted match of core strength needed for job duties Baseline: 10sec (07/01/2022): 24 seconds (11/19/2022); 23 seconds (12/26/2022); 37 seconds (01/20/2023); 35 seconds (04/01/2023) Goal status: In-progress  4.  Pt will demonstrate bering sorenson test of 34sec or more to demonstrate clinically significant increase in spinal extensor strength needed for job duties Baseline: 20sec (07/01/2022); 58 seconds (11/19/2022); 04/01/23: deferred  Goal status: MET  5.  Patient will be independent with initial home exercise program for self-management of symptoms. Baseline: HEP to be reviewed and updated as needed at visit 7 as appropriate (11/19/22); she has has been participating 2x a week plus PT sessions + what  is left over after PT session (12/26/2022);  Goal status: MET  PLAN:  PT FREQUENCY: 1-2x/week  PT DURATION: 6 weeks  PLANNED INTERVENTIONS: Therapeutic exercises, Therapeutic activity, Neuromuscular re-education, Balance training, Gait training, Patient/Family education, Self Care, Joint mobilization, Stair training, Dry Needling, Electrical stimulation, Spinal mobilization, Cryotherapy, Moist heat, Manual therapy, and Re-evaluation.  PLAN FOR NEXT SESSION:  abdominal motor development and hip strength, continues with core strength and stabilization   Camie R. Juli, PT, DPT 06/03/23, 7:07 PM  [facility] [address] [place], [place] [address] P: [phone] I F: [phone]

## 2023-06-05 ENCOUNTER — Other Ambulatory Visit: Payer: Self-pay

## 2023-06-05 DIAGNOSIS — J45901 Unspecified asthma with (acute) exacerbation: Secondary | ICD-10-CM | POA: Diagnosis not present

## 2023-06-05 DIAGNOSIS — J209 Acute bronchitis, unspecified: Secondary | ICD-10-CM | POA: Diagnosis not present

## 2023-06-05 MED ORDER — AZITHROMYCIN 250 MG PO TABS
ORAL_TABLET | ORAL | 0 refills | Status: DC
  Filled 2023-06-05: qty 6, 5d supply, fill #0

## 2023-06-05 MED ORDER — PROMETHAZINE-DM 6.25-15 MG/5ML PO SYRP
ORAL_SOLUTION | ORAL | 0 refills | Status: DC
  Filled 2023-06-05: qty 120, 4d supply, fill #0

## 2023-06-05 MED ORDER — PREDNISONE 20 MG PO TABS
ORAL_TABLET | ORAL | 0 refills | Status: DC
  Filled 2023-06-05: qty 10, 5d supply, fill #0

## 2023-06-06 ENCOUNTER — Other Ambulatory Visit: Payer: Self-pay

## 2023-06-06 MED ORDER — HYDROCODONE BIT-HOMATROP MBR 5-1.5 MG/5ML PO SOLN
ORAL | 0 refills | Status: DC
  Filled 2023-06-06: qty 60, 3d supply, fill #0

## 2023-06-10 ENCOUNTER — Other Ambulatory Visit: Payer: Self-pay

## 2023-06-10 ENCOUNTER — Ambulatory Visit: Payer: Commercial Managed Care - PPO | Admitting: Physical Therapy

## 2023-06-10 DIAGNOSIS — J40 Bronchitis, not specified as acute or chronic: Secondary | ICD-10-CM | POA: Diagnosis not present

## 2023-06-10 DIAGNOSIS — R051 Acute cough: Secondary | ICD-10-CM | POA: Diagnosis not present

## 2023-06-10 MED ORDER — PREDNISONE 10 MG PO TABS
ORAL_TABLET | ORAL | 0 refills | Status: AC
  Filled 2023-06-10: qty 21, 6d supply, fill #0

## 2023-06-18 ENCOUNTER — Ambulatory Visit: Payer: Commercial Managed Care - PPO | Admitting: Physical Therapy

## 2023-06-24 ENCOUNTER — Encounter: Payer: Self-pay | Admitting: Physical Therapy

## 2023-06-25 ENCOUNTER — Encounter: Payer: Self-pay | Admitting: Physical Therapy

## 2023-06-25 ENCOUNTER — Ambulatory Visit: Payer: Commercial Managed Care - PPO | Admitting: Physical Therapy

## 2023-06-25 DIAGNOSIS — M5459 Other low back pain: Secondary | ICD-10-CM | POA: Diagnosis not present

## 2023-06-25 DIAGNOSIS — M62838 Other muscle spasm: Secondary | ICD-10-CM

## 2023-06-25 DIAGNOSIS — M792 Neuralgia and neuritis, unspecified: Secondary | ICD-10-CM | POA: Diagnosis not present

## 2023-06-25 NOTE — Therapy (Signed)
 OUTPATIENT PHYSICAL THERAPY TREATMENT   Patient Name: Theresa Ortiz MRN: [id number] DOB:1990-10-14, 33 y.o., female Today's Date: 06/25/2023  END OF SESSION:    PT End of Session - 06/25/23 1733     Visit Number 37    Number of Visits 40    Date for PT Re-Evaluation 06/16/23    Authorization Type [place] [employer] FOCUS reporting period from 04/01/2023    Progress Note Due on Visit 40    PT Start Time 1732    PT Stop Time 1820    PT Time Calculation (min) 48 min    Activity Tolerance Patient tolerated treatment well    Behavior During Therapy [facility] for tasks assessed/performed                 Past Medical History:  Diagnosis Date   Acute meniscal tear of knee    left knee   Allergy    Anxiety    Asthma    Atrial fibrillation (HCC)    slight   COVID-19 04/2020   Depression    Ovarian cyst    PVC (premature ventricular contraction)    Scoliosis    Shingles    Tachycardia    Past Surgical History:  Procedure Laterality Date   KNEE ARTHROSCOPY WITH LATERAL MENISECTOMY Left 09/01/2012   Procedure: LEFT KNEE ARTHROSCOPY WITH LATERAL MENISCECTOMY ;  Surgeon: Jacki Cones, MD;  Location: [facility] [place];  Service: Orthopedics;  Laterality: Left;   MENISCUS REPAIR Right    RIGHT INDEX  FINGER TENDON REPAIR  NOV 2013   Patient Active Problem List   Diagnosis Date Noted   Palpitation 07/13/2018   Atypical chest pain 07/13/2018   Family history of heart disease 07/13/2018   Shortness of breath 07/13/2018   Sinus tachycardia 07/13/2018   Acute lateral meniscus tear of left knee 09/01/2012    PCP: Tisovec, Adelfa Koh, MD  REFERRING PROVIDER: Filomena Jungling, MD  REFERRING DIAG: 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 Pt reports she has some pain at the mid calf that is the size of packing tape that feels like a cramp. She reports she has tension at L low back and glute. LBP/glute pain aggravated by lifting/moving patients, and bending forward. Has had L sided sciatic symptoms 1x/week when lifting something heavy down post LLE. She hs previously had pain with prolonged walking, but that has subsided over the past year or so. Has had successful PT in the past with [facility], which she thinks is very helpful. Pt works full time as an Engineer, structural, enjoys traveling and walking her dog. Comorbid conditions as listed above. No falls in past 6 months. Pt denies N/V, B&B changes, unexplained weight fluctuation, saddle paresthesia, fever, night sweats, or unrelenting night pain at this time. Patient denies hx of cancer, stroke, seizures, diabetes, unexplained weight loss, unexplained changes in bowel or bladder problems, unexplained stumbling or dropping things, osteoporosis, and spinal surgery.  SUBJECTIVE:  SUBJECTIVE STATEMENT:   Patient states she is not doing well. She didn't do anything except when she was wearing lead she reached up really tall like a star, then squatted down on 06/23/2023. She had nerve pain in her right SIJ to that night which made it really hard to sleep. Yesterday it started aching into the right buttocks and started having a sensation at the top of her right foot. Yesterday while at work she felt it near both SIJ and got more "nervie" into the left thigh and wrapping around the anterior left ankle. Driving here today it throbbing in her left glute . Marland Kitchen She has pain now near L4-5. It did not bother her much when  walking and moving at work but she had pain when sitting. She purchased the yoga blocks, but between the pain this week and being sick last week her HEP participation has not been good.   Gallbadder surgery is July 01, 2023.   PAIN:  NPRS: 3/10 at low back and near B SIJ.   PATIENT GOALS: Get the cramp out of my calf, to "eleviate whatever pissed it off this time out of the leg"   OBJECTIVE  TODAY'S TREATMENT   Therapeutic exercise: therapeutic exercises that incorporate ONE parameter at one or more areas of the body to centralize symptoms, develop strength and endurance, range of motion, and flexibility required for successful completion of functional activities.  Prone press up  1x10   1x10, 1x6 with hands elevated on 4 inch yoga blocks, with lock and sag (self overpressure).  Second set at end of session but discontinued due to increasing shooting pain down L LE last 2 completed reps  Bridge with marching with back supported 1x10 with back on green theraball 3x10 each side with back supported on plinth Airex pad added under buttocks on last 2 sets Improving form with cuing for slight curl up with chin tucked and contraction of glute muscles to achieve a more stable core in a flat position.  SBA at ball for safety (pt using arms to catch LOB from ball)  Superset:  Single leg bridge with ankle dorsiflexed and holding opposite knee to chest:  3x10 each side Half hooklying curl up with B shoulder flexion to neutral:  3x10  Therapeutic activities: dynamic therapeutic activities incorporating MULTIPLE parameters or areas of the body designed to achieve improved functional performance.  Barbell back squat 2x10 with 23# bar  1x6 with 23# bar  over chair for safety  depth to tolerance Discontinued during last set due to increasing pulling in left adductors  Pt required multimodal cuing for proper technique and to facilitate improved neuromuscular control, strength, range of  motion, and functional ability resulting in improved performance and form.   PATIENT EDUCATION:  Education details: Exercise purpose/form. Self management techniques.  Person educated: Patient Education method: Medical illustrator Education comprehension: verbalized understanding and returned demonstration  HOME EXERCISE PROGRAM: Access Code: [email or web address] URL: [email or web address] Date: 05/20/2023 Prepared by: Norton Blizzard  Exercises - Seated Pelvic Floor Elevators  - 1 x daily - 2 sets - 10 reps - Seated Quick Flick Pelvic Floor Contractions  - 1 x daily - 2 sets - 10 reps - Seated Pelvic Floor Contraction  - 1 x daily - 2 sets - 10 reps - Supine Sciatic Nerve Glide  - 1-2 x daily - 7 x weekly - 10 reps - Bird Dog  - 3 x weekly - 2 sets - 10 reps - 5 second hold -  Dead Bug  - 3 x weekly - 3 sets - 5-10 reps - Barbell Thruster  - 3 x weekly - 3 sets - 10 reps - Forearm Side Plank on Counter  - 3 x weekly - 3 sets - 30 seconds hold - Standard Plank  - 3 x weekly - 5 sets - 15 seconds hold - Seated Piriformis Stretch with Trunk Bend  - 3 x weekly - 3-5 reps - 10-30 seconds hold - Standing Anti-Rotation Press with Anchored Resistance  - 3 x weekly - 3 sets - 10 reps - 5 seconds hold - Quadruped Hip Hike on Foam  - 3 x weekly - 2-3 sets - 15 reps  HOME EXERCISE PROGRAM [[email or web address]] View at "[email or web address]" using code: [email or web address] KICKSTAND DEADLIFT -  Repeat 10 Repetitions, Complete 3 Sets, Perform 3 Times a Week  HOME EXERCISE PROGRAM [[email or web address]] View at "[email or web address]" using code: [email or web address] LOCK CLAMS -  Repeat 20 Repetitions, Hold 1 Second(s), Complete 2 Sets, Perform 3 Times a Day  ASSESSMENT:  CLINICAL IMPRESSION:   Patient returns to PT after missing several appointments due to unexpected weather and illness.  Continued with exercises for extension preference and core/functional strengthening to improve symptoms and functional capacity. Patient  continues to do better when she is able to do consistent strengthening. She is having gallbladder surgery next week and PT will be paused until she is cleared to return to PT. She continues to have back pain, SIJ region pain, and neuro symptoms down her leg. Patient would benefit from continued management of limiting condition when medically ready by skilled physical therapist to address remaining impairments and functional limitations to work towards stated goals and return to PLOF or maximal functional independence.    From Re-Evaluation 11/19/2022:  Patient is a 33 y.o. 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 Patient is returning to PT more than 90 days since last PT visit to continue care for the same condition as before. She completed 5 physical therapy sessions with improvement in symptoms before her attendance lapsed. She now presents with worsening left LE symptoms and low back and left glute pain since her work environment changed. She had good response to physical therapy intervention in the past and good potential for improvement again with consistent attendance. Patient did demonstrate improvement in trunk endurance at today's goal assessment. Patient presents with significant pain, paresthesia, muscle tension, motor control, muscle performance (strength/power/endurance), and activity tolerance impairments that are limiting ability to complete her usual activities such as working, walking, prolonged standing, bending, lifting, yardwork, housework, sleeping, traveling, prolonged sitting, going to the beach, anything that requires repetitive bending or lifting without difficulty. Patient will benefit from skilled physical therapy intervention to address current body  structure impairments and activity limitations to improve function and work towards goals set in current POC in order to return to prior level of function or maximal functional improvement.   OBJECTIVE IMPAIRMENTS: Abnormal gait, decreased activity tolerance, decreased balance, decreased endurance, decreased knowledge of condition, decreased mobility, difficulty walking, decreased ROM, decreased strength, increased fascial restrictions, impaired perceived functional ability, increased muscle spasms, impaired flexibility, improper body mechanics, postural dysfunction, obesity, and pain.   ACTIVITY LIMITATIONS: carrying, lifting, bending, sitting, standing, squatting, stairs, transfers, hygiene/grooming, locomotion level, and caring for others  PARTICIPATION LIMITATIONS: meal prep, cleaning,  laundry, driving, shopping, community activity, occupation, yard work, and   working, walking, prolonged standing, bending, lifting, yardwork, housework, sleeping, traveling, prolonged sitting, going to [employer], anything that requires repetitive bending or lifting   PERSONAL FACTORS: Fitness, Past/current experiences, Time since onset of injury/illness/exacerbation, and 1-2 comorbidities: lateral meniscus tear of left knee; Palpitation; Atypical chest pain; Family history of heart disease; Shortness of breath; and Sinus tachycardia on their problem list. past medical history of Acute meniscal tear of knee, Allergy, Anxiety, Asthma, Atrial fibrillation (HCC), COVID-19 (04/2020), Depression, Ovarian cyst, PVC (premature ventricular contraction), Scoliosis, Shingles, and Tachycardia. past surgical history that includes RIGHT INDEX  FINGER TENDON REPAIR (NOV 2013); Knee arthroscopy with lateral menisectomy (Left, 09/01/2012); and Meniscus repair (Right)  are also affecting patient's functional outcome.   REHAB POTENTIAL: Good  CLINICAL DECISION MAKING: Evolving/moderate complexity  EVALUATION COMPLEXITY:  Moderate   GOALS: Goals reviewed with patient? Yes  SHORT TERM GOALS: Target date: 08/07/22. Target date updated to 12/04/2022 on 11/19/2022.   Pt will be independent with initial HEP in order to improve strength and balance in order to decrease fall risk and improve function at home and work. Baseline:HEP given; patient not participating (11/19/2022); participating regularly (12/02/2022);  Goal status: MET   LONG TERM GOALS: Target date: 08/26/22. Target date updated to 02/11/2023 for all unmet goals on 11/19/2022. Target date updated to 04/14/2023 for all unmet goals on 01/20/2023.   Patient will demonstrate improved FOTO by equal or greater than 10 by visit #17 to demonstrate improvement in overall condition and self-reported functional ability.  Baseline: initial baseline 55 (07/01/2022); 60 at visit #17 (12/26/2022); 66 at visit #30 Goal status: MET  2.  Pt will decrease worst pain as reported on NPRS by at least 3 points in order to demonstrate clinically significant reduction in pain. Baseline: 4/10 (07/01/2022): up to 10/10  (11/19/2022); up to 4-5/10 when burning two days ago, 2.5/10 when excluding the burning (12/06/2022); 3-4/10 in the last 2 weeks (01/20/2023); Pain is 1.5/10 over the past 2 weeks  Goal status: on-going  3.  Pt will demonstrate plank time of or more to demonstrate age-predicted match of core strength needed for job duties Baseline: 10sec (07/01/2022): 24 seconds (11/19/2022); 23 seconds (12/26/2022); 37 seconds (01/20/2023); 35 seconds (04/01/2023) Goal status: In-progress  4.  Pt will demonstrate bering sorenson test of 34sec or more to demonstrate clinically significant increase in spinal extensor strength needed for job duties Baseline: 20sec (07/01/2022); 58 seconds (11/19/2022); 04/01/23: deferred  Goal status: MET  5.  Patient will be independent with initial home exercise program for self-management of symptoms. Baseline: HEP to be reviewed and updated as needed at visit 7  as appropriate (11/19/22); she has has been participating 2x a week plus PT sessions + what  is left over after PT session (12/26/2022);  Goal status: MET    PLAN:  PT FREQUENCY: 1-2x/week  PT DURATION: 6 weeks  PLANNED INTERVENTIONS: Therapeutic exercises, Therapeutic activity, Neuromuscular re-education, Balance training, Gait training, Patient/Family education, Self Care, Joint mobilization, Stair training, Dry Needling, Electrical stimulation, Spinal mobilization, Cryotherapy, Moist heat, Manual therapy, and Re-evaluation.  PLAN FOR NEXT SESSION:  Hold PT until after given medical clearance following gallbladder surgery.    Luretha Murphy. [name], PT, DPT 06/25/23, 6:52 PM  [facility] [address] [place], [place] [address] P: [phone] I F: [phone]

## 2023-06-26 ENCOUNTER — Encounter: Payer: Self-pay | Admitting: Physical Therapy

## 2023-06-26 NOTE — Progress Notes (Signed)
 Surgical Instructions   Your procedure is scheduled on Tuesday, March 4th, 2025. Report to [facility] Main Entrance "A" at 5:30 A.M., then check in with the Admitting office. Any questions or running late day of surgery: call [phone]  Questions prior to your surgery date: call [phone], Monday-Friday, 8am-4pm. If you experience any cold or flu symptoms such as cough, fever, chills, shortness of breath, etc. between now and your scheduled surgery, please notify us at the above number.     Remember:  Do not eat after midnight the night before your surgery   You may drink clear liquids until 4:30 the morning of your surgery.   Clear liquids allowed are: Water, Non-Citrus Juices (without pulp), Carbonated Beverages, Clear Tea (no milk, honey, etc.), Black Coffee Only (NO MILK, CREAM OR POWDERED CREAMER of any kind), and Gatorade.  Patient Instructions  The night before surgery:  No food after midnight. ONLY clear liquids after midnight  The day of surgery (if you do NOT have diabetes):  Drink ONE (1) Pre-Surgery Clear Ensure by 4:30 the morning of surgery. Drink in one sitting. Do not sip.  This drink was given to you during your hospital  pre-op appointment visit.  Nothing else to drink after completing the  Pre-Surgery Clear Ensure.          If you have questions, please contact your surgeon's office.     Take these medicines the morning of surgery with A SIP OF WATER: Fluoxetine (Prozac) Metoprolol Succinate (Toprol-XL)   May take these medicines IF NEEDED: Albuterol Inhaler - please bring your inhaler with you on the day of surgery Famotidine (Pepcid)    One week prior to surgery, STOP taking any Aspirin (unless otherwise instructed by your surgeon) Aleve, Naproxen, Ibuprofen, Motrin, Advil, Goody's, BC's, all herbal medications, fish oil, and non-prescription vitamins.  Semaglutide (Ozempic) should be stopped 7 days prior to your procedure.  Your last dose  should be on Monday, February 24th.                       Do NOT Smoke (Tobacco/Vaping) for 24 hours prior to your procedure.  If you use a CPAP at night, you may bring your mask/headgear for your overnight stay.   You will be asked to remove any contacts, glasses, piercing's, hearing aid's, dentures/partials prior to surgery. Please bring cases for these items if needed.    Patients discharged the day of surgery will not be allowed to drive home, and someone needs to stay with them for 24 hours.  SURGICAL WAITING ROOM VISITATION Patients may have no more than 2 support people in the waiting area - these visitors may rotate.   Pre-op nurse will coordinate an appropriate time for 1 ADULT support person, who may not rotate, to accompany patient in pre-op.  Children under the age of 31 must have an adult with them who is not the patient and must remain in the main waiting area with an adult.  If the patient needs to stay at the hospital during part of their recovery, the visitor guidelines for inpatient rooms apply.  Please refer to the [facility] website for the visitor guidelines for any additional information.   If you received a COVID test during your pre-op visit  it is requested that you wear a mask when out in public, stay away from anyone that may not be feeling well and notify your surgeon if you develop symptoms. If you have been  in contact with anyone that has tested positive in the last 10 days please notify you surgeon.      Pre-operative CHG Bathing Instructions   You can play a key role in reducing the risk of infection after surgery. Your skin needs to be as free of germs as possible. You can reduce the number of germs on your skin by washing with CHG (chlorhexidine gluconate) soap before surgery. CHG is an antiseptic soap that kills germs and continues to kill germs even after washing.   DO NOT use if you have an allergy to chlorhexidine/CHG or antibacterial soaps. If  your skin becomes reddened or irritated, stop using the CHG and notify one of our RNs at [phone].              TAKE A SHOWER THE NIGHT BEFORE SURGERY AND THE DAY OF SURGERY    Please keep in mind the following:  DO NOT shave, including legs and underarms, 48 hours prior to surgery.   You may shave your face before/day of surgery.  Place clean sheets on your bed the night before surgery Use a clean washcloth (not used since being washed) for each shower. DO NOT sleep with pet's night before surgery.  CHG Shower Instructions:  Wash your face and private area with normal soap. If you choose to wash your hair, wash first with your normal shampoo.  After you use shampoo/soap, rinse your hair and body thoroughly to remove shampoo/soap residue.  Turn the water OFF and apply half the bottle of CHG soap to a CLEAN washcloth.  Apply CHG soap ONLY FROM YOUR NECK DOWN TO YOUR TOES (washing for 3-5 minutes)  DO NOT use CHG soap on face, private areas, open wounds, or sores.  Pay special attention to the area where your surgery is being performed.  If you are having back surgery, having someone wash your back for you may be helpful. Wait 2 minutes after CHG soap is applied, then you may rinse off the CHG soap.  Pat dry with a clean towel  Put on clean pajamas    Additional instructions for the day of surgery: DO NOT APPLY any lotions, deodorants, cologne, or perfumes.   Do not wear jewelry or makeup Do not wear nail polish, gel polish, artificial nails, or any other type of covering on natural nails (fingers and toes) Do not bring valuables to the hospital. [facility] is not responsible for valuables/personal belongings. Put on clean/comfortable clothes.  Please brush your teeth.  Ask your nurse before applying any prescription medications to the skin.

## 2023-06-27 ENCOUNTER — Other Ambulatory Visit: Payer: Self-pay

## 2023-06-27 ENCOUNTER — Encounter (HOSPITAL_COMMUNITY)
Admission: RE | Admit: 2023-06-27 | Discharge: 2023-06-27 | Disposition: A | Discharge status: Home / Self Care | Payer: Commercial Managed Care - PPO | Source: Ambulatory Visit | Attending: General Surgery | Admitting: General Surgery

## 2023-06-27 ENCOUNTER — Encounter (HOSPITAL_COMMUNITY): Payer: Self-pay

## 2023-06-27 VITALS — BP 104/76 | HR 83 | Temp 97.9°F | Resp 17 | Ht 65.0 in | Wt 256.0 lb

## 2023-06-27 DIAGNOSIS — Z01818 Encounter for other preprocedural examination: Secondary | ICD-10-CM

## 2023-06-27 DIAGNOSIS — K8020 Calculus of gallbladder without cholecystitis without obstruction: Secondary | ICD-10-CM | POA: Diagnosis not present

## 2023-06-27 DIAGNOSIS — Z01812 Encounter for preprocedural laboratory examination: Secondary | ICD-10-CM | POA: Insufficient documentation

## 2023-06-27 DIAGNOSIS — R7301 Impaired fasting glucose: Secondary | ICD-10-CM | POA: Diagnosis not present

## 2023-06-27 DIAGNOSIS — R7989 Other specified abnormal findings of blood chemistry: Secondary | ICD-10-CM | POA: Diagnosis not present

## 2023-06-27 DIAGNOSIS — Z79899 Other long term (current) drug therapy: Secondary | ICD-10-CM | POA: Diagnosis not present

## 2023-06-27 HISTORY — DX: Essential (primary) hypertension: I10

## 2023-06-27 HISTORY — DX: Gastro-esophageal reflux disease without esophagitis: K21.9

## 2023-06-27 LAB — COMPREHENSIVE METABOLIC PANEL
ALT: 13 U/L (ref 0–44)
AST: 21 U/L (ref 15–41)
Albumin: 3.4 g/dL — ABNORMAL LOW (ref 3.5–5.0)
Alkaline Phosphatase: 57 U/L (ref 38–126)
Anion gap: 8 (ref 5–15)
BUN: 8 mg/dL (ref 6–20)
CO2: 26 mmol/L (ref 22–32)
Calcium: 9.2 mg/dL (ref 8.9–10.3)
Chloride: 105 mmol/L (ref 98–111)
Creatinine, Ser: 0.69 mg/dL (ref 0.44–1.00)
GFR, Estimated: >60 mL/min (ref >60)
Glucose, Bld: 146 mg/dL — ABNORMAL HIGH (ref 70–99)
Potassium: 4.1 mmol/L (ref 3.5–5.1)
Sodium: 139 mmol/L (ref 135–145)
Total Bilirubin: 0.5 mg/dL (ref 0.0–1.2)
Total Protein: 6.5 g/dL (ref 6.5–8.1)

## 2023-06-27 LAB — CBC
HCT: 38.6 % (ref 36.0–46.0)
Hemoglobin: 12.8 g/dL (ref 12.0–15.0)
MCH: 27.9 pg (ref 26.0–34.0)
MCHC: 33.2 g/dL (ref 30.0–36.0)
MCV: 84.1 fL (ref 80.0–100.0)
Platelets: 262 10*3/uL (ref 150–400)
RBC: 4.59 MIL/uL (ref 3.87–5.11)
RDW: 13.4 % (ref 11.5–15.5)
WBC: 6.4 10*3/uL (ref 4.0–10.5)
nRBC: 0 % (ref 0.0–0.2)

## 2023-06-27 NOTE — Progress Notes (Addendum)
 PCP - Dr. Gerlene Burdock T[place]ac Cardiologist - Dr. Lennie Odor  PPM/ICD - denies Device Orders - na Rep Notified - na  Chest x-ray - 10/03/2022 EKG - 02/25/2023 Stress Test - 03/16/19 ECHO - 02/25/2019 Cardiac Cath -   Sleep Study - denies CPAP - na  Non-diabetic  Blood Thinner Instructions:denies Aspirin Instructions:denies  ERAS Protcol -Ensure until 0430  Anesthesia review: Yes, A-fib, PVC, tachy, HTN  Patient denies shortness of breath, fever, cough and chest pain at PAT appointment.  Was seen at [facility] 2/6 with cough.  Was seen again 2/11 with bronchitis.  States all symptoms are gone, since 06/13/2023   All instructions explained to the patient, with a verbal understanding of the material. Patient agrees to go over the instructions while at home for a better understanding. Patient also instructed to self quarantine after being tested for COVID-19. The opportunity to ask questions was provided.

## 2023-06-30 NOTE — Progress Notes (Signed)
 Anesthesia Chart Review:  33 year old female follows with cardiology for history of  atypical chest pain with negative ETT in 02/2019, palpitations with PACs (3.9% burden) on monitor in 05/2020. Patient has a history of intermittent atypical chest pain and palpitations. Echo in 01/2019 showed LVEF of 60-655 with normal wall motion and no significant valvular disease. ETT in 02/2019 was negative for ischemia. Monitor in 05/2020 showed predominant sinus rhythm with PACs (3.9% burden) and rare PVCs. There were 20 patient triggered events associated with sinus rhythm and sinus tachycardia as well as PACs.  Last seen by Marjie Skiff, PA-C on 02/25/2023.  Stable at that time, recommended continue Toprol-XL 25 mg daily, follow-up in 1 year.  Other pertinent history includes asthma, GERD.  Preop labs reviewed, unremarkable.  EKG 02/25/2023: NSR.  Rate 74.  Monitor 06/16/2020 to 06/22/2020: Patient had a min HR of 55 bpm, max HR of 172 bpm, and avg HR of 87 bpm. Predominant underlying rhythm was Sinus Rhythm. Isolated SVEs were occasional (3.9%, [id number]), SVE Couplets were rare (<1.0%, 13), and SVE Triplets were rare (<1.0%, 3). Isolated VEs were rare (<1.0%), and no VE Couplets or VE Triplets were present.   There were 20 patient triggered events associated with sinus rhythm and sinus tachycardia (69-117 bpm). PACs also were noted during the patient triggered events. There were 11 diary events recorded, but the time was not recorded and they were unable to be associated with the monitor results.    Impression: 1. Occasional PACs (3.9% burden). 2. Symptoms occurred with PACs.  3. No sustained arrhythmias.   Echocardiogram 02/25/2019: Impressions: 1. Left ventricular ejection fraction, by visual estimation, is 60 to  65%. The left ventricle has normal function. There is no left ventricular  hypertrophy.   2. Global right ventricle has normal systolic function.The right  ventricular size is normal. No  increase in right ventricular wall  thickness.   3. Left atrial size was normal.   4. Right atrial size was normal.   5. The mitral valve is normal in structure. Trace mitral valve  regurgitation. No evidence of mitral stenosis.   6. The tricuspid valve is normal in structure. Tricuspid valve  regurgitation is trivial.   7. The aortic valve is normal in structure. Aortic valve regurgitation is  not visualized.   8. The pulmonic valve was normal in structure. Pulmonic valve  regurgitation is not visualized.   9. The atrial septum is grossly normal.    Exercise Tolerance Test 03/16/2019: Blood pressure demonstrated a normal response to exercise. There was no ST segment deviation noted during stress. No T wave inversion was noted during stress. The patient experienced no angina during the stress test. Overall, the patient's exercise capacity was normal During recovery patient reported feeling very dizzy and slight chest tightness. All symptoms were resolved during the recovery period. Duke Treadmill Score: low risk   Negative stress test without evidence of ischemia at given workload.    Zannie Cove [facility] Short Stay Center/Anesthesiology Phone [phone] 06/30/2023 8:48 AM

## 2023-06-30 NOTE — Anesthesia Preprocedure Evaluation (Addendum)
 Anesthesia Evaluation  Patient identified by MRN, date of birth, ID band Patient awake    Reviewed: Allergy & Precautions, NPO status , Patient's Chart, lab work & pertinent test results  History of Anesthesia Complications Negative for: history of anesthetic complications  Airway Mallampati: II  TM Distance: >3 FB Neck ROM: Full    Dental  (+) Dental Advisory Given, Teeth Intact   Pulmonary asthma , former smoker   Pulmonary exam normal        Cardiovascular Pt. on home beta blockers Normal cardiovascular exam(-) dysrhythmias      Neuro/Psych  PSYCHIATRIC DISORDERS Anxiety Depression    negative neurological ROS     GI/Hepatic Neg liver ROS,GERD  Medicated and Controlled,,  Endo/Other    Class 3 obesity  Renal/GU negative Renal ROS     Musculoskeletal negative musculoskeletal ROS (+)    Abdominal  (+) + obese  Peds  Hematology negative hematology ROS (+)   Anesthesia Other Findings   Reproductive/Obstetrics                             Anesthesia Physical Anesthesia Plan  ASA: 3  Anesthesia Plan: General   Post-op Pain Management: Tylenol PO (pre-op)*, Regional block* and Celebrex PO (pre-op)*   Induction: Intravenous  PONV Risk Score and Plan: 3 and Treatment may vary due to age or medical condition, Ondansetron, Dexamethasone, Midazolam and Scopolamine patch - Pre-op  Airway Management Planned: Oral ETT  Additional Equipment: None  Intra-op Plan:   Post-operative Plan: Extubation in OR  Informed Consent: I have reviewed the patients History and Physical, chart, labs and discussed the procedure including the risks, benefits and alternatives for the proposed anesthesia with the patient or authorized representative who has indicated his/her understanding and acceptance.     Dental advisory given  Plan Discussed with: CRNA and Anesthesiologist  Anesthesia Plan Comments:  (PAT note by Antionette Poles, PA-C)        Anesthesia Quick Evaluation

## 2023-07-01 ENCOUNTER — Ambulatory Visit (HOSPITAL_COMMUNITY)

## 2023-07-01 ENCOUNTER — Ambulatory Visit (HOSPITAL_BASED_OUTPATIENT_CLINIC_OR_DEPARTMENT_OTHER): Payer: Self-pay | Admitting: Anesthesiology

## 2023-07-01 ENCOUNTER — Other Ambulatory Visit: Payer: Self-pay

## 2023-07-01 ENCOUNTER — Encounter (HOSPITAL_COMMUNITY): Payer: Self-pay | Admitting: General Surgery

## 2023-07-01 ENCOUNTER — Encounter: Payer: Self-pay | Admitting: Physical Therapy

## 2023-07-01 ENCOUNTER — Ambulatory Visit (HOSPITAL_COMMUNITY)
Admission: RE | Admit: 2023-07-01 | Discharge: 2023-07-01 | Disposition: A | Discharge status: Home / Self Care | Payer: Commercial Managed Care - PPO | Attending: General Surgery | Admitting: General Surgery

## 2023-07-01 ENCOUNTER — Encounter (HOSPITAL_COMMUNITY)
Admission: RE | Disposition: A | Discharge status: Home / Self Care | Payer: Self-pay | Source: Home / Self Care | Attending: General Surgery

## 2023-07-01 ENCOUNTER — Ambulatory Visit (HOSPITAL_COMMUNITY): Payer: Self-pay | Admitting: Physician Assistant

## 2023-07-01 DIAGNOSIS — K8010 Calculus of gallbladder with chronic cholecystitis without obstruction: Secondary | ICD-10-CM | POA: Diagnosis not present

## 2023-07-01 DIAGNOSIS — I491 Atrial premature depolarization: Secondary | ICD-10-CM | POA: Diagnosis not present

## 2023-07-01 DIAGNOSIS — I4891 Unspecified atrial fibrillation: Secondary | ICD-10-CM | POA: Diagnosis not present

## 2023-07-01 DIAGNOSIS — J45909 Unspecified asthma, uncomplicated: Secondary | ICD-10-CM | POA: Insufficient documentation

## 2023-07-01 DIAGNOSIS — Z6841 Body Mass Index (BMI) 40.0 and over, adult: Secondary | ICD-10-CM | POA: Diagnosis not present

## 2023-07-01 DIAGNOSIS — K8020 Calculus of gallbladder without cholecystitis without obstruction: Secondary | ICD-10-CM

## 2023-07-01 DIAGNOSIS — Z79899 Other long term (current) drug therapy: Secondary | ICD-10-CM | POA: Insufficient documentation

## 2023-07-01 DIAGNOSIS — E66813 Obesity, class 3: Secondary | ICD-10-CM | POA: Diagnosis not present

## 2023-07-01 DIAGNOSIS — I493 Ventricular premature depolarization: Secondary | ICD-10-CM | POA: Diagnosis not present

## 2023-07-01 DIAGNOSIS — Z87891 Personal history of nicotine dependence: Secondary | ICD-10-CM | POA: Insufficient documentation

## 2023-07-01 DIAGNOSIS — G8918 Other acute postprocedural pain: Secondary | ICD-10-CM | POA: Diagnosis not present

## 2023-07-01 DIAGNOSIS — K8050 Calculus of bile duct without cholangitis or cholecystitis without obstruction: Secondary | ICD-10-CM | POA: Diagnosis not present

## 2023-07-01 DIAGNOSIS — K219 Gastro-esophageal reflux disease without esophagitis: Secondary | ICD-10-CM | POA: Insufficient documentation

## 2023-07-01 HISTORY — PX: CHOLECYSTECTOMY: SHX55

## 2023-07-01 LAB — POCT PREGNANCY, URINE: Preg Test, Ur: NEGATIVE

## 2023-07-01 SURGERY — LAPAROSCOPIC CHOLECYSTECTOMY
Anesthesia: General | Site: Abdomen

## 2023-07-01 MED ORDER — BUPIVACAINE-EPINEPHRINE 0.25% -1:200000 IJ SOLN
INTRAMUSCULAR | Status: DC | PRN
  Administered 2023-07-01: 12 mL

## 2023-07-01 MED ORDER — OXYCODONE HCL 5 MG PO TABS
ORAL_TABLET | ORAL | Status: AC
  Filled 2023-07-01: qty 1

## 2023-07-01 MED ORDER — OXYCODONE HCL 5 MG PO TABS
5.0000 mg | ORAL_TABLET | Freq: Four times a day (QID) | ORAL | 0 refills | Status: AC | PRN
  Filled 2023-07-01: qty 10, 3d supply, fill #0

## 2023-07-01 MED ORDER — SODIUM CHLORIDE 0.9 % IV SOLN: 250.0000 mL | INTRAVENOUS | Status: DC | PRN

## 2023-07-01 MED ORDER — FENTANYL CITRATE (PF) 250 MCG/5ML IJ SOLN
INTRAMUSCULAR | Status: DC | PRN
Start: 2023-07-01 — End: 2023-07-01
  Administered 2023-07-01: 100 ug via INTRAVENOUS
  Administered 2023-07-01 (×3): 50 ug via INTRAVENOUS

## 2023-07-01 MED ORDER — LACTATED RINGERS IV SOLN: INTRAVENOUS | Status: DC | PRN

## 2023-07-01 MED ORDER — ACETAMINOPHEN 500 MG PO TABS
1000.0000 mg | ORAL_TABLET | ORAL | Status: AC
  Administered 2023-07-01: 1000 mg via ORAL

## 2023-07-01 MED ORDER — CELECOXIB 200 MG PO CAPS
200.0000 mg | ORAL_CAPSULE | Freq: Once | ORAL | Status: AC
  Administered 2023-07-01: 200 mg via ORAL
  Filled 2023-07-01: qty 1

## 2023-07-01 MED ORDER — FENTANYL CITRATE (PF) 100 MCG/2ML IJ SOLN
INTRAMUSCULAR | Status: AC
Start: 2023-07-01 — End: 2023-07-01
  Filled 2023-07-01: qty 2

## 2023-07-01 MED ORDER — SCOPOLAMINE 1 MG/3DAYS TD PT72
1.0000 | MEDICATED_PATCH | TRANSDERMAL | Status: DC
  Administered 2023-07-01: 1.5 mg via TRANSDERMAL
  Filled 2023-07-01: qty 1

## 2023-07-01 MED ORDER — BUPIVACAINE-EPINEPHRINE (PF) 0.25% -1:200000 IJ SOLN
INTRAMUSCULAR | Status: AC
  Filled 2023-07-01: qty 30

## 2023-07-01 MED ORDER — LIDOCAINE 2% (20 MG/ML) 5 ML SYRINGE
INTRAMUSCULAR | Status: AC
  Filled 2023-07-01: qty 5

## 2023-07-01 MED ORDER — ACETAMINOPHEN 500 MG PO TABS
1000.0000 mg | ORAL_TABLET | Freq: Once | ORAL | Status: DC
  Filled 2023-07-01: qty 2

## 2023-07-01 MED ORDER — PROPOFOL 10 MG/ML IV BOLUS
INTRAVENOUS | Status: AC
  Filled 2023-07-01: qty 20

## 2023-07-01 MED ORDER — ROCURONIUM BROMIDE 10 MG/ML (PF) SYRINGE
PREFILLED_SYRINGE | INTRAVENOUS | Status: DC | PRN
Start: 2023-07-01 — End: 2023-07-01
  Administered 2023-07-01: 50 mg via INTRAVENOUS

## 2023-07-01 MED ORDER — CHLORHEXIDINE GLUCONATE CLOTH 2 % EX PADS: 6.0000 | MEDICATED_PAD | Freq: Once | CUTANEOUS | Status: DC

## 2023-07-01 MED ORDER — SODIUM CHLORIDE 0.9 % IV SOLN
12.5000 mg | INTRAVENOUS | Status: DC | PRN
  Filled 2023-07-01: qty 0.5

## 2023-07-01 MED ORDER — MIDAZOLAM HCL 2 MG/2ML IJ SOLN
INTRAMUSCULAR | Status: DC | PRN
Start: 2023-07-01 — End: 2023-07-01
  Administered 2023-07-01: 2 mg via INTRAVENOUS

## 2023-07-01 MED ORDER — MIDAZOLAM HCL 2 MG/2ML IJ SOLN
INTRAMUSCULAR | Status: AC
  Filled 2023-07-01: qty 2

## 2023-07-01 MED ORDER — ACETAMINOPHEN 325 MG PO TABS: 650.0000 mg | ORAL_TABLET | ORAL | Status: DC | PRN

## 2023-07-01 MED ORDER — ONDANSETRON HCL 4 MG/2ML IJ SOLN
INTRAMUSCULAR | Status: DC | PRN
  Administered 2023-07-01: 4 mg via INTRAVENOUS

## 2023-07-01 MED ORDER — ACETAMINOPHEN 650 MG RE SUPP: 650.0000 mg | RECTAL | Status: DC | PRN

## 2023-07-01 MED ORDER — BUPIVACAINE LIPOSOME 1.3 % IJ SUSP
INTRAMUSCULAR | Status: AC
  Filled 2023-07-01: qty 20

## 2023-07-01 MED ORDER — OXYCODONE HCL 5 MG PO TABS
5.0000 mg | ORAL_TABLET | Freq: Once | ORAL | Status: AC | PRN
  Administered 2023-07-01: 5 mg via ORAL

## 2023-07-01 MED ORDER — CEFAZOLIN SODIUM-DEXTROSE 2-4 GM/100ML-% IV SOLN
2.0000 g | INTRAVENOUS | Status: AC
  Administered 2023-07-01: 2 g via INTRAVENOUS

## 2023-07-01 MED ORDER — CHLORHEXIDINE GLUCONATE 0.12 % MT SOLN
OROMUCOSAL | Status: AC
  Administered 2023-07-01: 15 mL via OROMUCOSAL
  Filled 2023-07-01: qty 15

## 2023-07-01 MED ORDER — BUPIVACAINE LIPOSOME 1.3 % IJ SUSP
INTRAMUSCULAR | Status: DC | PRN
Start: 2023-07-01 — End: 2023-07-01
  Administered 2023-07-01 (×2): 10 mL

## 2023-07-01 MED ORDER — 0.9 % SODIUM CHLORIDE (POUR BTL) OPTIME
TOPICAL | Status: DC | PRN
  Administered 2023-07-01: 1000 mL

## 2023-07-01 MED ORDER — ROCURONIUM BROMIDE 10 MG/ML (PF) SYRINGE
PREFILLED_SYRINGE | INTRAVENOUS | Status: AC
  Filled 2023-07-01: qty 10

## 2023-07-01 MED ORDER — INDOCYANINE GREEN 25 MG IV SOLR
1.2500 mg | Freq: Once | INTRAVENOUS | Status: DC
  Filled 2023-07-01: qty 10

## 2023-07-01 MED ORDER — AMISULPRIDE (ANTIEMETIC) 5 MG/2ML IV SOLN: 10.0000 mg | Freq: Once | INTRAVENOUS | Status: DC | PRN

## 2023-07-01 MED ORDER — SODIUM CHLORIDE 0.9% FLUSH: 3.0000 mL | INTRAVENOUS | Status: DC | PRN

## 2023-07-01 MED ORDER — OXYCODONE HCL 5 MG PO TABS: 5.0000 mg | ORAL_TABLET | ORAL | Status: DC | PRN

## 2023-07-01 MED ORDER — ONDANSETRON HCL 4 MG/2ML IJ SOLN
INTRAMUSCULAR | Status: AC
  Filled 2023-07-01: qty 2

## 2023-07-01 MED ORDER — FENTANYL CITRATE (PF) 100 MCG/2ML IJ SOLN
25.0000 ug | INTRAMUSCULAR | Status: DC | PRN
  Administered 2023-07-01 (×3): 50 ug via INTRAVENOUS

## 2023-07-01 MED ORDER — DEXMEDETOMIDINE HCL IN NACL 80 MCG/20ML IV SOLN
INTRAVENOUS | Status: DC | PRN
  Administered 2023-07-01: 8 ug via INTRAVENOUS
  Administered 2023-07-01: 4 ug via INTRAVENOUS

## 2023-07-01 MED ORDER — PROPOFOL 10 MG/ML IV BOLUS
INTRAVENOUS | Status: DC | PRN
  Administered 2023-07-01: 200 mg via INTRAVENOUS

## 2023-07-01 MED ORDER — ORAL CARE MOUTH RINSE: 15.0000 mL | Freq: Once | OROMUCOSAL | Status: AC

## 2023-07-01 MED ORDER — HYDROMORPHONE HCL 1 MG/ML IJ SOLN
INTRAMUSCULAR | Status: DC | PRN
  Administered 2023-07-01: .5 mg via INTRAVENOUS

## 2023-07-01 MED ORDER — FENTANYL CITRATE (PF) 250 MCG/5ML IJ SOLN
INTRAMUSCULAR | Status: AC
  Filled 2023-07-01: qty 5

## 2023-07-01 MED ORDER — OXYCODONE HCL 5 MG/5ML PO SOLN: 5.0000 mg | Freq: Once | ORAL | Status: AC | PRN

## 2023-07-01 MED ORDER — SUGAMMADEX SODIUM 200 MG/2ML IV SOLN
INTRAVENOUS | Status: DC | PRN
  Administered 2023-07-01: 200 mg via INTRAVENOUS

## 2023-07-01 MED ORDER — SODIUM CHLORIDE 0.9 % IR SOLN
Status: DC | PRN
  Administered 2023-07-01: 1000 mL

## 2023-07-01 MED ORDER — FENTANYL CITRATE (PF) 100 MCG/2ML IJ SOLN
INTRAMUSCULAR | Status: AC
  Filled 2023-07-01: qty 2

## 2023-07-01 MED ORDER — DEXAMETHASONE SODIUM PHOSPHATE 10 MG/ML IJ SOLN
INTRAMUSCULAR | Status: AC
  Filled 2023-07-01: qty 1

## 2023-07-01 MED ORDER — CHLORHEXIDINE GLUCONATE 0.12 % MT SOLN: 15.0000 mL | Freq: Once | OROMUCOSAL | Status: AC

## 2023-07-01 MED ORDER — CEFAZOLIN SODIUM-DEXTROSE 2-4 GM/100ML-% IV SOLN
INTRAVENOUS | Status: AC
  Filled 2023-07-01: qty 100

## 2023-07-01 MED ORDER — DEXAMETHASONE SODIUM PHOSPHATE 10 MG/ML IJ SOLN
INTRAMUSCULAR | Status: DC | PRN
  Administered 2023-07-01: 10 mg via INTRAVENOUS

## 2023-07-01 MED ORDER — INDOCYANINE GREEN 25 MG IV SOLR
INTRAVENOUS | Status: DC | PRN
  Administered 2023-07-01: 1.25 mg via INTRAVENOUS

## 2023-07-01 MED ORDER — BUPIVACAINE HCL (PF) 0.25 % IJ SOLN
INTRAMUSCULAR | Status: DC | PRN
  Administered 2023-07-01 (×2): 15 mL via EPIDURAL

## 2023-07-01 MED ORDER — ENSURE PRE-SURGERY PO LIQD: 296.0000 mL | Freq: Once | ORAL | Status: DC

## 2023-07-01 MED ORDER — HYDROMORPHONE HCL 1 MG/ML IJ SOLN
INTRAMUSCULAR | Status: AC
  Filled 2023-07-01: qty 0.5

## 2023-07-01 MED ORDER — LIDOCAINE 2% (20 MG/ML) 5 ML SYRINGE
INTRAMUSCULAR | Status: DC | PRN
Start: 2023-07-01 — End: 2023-07-01
  Administered 2023-07-01: 60 mg via INTRAVENOUS

## 2023-07-01 SURGICAL SUPPLY — 41 items
APPLIER CLIP 5 13 M/L LIGAMAX5 (MISCELLANEOUS) ×2 IMPLANT
BAG COUNTER SPONGE SURGICOUNT (BAG) ×3 IMPLANT
BLADE CLIPPER SURG (BLADE) IMPLANT
CANISTER SUCT 3000ML PPV (MISCELLANEOUS) ×3 IMPLANT
CHLORAPREP W/TINT 26 (MISCELLANEOUS) ×3 IMPLANT
CLIP APPLIE 5 13 M/L LIGAMAX5 (MISCELLANEOUS) ×3 IMPLANT
CLSR STERI-STRIP ANTIMIC 1/2X4 (GAUZE/BANDAGES/DRESSINGS) IMPLANT
COVER MAYO STAND STRL (DRAPES) IMPLANT
COVER SURGICAL LIGHT HANDLE (MISCELLANEOUS) ×3 IMPLANT
DERMABOND ADVANCED .7 DNX12 (GAUZE/BANDAGES/DRESSINGS) ×3 IMPLANT
DERMABOND ADVANCED .7 DNX6 (GAUZE/BANDAGES/DRESSINGS) IMPLANT
DRAPE C-ARM 42X120 X-RAY (DRAPES) IMPLANT
ELECT REM PT RETURN 9FT ADLT (ELECTROSURGICAL) ×2 IMPLANT
ELECTRODE REM PT RTRN 9FT ADLT (ELECTROSURGICAL) ×3 IMPLANT
GLOVE BIO SURGEON STRL SZ7 (GLOVE) ×3 IMPLANT
GLOVE BIOGEL PI IND STRL 7.5 (GLOVE) ×3 IMPLANT
GOWN STRL REUS W/ TWL LRG LVL3 (GOWN DISPOSABLE) ×9 IMPLANT
GRASPER SUT TROCAR 14GX15 (MISCELLANEOUS) ×3 IMPLANT
IRRIG SUCT STRYKERFLOW 2 WTIP (MISCELLANEOUS) ×2 IMPLANT
IRRIGATION SUCT STRKRFLW 2 WTP (MISCELLANEOUS) ×3 IMPLANT
KIT BASIN OR (CUSTOM PROCEDURE TRAY) ×3 IMPLANT
KIT IMAGING PINPOINTPAQ (MISCELLANEOUS) IMPLANT
KIT TURNOVER KIT B (KITS) ×3 IMPLANT
NS IRRIG 1000ML POUR BTL (IV SOLUTION) ×3 IMPLANT
PAD ARMBOARD 7.5X6 YLW CONV (MISCELLANEOUS) ×3 IMPLANT
POUCH RETRIEVAL ECOSAC 10 (ENDOMECHANICALS) ×3 IMPLANT
SCISSORS LAP 5X35 DISP (ENDOMECHANICALS) ×3 IMPLANT
SET CHOLANGIOGRAPH 5 50 .035 (SET/KITS/TRAYS/PACK) IMPLANT
SET TUBE SMOKE EVAC HIGH FLOW (TUBING) ×3 IMPLANT
SLEEVE Z-THREAD 5X100MM (TROCAR) ×6 IMPLANT
SPECIMEN JAR SMALL (MISCELLANEOUS) ×3 IMPLANT
STRIP CLOSURE SKIN 1/2X4 (GAUZE/BANDAGES/DRESSINGS) ×3 IMPLANT
SUT MNCRL AB 4-0 PS2 18 (SUTURE) ×3 IMPLANT
SUT VICRYL 0 UR6 27IN ABS (SUTURE) ×3 IMPLANT
TOWEL GREEN STERILE (TOWEL DISPOSABLE) ×3 IMPLANT
TOWEL GREEN STERILE FF (TOWEL DISPOSABLE) ×3 IMPLANT
TRAY LAPAROSCOPIC MC (CUSTOM PROCEDURE TRAY) ×3 IMPLANT
TROCAR BALLN 12MMX100 BLUNT (TROCAR) ×3 IMPLANT
TROCAR Z-THREAD OPTICAL 5X100M (TROCAR) ×3 IMPLANT
WARMER LAPAROSCOPE (MISCELLANEOUS) ×3 IMPLANT
WATER STERILE IRR 1000ML POUR (IV SOLUTION) ×3 IMPLANT

## 2023-07-01 NOTE — Op Note (Signed)
   Preoperative diagnosis: Biliary colic Postoperative diagnosis: saa Procedure: Laparoscopic cholecystectomy Surgeon: Dr. Harden Mo Estimated blood loss: Less than 50 cc Specimens: Gallbladder and contents to pathology Complications: None Drains: None Anesthesia: General Sponge needle count correct at completion Disposition to recovery stable addition   Indications: 50 yof with upper abdominal pain and gallstones on imaging. She appears to have symptomatic cholelithiasis. We discussed lap chole.    Procedure: After informed consent was obtained she was taken to the operating room.  She was already on antibiotics.  She was given Ancef immediately preoperatively.  She had SCDs in place.  She was placed under general anesthesia without complication.  She was prepped and draped in a standard sterile surgical fashion.  Surgical timeout was then performed.   I infiltrated Marcaine below the umbilicus and made a vertical incision.  I grasped the fascia and incised it. I entered the peritoneum bluntly. This was done without injury.  I then inserted a Hassan trocar after placing a 0 Vicryl pursestring suture.  The abdomen was insufflated 15 mmHg pressure.  I then inserted three additional 5 mm trocars under direct vision. The gallbladder was retracted cephalad and lateral  I was able to dissect and get a critical view of safety.  I clearly saw both the artery and duct and there was a wide window behind that.  I also used the ICG dye to identify the common duct and cystic duct and this confirmed my anatomy.  The ICG filled the duodenum. I was going to attempt a cholangiogram but the duct was fairly short and inflamed so I did not do this.I clipped the artery three times and divided it leaving two in place. .  I then clipped the duct 3 times and divided it. The clips completely traversed the duct and the duct was viable.  The gallbladder was then removed from the liver bed without difficulty.  This was  placed in retrieval bag and removed from the umbilical incision.  Hemostasis was observed.   I then removed the [facility] trocar and tied my pursestring down.  I placed another 0 Vicryl suture with using the suture passer device to completely obliterate this defect.  I then removed the remaining trocars and desufflated the abdomen.  These were closed with 4-0 Monocryl and glue.  She tolerated this well was extubated and transferred recovery stable.

## 2023-07-01 NOTE — Discharge Instructions (Signed)

## 2023-07-01 NOTE — Anesthesia Procedure Notes (Signed)
 Anesthesia Regional Block: TAP block   Pre-Anesthetic Checklist: , timeout performed,  Correct Patient, Correct Site, Correct Laterality,  Correct Procedure, Correct Position, site marked,  Risks and benefits discussed,  Surgical consent,  Pre-op evaluation,  At surgeon's request and post-op pain management  Laterality: Left  Prep: chloraprep       Needles:  Injection technique: Single-shot  Needle Type: Echogenic Needle     Needle Length: 10cm  Needle Gauge: 21     Additional Needles:   Narrative:  Start time: 07/01/2023 7:14 AM End time: 07/01/2023 7:17 AM Injection made incrementally with aspirations every 5 mL.  Performed by: Personally  Anesthesiologist: Beryle Lathe, MD  Additional Notes: No pain on injection. No increased resistance to injection. Injection made in 5cc increments. Good needle visualization. Patient tolerated the procedure well.

## 2023-07-01 NOTE — Interval H&P Note (Signed)
 History and Physical Interval Note:  07/01/2023 6:57 AM  Theresa Ortiz  has presented today for surgery, with the diagnosis of GALLSTONES.  The various methods of treatment have been discussed with the patient and family. After consideration of risks, benefits and other options for treatment, the patient has consented to  Procedure(s) with comments: LAPAROSCOPIC CHOLECYSTECTOMY WITH INTRAOPERATIVE CHOLANGIOGRAM, INDOCYANINE GREEN DYE (N/A) - GENERAL & TAP BLOCK INDOCYANINE GREEN FLUORESCENCE IMAGING (ICG) (N/A) as a surgical intervention.  The patient's history has been reviewed, patient examined, no change in status, stable for surgery.  I have reviewed the patient's chart and labs.  Questions were answered to the patient's satisfaction.     Theresa Ortiz

## 2023-07-01 NOTE — Transfer of Care (Signed)
 Immediate Anesthesia Transfer of Care Note  Patient: Theresa Ortiz  Procedure(s) Performed: LAPAROSCOPIC CHOLECYSTECTOMY (Abdomen) INDOCYANINE GREEN FLUORESCENCE IMAGING (ICG)  Patient Location: PACU  Anesthesia Type:General  Level of Consciousness: awake, alert , and oriented  Airway & Oxygen Therapy: Patient Spontanous Breathing  Post-op Assessment: Report given to RN, Post -op Vital signs reviewed and stable, and Patient moving all extremities X 4  Post vital signs: Reviewed and stable  Last Vitals:  Vitals Value Taken Time  BP 127/88 07/01/23 0837  Temp    Pulse 71 07/01/23 0840  Resp 10 07/01/23 0840  SpO2 92 % 07/01/23 0840  Vitals shown include unfiled device data.  Last Pain:  Vitals:   07/01/23 0634  TempSrc:   PainSc: 0-No pain         Complications: There were no known notable events for this encounter.

## 2023-07-01 NOTE — Anesthesia Procedure Notes (Addendum)

## 2023-07-01 NOTE — Anesthesia Postprocedure Evaluation (Signed)
 Anesthesia Post Note  Patient: Theresa Ortiz  Procedure(s) Performed: LAPAROSCOPIC CHOLECYSTECTOMY (Abdomen) INDOCYANINE GREEN FLUORESCENCE IMAGING (ICG)     Patient location during evaluation: PACU Anesthesia Type: General Level of consciousness: awake and alert Pain management: pain level controlled Vital Signs Assessment: post-procedure vital signs reviewed and stable Respiratory status: spontaneous breathing, nonlabored ventilation and respiratory function stable Cardiovascular status: stable and blood pressure returned to baseline Anesthetic complications: no   There were no known notable events for this encounter.  Last Vitals:  Vitals:   07/01/23 0900 07/01/23 0915  BP: 116/76 117/75  Pulse: 67 65  Resp: 19 18  Temp:  36.6 C  SpO2: 94% 94%    Last Pain:  Vitals:   07/01/23 0915  TempSrc:   PainSc: 4                  Beryle Lathe

## 2023-07-01 NOTE — Anesthesia Procedure Notes (Signed)
 Anesthesia Regional Block: TAP block   Pre-Anesthetic Checklist: , timeout performed,  Correct Patient, Correct Site, Correct Laterality,  Correct Procedure, Correct Position, site marked,  Risks and benefits discussed,  Surgical consent,  Pre-op evaluation,  At surgeon's request and post-op pain management  Laterality: Right  Prep: chloraprep       Needles:  Injection technique: Single-shot  Needle Type: Echogenic Needle     Needle Length: 10cm  Needle Gauge: 21     Additional Needles:   Narrative:  Start time: 07/01/2023 7:17 AM End time: 07/01/2023 7:20 AM Injection made incrementally with aspirations every 5 mL.  Performed by: Personally  Anesthesiologist: Beryle Lathe, MD  Additional Notes: No pain on injection. No increased resistance to injection. Injection made in 5cc increments. Good needle visualization. Patient tolerated the procedure well.

## 2023-07-01 NOTE — H&P (Signed)
 33 y.o. 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 The abdominal pain, initially mistaken for indigestion, is sharp and located in the upper abdomen. The patient has experienced multiple flare-ups of this pain, particularly when she has been ill with pneumonia or bronchitis. These flare-ups are characterized by severe pain that lasts for about an hour  In addition to the abdominal pain, the patient also experiences bowel issues. Immediately after eating, the patient feels the need to defecate. The patient reports varying stool consistencies and colors, including green stool. Recently, the patient experienced a week of bloating and constant pain, which led her to seek a consultation for gallbladder removal.  The patient has been diagnosed with gallstones and has been advised to have her gallbladder removed. However, the patient is hesitant about the surgery due to fear of post-operative bowel issues. The patient also has a history of PVCs and PACs, for which she takes metoprolol.  Review of Systems  Constitutional: Positive for chills and fever.  Cardiovascular: Positive for chest pain and palpitations.  Gastrointestinal: Positive for abdominal pain, constipation, diarrhea and nausea.  Hematological: Bruises/bleeds easily.   Patient Active Problem List  Diagnosis  Asthma (HHS-HCC)   Outpatient Medications Prior to Visit  Medication Sig Dispense Refill  dextroamphetamine-amphetamine (ADDERALL XR) 20 MG XR capsule Take 20 mg by mouth once daily  famotidine (PEPCID) 10 MG tablet Take 10 mg by mouth 2 (two) times daily  FLUoxetine (PROZAC) 10 MG capsule Take 10 mg by mouth once daily  folic acid (FOLVITE) 400 MCG tablet Take 400 mcg by mouth once daily  metoprolol succinate (TOPROL-XL) 25 MG XL tablet Take 0.5 tablets by mouth every 12 (twelve) hours as  needed  albuterol 90 mcg/actuation inhaler 1 puff as needed (Patient not taking: Reported on 05/23/2023)  gabapentin (NEURONTIN) 300 MG capsule Take 1 capsule (300 mg total) by mouth at bedtime 1 po qHS x 4 days, then bid x 4 days, then tid (Patient not taking: Reported on 05/23/2023) 90 capsule 5  lidocaine (LIDODERM) 5 % patch (Patient not taking: Reported on 05/23/2023)  omeprazole (PRILOSEC) 40 MG DR capsule Take 1 capsule by mouth once daily (Patient not taking: Reported on 05/23/2023)   No facility-administered medications prior to visit.     Objective   Vitals:  05/23/23 0950  BP: 111/79  Pulse: 75  Temp: 36.8 C (98.2 F)  SpO2: 97%  Weight: (!) 116.9 kg (257 lb 12.8 oz)  Height: 165.1 cm (5\' 5" )  PainSc: 0-No pain   Body mass index is 42.9 kg/m.    Physical Exam Vitals reviewed.  Constitutional:  Appearance: Normal appearance.  Abdominal:  Tenderness: There is abdominal tenderness (very mild) in the right upper quadrant. Negative signs include Murphy's sign.  Hernia: No hernia is present.  Neurological:  Mental Status: She is alert.   Physical Exam  Results   Assessment/Plan:   Assessment & Plan Cholelithiasis Recurrent episodes of severe abdominal pain, suggestive of gallstone passage. Elevated liver enzymes and history of gallstones on ultrasound. Risk of gallstone causing obstruction and potential for severe illness discussed. -Plan for laparoscopic cholecystectomy. -Perform intraoperative cholangiogram due to abnormal liver function tests. -I discussed the procedure in detail. We discussed the risks and benefits of a laparoscopic cholecystectomy and possible cholangiogram including, but not limited to bleeding, infection, injury to surrounding structures such as the intestine  or liver, bile leak, retained gallstones, need to convert to an open procedure, prolonged diarrhea, blood clots such as DVT, common bile duct injury, anesthesia risks, and possible need  for additional procedures. The likelihood of improvement in symptoms and return to the patient's normal status is good. We discussed the typical post-operative recovery course.  Gastrointestinal symptoms Frequent bowel movements of varying consistency and color following meals. Possible relation to gallbladder disease discussed. -Monitor symptoms post-cholecystectomy.  Premature Ventricular Contractions (PVCs) and Premature Atrial Contractions (PACs) Managed with Metoprolol. Follow-up with cardiologist Dr. Stephenie Acres. -Continue Metoprolol as prescribed.  Follow-up Schedule post-operative appointment for 2-3 weeks after surgery.

## 2023-07-02 ENCOUNTER — Encounter (HOSPITAL_COMMUNITY): Payer: Self-pay | Admitting: General Surgery

## 2023-07-02 LAB — SURGICAL PATHOLOGY

## 2023-07-03 ENCOUNTER — Encounter: Payer: Self-pay | Admitting: Physical Therapy

## 2023-07-04 ENCOUNTER — Other Ambulatory Visit: Payer: Self-pay

## 2023-07-04 DIAGNOSIS — R82998 Other abnormal findings in urine: Secondary | ICD-10-CM | POA: Diagnosis not present

## 2023-07-04 MED ORDER — AMPHETAMINE-DEXTROAMPHET ER 20 MG PO CP24
20.0000 mg | ORAL_CAPSULE | Freq: Every day | ORAL | 0 refills | Status: DC
  Filled 2023-07-04: qty 30, 30d supply, fill #0

## 2023-07-04 MED ORDER — ALBUTEROL SULFATE HFA 108 (90 BASE) MCG/ACT IN AERS
2.0000 | INHALATION_SPRAY | RESPIRATORY_TRACT | 3 refills | Status: AC | PRN
  Filled 2023-07-04: qty 20.1, 90d supply, fill #0

## 2023-07-07 ENCOUNTER — Other Ambulatory Visit: Payer: Self-pay

## 2023-07-08 ENCOUNTER — Encounter: Payer: Self-pay | Admitting: Physical Therapy

## 2023-07-10 ENCOUNTER — Encounter: Payer: Self-pay | Admitting: Physical Therapy

## 2023-07-14 ENCOUNTER — Other Ambulatory Visit: Payer: Self-pay

## 2023-07-14 DIAGNOSIS — H019 Unspecified inflammation of eyelid: Secondary | ICD-10-CM | POA: Diagnosis not present

## 2023-07-14 DIAGNOSIS — L719 Rosacea, unspecified: Secondary | ICD-10-CM | POA: Diagnosis not present

## 2023-07-14 MED ORDER — HYDROCORTISONE 2.5 % EX OINT
1.0000 | TOPICAL_OINTMENT | Freq: Two times a day (BID) | CUTANEOUS | 0 refills | Status: AC
Start: 2023-07-14 — End: ?
  Filled 2023-07-14: qty 28.35, 14d supply, fill #0

## 2023-07-21 ENCOUNTER — Encounter: Payer: Self-pay | Admitting: Podiatry

## 2023-07-21 ENCOUNTER — Ambulatory Visit: Admitting: Podiatry

## 2023-07-21 ENCOUNTER — Ambulatory Visit (INDEPENDENT_AMBULATORY_CARE_PROVIDER_SITE_OTHER)

## 2023-07-21 DIAGNOSIS — M25373 Other instability, unspecified ankle: Secondary | ICD-10-CM | POA: Diagnosis not present

## 2023-07-21 DIAGNOSIS — Z029 Encounter for administrative examinations, unspecified: Secondary | ICD-10-CM | POA: Insufficient documentation

## 2023-07-21 DIAGNOSIS — M7751 Other enthesopathy of right foot: Secondary | ICD-10-CM

## 2023-07-21 DIAGNOSIS — R718 Other abnormality of red blood cells: Secondary | ICD-10-CM | POA: Insufficient documentation

## 2023-07-21 DIAGNOSIS — D72829 Elevated white blood cell count, unspecified: Secondary | ICD-10-CM | POA: Insufficient documentation

## 2023-07-21 DIAGNOSIS — R748 Abnormal levels of other serum enzymes: Secondary | ICD-10-CM | POA: Insufficient documentation

## 2023-07-21 DIAGNOSIS — R7689 Other specified abnormal immunological findings in serum: Secondary | ICD-10-CM | POA: Insufficient documentation

## 2023-07-21 DIAGNOSIS — M7752 Other enthesopathy of left foot: Secondary | ICD-10-CM

## 2023-07-21 DIAGNOSIS — R768 Other specified abnormal immunological findings in serum: Secondary | ICD-10-CM | POA: Insufficient documentation

## 2023-07-21 DIAGNOSIS — Z6841 Body Mass Index (BMI) 40.0 and over, adult: Secondary | ICD-10-CM | POA: Insufficient documentation

## 2023-07-21 DIAGNOSIS — Z8616 Personal history of COVID-19: Secondary | ICD-10-CM | POA: Insufficient documentation

## 2023-07-21 DIAGNOSIS — N3281 Overactive bladder: Secondary | ICD-10-CM | POA: Insufficient documentation

## 2023-07-21 NOTE — Progress Notes (Signed)
 Subjective:  Patient ID: Theresa Ortiz, female    DOB: [birth year],  MRN: [id number] HPI Chief Complaint  Patient presents with   Ankle Pain    Lateral ankle bilateral (R>L) - rolled ankles multiple times over the years, feels that its caused weakness and instability, pain after standing for long periods of time, tried ankle brace in the past   New Patient (Initial Visit)    33 y.o. female presents with the above complaint.   ROS: Denies fever chills nausea vomit muscle aches pains calf pain back pain chest pain shortness of breath.  Past Medical History:  Diagnosis Date   Acute meniscal tear of knee    left knee   Allergy    Anxiety    Asthma    Atrial fibrillation (HCC)    slight   COVID-19 04/2020   Depression    GERD (gastroesophageal reflux disease)    Ovarian cyst    PVC (premature ventricular contraction)    Scoliosis    Shingles    Tachycardia    Past Surgical History:  Procedure Laterality Date   CHOLECYSTECTOMY N/A 07/01/2023   Procedure: LAPAROSCOPIC CHOLECYSTECTOMY;  Surgeon: Emelia Loron, MD;  Location: [facility] OR;  Service: General;  Laterality: N/A;  GENERAL & TAP BLOCK   KNEE ARTHROSCOPY WITH LATERAL MENISECTOMY Left 09/01/2012   Procedure: LEFT KNEE ARTHROSCOPY WITH LATERAL MENISCECTOMY ;  Surgeon: Jacki Cones, MD;  Location: [facility];  Service: Orthopedics;  Laterality: Left;   MENISCUS REPAIR Right    RIGHT INDEX  FINGER TENDON REPAIR  NOV 2013    Current Outpatient Medications:    albuterol (PROVENTIL HFA) 108 (90 Base) MCG/ACT inhaler, Inhale 2 puffs into the lungs every 4 (four) hours as needed., Disp: 6.7 g, Rfl: 3   albuterol (VENTOLIN HFA) 108 (90 Base) MCG/ACT inhaler, Inhale 2 puffs into the lungs every 4 (four) hours as needed., Disp: 20.1 g, Rfl: 3   amphetamine-dextroamphetamine (ADDERALL XR) 20 MG 24 hr capsule, Take 1 capsule (20 mg total) by mouth daily on days that she works., Disp: 30 capsule, Rfl: 0    famotidine (PEPCID) 20 MG tablet, Take 1 tablet (20 mg total) by mouth 2 (two) times daily as needed for reflux (Patient taking differently: Take 20 mg by mouth daily.), Disp: 60 tablet, Rfl: 5   FLUoxetine (PROZAC) 10 MG capsule, Take 1 capsule (10 mg total) by mouth daily., Disp: 90 capsule, Rfl: 3   folic acid (FOLVITE) 1 MG tablet, Take 1 tablet (1 mg total) by mouth daily., Disp: 90 tablet, Rfl: 3   hydrocortisone 2.5 % ointment, Apply 1 Application topically 2 (two) times daily for 14 days, Disp: 28.35 g, Rfl: 0   metoprolol succinate (TOPROL-XL) 25 MG 24 hr tablet, Take 1 tablet (25 mg total) by mouth daily. TAKE WITH OR IMMEDIATELY FOLLOWING A MEAL., Disp: 90 tablet, Rfl: 3   oxyCODONE (OXY IR/ROXICODONE) 5 MG immediate release tablet, Take 1 tablet (5 mg total) by mouth every 6 (six) hours as needed., Disp: 10 tablet, Rfl: 0  Allergies  Allergen Reactions   Raspberry Swelling and Rash    TONGUE SWELLS  Other Reaction(s): itchiness; breakout   Review of Systems Objective:  There were no vitals filed for this visit.  General: Well developed, nourished, in no acute distress, alert and oriented x3   Dermatological: Skin is warm, dry and supple bilateral. Nails x 10 are well maintained; remaining integument appears unremarkable at this time. There are no  open sores, no preulcerative lesions, no rash or signs of infection present.  Vascular: Dorsalis Pedis artery and Posterior Tibial artery pedal pulses are 2/4 bilateral with immedate capillary fill time. Pedal hair growth present. No varicosities and no lower extremity edema present bilateral.   Neruologic: Grossly intact via light touch bilateral. Vibratory intact via tuning fork bilateral. Protective threshold with Semmes Wienstein monofilament intact to all pedal sites bilateral. Patellar and Achilles deep tendon reflexes 2+ bilateral. No Babinski or clonus noted bilateral.   Musculoskeletal: No gross boney pedal deformities  bilateral. No pain, crepitus, or limitation noted with foot and ankle range of motion bilateral. Muscular strength 5/5 in all groups tested bilateral.  She has pain on palpation of the anterior talofibular ligament of the right foot none on the left foot.  She also has some tenderness on palpation of the peroneal tendons and the calcaneofibular ligament right more so than the left.  Mild swelling right foot and ankle area none on the left.  Gait: Unassisted, Nonantalgic.    Radiographs:  Graphs of the bilateral ankle taken today demonstrate osseously mature individual right ankle does demonstrate dorsal exostosis at the talar neck.  Otherwise medial and lateral malleolar osteophytes are present..  She has a f similar finding to the left ankle joint as well.  Assessment & Plan:   Assessment: Lateral ankle instability right.  Lateral ankle instability left to lesser degree.  Hyperpronation right foot.  Plan: Discussed surgical intervention however it first were going to try orthotics to help prevent pronating of the subtalar joint and twisting of the ankle joint.     Mischa Brittingham T. [place], [place]

## 2023-07-23 ENCOUNTER — Ambulatory Visit: Admitting: Podiatry

## 2023-07-23 ENCOUNTER — Ambulatory Visit (INDEPENDENT_AMBULATORY_CARE_PROVIDER_SITE_OTHER)

## 2023-07-23 ENCOUNTER — Encounter: Payer: Self-pay | Admitting: Podiatry

## 2023-07-23 DIAGNOSIS — S90121A Contusion of right lesser toe(s) without damage to nail, initial encounter: Secondary | ICD-10-CM | POA: Diagnosis not present

## 2023-07-23 DIAGNOSIS — S92354A Nondisplaced fracture of fifth metatarsal bone, right foot, initial encounter for closed fracture: Secondary | ICD-10-CM

## 2023-07-23 NOTE — Progress Notes (Signed)
  She presents today chief complaint of a painful toe 2 nights ago as she dropped a piece of cabinetry on it.  She states it is swollen back past the joint and it is bruised.  Objective: Vital signs are stable alert and oriented x 3.  Pulses are palpable.  She has ecchymosis that extends past the level of the metatarsophalangeal joint fifth right it is exquisitely tender on palpation at the level of the joint.  Radiographs taken today demonstrate an osseously mature individual with what appears to be a transverse or impaction fracture of the diaphysis onto the base.  This is most visible on the lateral view.  Assessment: Fracture fifth digit right foot nondisplaced none comminuted closed.  Plan: I encouraged her to stay in soft shoes for the next few weeks and unless it dislocates she will follow-up with me on an as-needed basis.

## 2023-07-25 NOTE — Telephone Encounter (Signed)
 Called patient for follow up on her watchpat study as she was supposed to have gall bladder surgery last month or so.  She states she was just cleared by her doctor yesterday from that surgery and states she is now ready to begin the test.  She asked if her original pin number was still valid or if it needed to be re-activated.  I explained I wasn't sure on those questions and that I would forward this message to Kennon Rounds for advice and call back with clarification.

## 2023-07-29 ENCOUNTER — Ambulatory Visit: Payer: Self-pay | Attending: Physical Medicine & Rehabilitation | Admitting: Physical Therapy

## 2023-07-29 ENCOUNTER — Encounter: Payer: Self-pay | Admitting: Physical Therapy

## 2023-07-29 DIAGNOSIS — M5459 Other low back pain: Secondary | ICD-10-CM | POA: Insufficient documentation

## 2023-07-29 DIAGNOSIS — M62838 Other muscle spasm: Secondary | ICD-10-CM | POA: Insufficient documentation

## 2023-07-29 DIAGNOSIS — M792 Neuralgia and neuritis, unspecified: Secondary | ICD-10-CM | POA: Diagnosis not present

## 2023-07-29 NOTE — Therapy (Signed)
 OUTPATIENT PHYSICAL THERAPY TREATMENT / PROGRESS NOTE / RE-CERTIFICATION Dates of reporting from 04/01/2023 to 07/29/2023  Patient Name: Theresa Ortiz MRN: [id number] DOB:06-28-1990, 33 y.o., female Today's Date: 07/29/2023  END OF SESSION:    PT End of Session - 07/29/23 2011     Visit Number 38    Number of Visits 60    Date for PT Re-Evaluation 10/21/23    Authorization Type [place] [employer] P[employer] reporting period from 04/01/2023    Progress Note Due on Visit 40    PT Start Time 1606    PT Stop Time 1653    PT Time Calculation (min) 47 min    Activity Tolerance Patient tolerated treatment well    Behavior During Therapy [facility] for tasks assessed/performed                  Past Medical History:  Diagnosis Date   Acute meniscal tear of knee    left knee   Allergy    Anxiety    Asthma    Atrial fibrillation (HCC)    slight   COVID-19 04/2020   Depression    GERD (gastroesophageal reflux disease)    Ovarian cyst    PVC (premature ventricular contraction)    Scoliosis    Shingles    Tachycardia    Past Surgical History:  Procedure Laterality Date   CHOLECYSTECTOMY N/A 07/01/2023   Procedure: LAPAROSCOPIC CHOLECYSTECTOMY;  Surgeon: Emelia Loron, MD;  Location: [facility] OR;  Service: General;  Laterality: N/A;  GENERAL & TAP BLOCK   KNEE ARTHROSCOPY WITH LATERAL MENISECTOMY Left 09/01/2012   Procedure: LEFT KNEE ARTHROSCOPY WITH LATERAL MENISCECTOMY ;  Surgeon: Jacki Cones, MD;  Location: [facility];  Service: Orthopedics;  Laterality: Left;   MENISCUS REPAIR Right    RIGHT INDEX  FINGER TENDON REPAIR  NOV 2013   Patient Active Problem List   Diagnosis Date Noted   Elevated liver enzymes 07/21/2023   Administrative encounter 07/21/2023   Helicobacter pylori ab+ 07/21/2023   Leukocytosis 07/21/2023   Microcytosis 07/21/2023   BMI 40.0-44.9, adult (HCC) 07/21/2023   Overactive bladder 07/21/2023   Personal history of COVID-19 07/21/2023    Asthma 04/03/2023   Gastro-esophageal reflux disease without esophagitis 01/22/2019   Palpitation 07/13/2018   Atypical chest pain 07/13/2018   Family history of heart disease 07/13/2018   Shortness of breath 07/13/2018   Sinus tachycardia 07/13/2018   Moderate recurrent major depression (HCC) 05/25/2018   Tobacco user 07/29/2014   Mild persistent asthma, uncomplicated 07/04/2014   Impaired fasting glucose 11/18/2012   Acute lateral meniscus tear of left knee 09/01/2012   Attention deficit hyperactivity disorder, predominantly inattentive type 05/03/2011    PCP: Tisovec, Adelfa Koh, MD  REFERRING PROVIDER: Filomena Jungling, MD  REFERRING DIAG: 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 Pt reports she has some pain at the mid calf that is the size of packing tape that feels like a cramp. She reports she has tension at L low back and glute. LBP/glute pain aggravated by lifting/moving patients, and bending forward. Has had L sided sciatic symptoms 1x/week when lifting something heavy  down post LLE. She hs previously had pain with prolonged walking, but that has subsided over the past year or so. Has had successful PT in the past with [facility], which she thinks is very helpful. Pt works full time as an Engineer, structural, enjoys traveling and walking her dog. Comorbid conditions as listed above. No falls in past 6 months. Pt denies N/V, B&B changes, unexplained weight fluctuation, saddle paresthesia, fever, night sweats, or unrelenting night pain at this time. Patient denies hx of cancer, stroke, seizures, diabetes, unexplained weight loss, unexplained changes in  bowel or bladder problems, unexplained stumbling or dropping things, osteoporosis, and spinal surgery.  SUBJECTIVE:                                                                                                                                                                                           SUBJECTIVE STATEMENT:   Patient states her cholecystectomy on 07/01/2023 "kicked my butt" and she "was down for 3 weeks." She states her back and leg pain got worse since she could not use her core when moving around. She is planning to go back to work on 08/04/2023. She has been cleared for PT by her surgeon as of 07/23/2023. She had some trouble with the healing of her incision at the umbilicus but it is better now. She states she had really bad nerve pain about 2 inches lower/L lateral to umbilicus, but it is much better now. She is nervous about going back to work after not having done much and is concerned about return to work potentially "pissing off my back." She "pulled" her back one day from over-compensating while recovering from her surgery. She also broke her right pinkie toe and she has been seen for it and been told it is fine. She reports she has been released from all restrictions.    PAIN: Are you having pain? Yes NPRS: Current: 0.5/10,  Best: 0/10, Worst: 5/10. Pain location: mainly at left leg from the calf down today.   Precautions: none  PATIENT GOALS: Get the cramp out of my calf, to "eleviate whatever pissed it off this time out of the leg"   OBJECTIVE  SELF-REPORTED FUNCTION Patient Specific Functional Scale (PSFS)  Repetitively bending over (to do things like picking up sticks from the yard and clean her home): 4 Strenuous activity like lifting things: 2 Prolonged standing: 6 Average: 4/10   SPINE MOTION  LUMBAR SPINE AROM *Indicates pain Flexion: fingers on floor (normal ROM) Extension: 75% pain in right low back Side Flexion:   R fingers at patella  L fingers 1  inch from patella Rotation:  R B WFL L B WFL   NEUROLOGICAL  Upper Motor Neuron Screen Hoffman's and Clonus (ankle) negative bilaterally.  Deep Tendon Reflexes R/L  2+/2+ Biceps brachii reflex (C5, C6) 2+/2+ Quadriceps reflex (L4) 1+/3+ Achilles reflex (S1)  LOWER LIMB NEURODYNAMIC TESTS Slump Test  Lumbar spine flexed:  R = positive  L = negative Lumbar spine extended:  R = negative L = negative  FUNCTIONAL/BALANCE TESTS: Plank: 29 seconds Bering-Sorenson test: 54 seconds Floor to waist box lift: 40# max   TODAY'S TREATMENT   Therapeutic exercise: therapeutic exercises that incorporate ONE parameter at one or more areas of the body to centralize symptoms, develop strength and endurance, range of motion, and flexibility required for successful completion of functional activities.  NuStep level 4 using bilateral upper and lower extremities. Seat/handle setting 9/9. For improved extremity mobility, muscular endurance, and activity tolerance; and to induce the analgesic effect of aerobic exercise, stimulate improved joint nutrition, and prepare body structures and systems for following interventions. x 6  minutes. Average SPM = did not record.  Testing to assess progress (see above)  Review of HEP with update  Therapeutic activities: dynamic therapeutic activities incorporating MULTIPLE parameters or areas of the body designed to achieve improved functional performance.  Floor to waist lift with box (too assess ability - see above) 1x1 rep of each weight starting with 10# box and adding 5# each rep until pt unable to lift correctly, or does not feel they can proceed without symptoms. Up to 40# (max).   Pt required multimodal cuing for proper technique and to facilitate improved neuromuscular control, strength, range of motion, and functional ability resulting in improved performance and form.   PATIENT EDUCATION:  Education details: Exercise purpose/form. Self management  techniques. POC Person educated: Patient Education method: Explanation and Demonstration Education comprehension: verbalized understanding and returned demonstration, needs further education.   HOME EXERCISE PROGRAM: Access Code: [email or web address] URL: [email or web address] Date: 07/29/2023 Prepared by: Norton Blizzard  Exercises - Bird Dog  - 3 x weekly - 2 sets - 10 reps - 5 second hold - Dead Bug  - 3 x weekly - 3 sets - 5-10 reps - Standard Plank  - 3 x weekly - 5 sets - 25 seconds hold - Standing Anti-Rotation Press with Anchored Resistance  - 3 x weekly - 3 sets - 10 reps - 5 seconds hold  Discontinued for now:   ASSESSMENT:  CLINICAL IMPRESSION:   Patient has attended 12 PT sessions since starting current episode of care on 07/01/2022. She is returning to PT 4 weeks after cholecystectomy on 06/30/2023. Although her pain is lower today, she has experienced a decline in functional activity tolerance and has experienced worsening low back and leg symptoms while away from PT. New baseline measurements to updated today and new goals set to reflect change in patient reported outcome measures available at the clinic and patient's goals and needs for recovery. Patient had pain in the right side of her back with extension, positive slump test for R sciatic nerve in lumbar flexion, and has decreased R achilles reflex, which all point towards right sided nerve root irritation. However, her symptom are more bothersome on the left side in her leg and buttocks. She continues to lack core and functional strength matched to her age group and would benefit from PT for improved strength, motor control, pain, nerve irritation, and function to return to work successfully and preserve quality of life and function without repeated flair ups of her condition.  Plan to continue PT at a frequency of 1-2x per week for 12 more weeks, with progress checks every 10 visit or as needed. Patient would benefit from  continued management of limiting condition by skilled physical therapist to address remaining impairments and functional limitations to work towards stated goals and return to PLOF or maximal functional independence.   From Re-Evaluation 11/19/2022:  Patient is a 33 y.o. 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 Patient is returning to PT more than 90 days since last PT visit to continue care for the same condition as before. She completed 5 physical therapy sessions with improvement in symptoms before her attendance lapsed. She now presents with worsening left LE symptoms and low back and left glute pain since her work environment changed. She had good response to physical therapy intervention in the past and good potential for improvement again with consistent attendance. Patient did demonstrate improvement in trunk endurance at today's goal assessment. Patient presents with significant pain, paresthesia, muscle tension, motor control, muscle performance (strength/power/endurance), and activity tolerance impairments that are limiting ability to complete her usual activities such as working, walking, prolonged standing, bending, lifting, yardwork, housework, sleeping, traveling, prolonged sitting, going to the beach, anything that requires repetitive bending or lifting without difficulty. Patient will benefit from skilled physical therapy intervention to address current body structure impairments and activity limitations to improve function and work towards goals set in current POC in order to return to prior level of function or maximal functional improvement.   OBJECTIVE IMPAIRMENTS: Abnormal gait, decreased activity tolerance, decreased balance, decreased endurance,  decreased knowledge of condition, decreased mobility, difficulty walking, decreased ROM, decreased strength, increased fascial restrictions, impaired perceived functional ability, increased muscle spasms, impaired flexibility, improper body mechanics, postural dysfunction, obesity, and pain.   ACTIVITY LIMITATIONS: carrying, lifting, bending, sitting, standing, squatting, stairs, transfers, hygiene/grooming, locomotion level, and caring for others  PARTICIPATION LIMITATIONS: meal prep, cleaning, laundry, driving, shopping, community activity, occupation, yard work, and   working, walking, prolonged standing, bending, lifting, yardwork, housework, sleeping, traveling, prolonged sitting, going to [employer], anything that requires repetitive bending or lifting   PERSONAL FACTORS: Fitness, Past/current experiences, Time since onset of injury/illness/exacerbation, and 1-2 comorbidities: lateral meniscus tear of left knee; Palpitation; Atypical chest pain; Family history of heart disease; Shortness of breath; and Sinus tachycardia on their problem list. past medical history of Acute meniscal tear of knee, Allergy, Anxiety, Asthma, Atrial fibrillation (HCC), COVID-19 (04/2020), Depression, Ovarian cyst, PVC (premature ventricular contraction), Scoliosis, Shingles, and Tachycardia. past surgical history that includes RIGHT INDEX  FINGER TENDON REPAIR (NOV 2013); Knee arthroscopy with lateral menisectomy (Left, 09/01/2012); and Meniscus repair (Right)  are also affecting patient's functional outcome.   REHAB POTENTIAL: Good  CLINICAL DECISION MAKING: Evolving/moderate complexity  EVALUATION COMPLEXITY: Moderate   GOALS: Goals reviewed with patient? Yes  SHORT TERM GOALS: Target date: 08/07/22. Target date updated to 12/04/2022 on 11/19/2022.   Pt will be independent with initial HEP in order to improve strength and balance in order to decrease fall risk and improve function at home and work. Baseline:HEP  given; patient not participating (11/19/2022); participating regularly (12/02/2022);  Goal status: MET   LONG TERM GOALS: Target date: 08/26/22. Target date updated to 02/11/2023 for all unmet goals on 11/19/2022. Target date updated to 04/14/2023 for  all unmet goals on 01/20/2023. Target date updated to 10/21/2023 or all unmet goals on 07/29/2023  Patient will demonstrate improved FOTO by equal or greater than 10 by visit #17 to demonstrate improvement in overall condition and self-reported functional ability.  Baseline: initial baseline 55 (07/01/2022); 60 at visit #17 (12/26/2022); 66 at visit #30 Goal status: MET (Discontinued 07/29/2023 due to clinic no longer having access to this measure).   2.  Pt will decrease worst pain as reported on NPRS by at least 3 points in order to demonstrate clinically significant reduction in pain. Baseline: 4/10 (07/01/2022): up to 10/10  (11/19/2022); up to 4-5/10 when burning two days ago, 2.5/10 when excluding the burning (12/06/2022); 3-4/10 in the last 2 weeks (01/20/2023); Pain is 1.5/10 over the past 2 weeks  Goal status: on-going  3.  Pt will demonstrate plank time of or more to demonstrate age-predicted match of core strength needed for job duties Baseline: 10sec (07/01/2022): 24 seconds (11/19/2022); 23 seconds (12/26/2022); 37 seconds (01/20/2023); 35 seconds (04/01/2023); 29 seconds (07/29/2023);  Goal status: In-progress  4.  Pt will demonstrate bering sorenson test of 34sec or more to demonstrate clinically significant increase in spinal extensor strength needed for job duties Baseline: 20sec (07/01/2022); 58 seconds (11/19/2022); deferred(04/01/23); 54 seconds (07/29/2023);  Goal status: MET  5.  Patient will be independent with long term HEP program for self-management of symptoms. Baseline: HEP to be reviewed and updated as needed at visit 7 as appropriate (11/19/22); she has has been participating 2x a week plus PT sessions + what  is left over after PT session  (12/26/2022); has not been doing since cholecystectomy on 06/30/2023 (07/29/2023);  Goal status: MET, no regressed (07/29/2023).    6.  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  Baseline: 40# (07/29/23); Goal status: NEW 07/29/2023  7.  Patient will demonstrate improvement in Patient Specific Functional Scale (PSFS) of equal or greater than 8/10 points to reflect clinically significant improvement in patient's most valued functional activities.. Baseline: 4/10 (07/29/23); Goal status: NEW 07/29/2023  PLAN:  PT FREQUENCY: 1-2x/week  PT DURATION: 6 weeks  PLANNED INTERVENTIONS: Therapeutic exercises, Therapeutic activity, Neuromuscular re-education, Balance training, Gait training, Patient/Family education, Self Care, Joint mobilization, Stair training, Dry Needling, Electrical stimulation, Spinal mobilization, Cryotherapy, Moist heat, Manual therapy, and Re-evaluation.  PLAN FOR NEXT SESSION:  Update HEP as appropriate, progressive core/LE/functional strengthening/motor control  exercises.    Luretha Murphy. [name], PT, DPT 07/29/23, 8:37 PM  [facility] [address] [place], [place] [address] P: [phone] I F: [phone]

## 2023-07-31 ENCOUNTER — Encounter: Payer: Self-pay | Admitting: Physical Therapy

## 2023-08-05 ENCOUNTER — Encounter: Payer: Self-pay | Admitting: Physical Therapy

## 2023-08-05 ENCOUNTER — Ambulatory Visit: Payer: Self-pay | Admitting: Physical Therapy

## 2023-08-05 DIAGNOSIS — M62838 Other muscle spasm: Secondary | ICD-10-CM

## 2023-08-05 DIAGNOSIS — M5459 Other low back pain: Secondary | ICD-10-CM

## 2023-08-05 DIAGNOSIS — M792 Neuralgia and neuritis, unspecified: Secondary | ICD-10-CM

## 2023-08-05 NOTE — Therapy (Signed)
 OUTPATIENT PHYSICAL THERAPY TREATMENT  Patient Name: Theresa Ortiz MRN: [id number] DOB:1990/06/15, 33 y.o., female Today's Date: 08/05/2023  END OF SESSION:    PT End of Session - 08/05/23 1743     Visit Number 39    Number of Visits 60    Date for PT Re-Evaluation 10/21/23    Authorization Type [place] [employer] P[employer] reporting period from 04/01/2023    Progress Note Due on Visit 40    PT Start Time 1732    PT Stop Time 1812    PT Time Calculation (min) 40 min    Activity Tolerance Patient tolerated treatment well    Behavior During Therapy [facility] for tasks assessed/performed                   Past Medical History:  Diagnosis Date   Acute meniscal tear of knee    left knee   Allergy    Anxiety    Asthma    Atrial fibrillation (HCC)    slight   COVID-19 04/2020   Depression    GERD (gastroesophageal reflux disease)    Ovarian cyst    PVC (premature ventricular contraction)    Scoliosis    Shingles    Tachycardia    Past Surgical History:  Procedure Laterality Date   CHOLECYSTECTOMY N/A 07/01/2023   Procedure: LAPAROSCOPIC CHOLECYSTECTOMY;  Surgeon: Emelia Loron, MD;  Location: [facility] OR;  Service: General;  Laterality: N/A;  GENERAL & TAP BLOCK   KNEE ARTHROSCOPY WITH LATERAL MENISECTOMY Left 09/01/2012   Procedure: LEFT KNEE ARTHROSCOPY WITH LATERAL MENISCECTOMY ;  Surgeon: Jacki Cones, MD;  Location:  SURGERY CENTER;  Service: Orthopedics;  Laterality: Left;   MENISCUS REPAIR Right    RIGHT INDEX  FINGER TENDON REPAIR  NOV 2013   Patient Active Problem List   Diagnosis Date Noted   Elevated liver enzymes 07/21/2023   Administrative encounter 07/21/2023   Helicobacter pylori ab+ 07/21/2023   Leukocytosis 07/21/2023   Microcytosis 07/21/2023   BMI 40.0-44.9, adult (HCC) 07/21/2023   Overactive bladder 07/21/2023   Personal history of COVID-19 07/21/2023   Asthma 04/03/2023   Gastro-esophageal reflux disease without esophagitis  01/22/2019   Palpitation 07/13/2018   Atypical chest pain 07/13/2018   Family history of heart disease 07/13/2018   Shortness of breath 07/13/2018   Sinus tachycardia 07/13/2018   Moderate recurrent major depression (HCC) 05/25/2018   Tobacco user 07/29/2014   Mild persistent asthma, uncomplicated 07/04/2014   Impaired fasting glucose 11/18/2012   Acute lateral meniscus tear of left knee 09/01/2012   Attention deficit hyperactivity disorder, predominantly inattentive type 05/03/2011    PCP: Tisovec, Adelfa Koh, MD  REFERRING PROVIDER: Filomena Jungling, MD  REFERRING DIAG: 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 Pt reports she has some pain at the mid calf that is the size of packing tape that feels like a cramp. She reports she has tension at L low back and glute. LBP/glute pain aggravated by lifting/moving patients, and bending forward. Has had L sided sciatic symptoms 1x/week when lifting something heavy down post LLE. She hs previously had pain with prolonged walking,  but that has subsided over the past year or so. Has had successful PT in the past with [facility], which she thinks is very helpful. Pt works full time as an Engineer, structural, enjoys traveling and walking her dog. Comorbid conditions as listed above. No falls in past 6 months. Pt denies N/V, B&B changes, unexplained weight fluctuation, saddle paresthesia, fever, night sweats, or unrelenting night pain at this time. Patient denies hx of cancer, stroke, seizures, diabetes, unexplained weight loss, unexplained changes in bowel or bladder problems, unexplained stumbling or dropping things,  osteoporosis, and spinal surgery.  SUBJECTIVE:                                                                                                                                                                                           SUBJECTIVE STATEMENT:   Patient states she went back to work yesterday and it "kicked my butt." She had restless feeling in her legs all night. Today was better since she was in the OR all day. She has some soreness in her left glute when she was stretching and she is feeling nerve discomfort maybe tingling over the the left lower leg at anterior and lateral aspect. She felt fine after last PT session. She tried to do her HEP but after she did a couple of them she had pulling in the region where she had the most pain after her cholecystectomy about 2 inches caudal and lateral to her umbilicus. She felt this more after the 2nd or 3rd day doing the exercises, so she stopped. She felt it sitting up on Thursday when leaning forward in the chair, then it hurt on Thursday night when she felt it more. She is being fit for some orthotics on 09/05/23.    PAIN: Are you having pain? Yes NPRS: Current: 3/10 Pain location: mainly at left lower leg front and side   Precautions: none  PATIENT GOALS: Get the cramp out of my calf, to "eleviate whatever pissed it off this time out of the leg"   OBJECTIVE  TODAY'S TREATMENT   Therapeutic exercise: therapeutic exercises that incorporate ONE parameter at one or more areas of the body to centralize symptoms, develop strength and endurance, range of motion, and flexibility required for successful completion of functional activities.  NuStep level 7-10 using bilateral upper and lower extremities. Seat/handle setting 9/9. For improved extremity mobility, muscular endurance, and activity tolerance; and to induce the analgesic effect of aerobic exercise, stimulate improved joint nutrition, and prepare body structures and systems for following  interventions. Intention to find moderate to vigorous intensity to help patient learn what is necessary to meet  physical activity guidelines for health.  9  minutes.  Average SPM = 84 Increased up to level 9 attempting to keep SPM above 90. RPE 6/10. Having a little trouble holding a conversation, but able to speak multiple sentences.    Neuromuscular Re-education: a technique or exercise performed with the goal of improving the level of communication between the body and the brain, such as for balance, motor control, muscle activation patterns, coordination, desensitization, quality of muscle contraction, proprioception, and/or kinesthetic sense needed for successful and safe completion of functional activities.   Diaphragmatic breathing in hooklying and seated postures, focusing on coordination with abdominal drawing in and pelvic floor contraction without excessive chest or spine movement to prep for progression to improving core brace for improved spinal stability while bending, lifting, and working. Patient initially only able to prevent a lot of chest movement in hooklying, but improved to being able to perform seated. Attempted progression to 360 breathing instead of belly breathing, but was not able to coordinate yet. Also demonstrated and practiced a few reps of AROM squat with coordinated breathing and brace, but unable to transfer motor control to that yet.   Education on HEP including handout    Pt required multimodal cuing for proper technique and to facilitate improved neuromuscular control, strength, range of motion, and functional ability resulting in improved performance and form.   PATIENT EDUCATION:  Education details: Exercise purpose/form. Self management techniques. Education on diagnosis, prognosis, POC, anatomy and physiology of current condition. Education on HEP including handout. Person educated: Patient Education method: Medical illustrator Education  comprehension: verbalized understanding and returned demonstration, needs further education.   HOME EXERCISE PROGRAM: Access Code: [email or web address] URL: [email or web address] Date: 08/05/2023 Prepared by: Norton Blizzard  Exercises - Bird Dog  - 3 x weekly - 2 sets - 10 reps - 5 second hold - Dead Bug  - 3 x weekly - 3 sets - 5-10 reps - Standard Plank  - 3 x weekly - 5 sets - 25 seconds hold - Standing Anti-Rotation Press with Anchored Resistance  - 3 x weekly - 3 sets - 10 reps - 5 seconds hold - Seated Diaphragmatic Breathing  - 3 x daily - 20 reps   Discontinued for now:   ASSESSMENT:  CLINICAL IMPRESSION:   Today's session included improving awareness of intensity for aerobic exercise needed for meeting physical activity guidelines for health, which will promote healing and health of the overall body and with pain reduction as well as prepare for long term HEP.  Also focused on coordination of breathing with abdominal muscles and pelvic floor in preparation and part of progression to reach ability to perform correct and effective brace when performing exercises that require increased active support of the spine, such as bending, lifting, and working. Patient was able to progress as far as abdominal diaphragmatic breathing in seated/upright position. Plan to progress to 360 breathing and bracing techniques next session. Patient had good tolerance for session with no increased pain. Patient would benefit from continued management of limiting condition by skilled physical therapist to address remaining impairments and functional limitations to work towards stated goals and return to PLOF or maximal functional independence.   From Re-Evaluation 11/19/2022:  Patient is a 33 y.o. 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 Patient is  returning to PT more than 90 days since last PT visit to continue care for the same condition as before. She completed 5 physical therapy sessions with improvement in symptoms before her attendance lapsed. She now presents with worsening left LE symptoms and low back and left glute pain since her work environment changed. She had good response to physical therapy intervention in the past and good potential for improvement again with consistent attendance. Patient did demonstrate improvement in trunk endurance at today's goal assessment. Patient presents with significant pain, paresthesia, muscle tension, motor control, muscle performance (strength/power/endurance), and activity tolerance impairments that are limiting ability to complete her usual activities such as working, walking, prolonged standing, bending, lifting, yardwork, housework, sleeping, traveling, prolonged sitting, going to the beach, anything that requires repetitive bending or lifting without difficulty. Patient will benefit from skilled physical therapy intervention to address current body structure impairments and activity limitations to improve function and work towards goals set in current POC in order to return to prior level of function or maximal functional improvement.   OBJECTIVE IMPAIRMENTS: Abnormal gait, decreased activity tolerance, decreased balance, decreased endurance, decreased knowledge of condition, decreased mobility, difficulty walking, decreased ROM, decreased strength, increased fascial restrictions, impaired perceived functional ability, increased muscle spasms, impaired flexibility, improper body mechanics, postural dysfunction, obesity, and pain.   ACTIVITY LIMITATIONS: carrying, lifting, bending, sitting, standing, squatting, stairs, transfers, hygiene/grooming, locomotion level, and caring for others  PARTICIPATION LIMITATIONS: meal prep,  cleaning, laundry, driving, shopping, community activity, occupation, yard work, and   working, walking, prolonged standing, bending, lifting, yardwork, housework, sleeping, traveling, prolonged sitting, going to [employer], anything that requires repetitive bending or lifting   PERSONAL FACTORS: Fitness, Past/current experiences, Time since onset of injury/illness/exacerbation, and 1-2 comorbidities: lateral meniscus tear of left knee; Palpitation; Atypical chest pain; Family history of heart disease; Shortness of breath; and Sinus tachycardia on their problem list. past medical history of Acute meniscal tear of knee, Allergy, Anxiety, Asthma, Atrial fibrillation (HCC), COVID-19 (04/2020), Depression, Ovarian cyst, PVC (premature ventricular contraction), Scoliosis, Shingles, and Tachycardia. past surgical history that includes RIGHT INDEX  FINGER TENDON REPAIR (NOV 2013); Knee arthroscopy with lateral menisectomy (Left, 09/01/2012); and Meniscus repair (Right)  are also affecting patient's functional outcome.   REHAB POTENTIAL: Good  CLINICAL DECISION MAKING: Evolving/moderate complexity  EVALUATION COMPLEXITY: Moderate   GOALS: Goals reviewed with patient? Yes  SHORT TERM GOALS: Target date: 08/07/22. Target date updated to 12/04/2022 on 11/19/2022.   Pt will be independent with initial HEP in order to improve strength and balance in order to decrease fall risk and improve function at home and work. Baseline:HEP given; patient not participating (11/19/2022); participating regularly (12/02/2022);  Goal status: MET   LONG TERM GOALS: Target date: 08/26/22. Target date updated to 02/11/2023 for all unmet goals on 11/19/2022. Target date updated to 04/14/2023 for all unmet goals on 01/20/2023. Target date updated to 10/21/2023 or all unmet goals on 07/29/2023  Patient will demonstrate improved FOTO by equal or greater than 10 by visit #17 to demonstrate improvement in overall condition and self-reported  functional ability.  Baseline: initial baseline 55 (07/01/2022); 60 at visit #17 (12/26/2022); 66 at visit #30 Goal status: MET (Discontinued 07/29/2023 due to clinic no longer having access to this measure).   2.  Pt will decrease worst pain as reported on NPRS by at least 3 points in order to demonstrate clinically significant reduction in pain. Baseline: 4/10 (07/01/2022): up to 10/10  (11/19/2022); up to 4-5/10 when burning two  days ago, 2.5/10 when excluding the burning (12/06/2022); 3-4/10 in the last 2 weeks (01/20/2023); Pain is 1.5/10 over the past 2 weeks  Goal status: on-going  3.  Pt will demonstrate plank time of or more to demonstrate age-predicted match of core strength needed for job duties Baseline: 10sec (07/01/2022): 24 seconds (11/19/2022); 23 seconds (12/26/2022); 37 seconds (01/20/2023); 35 seconds (04/01/2023); 29 seconds (07/29/2023);  Goal status: In-progress  4.  Pt will demonstrate bering sorenson test of 34sec or more to demonstrate clinically significant increase in spinal extensor strength needed for job duties Baseline: 20sec (07/01/2022); 58 seconds (11/19/2022); deferred(04/01/23); 54 seconds (07/29/2023);  Goal status: MET  5.  Patient will be independent with long term HEP program for self-management of symptoms. Baseline: HEP to be reviewed and updated as needed at visit 7 as appropriate (11/19/22); she has has been participating 2x a week plus PT sessions + what  is left over after PT session (12/26/2022); has not been doing since cholecystectomy on 06/30/2023 (07/29/2023);  Goal status: MET, no regressed (07/29/2023).    6.  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  Baseline: 40# (07/29/23); Goal status: NEW 07/29/2023  7.  Patient will demonstrate improvement in Patient Specific Functional Scale (PSFS) of equal or greater than 8/10 points to reflect clinically significant  improvement in patient's most valued functional activities.. Baseline: 4/10 (07/29/23); Goal status: NEW 07/29/2023  PLAN:  PT FREQUENCY: 1-2x/week  PT DURATION: 6 weeks  PLANNED INTERVENTIONS: Therapeutic exercises, Therapeutic activity, Neuromuscular re-education, Balance training, Gait training, Patient/Family education, Self Care, Joint mobilization, Stair training, Dry Needling, Electrical stimulation, Spinal mobilization, Cryotherapy, Moist heat, Manual therapy, and Re-evaluation.  PLAN FOR NEXT SESSION:  Update HEP as appropriate, progressive core/LE/functional strengthening/motor control  exercises.    Luretha Murphy. [name], PT, DPT 08/05/23, 7:15 PM  [facility] [address] [place], [place] [address] P: [phone] I F: [phone]

## 2023-08-06 NOTE — Telephone Encounter (Signed)
 Notified patient of PIN for Itamar device via VM, per DPR.

## 2023-08-07 ENCOUNTER — Encounter: Payer: Self-pay | Admitting: Physical Therapy

## 2023-08-12 ENCOUNTER — Ambulatory Visit: Payer: Self-pay | Admitting: Physical Therapy

## 2023-08-12 DIAGNOSIS — M792 Neuralgia and neuritis, unspecified: Secondary | ICD-10-CM

## 2023-08-12 DIAGNOSIS — M62838 Other muscle spasm: Secondary | ICD-10-CM

## 2023-08-12 DIAGNOSIS — M5459 Other low back pain: Secondary | ICD-10-CM | POA: Diagnosis not present

## 2023-08-12 NOTE — Therapy (Unsigned)
 OUTPATIENT PHYSICAL THERAPY TREATMENT / PROGRESS NOTE Dates of reporting from 04/01/2023 to 08/13/2023 Patient Name: Theresa Ortiz MRN: [id number] DOB:Sep 02, 1990, 33 y.o., female Today's Date: 08/13/2023  END OF SESSION:    PT End of Session - 08/12/23 1838     Visit Number 40    Number of Visits 60    Date for PT Re-Evaluation 10/21/23    Authorization Type [place] [employer] P[employer] reporting period from 04/01/2023    Progress Note Due on Visit 40    PT Start Time 1735    PT Stop Time 1815    PT Time Calculation (min) 40 min    Activity Tolerance Patient tolerated treatment well    Behavior During Therapy [facility] for tasks assessed/performed                    Past Medical History:  Diagnosis Date   Acute meniscal tear of knee    left knee   Allergy    Anxiety    Asthma    Atrial fibrillation (HCC)    slight   COVID-19 04/2020   Depression    GERD (gastroesophageal reflux disease)    Ovarian cyst    PVC (premature ventricular contraction)    Scoliosis    Shingles    Tachycardia    Past Surgical History:  Procedure Laterality Date   CHOLECYSTECTOMY N/A 07/01/2023   Procedure: LAPAROSCOPIC CHOLECYSTECTOMY;  Surgeon: Enid Harry, MD;  Location: [facility] OR;  Service: General;  Laterality: N/A;  GENERAL & TAP BLOCK   KNEE ARTHROSCOPY WITH LATERAL MENISECTOMY Left 09/01/2012   Procedure: LEFT KNEE ARTHROSCOPY WITH LATERAL MENISCECTOMY ;  Surgeon: Florencia Hunter, MD;  Location: [facility];  Service: Orthopedics;  Laterality: Left;   MENISCUS REPAIR Right    RIGHT INDEX  FINGER TENDON REPAIR  NOV 2013   Patient Active Problem List   Diagnosis Date Noted   Elevated liver enzymes 07/21/2023   Administrative encounter 07/21/2023   Helicobacter pylori ab+ 07/21/2023   Leukocytosis 07/21/2023   Microcytosis 07/21/2023   BMI 40.0-44.9, adult (HCC) 07/21/2023   Overactive bladder 07/21/2023   Personal history of COVID-19 07/21/2023   Asthma 04/03/2023    Gastro-esophageal reflux disease without esophagitis 01/22/2019   Palpitation 07/13/2018   Atypical chest pain 07/13/2018   Family history of heart disease 07/13/2018   Shortness of breath 07/13/2018   Sinus tachycardia 07/13/2018   Moderate recurrent major depression (HCC) 05/25/2018   Tobacco user 07/29/2014   Mild persistent asthma, uncomplicated 07/04/2014   Impaired fasting glucose 11/18/2012   Acute lateral meniscus tear of left knee 09/01/2012   Attention deficit hyperactivity disorder, predominantly inattentive type 05/03/2011    PCP: Tisovec, Kristina Pfeiffer, MD  REFERRING PROVIDER: Candi Chafe, MD  REFERRING DIAG: 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 Pt reports she has some pain at the mid calf that is the size of packing tape that feels like a cramp. She reports she has tension at L low back and glute. LBP/glute pain aggravated by lifting/moving patients, and bending forward. Has had L sided sciatic symptoms 1x/week when lifting something heavy down  post LLE. She hs previously had pain with prolonged walking, but that has subsided over the past year or so. Has had successful PT in the past with [facility], which she thinks is very helpful. Pt works full time as an Engineer, structural, enjoys traveling and walking her dog. Comorbid conditions as listed above. No falls in past 6 months. Pt denies N/V, B&B changes, unexplained weight fluctuation, saddle paresthesia, fever, night sweats, or unrelenting night pain at this time. Patient denies hx of cancer, stroke, seizures, diabetes, unexplained weight loss, unexplained changes in bowel or bladder  problems, unexplained stumbling or dropping things, osteoporosis, and spinal surgery.  SUBJECTIVE:                                                                                                                                                                                           SUBJECTIVE STATEMENT:   Patient states she is doing okay but she has a pain from her left greater trochanter to her foot. She has been wearing new balance shoes to work the last week and her foot pain has been better. She felt fine after last PT session. She was not sore or anything the next day. That whole week she could not really sleep because her legs were so restless. She did not have this problem as bad last week. She feels this in both legs. The spot at her left glute has been really tender and it has been worse as she takes each step. She also notices when she sits there. She states she has done her HEP 3 times since last PT session. She states the breathing one is still elusive. She can do about 3 good ones, then she has to reset. She still has coordination difficulty with the movement.   PAIN: Are you having pain? Yes NPRS: Current: 3/10 Pain location: left greater trochanter to ankle, left glute  Precautions: none  PATIENT GOALS: Get the cramp out of my calf, to "eleviate whatever pissed it off this time out of the leg"   OBJECTIVE (last measured 07/29/2023)  SELF-REPORTED FUNCTION Patient Specific Functional Scale (PSFS)  Repetitively bending over (to do things like picking up sticks from the yard and clean her home): 4 Strenuous activity like lifting things: 2 Prolonged standing: 6 Average: 4/10     SPINE MOTION   LUMBAR SPINE AROM *Indicates pain Flexion: fingers on floor (normal ROM) Extension: 75% pain in right low back Side Flexion:        R fingers at patella       L fingers 1 inch from patella Rotation:  R B WFL L B [facility]  NEUROLOGICAL   Upper Motor Neuron Screen Hoffman's and  Clonus (ankle) negative bilaterally.  Deep Tendon Reflexes R/L  2+/2+ Biceps brachii reflex (C5, C6) 2+/2+ Quadriceps reflex (L4) 1+/3+ Achilles reflex (S1)   LOWER LIMB NEURODYNAMIC TESTS Slump Test  Lumbar spine flexed:  R = positive  L = negative Lumbar spine extended:  R = negative L = negative   FUNCTIONAL/BALANCE TESTS: Plank: 29 seconds Bering-Sorenson test: 54 seconds Floor to waist box lift: 40# max   TODAY'S TREATMENT   Therapeutic exercise: therapeutic exercises that incorporate ONE parameter at one or more areas of the body to centralize symptoms, develop strength and endurance, range of motion, and flexibility required for successful completion of functional activities.  NuStep using bilateral upper and lower extremities. For improved extremity mobility, muscular endurance, and activity tolerance; and to induce the analgesic effect of aerobic exercise, stimulate improved joint nutrition, and prepare body structures and systems for following interventions. Also to reinforce understanding of appropriate exercise intensity to help meet physical activity guidelines for health.  Seat/handle setting: 9/9 5  minutes Level: 10 Target SPM: > 90 Average SPM: 81 RPE: 7/10  Neuromuscular Re-education: a technique or exercise performed with the goal of improving the level of communication between the body and the brain, such as for balance, motor control, muscle activation patterns, coordination, desensitization, quality of muscle contraction, proprioception, and/or kinesthetic sense needed for successful and safe completion of functional activities.   Diaphragmatic breathing in seated and standing postures, progressing to 360 breathing in standing, then learning and practicing abdominal brace with squat. Practiced repeated parts and whole movement with spaced repetition and multimodal cuing, feedback, and education with result in able to perform 1-2 fair reps at a time by end of  session. Updated HEP and included video of instructions on how to learn task.  Pt required multimodal cuing for proper technique and to facilitate improved neuromuscular control, strength, range of motion, and functional ability resulting in improved performance and form.   PATIENT EDUCATION:  Education details: Exercise purpose/form. Self management techniques. Education on diagnosis, prognosis, POC, anatomy and physiology of current condition. Education on HEP including handout. Person educated: Patient Education method: Medical illustrator Education comprehension: verbalized understanding and returned demonstration, needs further education.   HOME EXERCISE PROGRAM: Access Code: [email or web address] URL: [email or web address] Date: 08/05/2023 Prepared by: Alleen Isle  Exercises - Bird Dog  - 3 x weekly - 2 sets - 10 reps - 5 second hold - Dead Bug  - 3 x weekly - 3 sets - 5-10 reps - Standard Plank  - 3 x weekly - 5 sets - 25 seconds hold - Standing Anti-Rotation Press with Anchored Resistance  - 3 x weekly - 3 sets - 10 reps - 5 seconds hold - Seated Diaphragmatic Breathing  - 3 x daily - 20 reps   Discontinued for now:   ASSESSMENT:  CLINICAL IMPRESSION:   Today's session focused on progressing to improved abdominal brace during squat with coordination of breath and pelvic floor to improve her ability to squat, lift, and move patients without aggravating her back. Patient tolerated session well with mild reduction in pain. She continues in the cognitive phase of learning the task, but is making progress.   Patient has attended 40 physical therapy sessions since starting current episode of  care on 07/01/2022. She recently returned to PT 2 weeks ago after cholecystectomy on 06/30/2023. While out for surgery she experienced a decline in functional activity tolerance and experienced worsening on  low back an leg symptoms. New baseline measurements updated 2 weeks ago and new  goals were added at that time to reflect change in patient reported outcome measures  available at the clinic and patient's goals and needs for recovery. Patient has returnred to work successfully in the last 2 weeks and she has returned to her 3x a week HEP. 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  Patient would benefit from continued management of limiting condition by skilled physical therapist to address remaining impairments and functional limitations to work towards stated goals and return to PLOF or maximal functional independence.    From Re-Evaluation 11/19/2022:  Patient is a 33 y.o. 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 Patient is returning to PT more than 90 days since last PT visit to continue care for the same condition as before. She completed 5 physical therapy sessions with improvement in symptoms before her attendance lapsed. She now presents with worsening left LE symptoms and low back and left glute pain since her work environment changed. She had good response to physical therapy intervention in the past and good potential for improvement again with consistent attendance. Patient did demonstrate improvement in trunk endurance at today's goal assessment. Patient presents with significant pain, paresthesia, muscle tension, motor control, muscle performance (strength/power/endurance), and activity tolerance impairments that are limiting ability to complete her usual activities such as working, walking, prolonged standing, bending, lifting, yardwork, housework, sleeping, traveling, prolonged sitting, going to the beach, anything that requires  repetitive bending or lifting without difficulty. Patient will benefit from skilled physical therapy intervention to address current body structure impairments and activity limitations to improve function and work towards goals set in current POC in order to return to prior level of function or maximal functional improvement.   OBJECTIVE IMPAIRMENTS: Abnormal gait, decreased activity tolerance, decreased balance, decreased endurance, decreased knowledge of condition, decreased mobility, difficulty walking, decreased ROM, decreased strength, increased fascial restrictions, impaired perceived functional ability, increased muscle spasms, impaired flexibility, improper body mechanics, postural dysfunction, obesity, and pain.   ACTIVITY LIMITATIONS: carrying, lifting, bending, sitting, standing, squatting, stairs, transfers, hygiene/grooming, locomotion level, and caring for others  PARTICIPATION LIMITATIONS: meal prep, cleaning, laundry, driving, shopping, community activity, occupation, yard work, and   working, walking, prolonged standing, bending, lifting, yardwork, housework, sleeping, traveling, prolonged sitting, going to [employer], anything that requires repetitive bending or lifting   PERSONAL FACTORS: Fitness, Past/current experiences, Time since onset of injury/illness/exacerbation, and 1-2 comorbidities: lateral meniscus tear of left knee; Palpitation; Atypical chest pain; Family history of heart disease; Shortness of breath; and Sinus tachycardia on their problem list. past medical history of Acute meniscal tear of knee, Allergy, Anxiety, Asthma, Atrial fibrillation (HCC), COVID-19 (04/2020), Depression, Ovarian cyst, PVC (premature ventricular contraction), Scoliosis, Shingles, and Tachycardia. past surgical history that includes RIGHT INDEX  FINGER TENDON REPAIR (NOV 2013); Knee arthroscopy with lateral menisectomy (Left, 09/01/2012); and Meniscus repair (Right)  are also affecting patient's  functional outcome.   REHAB POTENTIAL: Good  CLINICAL DECISION MAKING: Evolving/moderate complexity  EVALUATION COMPLEXITY: Moderate   GOALS: Goals reviewed with patient? Yes  SHORT TERM GOALS: Target date: 08/07/22. Target date updated to 12/04/2022 on 11/19/2022.   Pt will be  independent with initial HEP in order to improve strength and balance in order to decrease fall risk and improve function at home and work. Baseline:HEP given; patient not participating (11/19/2022); participating regularly (12/02/2022);  Goal status: MET   LONG TERM GOALS: Target date: 08/26/22. Target date updated to 02/11/2023 for all unmet goals on 11/19/2022. Target date updated to 04/14/2023 for all unmet goals on 01/20/2023. Target date updated to 10/21/2023 or all unmet goals on 07/29/2023  Patient will demonstrate improved FOTO by equal or greater than 10 by visit #17 to demonstrate improvement in overall condition and self-reported functional ability.  Baseline: initial baseline 55 (07/01/2022); 60 at visit #17 (12/26/2022); 66 at visit #30 Goal status: MET (Discontinued 07/29/2023 due to clinic no longer having access to this measure).   2.  Pt will decrease worst pain as reported on NPRS by at least 3 points in order to demonstrate clinically significant reduction in pain. Baseline: 4/10 (07/01/2022): up to 10/10  (11/19/2022); up to 4-5/10 when burning two days ago, 2.5/10 when excluding the burning (12/06/2022); 3-4/10 in the last 2 weeks (01/20/2023); Pain is 1.5/10 over the past 2 weeks  Goal status: on-going  3.  Pt will demonstrate plank time of or more to demonstrate age-predicted match of core strength needed for job duties Baseline: 10sec (07/01/2022): 24 seconds (11/19/2022); 23 seconds (12/26/2022); 37 seconds (01/20/2023); 35 seconds (04/01/2023); 29 seconds (07/29/2023);  Goal status: In-progress  4.  Pt will demonstrate bering sorenson test of 34sec or more to demonstrate clinically significant increase in  spinal extensor strength needed for job duties Baseline: 20sec (07/01/2022); 58 seconds (11/19/2022); deferred(04/01/23); 54 seconds (07/29/2023);  Goal status: MET  5.  Patient will be independent with long term HEP program for self-management of symptoms. Baseline: HEP to be reviewed and updated as needed at visit 7 as appropriate (11/19/22); she has has been participating 2x a week plus PT sessions + what  is left over after PT session (12/26/2022); has not been doing since cholecystectomy on 06/30/2023 (07/29/2023);  Goal status: MET, no regressed (07/29/2023).    6.  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  Baseline: 40# (07/29/23); Goal status: NEW 07/29/2023  7.  Patient will demonstrate improvement in Patient Specific Functional Scale (PSFS) of equal or greater than 8/10 points to reflect clinically significant improvement in patient's most valued functional activities.. Baseline: 4/10 (07/29/23); Goal status: NEW 07/29/2023  PLAN:  PT FREQUENCY: 1-2x/week  PT DURATION: 6 weeks  PLANNED INTERVENTIONS: Therapeutic exercises, Therapeutic activity, Neuromuscular re-education, Balance training, Gait training, Patient/Family education, Self Care, Joint mobilization, Stair training, Dry Needling, Electrical stimulation, Spinal mobilization, Cryotherapy, Moist heat, Manual therapy, and Re-evaluation.  PLAN FOR NEXT SESSION:  Update HEP as appropriate, progressive core/LE/functional strengthening/motor control  exercises.    Carilyn Charles. Artemio Larry, PT, DPT 08/13/23, 8:41 AM  [facility] [address] [place], [place] [address] P: [phone] I F: [phone]

## 2023-08-12 NOTE — Patient Instructions (Signed)
 Here is the link to the bracing instructions:  [email or web address]

## 2023-08-13 ENCOUNTER — Other Ambulatory Visit: Payer: Self-pay

## 2023-08-13 ENCOUNTER — Encounter: Payer: Self-pay | Admitting: Physical Therapy

## 2023-08-13 MED ORDER — FAMOTIDINE 20 MG PO TABS
20.0000 mg | ORAL_TABLET | Freq: Two times a day (BID) | ORAL | 5 refills | Status: DC
  Filled 2023-08-13: qty 60, 30d supply, fill #0
  Filled 2023-09-23: qty 60, 30d supply, fill #1
  Filled 2023-10-21: qty 60, 30d supply, fill #2
  Filled 2024-02-09: qty 60, 30d supply, fill #3
  Filled 2024-03-17: qty 60, 30d supply, fill #4
  Filled 2024-04-19: qty 60, 30d supply, fill #5

## 2023-08-14 ENCOUNTER — Encounter: Payer: Self-pay | Admitting: Physical Therapy

## 2023-08-19 ENCOUNTER — Ambulatory Visit: Admitting: Physical Therapy

## 2023-08-19 ENCOUNTER — Ambulatory Visit: Payer: Self-pay | Admitting: Physical Therapy

## 2023-08-19 ENCOUNTER — Encounter: Payer: Self-pay | Admitting: Physical Therapy

## 2023-08-19 DIAGNOSIS — M792 Neuralgia and neuritis, unspecified: Secondary | ICD-10-CM | POA: Diagnosis not present

## 2023-08-19 DIAGNOSIS — M62838 Other muscle spasm: Secondary | ICD-10-CM | POA: Diagnosis not present

## 2023-08-19 DIAGNOSIS — M5459 Other low back pain: Secondary | ICD-10-CM

## 2023-08-19 NOTE — Therapy (Signed)
 OUTPATIENT PHYSICAL THERAPY TREATMENT   Patient Name: Theresa Ortiz MRN: [id number] DOB:1991/03/01, 33 y.o., female Today's Date: 08/19/2023  END OF SESSION:    PT End of Session - 08/19/23 1845     Visit Number 41    Number of Visits 60    Date for PT Re-Evaluation 10/21/23    Authorization Type [place] [employer] PPO reporting period from 08/12/2023    Progress Note Due on Visit 40    PT Start Time 1650    PT Stop Time 1735    PT Time Calculation (min) 45 min    Activity Tolerance Patient tolerated treatment well    Behavior During Therapy [facility] for tasks assessed/performed                     Past Medical History:  Diagnosis Date   Acute meniscal tear of knee    left knee   Allergy    Anxiety    Asthma    Atrial fibrillation (HCC)    slight   COVID-19 04/2020   Depression    GERD (gastroesophageal reflux disease)    Ovarian cyst    PVC (premature ventricular contraction)    Scoliosis    Shingles    Tachycardia    Past Surgical History:  Procedure Laterality Date   CHOLECYSTECTOMY N/A 07/01/2023   Procedure: LAPAROSCOPIC CHOLECYSTECTOMY;  Surgeon: Enid Harry, MD;  Location: [facility] OR;  Service: General;  Laterality: N/A;  GENERAL & TAP BLOCK   KNEE ARTHROSCOPY WITH LATERAL MENISECTOMY Left 09/01/2012   Procedure: LEFT KNEE ARTHROSCOPY WITH LATERAL MENISCECTOMY ;  Surgeon: Florencia Hunter, MD;  Location: [facility];  Service: Orthopedics;  Laterality: Left;   MENISCUS REPAIR Right    RIGHT INDEX  FINGER TENDON REPAIR  NOV 2013   Patient Active Problem List   Diagnosis Date Noted   Elevated liver enzymes 07/21/2023   Administrative encounter 07/21/2023   Helicobacter pylori ab+ 07/21/2023   Leukocytosis 07/21/2023   Microcytosis 07/21/2023   BMI 40.0-44.9, adult (HCC) 07/21/2023   Overactive bladder 07/21/2023   Personal history of COVID-19 07/21/2023   Asthma 04/03/2023   Gastro-esophageal reflux disease without esophagitis  01/22/2019   Palpitation 07/13/2018   Atypical chest pain 07/13/2018   Family history of heart disease 07/13/2018   Shortness of breath 07/13/2018   Sinus tachycardia 07/13/2018   Moderate recurrent major depression (HCC) 05/25/2018   Tobacco user 07/29/2014   Mild persistent asthma, uncomplicated 07/04/2014   Impaired fasting glucose 11/18/2012   Acute lateral meniscus tear of left knee 09/01/2012   Attention deficit hyperactivity disorder, predominantly inattentive type 05/03/2011    PCP: Tisovec, Kristina Pfeiffer, MD  REFERRING PROVIDER: Candi Chafe, MD  REFERRING DIAG: 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 Pt reports she has some pain at the mid calf that is the size of packing tape that feels like a cramp. She reports she has tension at L low back and glute. LBP/glute pain aggravated by lifting/moving patients, and bending forward. Has had L sided sciatic symptoms 1x/week when lifting something heavy down post LLE. She hs previously had pain  with prolonged walking, but that has subsided over the past year or so. Has had successful PT in the past with [facility], which she thinks is very helpful. Pt works full time as an Engineer, structural, enjoys traveling and walking her dog. Comorbid conditions as listed above. No falls in past 6 months. Pt denies N/V, B&B changes, unexplained weight fluctuation, saddle paresthesia, fever, night sweats, or unrelenting night pain at this time. Patient denies hx of cancer, stroke, seizures, diabetes, unexplained weight loss, unexplained changes in bowel or bladder problems, unexplained stumbling or dropping things,  osteoporosis, and spinal surgery.  SUBJECTIVE:                                                                                                                                                                                           SUBJECTIVE STATEMENT:   Patient states she has been getting pinching in the left hip when she steps and rotated on her left leg. She has been more sore (feels musculare) over her left glute. She states she noticed a day where the nerve pain also came to the front of her left thigh for a day. She states her right knee was sore after last PT session, and she thinks it was from the squats with the abdominal brace. She has done the breathing exercise 3 times since last PT session. She did not get through all the HEP 3 times, but only because she still struggles with making sure she breathes out.   PAIN: Are you having pain?  NPRS: Current: 6/10 pinching, 4/10 nerve pain Pain location: nerve pain down left lateral leg to knee, pinching in glute, "muscular" pain over left glute and low back.   Precautions: none  PATIENT GOALS: Get the cramp out of my calf, to "eleviate whatever pissed it off this time out of the leg"   OBJECTIVE (last measured 07/29/2023)   TODAY'S TREATMENT   Therapeutic exercise: therapeutic exercises that incorporate ONE parameter at one or more areas of the body to centralize symptoms, develop strength and endurance, range of motion, and flexibility required for successful completion of functional activities.  NuStep using bilateral upper and lower extremities. For improved extremity mobility, muscular endurance, and activity tolerance; and to induce the analgesic effect of aerobic exercise, stimulate improved joint nutrition, and prepare body structures and systems for following interventions. Also to reinforce understanding of appropriate exercise intensity to help meet physical activity guidelines for health.  Seat/handle setting: 9/9 5   minutes Level: 10-7 Target SPM: > 90 > 100 Average SPM: 83 RPE: 5-5.5/10 Arms are tired, concern for leg pain with pressing harder  Prone  press up with hands elevated on yoga blocks 1x10 Pain over left glute, worst on the first part of the eccentric phase After slightly better for a few steps, thigh pain gone, increased cramping sensation at left calf.   Seated knee extension on OMEGA machine, seat position 1 B LE: 1x15 at 15# L LE: 2x15 at 10# RPE 9-10/10 R LE: 2x15 at 10# RPE 6/10 No knee joint pain, muscle burn in quads  Therapeutic activities: dynamic therapeutic activities incorporating MULTIPLE parameters or areas of the body designed to achieve improved functional performance.  Single left step up on 4 inch step (asterisk sign) repeated throughout session before and after other interventions to assess effectiveness.   Neuromuscular Re-education: a technique or exercise performed with the goal of improving the level of communication between the body and the brain, such as for balance, motor control, muscle activation patterns, coordination, desensitization, quality of muscle contraction, proprioception, and/or kinesthetic sense needed for successful and safe completion of functional activities.   Practice of standing abdominal brace with 360 breathing ~ 10 breaths, difficulty getting 360 breathing so moved on to slightly different breathing strategy  Standing abdominal brace sequence while keeping glutes squeezed, great toes pressed into floor, and thighs externally rotating Inhale with abdominal relaxation Glottis close Abdominal brace (pulling in as if someone were going to punch you or jump on you).  Exhale/relax everything  Practiced about 5 reps then added squat (below)  Squat with valsalva abdominal brace (tried with exhale on concentric phase, but less confusing with breath hold).  Inhale with abdominal relaxation Glottis close Abdominal brace (pulling in as if  someone were going to punch you or jump on you).  Squat (eccentric and concentric) Exhale/relax everything  1x10 AROM 1x10 with 23# Barbell in back squat position 1x 8 with 23# Barbell in back squat position and  heels elevated on 2x4 board for 5 reps 1x3 AROM with hands held to mimic back squat position Went to AROM again when pt felt something "odd" in her middle lower thoracic spine after cue to let her chest come a bit more forwards in the squat (to prevent hyperextension of spine and keep ribs down).  Heavy concentration and cuing for improved form and braced core through movement. Felt she was doing it better at first. Overall showed significant improvement from last week, with much better use of glutes and abdominals - still may have some excessive movement between ribs and pelvis at certain points in the movement.   Manual therapy: to reduce pain and tissue tension, improve range of motion, neuromodulation, in order to promote improved ability to complete functional activities. HOOKLYING L hip LAD: 1x30 seconds Resolved pinching in buttocks and improved cramping in left calf  Pt required multimodal cuing for proper technique and to facilitate improved neuromuscular control, strength, range of motion, and functional ability resulting in improved performance and form.   PATIENT EDUCATION:  Education details: Exercise purpose/form. Self management techniques. Education on diagnosis, prognosis, POC, anatomy and physiology of current condition. Education on HEP including handout. Person educated: Patient Education method: Medical illustrator Education comprehension: verbalized understanding and returned demonstration, needs further education.   HOME EXERCISE PROGRAM: Access Code: [email or web address] URL: [email or web address] Date: 08/05/2023 Prepared by: Alleen Isle  Exercises - Bird Dog  - 3 x weekly - 2 sets - 10 reps - 5 second hold - Dead Bug  - 3 x weekly - 3  sets - 5-10 reps - Standard Plank  - 3 x weekly -  5 sets - 25 seconds hold - Standing Anti-Rotation Press with Anchored Resistance  - 3 x weekly - 3 sets - 10 reps - 5 seconds hold - Seated Diaphragmatic Breathing  - 3 x daily - 20 reps   Discontinued for now:   ASSESSMENT:  CLINICAL IMPRESSION:   Patient arrives with more leg pain and pinching in the posterior hip that was decreased and significantly relieved with LAD to left LE. She continued her progression of abdominal bracing with squat with good tolerance and progress. 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 Patient would benefit from continued management of limiting condition by skilled physical therapist to address remaining impairments and functional limitations to work towards stated goals and return to PLOF or maximal functional independence.   From Re-Evaluation 11/19/2022:  Patient is a 33 y.o. 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 Patient is returning to PT more than 90 days since last PT visit to continue care for the same condition as before. She completed 5 physical therapy sessions with improvement in symptoms before her attendance lapsed. She now presents with worsening left LE symptoms and low back and left glute pain since her work environment changed. She had good response to physical therapy intervention in the past and good potential for improvement again with consistent attendance. Patient did demonstrate improvement in trunk endurance at today's goal assessment. Patient presents with significant pain, paresthesia, muscle tension,  motor control, muscle performance (strength/power/endurance), and activity tolerance impairments that are limiting ability to complete her usual activities such as working, walking, prolonged standing, bending, lifting, yardwork, housework, sleeping, traveling, prolonged sitting, going to the beach, anything that requires repetitive bending or lifting without difficulty. Patient will benefit from skilled physical therapy intervention to address current body structure impairments and activity limitations to improve function and work towards goals set in current POC in order to return to prior level of function or maximal functional improvement.   OBJECTIVE IMPAIRMENTS: Abnormal gait, decreased activity tolerance, decreased balance, decreased endurance, decreased knowledge of condition, decreased mobility, difficulty walking, decreased ROM, decreased strength, increased fascial restrictions, impaired perceived functional ability, increased muscle spasms, impaired flexibility, improper body mechanics, postural dysfunction, obesity, and pain.   ACTIVITY LIMITATIONS: carrying, lifting, bending, sitting, standing, squatting, stairs, transfers, hygiene/grooming, locomotion level, and caring for others  PARTICIPATION LIMITATIONS: meal prep, cleaning, laundry, driving, shopping, community activity, occupation, yard work, and   working, walking, prolonged standing, bending, lifting, yardwork, housework, sleeping, traveling, prolonged sitting, going to [employer], anything that requires repetitive bending or lifting   PERSONAL FACTORS: Fitness, Past/current experiences, Time since onset of injury/illness/exacerbation, and 1-2 comorbidities: lateral meniscus tear of left knee; Palpitation; Atypical chest pain; Family history of heart disease; Shortness of breath; and Sinus tachycardia on their problem list. past medical history of Acute meniscal tear of knee, Allergy, Anxiety, Asthma, Atrial fibrillation (HCC), COVID-19  (04/2020), Depression, Ovarian cyst, PVC (premature ventricular contraction), Scoliosis, Shingles, and Tachycardia. past surgical history that includes RIGHT INDEX  FINGER TENDON REPAIR (NOV 2013); Knee arthroscopy with lateral menisectomy (Left, 09/01/2012); and Meniscus repair (Right)  are also affecting patient's functional outcome.   REHAB POTENTIAL: Good  CLINICAL  DECISION MAKING: Evolving/moderate complexity  EVALUATION COMPLEXITY: Moderate   GOALS: Goals reviewed with patient? Yes  SHORT TERM GOALS: Target date: 08/07/22. Target date updated to 12/04/2022 on 11/19/2022.   Pt will be independent with initial HEP in order to improve strength and balance in order to decrease fall risk and improve function at home and work. Baseline:HEP given; patient not participating (11/19/2022); participating regularly (12/02/2022);  Goal status: MET   LONG TERM GOALS: Target date: 08/26/22. Target date updated to 02/11/2023 for all unmet goals on 11/19/2022. Target date updated to 04/14/2023 for all unmet goals on 01/20/2023. Target date updated to 10/21/2023 or all unmet goals on 07/29/2023  Patient will demonstrate improved FOTO by equal or greater than 10 by visit #17 to demonstrate improvement in overall condition and self-reported functional ability.  Baseline: initial baseline 55 (07/01/2022); 60 at visit #17 (12/26/2022); 66 at visit #30 Goal status: MET (Discontinued 07/29/2023 due to clinic no longer having access to this measure).   2.  Pt will decrease worst pain as reported on NPRS by at least 3 points in order to demonstrate clinically significant reduction in pain. Baseline: 4/10 (07/01/2022): up to 10/10  (11/19/2022); up to 4-5/10 when burning two days ago, 2.5/10 when excluding the burning (12/06/2022); 3-4/10 in the last 2 weeks (01/20/2023); Pain is 1.5/10 over the past 2 weeks  Goal status: on-going  3.  Pt will demonstrate plank time of or more to demonstrate age-predicted match of core strength  needed for job duties Baseline: 10sec (07/01/2022): 24 seconds (11/19/2022); 23 seconds (12/26/2022); 37 seconds (01/20/2023); 35 seconds (04/01/2023); 29 seconds (07/29/2023);  Goal status: In-progress  4.  Pt will demonstrate bering sorenson test of 34sec or more to demonstrate clinically significant increase in spinal extensor strength needed for job duties Baseline: 20sec (07/01/2022); 58 seconds (11/19/2022); deferred(04/01/23); 54 seconds (07/29/2023);  Goal status: MET  5.  Patient will be independent with long term HEP program for self-management of symptoms. Baseline: HEP to be reviewed and updated as needed at visit 7 as appropriate (11/19/22); she has has been participating 2x a week plus PT sessions + what  is left over after PT session (12/26/2022); has not been doing since cholecystectomy on 06/30/2023 (07/29/2023);  Goal status: MET, no regressed (07/29/2023).    6.  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  Baseline: 40# (07/29/23); Goal status: NEW 07/29/2023  7.  Patient will demonstrate improvement in Patient Specific Functional Scale (PSFS) of equal or greater than 8/10 points to reflect clinically significant improvement in patient's most valued functional activities.. Baseline: 4/10 (07/29/23); Goal status: NEW 07/29/2023  PLAN:  PT FREQUENCY: 1-2x/week  PT DURATION: 6 weeks  PLANNED INTERVENTIONS: Therapeutic exercises, Therapeutic activity, Neuromuscular re-education, Balance training, Gait training, Patient/Family education, Self Care, Joint mobilization, Stair training, Dry Needling, Electrical stimulation, Spinal mobilization, Cryotherapy, Moist heat, Manual therapy, and Re-evaluation.  PLAN FOR NEXT SESSION:  Update HEP as appropriate, progressive core/LE/functional strengthening/motor control  exercises.    Carilyn Charles. Artemio Larry, PT, DPT 08/19/23, 7:00 PM  [facility] [address] [place], [place] [address] P: [phone] I F: [phone]

## 2023-08-21 ENCOUNTER — Encounter: Payer: Self-pay | Admitting: Physical Therapy

## 2023-08-26 ENCOUNTER — Encounter: Payer: Self-pay | Admitting: Physical Therapy

## 2023-08-26 ENCOUNTER — Ambulatory Visit: Payer: Self-pay | Admitting: Physical Therapy

## 2023-08-26 DIAGNOSIS — M62838 Other muscle spasm: Secondary | ICD-10-CM

## 2023-08-26 DIAGNOSIS — M792 Neuralgia and neuritis, unspecified: Secondary | ICD-10-CM | POA: Diagnosis not present

## 2023-08-26 DIAGNOSIS — M5459 Other low back pain: Secondary | ICD-10-CM

## 2023-08-26 NOTE — Therapy (Signed)
 OUTPATIENT PHYSICAL THERAPY TREATMENT   Patient Name: Theresa Ortiz MRN: [id number] DOB:Sep 21, 1990, 33 y.o., female Today's Date: 08/26/2023  END OF SESSION:    PT End of Session - 08/26/23 1747     Visit Number 42    Number of Visits 60    Date for PT Re-Evaluation 10/21/23    Authorization Type [place] [employer] P[employer] reporting period from 08/12/2023    Progress Note Due on Visit 40    PT Start Time 1735    PT Stop Time 1828    PT Time Calculation (min) 53 min    Activity Tolerance Patient tolerated treatment well    Behavior During Therapy [facility] for tasks assessed/performed                      Past Medical History:  Diagnosis Date   Acute meniscal tear of knee    left knee   Allergy    Anxiety    Asthma    Atrial fibrillation (HCC)    slight   COVID-19 04/2020   Depression    GERD (gastroesophageal reflux disease)    Ovarian cyst    PVC (premature ventricular contraction)    Scoliosis    Shingles    Tachycardia    Past Surgical History:  Procedure Laterality Date   CHOLECYSTECTOMY N/A 07/01/2023   Procedure: LAPAROSCOPIC CHOLECYSTECTOMY;  Surgeon: Enid Harry, MD;  Location: [facility] OR;  Service: General;  Laterality: N/A;  GENERAL & TAP BLOCK   KNEE ARTHROSCOPY WITH LATERAL MENISECTOMY Left 09/01/2012   Procedure: LEFT KNEE ARTHROSCOPY WITH LATERAL MENISCECTOMY ;  Surgeon: Florencia Hunter, MD;  Location: [facility];  Service: Orthopedics;  Laterality: Left;   MENISCUS REPAIR Right    RIGHT INDEX  FINGER TENDON REPAIR  NOV 2013   Patient Active Problem List   Diagnosis Date Noted   Elevated liver enzymes 07/21/2023   Administrative encounter 07/21/2023   Helicobacter pylori ab+ 07/21/2023   Leukocytosis 07/21/2023   Microcytosis 07/21/2023   BMI 40.0-44.9, adult (HCC) 07/21/2023   Overactive bladder 07/21/2023   Personal history of COVID-19 07/21/2023   Asthma 04/03/2023   Gastro-esophageal reflux disease without esophagitis  01/22/2019   Palpitation 07/13/2018   Atypical chest pain 07/13/2018   Family history of heart disease 07/13/2018   Shortness of breath 07/13/2018   Sinus tachycardia 07/13/2018   Moderate recurrent major depression (HCC) 05/25/2018   Tobacco user 07/29/2014   Mild persistent asthma, uncomplicated 07/04/2014   Impaired fasting glucose 11/18/2012   Acute lateral meniscus tear of left knee 09/01/2012   Attention deficit hyperactivity disorder, predominantly inattentive type 05/03/2011    PCP: Tisovec, Kristina Pfeiffer, MD  REFERRING PROVIDER: Candi Chafe, MD  REFERRING DIAG: 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 Pt reports she has some pain at the mid calf that is the size of packing tape that feels like a cramp. She reports she has tension at L low back and glute. LBP/glute pain aggravated by lifting/moving patients, and bending forward. Has had L sided sciatic symptoms 1x/week when lifting something heavy down post LLE. She hs previously had  pain with prolonged walking, but that has subsided over the past year or so. Has had successful PT in the past with [facility], which she thinks is very helpful. Pt works full time as an Engineer, structural, enjoys traveling and walking her dog. Comorbid conditions as listed above. No falls in past 6 months. Pt denies N/V, B&B changes, unexplained weight fluctuation, saddle paresthesia, fever, night sweats, or unrelenting night pain at this time. Patient denies hx of cancer, stroke, seizures, diabetes, unexplained weight loss, unexplained changes in bowel or bladder problems, unexplained stumbling or dropping things,  osteoporosis, and spinal surgery.  SUBJECTIVE:                                                                                                                                                                                           SUBJECTIVE STATEMENT:   Patient states she was really sore and the day after and the next day it felt like any step was going to "piss off" the spot on the left glute. After a few days it eased off and ow it is just really really sore. She has not been pushing herself to a breaking point at work anymore, which is helping her body. Her HEP has been fine, but the new squatting exercise with valsalva brace has been helpful.   PAIN: Are you having pain?  NPRS: Current: 0.5/10 pinching at left posterior hip, 1/10 nerve pain left lateral lower leg.  Pain location: nerve pain down left lateral leg to knee, pinching in glute, "muscular" pain over left glute and low back.   Precautions: none  PATIENT GOALS: Get the cramp out of my calf, to "eleviate whatever pissed it off this time out of the leg"   OBJECTIVE   TODAY'S TREATMENT   Therapeutic exercise: therapeutic exercises that incorporate ONE parameter at one or more areas of the body to centralize symptoms, develop strength and endurance, range of motion, and flexibility required for successful completion of functional activities.  NuStep using bilateral upper and lower extremities. For improved extremity mobility, muscular endurance, and activity tolerance; and to induce the analgesic effect of aerobic exercise, stimulate improved joint nutrition, and prepare body structures and systems for following interventions. Also to reinforce understanding of appropriate exercise intensity to help meet physical activity guidelines for health.  Seat/handle setting: 9/9 5  minutes Level: 7 Target SPM: > 100 Average SPM: 91 RPE: 5-5.5/10 Arms are tired, concern for leg pain with pressing harder  Seated knee extension on  OMEGA machine, seat position 1 B LE: 1x15 at 15# L LE: 2x15 at 15# RPE 9-10/10 R LE: 2x15 at 15# RPE  6/10 No knee joint pain, muscle burn in quads  Seated hamstring curl on OMEGA machine, seat position 1 B LE: 1x15 at 25# L LE: 1x15 at 25# R LE: 1x15 at 25# No knee joint pain, muscle burn in quads  Neuromuscular Re-education: a technique or exercise performed with the goal of improving the level of communication between the body and the brain, such as for balance, motor control, muscle activation patterns, coordination, desensitization, quality of muscle contraction, proprioception, and/or kinesthetic sense needed for successful and safe completion of functional activities.   Squat with valsalva abdominal brace Inhale with abdominal relaxation Glottis close Abdominal brace (pulling in as if someone were going to punch you or jump on you).  Squat (eccentric and concentric) Exhale/relax everything  1x10 AROM 1x10 with 23# Barbell in back squat position 2x 3 with 23# Barbell in back squat position Broke set to discuss increased pulling sensation at left hip adductors. Discontinued due to L knee feeling unstable (common for patient).  Improved form and braced core throughout. Continues to feel subjectively she is not keeping core braced at bottom of squat. Slight improvement with teaching her how to "wedge" the bar between upper traps and hands.   Manual therapy: to reduce pain and tissue tension, improve range of motion, neuromodulation, in order to promote improved ability to complete functional activities. HOOKLYING L hip LAD: 3x30 seconds Felt "looser" in left glute/hip region  Pt required multimodal cuing for proper technique and to facilitate improved neuromuscular control, strength, range of motion, and functional ability resulting in improved performance and form.   PATIENT EDUCATION:  Education details: Exercise purpose/form. Self management techniques. Education on  diagnosis, prognosis, POC, anatomy and physiology of current condition. Education on HEP including handout. Person educated: Patient Education method: Medical illustrator Education comprehension: verbalized understanding and returned demonstration, needs further education.   HOME EXERCISE PROGRAM: Access Code: [email or web address] URL: [email or web address] Date: 08/05/2023 Prepared by: Alleen Isle  Exercises - Bird Dog  - 3 x weekly - 2 sets - 10 reps - 5 second hold - Dead Bug  - 3 x weekly - 3 sets - 5-10 reps - Standard Plank  - 3 x weekly - 5 sets - 25 seconds hold - Standing Anti-Rotation Press with Anchored Resistance  - 3 x weekly - 3 sets - 10 reps - 5 seconds hold - Seated Diaphragmatic Breathing  - 3 x daily - 20 reps   Discontinued for now:   ASSESSMENT:  CLINICAL IMPRESSION:   Patient arrives with decreased leg pain but continues with mild pinching sensation in left glute and generalized TTP and soreness there. Continued with LE, core, and functional strengthening focused on improving strength and stability plus motor control. Patient tolerated well overall, got some relief from LAD to the left hip, but ultimately had increased pain in the lower left leg and mild increase in soreness in the left glute by end of session. Did not perform prone extensions due to suspicion it may have worsened her pain at past visits, although she does find it helpful at home. Patient would benefit from continued management of limiting condition by skilled physical therapist to address remaining impairments and functional limitations to work towards stated goals and return to PLOF or maximal functional independence. '  From Re-Evaluation 11/19/2022:  Patient is a 33 y.o. 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 Patient is returning to PT more than 90 days since last PT visit to continue care for the same condition as before. She completed 5 physical therapy sessions with improvement in symptoms before her attendance lapsed. She now presents with worsening left LE symptoms and low back and left glute pain since her work environment changed. She had good response to physical therapy intervention in the past and good potential for improvement again with consistent attendance. Patient did demonstrate improvement in trunk endurance at today's goal assessment. Patient presents with significant pain, paresthesia, muscle tension, motor control, muscle performance (strength/power/endurance), and activity tolerance impairments that are limiting ability to complete her usual activities such as working, walking, prolonged standing, bending, lifting, yardwork, housework, sleeping, traveling, prolonged sitting, going to the beach, anything that requires repetitive bending or lifting without difficulty. Patient will benefit from skilled physical therapy intervention to address current body structure impairments and activity limitations to improve function and work towards goals set in current POC in order to return to prior level of function or maximal functional improvement.   OBJECTIVE IMPAIRMENTS: Abnormal gait, decreased activity tolerance, decreased balance, decreased endurance, decreased knowledge of condition, decreased mobility, difficulty walking, decreased ROM, decreased strength, increased fascial restrictions, impaired perceived functional ability, increased muscle spasms, impaired flexibility, improper body mechanics, postural dysfunction, obesity, and pain.   ACTIVITY LIMITATIONS: carrying, lifting, bending, sitting, standing, squatting, stairs, transfers, hygiene/grooming, locomotion level, and caring for others  PARTICIPATION LIMITATIONS: meal prep,  cleaning, laundry, driving, shopping, community activity, occupation, yard work, and   working, walking, prolonged standing, bending, lifting, yardwork, housework, sleeping, traveling, prolonged sitting, going to [employer], anything that requires repetitive bending or lifting   PERSONAL FACTORS: Fitness, Past/current experiences, Time since onset of injury/illness/exacerbation, and 1-2 comorbidities: lateral meniscus tear of left knee; Palpitation; Atypical chest pain; Family history of heart disease; Shortness of breath; and Sinus tachycardia on their problem list. past medical history of Acute meniscal tear of knee, Allergy, Anxiety, Asthma, Atrial fibrillation (HCC), COVID-19 (04/2020), Depression, Ovarian cyst, PVC (premature ventricular contraction), Scoliosis, Shingles, and Tachycardia. past surgical history that includes RIGHT INDEX  FINGER TENDON REPAIR (NOV 2013); Knee arthroscopy with lateral menisectomy (Left, 09/01/2012); and Meniscus repair (Right)  are also affecting patient's functional outcome.   REHAB POTENTIAL: Good  CLINICAL DECISION MAKING: Evolving/moderate complexity  EVALUATION COMPLEXITY: Moderate   GOALS: Goals reviewed with patient? Yes  SHORT TERM GOALS: Target date: 08/07/22. Target date updated to 12/04/2022 on 11/19/2022.   Pt will be independent with initial HEP in order to improve strength and balance in order to decrease fall risk and improve function at home and work. Baseline:HEP given; patient not participating (11/19/2022); participating regularly (12/02/2022);  Goal status: MET   LONG TERM GOALS: Target date: 08/26/22. Target date updated to 02/11/2023 for all unmet goals on 11/19/2022. Target date updated to 04/14/2023 for all unmet goals on 01/20/2023. Target date updated to 10/21/2023 or all unmet goals on 07/29/2023  Patient will demonstrate improved FOTO by equal or greater than 10 by visit #17 to demonstrate improvement in overall condition and self-reported  functional ability.  Baseline: initial baseline 55 (07/01/2022); 60 at visit #17 (12/26/2022); 66 at visit #30 Goal status: MET (Discontinued 07/29/2023 due to clinic no longer having access to this measure).   2.  Pt will decrease worst pain as reported on NPRS by at least 3 points in order to demonstrate clinically significant reduction in  pain. Baseline: 4/10 (07/01/2022): up to 10/10  (11/19/2022); up to 4-5/10 when burning two days ago, 2.5/10 when excluding the burning (12/06/2022); 3-4/10 in the last 2 weeks (01/20/2023); Pain is 1.5/10 over the past 2 weeks  Goal status: on-going  3.  Pt will demonstrate plank time of or more to demonstrate age-predicted match of core strength needed for job duties Baseline: 10sec (07/01/2022): 24 seconds (11/19/2022); 23 seconds (12/26/2022); 37 seconds (01/20/2023); 35 seconds (04/01/2023); 29 seconds (07/29/2023);  Goal status: In-progress  4.  Pt will demonstrate bering sorenson test of 34sec or more to demonstrate clinically significant increase in spinal extensor strength needed for job duties Baseline: 20sec (07/01/2022); 58 seconds (11/19/2022); deferred(04/01/23); 54 seconds (07/29/2023);  Goal status: MET  5.  Patient will be independent with long term HEP program for self-management of symptoms. Baseline: HEP to be reviewed and updated as needed at visit 7 as appropriate (11/19/22); she has has been participating 2x a week plus PT sessions + what  is left over after PT session (12/26/2022); has not been doing since cholecystectomy on 06/30/2023 (07/29/2023);  Goal status: MET, no regressed (07/29/2023).    6.  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  Baseline: 40# (07/29/23); Goal status: NEW 07/29/2023  7.  Patient will demonstrate improvement in Patient Specific Functional Scale (PSFS) of equal or greater than 8/10 points to reflect clinically significant  improvement in patient's most valued functional activities.. Baseline: 4/10 (07/29/23); Goal status: NEW 07/29/2023  PLAN:  PT FREQUENCY: 1-2x/week  PT DURATION: 6 weeks  PLANNED INTERVENTIONS: Therapeutic exercises, Therapeutic activity, Neuromuscular re-education, Balance training, Gait training, Patient/Family education, Self Care, Joint mobilization, Stair training, Dry Needling, Electrical stimulation, Spinal mobilization, Cryotherapy, Moist heat, Manual therapy, and Re-evaluation.  PLAN FOR NEXT SESSION:  Update HEP as appropriate, progressive core/LE/functional strengthening/motor control  exercises.    Carilyn Charles. Artemio Larry, PT, DPT 08/26/23, 6:48 PM  [facility] [address] [place], [place] [address] P: [phone] I F: [phone]

## 2023-08-28 ENCOUNTER — Encounter: Payer: Self-pay | Admitting: Physical Therapy

## 2023-09-02 ENCOUNTER — Ambulatory Visit: Payer: Self-pay | Attending: Physical Medicine & Rehabilitation

## 2023-09-02 DIAGNOSIS — M62838 Other muscle spasm: Secondary | ICD-10-CM | POA: Insufficient documentation

## 2023-09-02 DIAGNOSIS — M792 Neuralgia and neuritis, unspecified: Secondary | ICD-10-CM | POA: Diagnosis not present

## 2023-09-02 DIAGNOSIS — M5459 Other low back pain: Secondary | ICD-10-CM | POA: Insufficient documentation

## 2023-09-02 NOTE — Therapy (Signed)
 OUTPATIENT PHYSICAL THERAPY TREATMENT   Patient Name: Theresa Ortiz MRN: [id number] DOB:1990-11-24, 33 y.o., female Today's Date: 09/02/2023  END OF SESSION:    PT End of Session - 09/02/23 1733     Visit Number 43    Number of Visits 60    Date for PT Re-Evaluation 10/21/23    Authorization Type [employer] P[employer] reporting period from 08/12/2023    Authorization Time Period VL: 25 PT/OT combined, Auth req after 25th visit    Progress Note Due on Visit 50    PT Start Time 1730    PT Stop Time 1810    PT Time Calculation (min) 40 min    Activity Tolerance Patient tolerated treatment well;No increased pain    Behavior During Therapy [facility] for tasks assessed/performed               Past Medical History:  Diagnosis Date   Acute meniscal tear of knee    left knee   Allergy    Anxiety    Asthma    Atrial fibrillation (HCC)    slight   COVID-19 04/2020   Depression    GERD (gastroesophageal reflux disease)    Ovarian cyst    PVC (premature ventricular contraction)    Scoliosis    Shingles    Tachycardia    Past Surgical History:  Procedure Laterality Date   CHOLECYSTECTOMY N/A 07/01/2023   Procedure: LAPAROSCOPIC CHOLECYSTECTOMY;  Surgeon: Enid Harry, MD;  Location: [facility] OR;  Service: General;  Laterality: N/A;  GENERAL & TAP BLOCK   KNEE ARTHROSCOPY WITH LATERAL MENISECTOMY Left 09/01/2012   Procedure: LEFT KNEE ARTHROSCOPY WITH LATERAL MENISCECTOMY ;  Surgeon: Florencia Hunter, MD;  Location: [facility];  Service: Orthopedics;  Laterality: Left;   MENISCUS REPAIR Right    RIGHT INDEX  FINGER TENDON REPAIR  NOV 2013   Patient Active Problem List   Diagnosis Date Noted   Elevated liver enzymes 07/21/2023   Administrative encounter 07/21/2023   Helicobacter pylori ab+ 07/21/2023   Leukocytosis 07/21/2023   Microcytosis 07/21/2023   BMI 40.0-44.9, adult (HCC) 07/21/2023   Overactive bladder 07/21/2023   Personal history of COVID-19  07/21/2023   Asthma 04/03/2023   Gastro-esophageal reflux disease without esophagitis 01/22/2019   Palpitation 07/13/2018   Atypical chest pain 07/13/2018   Family history of heart disease 07/13/2018   Shortness of breath 07/13/2018   Sinus tachycardia 07/13/2018   Moderate recurrent major depression (HCC) 05/25/2018   Tobacco user 07/29/2014   Mild persistent asthma, uncomplicated 07/04/2014   Impaired fasting glucose 11/18/2012   Acute lateral meniscus tear of left knee 09/01/2012   Attention deficit hyperactivity disorder, predominantly inattentive type 05/03/2011    PCP: Tisovec, Kristina Pfeiffer, MD  REFERRING PROVIDER: Candi Chafe, MD  REFERRING DIAG: 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 Pt reports she has some pain at the mid calf that is the size of packing tape that feels like a cramp. She reports she has tension at L low back and glute. LBP/glute pain aggravated by lifting/moving patients, and bending forward. Has had L sided sciatic symptoms 1x/week when  lifting something heavy down post LLE. She hs previously had pain with prolonged walking, but that has subsided over the past year or so. Has had successful PT in the past with [facility], which she thinks is very helpful. Pt works full time as an Engineer, structural, enjoys traveling and walking her dog. Comorbid conditions as listed above. No falls in past 6 months. Pt denies N/V, B&B changes, unexplained weight fluctuation, saddle paresthesia, fever, night sweats, or unrelenting night pain at this time. Patient denies hx of cancer, stroke, seizures, diabetes, unexplained weight loss, unexplained  changes in bowel or bladder problems, unexplained stumbling or dropping things, osteoporosis, and spinal surgery.  SUBJECTIVE:                                                                                                                                                                                           SUBJECTIVE STATEMENT:   Ankle pain getting worse, has been unable to do some HEP items. Sees [facility] on 5/9 for orthotic fitting to improve pain. Pt believes her Rt 5th digit (foot) is likely broken still.   PAIN: Are you having pain? None on arrival but Right ankle pain has been progressively worsening, particularly at work.   Precautions: none  PATIENT GOALS: Get the cramp out of my calf, to "eleviate whatever pissed it off this time out of the leg"   OBJECTIVE TODAY'S TREATMENT 09/02/23 NuStep recumbent quadruped stepper apparatus  Seat/handle setting: 9 and 9 respectively, 5 minutes at level 7  Inspection of Rt ankle due to c/o pain in lateral ankle and limitations at work and with HEP *no frank concerns for fracture in midfoot region,  *no resting pain, but has increased edema along the Rt anterior ankle mortise and along the Rt peroneal tendon pathway behind the lateral malleolus *increased pain in the anterior mortise area anterior to malleolus with end range PF, end range DF, inversion tolerated better *syndesmotic squeeze well tolerated  Education on role of motion control shoes, chronic ankle instability management, footwear modifications and symptoms response: would be helpful for pt to bring New Balance 860 into clinic for shod/unshod assessment of joint alignment and kinematics  Education on reduction of Rt leg dominanance in HEP squats using visual feedbakc at home Education on standardization of footwear and surfaceheight (squat depth) for consistent loading and joint engagement Integration of increased load for natural increase in core  bracing/proprioception  Air squats x12, depth ad lib (heavy Rt side deviation, ~50% depth, knee excursion far anterior to toes given current squat depth, no aggravation of ankle, typical for patient knee joint sounds  *pt  shod in Crocs (ethylene vinyl acetate clog with sports-mode heel strap)  Air squats to 19.5" surface height x12  Air squats to 19.5" surface height x12 with heels elevated on 5lb plates (for neutral ankle DF angle)  Air squats to 19.5" surface height x12 with heels elevated on 5lb plates with 40NU KB in goblet squat setting  Air squats to 19.5" surface height x12 with heels elevated on 5lb plates with 27OZ KB in 90 degrees bilat shoulder flexion 24ft 20lb KB carry for core engagement  Pt required multimodal cuing for proper technique and to facilitate improved neuromuscular control, strength, range of motion, and functional ability resulting in improved performance and form.   PATIENT EDUCATION:  Education details: Exercise purpose/form. Self management techniques. Education on diagnosis, prognosis, POC, anatomy and physiology of current condition. Education on HEP including handout. Person educated: Patient Education method: Medical illustrator Education comprehension: verbalized understanding and returned demonstration, needs further education.   HOME EXERCISE PROGRAM: Access Code: [email or web address] URL: [email or web address] Date: 08/05/2023 Prepared by: Alleen Isle  Exercises - Bird Dog  - 3 x weekly - 2 sets - 10 reps - 5 second hold - Dead Bug  - 3 x weekly - 3 sets - 5-10 reps - Standard Plank  - 3 x weekly - 5 sets - 25 seconds hold - Standing Anti-Rotation Press with Anchored Resistance  - 3 x weekly - 3 sets - 10 reps - 5 seconds hold - Seated Diaphragmatic Breathing  - 3 x daily - 20 reps   Discontinued for now:  ASSESSMENT:  CLINICAL IMPRESSION:   Pt having some new difficulty with Rt ankle pain, so time is taken to screen joint to  assure HEP relvance and safety. Education on standardization and modification to HEP squats, performed successfully without ankle irritation. Will continue to benefit from PT to achieve goals of care.   OBJECTIVE IMPAIRMENTS: Abnormal gait, decreased activity tolerance, decreased balance, decreased endurance, decreased knowledge of condition, decreased mobility, difficulty walking, decreased ROM, decreased strength, increased fascial restrictions, impaired perceived functional ability, increased muscle spasms, impaired flexibility, improper body mechanics, postural dysfunction, obesity, and pain.   ACTIVITY LIMITATIONS: carrying, lifting, bending, sitting, standing, squatting, stairs, transfers, hygiene/grooming, locomotion level, and caring for others  PARTICIPATION LIMITATIONS: meal prep, cleaning, laundry, driving, shopping, community activity, occupation, yard work, and   working, walking, prolonged standing, bending, lifting, yardwork, housework, sleeping, traveling, prolonged sitting, going to [employer], anything that requires repetitive bending or lifting   PERSONAL FACTORS: Fitness, Past/current experiences, Time since onset of injury/illness/exacerbation, and 1-2 comorbidities: lateral meniscus tear of left knee; Palpitation; Atypical chest pain; Family history of heart disease; Shortness of breath; and Sinus tachycardia on their problem list. past medical history of Acute meniscal tear of knee, Allergy, Anxiety, Asthma, Atrial fibrillation (HCC), COVID-19 (04/2020), Depression, Ovarian cyst, PVC (premature ventricular contraction), Scoliosis, Shingles, and Tachycardia. past surgical history that includes RIGHT INDEX  FINGER TENDON REPAIR (NOV 2013); Knee arthroscopy with lateral menisectomy (Left, 09/01/2012); and Meniscus repair (Right)  are also affecting patient's functional outcome.   REHAB POTENTIAL: Good  CLINICAL DECISION MAKING: Evolving/moderate complexity  EVALUATION COMPLEXITY:  Moderate  GOALS: Goals reviewed with patient? Yes  SHORT TERM GOALS: Target date: 08/07/22. Target date updated to 12/04/2022 on 11/19/2022.   Pt will be independent with initial HEP in order to improve strength and balance in order to decrease fall risk and improve function at home and work. Baseline:HEP given; patient not participating (11/19/2022); participating regularly (12/02/2022);  Goal  status: MET  LONG TERM GOALS: Target date: 08/26/22. Target date updated to 02/11/2023 for all unmet goals on 11/19/2022. Target date updated to 04/14/2023 for all unmet goals on 01/20/2023. Target date updated to 10/21/2023 or all unmet goals on 07/29/2023  Patient will demonstrate improved FOTO by equal or greater than 10 by visit #17 to demonstrate improvement in overall condition and self-reported functional ability.  Baseline: initial baseline 55 (07/01/2022); 60 at visit #17 (12/26/2022); 66 at visit #30 Goal status: MET (Discontinued 07/29/2023 due to clinic no longer having access to this measure).   2.  Pt will decrease worst pain as reported on NPRS by at least 3 points in order to demonstrate clinically significant reduction in pain. Baseline: 4/10 (07/01/2022): up to 10/10  (11/19/2022); up to 4-5/10 when burning two days ago, 2.5/10 when excluding the burning (12/06/2022); 3-4/10 in the last 2 weeks (01/20/2023); Pain is 1.5/10 over the past 2 weeks  Goal status: on-going  3.  Pt will demonstrate plank time of or more to demonstrate age-predicted match of core strength needed for job duties Baseline: 10sec (07/01/2022): 24 seconds (11/19/2022); 23 seconds (12/26/2022); 37 seconds (01/20/2023); 35 seconds (04/01/2023); 29 seconds (07/29/2023);  Goal status: In-progress  4.  Pt will demonstrate bering sorenson test of 34sec or more to demonstrate clinically significant increase in spinal extensor strength needed for job duties Baseline: 20sec (07/01/2022); 58 seconds (11/19/2022); deferred(04/01/23); 54 seconds  (07/29/2023);  Goal status: MET  5.  Patient will be independent with long term HEP program for self-management of symptoms. Baseline: HEP to be reviewed and updated as needed at visit 7 as appropriate (11/19/22); she has has been participating 2x a week plus PT sessions + what  is left over after PT session (12/26/2022); has not been doing since cholecystectomy on 06/30/2023 (07/29/2023);  Goal status: MET, no regressed (07/29/2023).   6.  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  Baseline: 40# (07/29/23); Goal status: NEW 07/29/2023  7.  Patient will demonstrate improvement in Patient Specific Functional Scale (PSFS) of equal or greater than 8/10 points to reflect clinically significant improvement in patient's most valued functional activities.. Baseline: 4/10 (07/29/23); Goal status: NEW 07/29/2023  PLAN:  PT FREQUENCY: 1-2x/week  PT DURATION: 6 weeks  PLANNED INTERVENTIONS: Therapeutic exercises, Therapeutic activity, Neuromuscular re-education, Balance training, Gait training, Patient/Family education, Self Care, Joint mobilization, Stair training, Dry Needling, Electrical stimulation, Spinal mobilization, Cryotherapy, Moist heat, Manual therapy, and Re-evaluation.  PLAN FOR NEXT SESSION:  Update HEP as appropriate, progressive core/LE/functional strengthening/motor control  exercises.    5:39 PM, 09/02/23 Dawn Eth, PT, DPT Physical Therapist - [place] [phone] (Office)

## 2023-09-04 ENCOUNTER — Encounter: Payer: Self-pay | Admitting: Physical Therapy

## 2023-09-05 ENCOUNTER — Ambulatory Visit

## 2023-09-09 ENCOUNTER — Telehealth: Payer: Self-pay

## 2023-09-09 ENCOUNTER — Encounter: Payer: Self-pay | Admitting: Physical Therapy

## 2023-09-09 ENCOUNTER — Ambulatory Visit: Payer: Self-pay

## 2023-09-09 DIAGNOSIS — M62838 Other muscle spasm: Secondary | ICD-10-CM | POA: Diagnosis not present

## 2023-09-09 DIAGNOSIS — M792 Neuralgia and neuritis, unspecified: Secondary | ICD-10-CM | POA: Diagnosis not present

## 2023-09-09 DIAGNOSIS — M5459 Other low back pain: Secondary | ICD-10-CM | POA: Diagnosis not present

## 2023-09-09 NOTE — Therapy (Signed)
 OUTPATIENT PHYSICAL THERAPY TREATMENT   Patient Name: Theresa Ortiz MRN: [id number] DOB:1990/06/04, 33 y.o., female Today's Date: 09/09/2023  END OF SESSION:    PT End of Session - 09/09/23 1607     Visit Number 44    Number of Visits 60    Date for PT Re-Evaluation 10/21/23    Authorization Type [place] [employer] P[employer] reporting period from 08/12/2023    Authorization Time Period VL: 25 PT/OT combined, Auth req after 25th visit    Progress Note Due on Visit 50    PT Start Time 1602    PT Stop Time 1645    PT Time Calculation (min) 43 min    Activity Tolerance Patient tolerated treatment well;No increased pain    Behavior During Therapy [facility] for tasks assessed/performed               Past Medical History:  Diagnosis Date   Acute meniscal tear of knee    left knee   Allergy    Anxiety    Asthma    Atrial fibrillation (HCC)    slight   COVID-19 04/2020   Depression    GERD (gastroesophageal reflux disease)    Ovarian cyst    PVC (premature ventricular contraction)    Scoliosis    Shingles    Tachycardia    Past Surgical History:  Procedure Laterality Date   CHOLECYSTECTOMY N/A 07/01/2023   Procedure: LAPAROSCOPIC CHOLECYSTECTOMY;  Surgeon: Enid Harry, MD;  Location: [facility] OR;  Service: General;  Laterality: N/A;  GENERAL & TAP BLOCK   KNEE ARTHROSCOPY WITH LATERAL MENISECTOMY Left 09/01/2012   Procedure: LEFT KNEE ARTHROSCOPY WITH LATERAL MENISCECTOMY ;  Surgeon: Florencia Hunter, MD;  Location: [facility];  Service: Orthopedics;  Laterality: Left;   MENISCUS REPAIR Right    RIGHT INDEX  FINGER TENDON REPAIR  NOV 2013   Patient Active Problem List   Diagnosis Date Noted   Elevated liver enzymes 07/21/2023   Administrative encounter 07/21/2023   Helicobacter pylori ab+ 07/21/2023   Leukocytosis 07/21/2023   Microcytosis 07/21/2023   BMI 40.0-44.9, adult (HCC) 07/21/2023   Overactive bladder 07/21/2023   Personal history of COVID-19  07/21/2023   Asthma 04/03/2023   Gastro-esophageal reflux disease without esophagitis 01/22/2019   Palpitation 07/13/2018   Atypical chest pain 07/13/2018   Family history of heart disease 07/13/2018   Shortness of breath 07/13/2018   Sinus tachycardia 07/13/2018   Moderate recurrent major depression (HCC) 05/25/2018   Tobacco user 07/29/2014   Mild persistent asthma, uncomplicated 07/04/2014   Impaired fasting glucose 11/18/2012   Acute lateral meniscus tear of left knee 09/01/2012   Attention deficit hyperactivity disorder, predominantly inattentive type 05/03/2011    PCP: Tisovec, Kristina Pfeiffer, MD  REFERRING PROVIDER: Candi Chafe, MD  REFERRING DIAG: 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 Pt reports she has some pain at the mid calf that is the size of packing tape that feels like a cramp. She reports she has tension at L low back and glute. LBP/glute pain aggravated by lifting/moving patients, and bending forward. Has had L sided sciatic symptoms 1x/week when  lifting something heavy down post LLE. She hs previously had pain with prolonged walking, but that has subsided over the past year or so. Has had successful PT in the past with [facility], which she thinks is very helpful. Pt works full time as an Engineer, structural, enjoys traveling and walking her dog. Comorbid conditions as listed above. No falls in past 6 months. Pt denies N/V, B&B changes, unexplained weight fluctuation, saddle paresthesia, fever, night sweats, or unrelenting night pain at this time. Patient denies hx of cancer, stroke, seizures, diabetes, unexplained weight loss, unexplained  changes in bowel or bladder problems, unexplained stumbling or dropping things, osteoporosis, and spinal surgery.  SUBJECTIVE:                                                                                                                                                                                           SUBJECTIVE STATEMENT:   Pt reports her ankle pain continues to be bothersome. Unable to get in with podiatry for her orthotics on the 20th. She is unable to wear he ankle brace except with her crocs and that affects her gait which leads to aggravating her back.   PAIN: Are you having pain? None on arrival but Right ankle pain has been progressively worsening, particularly at work.   Precautions: none  PATIENT GOALS: Get the cramp out of my calf, to "eleviate whatever pissed it off this time out of the leg"   OBJECTIVE TODAY'S TREATMENT 09/09/23 NuStep recumbent quadruped stepper apparatus  Seat/handle setting: 9 and 9 respectively, 5 minutes at level 7  Hook lying L hamstring stretch: 3x30 sec   Hook lying L hamstring stretch with hip adduction: 3x30 sec  Hook lying SKTC: 3x30 sec  Seated knee extension on OMEGA machine, seat position 1 B LE: 1x15 at 15# BLE: 2x10, 20#  RLE: 2x10, 20# LLE: 2x10, 20#  Seated hamstring curl:   BLE: x15, 35#   RLE: 2x8, 25#  LLE: 2x8, 25#  Side stepping hip abduction with GTB: 4x10   Bent over hip extension with 2# AW's: 2x12/LE   Pt required multimodal cuing for proper technique and to facilitate improved neuromuscular control, strength, range of motion, and functional ability resulting in improved performance and form.   PATIENT EDUCATION:  Education details: Exercise purpose/form. Self management techniques. Education on diagnosis, prognosis, POC, anatomy and physiology of current condition. Education on HEP including handout. Person educated: Patient Education method: Medical illustrator Education comprehension:  verbalized understanding and returned demonstration, needs further education.   HOME EXERCISE PROGRAM: Access Code: [email or web address] URL: [email or web address] Date: 08/05/2023 Prepared by: Alleen Isle  Exercises - Adele Admire  Dog  - 3 x weekly - 2 sets - 10 reps - 5 second hold - Dead Bug  - 3 x weekly - 3 sets - 5-10 reps - Standard Plank  - 3 x weekly - 5 sets - 25 seconds hold - Standing Anti-Rotation Press with Anchored Resistance  - 3 x weekly - 3 sets - 10 reps - 5 seconds hold - Seated Diaphragmatic Breathing  - 3 x daily - 20 reps   Discontinued for now:  ASSESSMENT:  CLINICAL IMPRESSION:   Utilization of flexibility and OKC exercises performed today to adapt to continuous R ankle pain with prior therapies. Pt able to progress in resistance with OMEGA exercises without onset of L sided LBP. Encouraged f/u with primary PT on further management of R ankle along with podiatry. Will continue to benefit from PT to achieve goals of care.   OBJECTIVE IMPAIRMENTS: Abnormal gait, decreased activity tolerance, decreased balance, decreased endurance, decreased knowledge of condition, decreased mobility, difficulty walking, decreased ROM, decreased strength, increased fascial restrictions, impaired perceived functional ability, increased muscle spasms, impaired flexibility, improper body mechanics, postural dysfunction, obesity, and pain.   ACTIVITY LIMITATIONS: carrying, lifting, bending, sitting, standing, squatting, stairs, transfers, hygiene/grooming, locomotion level, and caring for others  PARTICIPATION LIMITATIONS: meal prep, cleaning, laundry, driving, shopping, community activity, occupation, yard work, and  working, walking, prolonged standing, bending, lifting, yardwork, housework, sleeping, traveling, prolonged sitting, going to [employer], anything that requires repetitive bending or lifting   PERSONAL FACTORS: Fitness, Past/current experiences, Time since onset of  injury/illness/exacerbation, and 1-2 comorbidities: lateral meniscus tear of left knee; Palpitation; Atypical chest pain; Family history of heart disease; Shortness of breath; and Sinus tachycardia on their problem list. past medical history of Acute meniscal tear of knee, Allergy, Anxiety, Asthma, Atrial fibrillation (HCC), COVID-19 (04/2020), Depression, Ovarian cyst, PVC (premature ventricular contraction), Scoliosis, Shingles, and Tachycardia. past surgical history that includes RIGHT INDEX  FINGER TENDON REPAIR (NOV 2013); Knee arthroscopy with lateral menisectomy (Left, 09/01/2012); and Meniscus repair (Right) are also affecting patient's functional outcome.   REHAB POTENTIAL: Good  CLINICAL DECISION MAKING: Evolving/moderate complexity  EVALUATION COMPLEXITY: Moderate  GOALS: Goals reviewed with patient? Yes  SHORT TERM GOALS: Target date: 08/07/22. Target date updated to 12/04/2022 on 11/19/2022.   Pt will be independent with initial HEP in order to improve strength and balance in order to decrease fall risk and improve function at home and work. Baseline:HEP given; patient not participating (11/19/2022); participating regularly (12/02/2022);  Goal status: MET  LONG TERM GOALS: Target date: 08/26/22. Target date updated to 02/11/2023 for all unmet goals on 11/19/2022. Target date updated to 04/14/2023 for all unmet goals on 01/20/2023. Target date updated to 10/21/2023 or all unmet goals on 07/29/2023  Patient will demonstrate improved FOTO by equal or greater than 10 by visit #17 to demonstrate improvement in overall condition and self-reported functional ability.  Baseline: initial baseline 55 (07/01/2022); 60 at visit #17 (12/26/2022); 66 at visit #30 Goal status: MET (Discontinued 07/29/2023 due to clinic no longer having access to this measure).   2.  Pt will decrease worst pain as reported on NPRS by at least 3 points in order to demonstrate clinically significant reduction in pain. Baseline: 4/10  (07/01/2022): up to 10/10  (11/19/2022); up to 4-5/10 when burning two days ago, 2.5/10 when excluding the burning (12/06/2022); 3-4/10 in the last 2 weeks (01/20/2023); Pain is 1.5/10 over the past 2 weeks  Goal status: on-going  3.  Pt will demonstrate plank  time of or more to demonstrate age-predicted match of core strength needed for job duties Baseline: 10sec (07/01/2022): 24 seconds (11/19/2022); 23 seconds (12/26/2022); 37 seconds (01/20/2023); 35 seconds (04/01/2023); 29 seconds (07/29/2023);  Goal status: In-progress  4.  Pt will demonstrate bering sorenson test of 34sec or more to demonstrate clinically significant increase in spinal extensor strength needed for job duties Baseline: 20sec (07/01/2022); 58 seconds (11/19/2022); deferred(04/01/23); 54 seconds (07/29/2023);  Goal status: MET  5.  Patient will be independent with long term HEP program for self-management of symptoms. Baseline: HEP to be reviewed and updated as needed at visit 7 as appropriate (11/19/22); she has has been participating 2x a week plus PT sessions + what  is left over after PT session (12/26/2022); has not been doing since cholecystectomy on 06/30/2023 (07/29/2023);  Goal status: MET, no regressed (07/29/2023).   6.  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  Baseline: 40# (07/29/23); Goal status: NEW 07/29/2023  7.  Patient will demonstrate improvement in Patient Specific Functional Scale (PSFS) of equal or greater than 8/10 points to reflect clinically significant improvement in patient's most valued functional activities.. Baseline: 4/10 (07/29/23); Goal status: NEW 07/29/2023  PLAN:  PT FREQUENCY: 1-2x/week  PT DURATION: 6 weeks  PLANNED INTERVENTIONS: Therapeutic exercises, Therapeutic activity, Neuromuscular re-education, Balance training, Gait training, Patient/Family education, Self Care, Joint mobilization, Stair  training, Dry Needling, Electrical stimulation, Spinal mobilization, Cryotherapy, Moist heat, Manual therapy, and Re-evaluation.  PLAN FOR NEXT SESSION:  Update HEP as appropriate, progressive core/LE/functional strengthening/motor control  exercises.    Marc Senior. Fairly IV, PT, DPT Physical Therapist- [place]  [facility]  6:19 PM, 09/09/23

## 2023-09-09 NOTE — Telephone Encounter (Signed)
LVM to resched

## 2023-09-11 ENCOUNTER — Encounter: Payer: Self-pay | Admitting: Physical Therapy

## 2023-09-16 ENCOUNTER — Other Ambulatory Visit

## 2023-09-16 ENCOUNTER — Ambulatory Visit: Payer: Commercial Managed Care - PPO | Admitting: Physical Therapy

## 2023-09-16 DIAGNOSIS — M5459 Other low back pain: Secondary | ICD-10-CM | POA: Diagnosis not present

## 2023-09-16 DIAGNOSIS — M62838 Other muscle spasm: Secondary | ICD-10-CM | POA: Diagnosis not present

## 2023-09-16 DIAGNOSIS — M792 Neuralgia and neuritis, unspecified: Secondary | ICD-10-CM | POA: Diagnosis not present

## 2023-09-16 NOTE — Therapy (Signed)
 OUTPATIENT PHYSICAL THERAPY TREATMENT   Patient Name: Theresa Ortiz MRN: [id number] DOB:Sep 19, 1990, 33 y.o., female Today's Date: 09/16/2023  END OF SESSION:    PT End of Session - 09/16/23 1736     Visit Number 45    Number of Visits 60    Date for PT Re-Evaluation 10/21/23    Authorization Type [place] [employer] PPO reporting period from 08/12/2023    Progress Note Due on Visit 50    PT Start Time 1736    PT Stop Time 1815    PT Time Calculation (min) 39 min    Activity Tolerance Patient tolerated treatment well;No increased pain    Behavior During Therapy [facility] for tasks assessed/performed                Past Medical History:  Diagnosis Date   Acute meniscal tear of knee    left knee   Allergy    Anxiety    Asthma    Atrial fibrillation (HCC)    slight   COVID-19 04/2020   Depression    GERD (gastroesophageal reflux disease)    Ovarian cyst    PVC (premature ventricular contraction)    Scoliosis    Shingles    Tachycardia    Past Surgical History:  Procedure Laterality Date   CHOLECYSTECTOMY N/A 07/01/2023   Procedure: LAPAROSCOPIC CHOLECYSTECTOMY;  Surgeon: Enid Harry, MD;  Location: [facility] OR;  Service: General;  Laterality: N/A;  GENERAL & TAP BLOCK   KNEE ARTHROSCOPY WITH LATERAL MENISECTOMY Left 09/01/2012   Procedure: LEFT KNEE ARTHROSCOPY WITH LATERAL MENISCECTOMY ;  Surgeon: Florencia Hunter, MD;  Location: [facility];  Service: Orthopedics;  Laterality: Left;   MENISCUS REPAIR Right    RIGHT INDEX  FINGER TENDON REPAIR  NOV 2013   Patient Active Problem List   Diagnosis Date Noted   Elevated liver enzymes 07/21/2023   Administrative encounter 07/21/2023   Helicobacter pylori ab+ 07/21/2023   Leukocytosis 07/21/2023   Microcytosis 07/21/2023   BMI 40.0-44.9, adult (HCC) 07/21/2023   Overactive bladder 07/21/2023   Personal history of COVID-19 07/21/2023   Asthma 04/03/2023   Gastro-esophageal reflux disease without  esophagitis 01/22/2019   Palpitation 07/13/2018   Atypical chest pain 07/13/2018   Family history of heart disease 07/13/2018   Shortness of breath 07/13/2018   Sinus tachycardia 07/13/2018   Moderate recurrent major depression (HCC) 05/25/2018   Tobacco user 07/29/2014   Mild persistent asthma, uncomplicated 07/04/2014   Impaired fasting glucose 11/18/2012   Acute lateral meniscus tear of left knee 09/01/2012   Attention deficit hyperactivity disorder, predominantly inattentive type 05/03/2011    PCP: Tisovec, Kristina Pfeiffer, MD  REFERRING PROVIDER: Candi Chafe, MD  REFERRING DIAG: 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 Pt reports she has some pain at the mid calf that is the size of packing tape that feels like a cramp. She reports she has tension at L low back and glute. LBP/glute pain aggravated by lifting/moving patients, and bending forward. Has had L sided sciatic symptoms 1x/week when lifting something heavy down post LLE. She hs previously had pain with prolonged walking,  but that has subsided over the past year or so. Has had successful PT in the past with [facility], which she thinks is very helpful. Pt works full time as an Engineer, structural, enjoys traveling and walking her dog. Comorbid conditions as listed above. No falls in past 6 months. Pt denies N/V, B&B changes, unexplained weight fluctuation, saddle paresthesia, fever, night sweats, or unrelenting night pain at this time. Patient denies hx of cancer, stroke, seizures, diabetes, unexplained weight loss, unexplained changes in bowel or bladder problems, unexplained stumbling or dropping  things, osteoporosis, and spinal surgery.  SUBJECTIVE:                                                                                                                                                                                           SUBJECTIVE STATEMENT:   Patient states she has not been able to pick up her orthotics yet because they don't have them made yet. Her new appointment is June 20th. Her back is feeling pretty good right now. Her R ankle has been hurting and started hurting at work on at work the same day she re-broke her toe (later when she came home). It was a really hectic day at work that day but she did not twist it. It feels like she rolled it but she didn't. She has been wearing donjoy brace since then that has been keeping her able to work. [facility] her the ankle brace in the past. Pain fluctuates but it is better about the 3rd day off work, then flairs up again while working.   PAIN: NPRS: soreness 1/10 over left glute, 5/10 right sinus tarsi region.   None on arrival but Right ankle pain has been progressively worsening, particularly at work.   Precautions: none  PATIENT GOALS: Get the cramp out of my calf, to "eleviate whatever pissed it off this time out of the leg"   OBJECTIVE  DIAGNOSTIC TESTING Ankle xray report from Dr. Acquanetta Acre note on 07/21/2023 (visit for bilateral ankle pain):  Graphs of the bilateral ankle taken today demonstrate osseously mature individual right ankle does demonstrate dorsal exostosis at the talar neck. Otherwise medial and lateral malleolar osteophytes are present.. She has a f similar finding to the left ankle joint as well.   STRENGTH:  Ankle (seated position) Dorsiflexion: R = 4+/5* Eversion: R = 5/5, Inversion: R = 4/5*  Gait: pain during each part of stance phase on R  Manual assessment R ankle:  TTP surrounding lateral malleolus, sinus tarsi and anterior talocrual joint line, minimally tender at  medial talocrual joint line and around medial  malleolus compared to L ankle.  Rearfoot valgus and varus OP non painful  Subtalar lateral and medial glide non painful but does slightly catch/pop Anterior drawer uncomfortable and slightly guarded  TODAY'S TREATMENT  Manual therapy: to reduce pain and tissue tension, improve range of motion, neuromodulation, in order to promote improved ability to complete functional activities. Manual assessment of R ankle (see above) Distraction mobilization at R talocrual joint, grade III-IV, 3-5 reps (popping but not painful).  R subtalar medial and lateral glides grade III-IV, 5 second each direction.   Therapeutic exercise: therapeutic exercises that incorporate ONE parameter at one or more areas of the body to centralize symptoms, develop strength and endurance, range of motion, and flexibility required for successful completion of functional activities.  NuStep using bilateral upper and lower extremities. For improved extremity mobility, muscular endurance, and activity tolerance; and to induce the analgesic effect of aerobic exercise, stimulate improved joint nutrition, and prepare body structures and systems for following interventions. Also to reinforce understanding of appropriate exercise intensity to help meet physical activity guidelines for health.  Seat/handle setting: 9/9 5 minutes Level: 7 Target SPM: > 100 Average SPM: 86 RPE: 5.5/10  Review of ankle brace: pt finds the one she is wearing painful where the plastic D rings bind at the front of her ankle. Prefers a lace-up with islets (she thinks she has one at home from the past). Both ankle braces provided heel lock support with firm straps with is the recommended type by PT.   Seated B ankle eversion:  Isometrics with feet crossed Several reps with shoes donned, brace only donned, and just socks. Pt has trouble feeling exercise.  Eversion against TB loop with small ball between  ankles 1x10 GTB 2x20 RTB (better excursion) Patient with difficulty isolating ankle moment but was able to learn it with practice and cuing. Helps to press fist between knees.  "Sore" but not really painful per patient Added to HEP and provided ball and RTB  Seated ankle R ankle inversion with theraband loop (foot to foot) 1x20 or more with RTB Cuing for form Added to HEP and provided RTB  Seated R ankle plantar flexion against theraband 1x20 with Black TB Easy Added to HEP and provided black TB (recommended Silver  that she has at home, but to regress to black if silver  is too uncomfortable).   Education about strengthening ankle and trial of over the counter shoe inserts while awaiting custom made inserts.    Pt required multimodal cuing for proper technique and to facilitate improved neuromuscular control, strength, range of motion, and functional ability resulting in improved performance and form.   PATIENT EDUCATION:  Education details: Exercise purpose/form. Self management techniques. Education on diagnosis, prognosis, POC, anatomy and physiology of current condition. Education on HEP including handout. Person educated: Patient Education method: Medical illustrator Education comprehension: verbalized understanding and returned demonstration, needs further education.   HOME EXERCISE PROGRAM: Access Code: [email or web address] URL: [email or web address] Date: 08/05/2023 Prepared by: Alleen Isle  Exercises - Bird Dog  - 3 x weekly - 2 sets - 10 reps - 5 second hold - Dead Bug  - 3 x weekly - 3 sets - 5-10 reps - Standard Plank  - 3 x weekly - 5 sets - 25 seconds hold - Standing Anti-Rotation Press with Anchored Resistance  - 3 x weekly - 3 sets - 10 reps - 5 seconds hold - Seated Diaphragmatic Breathing  - 3 x daily - 20 reps    ASSESSMENT:  CLINICAL IMPRESSION:   Patient arrives with fairly quiet low back pain but continuing to have a lot of trouble with  R ankle. 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 Patient's ankle pain consistent with acute flair of chronic ankle instability and she was provided strengthening exercises for her ankle within tolerance to help improve stability and symptoms. This is to help prevent her ankle issues from leading to worsening back pain if her treatment is excalated to boot wearing or surgery, or from her altered gait pattern from the current pain she is having in the ankle. Patient would benefit from continued management of limiting condition by skilled physical therapist to address remaining impairments and functional limitations to work towards stated goals and return to PLOF or maximal functional independence.    OBJECTIVE IMPAIRMENTS: Abnormal gait, decreased activity tolerance, decreased balance, decreased endurance, decreased knowledge of condition, decreased mobility, difficulty walking, decreased ROM, decreased strength, increased fascial restrictions, impaired perceived functional ability, increased muscle spasms, impaired flexibility, improper body mechanics, postural dysfunction, obesity, and pain.   ACTIVITY LIMITATIONS: carrying, lifting, bending, sitting, standing, squatting, stairs, transfers, hygiene/grooming, locomotion level, and caring for others  PARTICIPATION LIMITATIONS: meal prep, cleaning, laundry, driving, shopping, community activity, occupation, yard work, and  working, walking, prolonged standing, bending, lifting, yardwork, housework, sleeping, traveling, prolonged sitting, going to [employer], anything that requires repetitive bending or lifting   PERSONAL FACTORS: Fitness, Past/current experiences, Time since onset of injury/illness/exacerbation, and 1-2 comorbidities: lateral meniscus tear of left knee; Palpitation; Atypical chest  pain; Family history of heart disease; Shortness of breath; and Sinus tachycardia on their problem list. past medical history of Acute meniscal tear of knee, Allergy, Anxiety, Asthma, Atrial fibrillation (HCC), COVID-19 (04/2020), Depression, Ovarian cyst, PVC (premature ventricular contraction), Scoliosis, Shingles, and Tachycardia. past surgical history that includes RIGHT INDEX  FINGER TENDON REPAIR (NOV 2013); Knee arthroscopy with lateral menisectomy (Left, 09/01/2012); and Meniscus repair (Right) are also affecting patient's functional outcome.   REHAB POTENTIAL: Good  CLINICAL DECISION MAKING: Evolving/moderate complexity  EVALUATION COMPLEXITY: Moderate  GOALS: Goals reviewed with patient? Yes  SHORT TERM GOALS: Target date: 08/07/22. Target date updated to 12/04/2022 on 11/19/2022.   Pt will be independent with initial HEP in order to improve strength and balance in order to decrease fall risk and improve function at home and work. Baseline:HEP given; patient not participating (11/19/2022); participating regularly (12/02/2022);  Goal status: MET  LONG TERM GOALS: Target date: 08/26/22. Target date updated to 02/11/2023 for all unmet goals on 11/19/2022. Target date updated to 04/14/2023 for all unmet goals on 01/20/2023. Target date updated to 10/21/2023 or all unmet goals on 07/29/2023  Patient will demonstrate improved FOTO by equal or greater than 10 by visit #17 to demonstrate improvement in overall condition and self-reported functional ability.  Baseline: initial baseline 55 (07/01/2022); 60 at visit #17 (12/26/2022); 66 at visit #30 Goal status: MET (Discontinued 07/29/2023 due to clinic no longer having access to this measure).   2.  Pt will decrease worst pain as reported on NPRS by at least 3 points in order to demonstrate clinically significant reduction in pain. Baseline: 4/10 (07/01/2022): up to 10/10  (11/19/2022); up to 4-5/10 when burning two days ago, 2.5/10 when excluding the burning  (12/06/2022); 3-4/10 in the last 2 weeks (01/20/2023); Pain is 1.5/10 over the past 2 weeks  Goal status: on-going  3.  Pt will demonstrate plank time of or more to demonstrate age-predicted match of core strength needed for job duties Baseline: 10sec (07/01/2022): 24 seconds (11/19/2022); 23 seconds (12/26/2022); 37 seconds (01/20/2023); 35 seconds (04/01/2023); 29 seconds (07/29/2023);  Goal status: In-progress  4.  Pt will demonstrate bering sorenson test of 34sec or more to demonstrate clinically significant increase in spinal extensor strength needed for job duties Baseline: 20sec (07/01/2022); 58 seconds (11/19/2022); deferred(04/01/23); 54 seconds (07/29/2023);  Goal status: MET  5.  Patient will be independent with long term HEP program for self-management of symptoms. Baseline: HEP to be reviewed and updated as needed at visit 7 as appropriate (11/19/22); she has has been participating 2x a week plus PT sessions + what  is left over after PT session (12/26/2022); has not been doing since cholecystectomy on 06/30/2023 (07/29/2023);  Goal status: MET, no regressed (07/29/2023).   6.  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  Baseline: 40# (07/29/23); Goal status: NEW 07/29/2023  7.  Patient will demonstrate improvement in Patient Specific Functional Scale (PSFS) of equal or greater than 8/10 points to reflect clinically significant improvement in patient's most valued functional activities.. Baseline: 4/10 (07/29/23); Goal status: NEW 07/29/2023  PLAN:  PT FREQUENCY: 1-2x/week  PT DURATION: 6 weeks  PLANNED INTERVENTIONS: Therapeutic exercises, Therapeutic activity, Neuromuscular re-education, Balance training, Gait training, Patient/Family education, Self Care, Joint mobilization, Stair training, Dry Needling, Electrical stimulation, Spinal mobilization, Cryotherapy, Moist heat, Manual therapy, and  Re-evaluation.  PLAN FOR NEXT SESSION:  Update HEP as appropriate, progressive core/LE/functional strengthening/motor control  exercises.    Marc Senior. Fairly IV, PT, DPT Physical Therapist- [place]  [facility]  7:57 PM, 09/16/23

## 2023-09-23 ENCOUNTER — Other Ambulatory Visit: Payer: Self-pay

## 2023-09-23 MED ORDER — AMPHETAMINE-DEXTROAMPHET ER 20 MG PO CP24
20.0000 mg | ORAL_CAPSULE | Freq: Every day | ORAL | 0 refills | Status: DC
  Filled 2023-09-23: qty 30, 30d supply, fill #0

## 2023-09-23 MED ORDER — METOPROLOL SUCCINATE ER 25 MG PO TB24
25.0000 mg | ORAL_TABLET | Freq: Every day | ORAL | 3 refills | Status: AC
  Filled 2023-09-23: qty 90, 90d supply, fill #0
  Filled 2023-10-21 – 2024-02-09 (×2): qty 90, 90d supply, fill #1
  Filled 2024-06-03: qty 90, 90d supply, fill #2

## 2023-09-24 ENCOUNTER — Encounter: Payer: Commercial Managed Care - PPO | Admitting: Physical Therapy

## 2023-09-30 ENCOUNTER — Ambulatory Visit: Payer: Commercial Managed Care - PPO | Attending: Physical Medicine & Rehabilitation | Admitting: Physical Therapy

## 2023-09-30 DIAGNOSIS — M792 Neuralgia and neuritis, unspecified: Secondary | ICD-10-CM | POA: Insufficient documentation

## 2023-09-30 DIAGNOSIS — M5459 Other low back pain: Secondary | ICD-10-CM | POA: Diagnosis not present

## 2023-09-30 DIAGNOSIS — M62838 Other muscle spasm: Secondary | ICD-10-CM | POA: Diagnosis not present

## 2023-09-30 NOTE — Therapy (Signed)
 OUTPATIENT PHYSICAL THERAPY TREATMENT   Patient Name: Theresa Ortiz MRN: [id number] DOB:1990-07-20, 33 y.o., female Today's Date: 09/30/2023  END OF SESSION:    PT End of Session - 09/30/23 1616     Visit Number 46    Number of Visits 60    Date for PT Re-Evaluation 10/21/23    Authorization Type [place] [employer] PPO reporting period from 08/12/2023    Progress Note Due on Visit 50    PT Start Time 1608    PT Stop Time 1658    PT Time Calculation (min) 50 min    Activity Tolerance Patient tolerated treatment well;No increased pain    Behavior During Therapy [facility] for tasks assessed/performed                 Past Medical History:  Diagnosis Date   Acute meniscal tear of knee    left knee   Allergy    Anxiety    Asthma    Atrial fibrillation (HCC)    slight   COVID-19 04/2020   Depression    GERD (gastroesophageal reflux disease)    Ovarian cyst    PVC (premature ventricular contraction)    Scoliosis    Shingles    Tachycardia    Past Surgical History:  Procedure Laterality Date   CHOLECYSTECTOMY N/A 07/01/2023   Procedure: LAPAROSCOPIC CHOLECYSTECTOMY;  Surgeon: Enid Harry, MD;  Location: [facility] OR;  Service: General;  Laterality: N/A;  GENERAL & TAP BLOCK   KNEE ARTHROSCOPY WITH LATERAL MENISECTOMY Left 09/01/2012   Procedure: LEFT KNEE ARTHROSCOPY WITH LATERAL MENISCECTOMY ;  Surgeon: Florencia Hunter, MD;  Location: [facility];  Service: Orthopedics;  Laterality: Left;   MENISCUS REPAIR Right    RIGHT INDEX  FINGER TENDON REPAIR  NOV 2013   Patient Active Problem List   Diagnosis Date Noted   Elevated liver enzymes 07/21/2023   Administrative encounter 07/21/2023   Helicobacter pylori ab+ 07/21/2023   Leukocytosis 07/21/2023   Microcytosis 07/21/2023   BMI 40.0-44.9, adult (HCC) 07/21/2023   Overactive bladder 07/21/2023   Personal history of COVID-19 07/21/2023   Asthma 04/03/2023   Gastro-esophageal reflux disease without  esophagitis 01/22/2019   Palpitation 07/13/2018   Atypical chest pain 07/13/2018   Family history of heart disease 07/13/2018   Shortness of breath 07/13/2018   Sinus tachycardia 07/13/2018   Moderate recurrent major depression (HCC) 05/25/2018   Tobacco user 07/29/2014   Mild persistent asthma, uncomplicated 07/04/2014   Impaired fasting glucose 11/18/2012   Acute lateral meniscus tear of left knee 09/01/2012   Attention deficit hyperactivity disorder, predominantly inattentive type 05/03/2011    PCP: Tisovec, Kristina Pfeiffer, MD  REFERRING PROVIDER: Candi Chafe, MD  REFERRING DIAG: 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 Pt reports she has some pain at the mid calf that is the size of packing tape that feels like a cramp. She reports she has tension at L low back and glute. LBP/glute pain aggravated by lifting/moving patients, and bending forward. Has had L sided sciatic symptoms 1x/week when lifting something heavy down post LLE. She hs previously had pain with prolonged  walking, but that has subsided over the past year or so. Has had successful PT in the past with [facility], which she thinks is very helpful. Pt works full time as an Engineer, structural, enjoys traveling and walking her dog. Comorbid conditions as listed above. No falls in past 6 months. Pt denies N/V, B&B changes, unexplained weight fluctuation, saddle paresthesia, fever, night sweats, or unrelenting night pain at this time. Patient denies hx of cancer, stroke, seizures, diabetes, unexplained weight loss, unexplained changes in bowel or bladder problems, unexplained stumbling or dropping  things, osteoporosis, and spinal surgery.  SUBJECTIVE:                                                                                                                                                                                           SUBJECTIVE STATEMENT:   Patient states back pain has been okay but she still has some "spurts" in her left glute region. She had a feeling of seizing up when bent over a lot in the attic. Her R ankle is really irritated right now. It felt like it needed to pop so she moved it around a lot and that really irritated. She states it has been recovering a bit more quickly over the weekend. She worked already today day 2/5. She barely worked today because she had to do another project and she had to stand for a longer amount of time. She has not tried any over the counter arch support and she has not gotten a custom one yet. She has mainly been doing breathing exercises and lateral stepping with the band.   PAIN: NPRS: 0/10 at low back just feeling it sporadically in L leg, 6/10 right sinus tarsi region.    Precautions: none  PATIENT GOALS: Get the cramp out of my calf, to "eleviate whatever pissed it off this time out of the leg"   OBJECTIVE  1RM calculated from <10RM (last tested 09/30/2023) Knee extension at [facility], seat position 1 R: 58.7# L: 43.2# Hamstring curl at OMEGA, seat position 1 R: 46.7# L: 46.7#  TODAY'S TREATMENT  Therapeutic exercise: therapeutic exercises that incorporate ONE parameter at one or more areas of the body to centralize symptoms, develop strength and endurance, range of motion, and flexibility required for successful completion of functional activities.  NuStep using bilateral upper and lower extremities. For improved extremity mobility, muscular endurance, and activity tolerance; and to induce the analgesic effect of aerobic exercise, stimulate improved joint nutrition, and prepare body structures and systems for following  interventions. Also to reinforce understanding of appropriate exercise intensity to help meet physical activity guidelines for health.  Seat/handle  setting: 9/9 5 minutes Level: 8 Target SPM: > 100 Average SPM: 80 RPE: 6/10  Side stepping hip abduction with BlackTB:  4x10 each way  Seated knee extension on OMEGA machine, seat position 1 B LE: 1x15 at 20# R LE: 1x10 at 35# 1x2 at 55# (missing last ROM, max) 1x3 poor reps at 35# (unable to perform higher with worse form) 1x10 at 30# (very difficult) L LE: 1 failed rep at 55, 45, and 35 1x7 at 35# (max) 1x10 at 30# (very difficult) No knee joint pain, muscle burn in quads   Seated hamstring curl on OMEGA machine, seat position 1 B LE: 1x15 at 35# R LE: 1x10 at 35# Failed attempts at 45 and 40# 1x10 at 35# L LE: 1x10 at 35# Failed attempts at 40# 1x10 at 35# No knee joint pain, muscle burn in hamstrings  Education about using cam boot while at work or unavoidable stressors to provide relative rest.    Pt required multimodal cuing for proper technique and to facilitate improved neuromuscular control, strength, range of motion, and functional ability resulting in improved performance and form.   PATIENT EDUCATION:  Education details: Exercise purpose/form. Self management techniques. Education on diagnosis, prognosis, POC, anatomy and physiology of current condition. Education on HEP including handout. Person educated: Patient Education method: Medical illustrator Education comprehension: verbalized understanding and returned demonstration, needs further education.   HOME EXERCISE PROGRAM: Access Code: [email or web address] URL: [email or web address] Date: 08/05/2023 Prepared by: Alleen Isle  Exercises - Bird Dog  - 3 x weekly - 2 sets - 10 reps - 5 second hold - Dead Bug  - 3 x weekly - 3 sets - 5-10 reps - Standard Plank  - 3 x weekly - 5 sets - 25 seconds hold - Standing Anti-Rotation Press with Anchored  Resistance  - 3 x weekly - 3 sets - 10 reps - 5 seconds hold - Seated Diaphragmatic Breathing  - 3 x daily - 20 reps    ASSESSMENT:  CLINICAL IMPRESSION:   Patient with stable improvement in low back and L LE pain and some improvement in R ankle with it recovering faster while off work during weekends. However, R ankle still very irritated during work week. Recommended trial of CAM boot while at work if allowed to provide relative rest to allow ankle to not get worse every time she works. Today's session focused on hip and knee strengthening. Attempted to find calculated 1RM for each leg in hamstring curl and knee extension. R LE stronger in knee extension. Patient tolerated well with no increased pain. Patient would benefit from continued management of limiting condition by skilled physical therapist to address remaining impairments and functional limitations to work towards stated goals and return to PLOF or maximal functional independence.   OBJECTIVE IMPAIRMENTS: Abnormal gait, decreased activity tolerance, decreased balance, decreased endurance, decreased knowledge of condition, decreased mobility, difficulty walking, decreased ROM, decreased strength, increased fascial restrictions, impaired perceived functional ability, increased muscle spasms, impaired flexibility, improper body mechanics, postural dysfunction, obesity, and pain.   ACTIVITY LIMITATIONS: carrying, lifting, bending, sitting, standing, squatting, stairs, transfers, hygiene/grooming, locomotion level, and caring for others  PARTICIPATION LIMITATIONS: meal prep, cleaning, laundry, driving, shopping, community activity, occupation, yard work, and  working, walking, prolonged standing, bending, lifting, yardwork, housework, sleeping, traveling, prolonged sitting, going to [employer], anything that requires repetitive bending or lifting   PERSONAL FACTORS: Fitness, Past/current experiences, Time since onset of  injury/illness/exacerbation, and 1-2 comorbidities: lateral meniscus tear of left knee;  Palpitation; Atypical chest pain; Family history of heart disease; Shortness of breath; and Sinus tachycardia on their problem list. past medical history of Acute meniscal tear of knee, Allergy, Anxiety, Asthma, Atrial fibrillation (HCC), COVID-19 (04/2020), Depression, Ovarian cyst, PVC (premature ventricular contraction), Scoliosis, Shingles, and Tachycardia. past surgical history that includes RIGHT INDEX  FINGER TENDON REPAIR (NOV 2013); Knee arthroscopy with lateral menisectomy (Left, 09/01/2012); and Meniscus repair (Right) are also affecting patient's functional outcome.   REHAB POTENTIAL: Good  CLINICAL DECISION MAKING: Evolving/moderate complexity  EVALUATION COMPLEXITY: Moderate  GOALS: Goals reviewed with patient? Yes  SHORT TERM GOALS: Target date: 08/07/22. Target date updated to 12/04/2022 on 11/19/2022.   Pt will be independent with initial HEP in order to improve strength and balance in order to decrease fall risk and improve function at home and work. Baseline:HEP given; patient not participating (11/19/2022); participating regularly (12/02/2022);  Goal status: MET  LONG TERM GOALS: Target date: 08/26/22. Target date updated to 02/11/2023 for all unmet goals on 11/19/2022. Target date updated to 04/14/2023 for all unmet goals on 01/20/2023. Target date updated to 10/21/2023 or all unmet goals on 07/29/2023  Patient will demonstrate improved FOTO by equal or greater than 10 by visit #17 to demonstrate improvement in overall condition and self-reported functional ability.  Baseline: initial baseline 55 (07/01/2022); 60 at visit #17 (12/26/2022); 66 at visit #30 Goal status: MET (Discontinued 07/29/2023 due to clinic no longer having access to this measure).   2.  Pt will decrease worst pain as reported on NPRS by at least 3 points in order to demonstrate clinically significant reduction in pain. Baseline: 4/10  (07/01/2022): up to 10/10  (11/19/2022); up to 4-5/10 when burning two days ago, 2.5/10 when excluding the burning (12/06/2022); 3-4/10 in the last 2 weeks (01/20/2023); Pain is 1.5/10 over the past 2 weeks  Goal status: on-going  3.  Pt will demonstrate plank time of or more to demonstrate age-predicted match of core strength needed for job duties Baseline: 10sec (07/01/2022): 24 seconds (11/19/2022); 23 seconds (12/26/2022); 37 seconds (01/20/2023); 35 seconds (04/01/2023); 29 seconds (07/29/2023);  Goal status: In-progress  4.  Pt will demonstrate bering sorenson test of 34sec or more to demonstrate clinically significant increase in spinal extensor strength needed for job duties Baseline: 20sec (07/01/2022); 58 seconds (11/19/2022); deferred(04/01/23); 54 seconds (07/29/2023);  Goal status: MET  5.  Patient will be independent with long term HEP program for self-management of symptoms. Baseline: HEP to be reviewed and updated as needed at visit 7 as appropriate (11/19/22); she has has been participating 2x a week plus PT sessions + what  is left over after PT session (12/26/2022); has not been doing since cholecystectomy on 06/30/2023 (07/29/2023);  Goal status: MET, no regressed (07/29/2023).   6.  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  Baseline: 40# (07/29/23); Goal status: NEW 07/29/2023  7.  Patient will demonstrate improvement in Patient Specific Functional Scale (PSFS) of equal or greater than 8/10 points to reflect clinically significant improvement in patient's most valued functional activities.. Baseline: 4/10 (07/29/23); Goal status: NEW 07/29/2023  PLAN:  PT FREQUENCY: 1-2x/week  PT DURATION: 6 weeks  PLANNED INTERVENTIONS: Therapeutic exercises, Therapeutic activity, Neuromuscular re-education, Balance training, Gait training, Patient/Family education, Self Care, Joint mobilization, Stair  training, Dry Needling, Electrical stimulation, Spinal mobilization, Cryotherapy, Moist heat, Manual therapy, and Re-evaluation.  PLAN FOR NEXT SESSION:  Update HEP  as appropriate, progressive core/LE/functional strengthening/motor control  exercises.    Carilyn Charles. Artemio Larry, PT, DPT 09/30/23, 5:05 PM  [facility] [address] [place], [place] [address] P: [phone] I F: [phone]

## 2023-10-07 ENCOUNTER — Encounter: Payer: Self-pay | Admitting: Physical Therapy

## 2023-10-07 ENCOUNTER — Ambulatory Visit: Payer: Commercial Managed Care - PPO | Admitting: Physical Therapy

## 2023-10-07 DIAGNOSIS — M792 Neuralgia and neuritis, unspecified: Secondary | ICD-10-CM

## 2023-10-07 DIAGNOSIS — M62838 Other muscle spasm: Secondary | ICD-10-CM

## 2023-10-07 DIAGNOSIS — M5459 Other low back pain: Secondary | ICD-10-CM | POA: Diagnosis not present

## 2023-10-07 NOTE — Therapy (Signed)
 OUTPATIENT PHYSICAL THERAPY TREATMENT   Patient Name: Theresa Ortiz MRN: [id number] DOB:10/04/1990, 33 y.o., female Today's Date: 10/07/2023  END OF SESSION:    PT End of Session - 10/07/23 1743     Visit Number 47    Number of Visits 60    Date for PT Re-Evaluation 10/21/23    Authorization Type [place] [employer] PPO reporting period from 08/12/2023    Progress Note Due on Visit 50    PT Start Time 1735    PT Stop Time 1815    PT Time Calculation (min) 40 min    Activity Tolerance Patient tolerated treatment well;No increased pain    Behavior During Therapy [facility] for tasks assessed/performed                  Past Medical History:  Diagnosis Date   Acute meniscal tear of knee    left knee   Allergy    Anxiety    Asthma    Atrial fibrillation (HCC)    slight   COVID-19 04/2020   Depression    GERD (gastroesophageal reflux disease)    Ovarian cyst    PVC (premature ventricular contraction)    Scoliosis    Shingles    Tachycardia    Past Surgical History:  Procedure Laterality Date   CHOLECYSTECTOMY N/A 07/01/2023   Procedure: LAPAROSCOPIC CHOLECYSTECTOMY;  Surgeon: Enid Harry, MD;  Location: [facility] OR;  Service: General;  Laterality: N/A;  GENERAL & TAP BLOCK   KNEE ARTHROSCOPY WITH LATERAL MENISECTOMY Left 09/01/2012   Procedure: LEFT KNEE ARTHROSCOPY WITH LATERAL MENISCECTOMY ;  Surgeon: Florencia Hunter, MD;  Location: [facility];  Service: Orthopedics;  Laterality: Left;   MENISCUS REPAIR Right    RIGHT INDEX  FINGER TENDON REPAIR  NOV 2013   Patient Active Problem List   Diagnosis Date Noted   Elevated liver enzymes 07/21/2023   Administrative encounter 07/21/2023   Helicobacter pylori ab+ 07/21/2023   Leukocytosis 07/21/2023   Microcytosis 07/21/2023   BMI 40.0-44.9, adult (HCC) 07/21/2023   Overactive bladder 07/21/2023   Personal history of COVID-19 07/21/2023   Asthma 04/03/2023   Gastro-esophageal reflux disease without  esophagitis 01/22/2019   Palpitation 07/13/2018   Atypical chest pain 07/13/2018   Family history of heart disease 07/13/2018   Shortness of breath 07/13/2018   Sinus tachycardia 07/13/2018   Moderate recurrent major depression (HCC) 05/25/2018   Tobacco user 07/29/2014   Mild persistent asthma, uncomplicated 07/04/2014   Impaired fasting glucose 11/18/2012   Acute lateral meniscus tear of left knee 09/01/2012   Attention deficit hyperactivity disorder, predominantly inattentive type 05/03/2011    PCP: Tisovec, Kristina Pfeiffer, MD  REFERRING PROVIDER: Candi Chafe, MD  REFERRING DIAG: 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 Pt reports she has some pain at the mid calf that is the size of packing tape that feels like a cramp. She reports she has tension at L low back and glute. LBP/glute pain aggravated by lifting/moving patients, and bending forward. Has had L sided sciatic symptoms 1x/week when lifting something heavy down post LLE. She hs previously had pain with  prolonged walking, but that has subsided over the past year or so. Has had successful PT in the past with [facility], which she thinks is very helpful. Pt works full time as an Engineer, structural, enjoys traveling and walking her dog. Comorbid conditions as listed above. No falls in past 6 months. Pt denies N/V, B&B changes, unexplained weight fluctuation, saddle paresthesia, fever, night sweats, or unrelenting night pain at this time. Patient denies hx of cancer, stroke, seizures, diabetes, unexplained weight loss, unexplained changes in bowel or bladder problems, unexplained stumbling or dropping  things, osteoporosis, and spinal surgery.  SUBJECTIVE:                                                                                                                                                                                           SUBJECTIVE STATEMENT:   Patient states she is good. She states her ankle recovery time over the weekend is still the same. She did a lot of walking on Friday when she went to the [employer] in [place]. She expected to hardly be able to walk on Saturday but it was not as much as she thought. Her back pain continues to stay in the posterior left thigh and cramping in the left calf when she was standing on her tiptoes. She has not been doing anything that hard at work currently. She tried going down the stairs once and had instant pain just distal to the lateral malleolus. HEP has been going okay but she still struggles with the ball one. The others she can do while she is watching TV but it takes all of her focus for the eversion exercise on her ankle. She continues to see an improvement in walking into work. She is allowed to wear a cam boot at work so she is going to get one. She was not that sore after last PT session.   PAIN: NPRS: 0.5/10 at low back and L posterior thigh, 2/10 lateral R ankle   Precautions: none  PATIENT GOALS: Get the cramp out of my calf, to "eleviate whatever pissed it off this time out of the leg"   OBJECTIVE  1RM calculated from <10RM (last tested 09/30/2023) Knee extension at [facility], seat position 1 R: 58.7# L: 43.2# Hamstring curl at OMEGA, seat position 1 R: 46.7# L: 46.7#  TODAY'S TREATMENT  Therapeutic exercise: therapeutic exercises that incorporate ONE parameter at one or more areas of the body to centralize symptoms, develop strength and endurance, range of motion, and flexibility required for successful completion of functional activities.  NuStep using bilateral upper and lower extremities. For improved  extremity mobility, muscular  endurance, and activity tolerance; and to induce the analgesic effect of aerobic exercise, stimulate improved joint nutrition, and prepare body structures and systems for following interventions. Also to reinforce understanding of appropriate exercise intensity to help meet physical activity guidelines for health.  Seat/handle setting: 9/9 5 minutes Level: 8 Target SPM:  Average SPM: 88 RPE: 6/10  Superset:  Seated knee extension on OMEGA machine, seat position 1 B LE: 1x15 at 35# R LE: 3x10 at 40# (~70%1RM) 1x2 at 55# (missing last ROM, max) L LE: 3x10 at 30# (~ 70%1RM) 1x7 at 35# (max) No knee joint pain, muscle burn in quads   Seated hamstring curl on OMEGA machine, seat position 1 B LE: 1x15 at 35# R LE: 3x10 at 35# (~75%1RM) 1x10 at 35# L LE: 3x10 at 35# (~75%1RM) No knee joint pain, muscle burn in hamstrings  Therapeutic activities: dynamic therapeutic activities incorporating MULTIPLE parameters or areas of the body designed to achieve improved functional performance.  Pallof press hold side step 2x10 each side  More difficulty with anchor to right (suggest starting with this side first next time).   Side stepping hip abduction with BlackTB:  2x10 steps each way  Pt required multimodal cuing for proper technique and to facilitate improved neuromuscular control, strength, range of motion, and functional ability resulting in improved performance and form.   PATIENT EDUCATION:  Education details: Exercise purpose/form. Self management techniques. Education on diagnosis, prognosis, POC, anatomy and physiology of current condition. Education on HEP including handout. Person educated: Patient Education method: Medical illustrator Education comprehension: verbalized understanding and returned demonstration, needs further education.   HOME EXERCISE PROGRAM: Access Code: [email or web address] URL: [email or web address] Date:  08/05/2023 Prepared by: Alleen [place]  Exercises - Bird Dog  - 3 x weekly - 2 sets - 10 reps - 5 second hold - Dead Bug  - 3 x weekly - 3 sets - 5-10 reps - Standard Plank  - 3 x weekly - 5 sets - 25 seconds hold - Standing Anti-Rotation Press with Anchored Resistance  - 3 x weekly - 3 sets - 10 reps - 5 seconds hold - Seated Diaphragmatic Breathing  - 3 x daily - 20 reps    ASSESSMENT:  CLINICAL IMPRESSION:   Continued working on LE, core, and functional strength. By end of session R ankle hurts a little bit more (less than 2 points on the NPRS), left glute/thigh feels looser. Patient continues to benefit from physical therapy but is still limited by R ankle and left low back/leg pain. Patient would benefit from continued management of limiting condition by skilled physical therapist to address remaining impairments and functional limitations to work towards stated goals and return to PLOF or maximal functional independence.    OBJECTIVE IMPAIRMENTS: Abnormal gait, decreased activity tolerance, decreased balance, decreased endurance, decreased knowledge of condition, decreased mobility, difficulty walking, decreased ROM, decreased strength, increased fascial restrictions, impaired perceived functional ability, increased muscle spasms, impaired flexibility, improper body mechanics, postural dysfunction, obesity, and pain.   ACTIVITY LIMITATIONS: carrying, lifting, bending, sitting, standing, squatting, stairs, transfers, hygiene/grooming, locomotion level, and caring for others  PARTICIPATION LIMITATIONS: meal prep, cleaning, laundry, driving, shopping, community activity, occupation, yard work, and  working, walking, prolonged standing, bending, lifting, yardwork, housework, sleeping, traveling, prolonged sitting, going to [employer], anything that requires repetitive bending or lifting   PERSONAL FACTORS: Fitness, Past/current experiences, Time since onset of injury/illness/exacerbation, and  1-2 comorbidities: lateral meniscus tear of left knee; Palpitation; Atypical chest pain; Family history of  heart disease; Shortness of breath; and Sinus tachycardia on their problem list. past medical history of Acute meniscal tear of knee, Allergy, Anxiety, Asthma, Atrial fibrillation (HCC), COVID-19 (04/2020), Depression, Ovarian cyst, PVC (premature ventricular contraction), Scoliosis, Shingles, and Tachycardia. past surgical history that includes RIGHT INDEX  FINGER TENDON REPAIR (NOV 2013); Knee arthroscopy with lateral menisectomy (Left, 09/01/2012); and Meniscus repair (Right) are also affecting patient's functional outcome.   REHAB POTENTIAL: Good  CLINICAL DECISION MAKING: Evolving/moderate complexity  EVALUATION COMPLEXITY: Moderate  GOALS: Goals reviewed with patient? Yes  SHORT TERM GOALS: Target date: 08/07/22. Target date updated to 12/04/2022 on 11/19/2022.   Pt will be independent with initial HEP in order to improve strength and balance in order to decrease fall risk and improve function at home and work. Baseline:HEP given; patient not participating (11/19/2022); participating regularly (12/02/2022);  Goal status: MET  LONG TERM GOALS: Target date: 08/26/22. Target date updated to 02/11/2023 for all unmet goals on 11/19/2022. Target date updated to 04/14/2023 for all unmet goals on 01/20/2023. Target date updated to 10/21/2023 or all unmet goals on 07/29/2023  Patient will demonstrate improved FOTO by equal or greater than 10 by visit #17 to demonstrate improvement in overall condition and self-reported functional ability.  Baseline: initial baseline 55 (07/01/2022); 60 at visit #17 (12/26/2022); 66 at visit #30 Goal status: MET (Discontinued 07/29/2023 due to clinic no longer having access to this measure).   2.  Pt will decrease worst pain as reported on NPRS by at least 3 points in order to demonstrate clinically significant reduction in pain. Baseline: 4/10 (07/01/2022): up to 10/10   (11/19/2022); up to 4-5/10 when burning two days ago, 2.5/10 when excluding the burning (12/06/2022); 3-4/10 in the last 2 weeks (01/20/2023); Pain is 1.5/10 over the past 2 weeks  Goal status: on-going  3.  Pt will demonstrate plank time of or more to demonstrate age-predicted match of core strength needed for job duties Baseline: 10sec (07/01/2022): 24 seconds (11/19/2022); 23 seconds (12/26/2022); 37 seconds (01/20/2023); 35 seconds (04/01/2023); 29 seconds (07/29/2023);  Goal status: In-progress  4.  Pt will demonstrate bering sorenson test of 34sec or more to demonstrate clinically significant increase in spinal extensor strength needed for job duties Baseline: 20sec (07/01/2022); 58 seconds (11/19/2022); deferred(04/01/23); 54 seconds (07/29/2023);  Goal status: MET  5.  Patient will be independent with long term HEP program for self-management of symptoms. Baseline: HEP to be reviewed and updated as needed at visit 7 as appropriate (11/19/22); she has has been participating 2x a week plus PT sessions + what  is left over after PT session (12/26/2022); has not been doing since cholecystectomy on 06/30/2023 (07/29/2023);  Goal status: MET, no regressed (07/29/2023).   6.  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  Baseline: 40# (07/29/23); Goal status: NEW 07/29/2023  7.  Patient will demonstrate improvement in Patient Specific Functional Scale (PSFS) of equal or greater than 8/10 points to reflect clinically significant improvement in patient's most valued functional activities.. Baseline: 4/10 (07/29/23); Goal status: NEW 07/29/2023  PLAN:  PT FREQUENCY: 1-2x/week  PT DURATION: 6 weeks  PLANNED INTERVENTIONS: Therapeutic exercises, Therapeutic activity, Neuromuscular re-education, Balance training, Gait training, Patient/Family education, Self Care, Joint mobilization, Stair training, Dry Needling,  Electrical stimulation, Spinal mobilization, Cryotherapy, Moist heat, Manual therapy, and Re-evaluation.  PLAN FOR NEXT SESSION:  Update HEP as appropriate, progressive core/LE/functional strengthening/motor control  exercises.    Carilyn Charles. Artemio Larry, PT, DPT 10/07/23, 7:45 PM  [facility] [address] [place], [place] [address] P: [phone] I F: [phone]

## 2023-10-10 ENCOUNTER — Other Ambulatory Visit

## 2023-10-14 ENCOUNTER — Encounter: Payer: Self-pay | Admitting: Physical Therapy

## 2023-10-14 ENCOUNTER — Ambulatory Visit: Payer: Commercial Managed Care - PPO | Admitting: Physical Therapy

## 2023-10-14 DIAGNOSIS — M5459 Other low back pain: Secondary | ICD-10-CM | POA: Diagnosis not present

## 2023-10-14 DIAGNOSIS — M792 Neuralgia and neuritis, unspecified: Secondary | ICD-10-CM | POA: Diagnosis not present

## 2023-10-14 DIAGNOSIS — M62838 Other muscle spasm: Secondary | ICD-10-CM

## 2023-10-14 NOTE — Therapy (Signed)
 OUTPATIENT PHYSICAL THERAPY TREATMENT   Patient Name: Theresa Ortiz MRN: [id number] DOB:January 18, 1991, 33 y.o., female Today's Date: 10/14/2023  END OF SESSION:    PT End of Session - 10/14/23 1935     Visit Number 48    Number of Visits 60    Date for PT Re-Evaluation 10/21/23    Authorization Type [place] [employer] P[employer] reporting period from 08/12/2023    Progress Note Due on Visit 50    PT Start Time 1604    PT Stop Time 1644    PT Time Calculation (min) 40 min    Activity Tolerance Patient tolerated treatment well    Behavior During Therapy [facility] for tasks assessed/performed                Past Medical History:  Diagnosis Date   Acute meniscal tear of knee    left knee   Allergy    Anxiety    Asthma    Atrial fibrillation (HCC)    slight   COVID-19 04/2020   Depression    GERD (gastroesophageal reflux disease)    Ovarian cyst    PVC (premature ventricular contraction)    Scoliosis    Shingles    Tachycardia    Past Surgical History:  Procedure Laterality Date   CHOLECYSTECTOMY N/A 07/01/2023   Procedure: LAPAROSCOPIC CHOLECYSTECTOMY;  Surgeon: Enid Harry, MD;  Location: [facility] OR;  Service: General;  Laterality: N/A;  GENERAL & TAP BLOCK   KNEE ARTHROSCOPY WITH LATERAL MENISECTOMY Left 09/01/2012   Procedure: LEFT KNEE ARTHROSCOPY WITH LATERAL MENISCECTOMY ;  Surgeon: Florencia Hunter, MD;  Location: [facility];  Service: Orthopedics;  Laterality: Left;   MENISCUS REPAIR Right    RIGHT INDEX  FINGER TENDON REPAIR  NOV 2013   Patient Active Problem List   Diagnosis Date Noted   Elevated liver enzymes 07/21/2023   Administrative encounter 07/21/2023   Helicobacter pylori ab+ 07/21/2023   Leukocytosis 07/21/2023   Microcytosis 07/21/2023   BMI 40.0-44.9, adult (HCC) 07/21/2023   Overactive bladder 07/21/2023   Personal history of COVID-19 07/21/2023   Asthma 04/03/2023   Gastro-esophageal reflux disease without esophagitis 01/22/2019    Palpitation 07/13/2018   Atypical chest pain 07/13/2018   Family history of heart disease 07/13/2018   Shortness of breath 07/13/2018   Sinus tachycardia 07/13/2018   Moderate recurrent major depression (HCC) 05/25/2018   Tobacco user 07/29/2014   Mild persistent asthma, uncomplicated 07/04/2014   Impaired fasting glucose 11/18/2012   Acute lateral meniscus tear of left knee 09/01/2012   Attention deficit hyperactivity disorder, predominantly inattentive type 05/03/2011    PCP: Tisovec, Kristina Pfeiffer, MD  REFERRING PROVIDER: Candi Chafe, MD  REFERRING DIAG: 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 Pt reports she has some pain at the mid calf that is the size of packing tape that feels like a cramp. She reports she has tension at L low back and glute. LBP/glute pain aggravated by lifting/moving patients, and bending forward. Has had L sided sciatic symptoms 1x/week when lifting something heavy down post LLE. She hs previously had pain with prolonged walking, but that  has subsided over the past year or so. Has had successful PT in the past with [facility], which she thinks is very helpful. Pt works full time as an Engineer, structural, enjoys traveling and walking her dog. Comorbid conditions as listed above. No falls in past 6 months. Pt denies N/V, B&B changes, unexplained weight fluctuation, saddle paresthesia, fever, night sweats, or unrelenting night pain at this time. Patient denies hx of cancer, stroke, seizures, diabetes, unexplained weight loss, unexplained changes in bowel or bladder problems, unexplained stumbling or dropping things, osteoporosis, and  spinal surgery.  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 She noticed increased L LE symptoms to days later. She first noticed it in her lower leg. Yesterday in th OR she felt it in her foot and to the side. She also is getting the weird feeling in her left glute when she takes a step The spot where it popped was sore but it is fine now. She stopped bending over after the pop. She still has not done a R ankle brace since last week. She states the pain by her lateral malleolus is better but she has constant pain along the lateral superior foot. The back/leg pain has been moving out of her back into her leg. The recurrence of her back pain was really disappointing because she felt her back was finally feeling like a normal person.   PAIN: NPRS: 3/10 at low back and L LE, 1/10 lateral R ankle/foot  Precautions: none  PATIENT GOALS: Get the cramp out of my calf, to eleviate whatever pissed it off this time out of the leg   OBJECTIVE  1RM calculated from <10RM (last tested 09/30/2023) Knee extension at [facility], seat position 1 R: 58.7# L: 43.2# Hamstring curl at [place], seat position 1 R: 46.7# L: 46.7#  TODAY'S TREATMENT  Therapeutic exercise: therapeutic exercises that incorporate ONE parameter at one or more areas of the body to centralize symptoms, develop strength and endurance, range of motion, and flexibility required for successful completion of functional activities.  Standing lateral side-bending (asterisk sign)  Prone press up 1x10 1x10 with hands elevated with lock and sag Sharpness felt at left sacral area with full extension Gets weird sensation in foot  when she does this  1x10 with towel across upper lumbar spine Pin prick gone, fullness/heaviness increased through entire lower leg  Standing L side glide against wall (R side on wall) 1x10  Foot better 1x10 with block Decreased leg heaviness, temporary tingling on bottom of foot, and fatigue at low back following.  Added to HEP   Manual therapy: to reduce pain and tissue tension, improve range of motion, neuromodulation, in order to promote improved ability to complete functional activities. PRONE  CPA grade III along lower thoracic and lumbar spine STM to lumbar and lower thoracic paraspinals, L > R, left posterior hip/glute region   Pt required multimodal cuing for proper technique and to facilitate improved neuromuscular control, strength, range of motion, and functional ability resulting in improved performance and form.   PATIENT EDUCATION:  Education details: Exercise purpose/form. Self management techniques. Education on diagnosis, prognosis, POC, anatomy and physiology of current condition. Education on HEP including handout. Person educated: Patient Education method: Medical illustrator Education comprehension: verbalized understanding and returned demonstration, needs further education.   HOME EXERCISE PROGRAM: Access Code: [email or web address] URL: [email or web address] Date: 10/14/2023 Prepared by: Alleen Isle  Exercises - Bird Dog  - 3 x weekly - 2 sets - 10 reps - 5 second hold - Dead Bug  - 3 x weekly - 3 sets - 5-10 reps - Standard Plank  - 3 x weekly - 5 sets - 25 seconds hold - Standing Anti-Rotation Press with Anchored Resistance  - 3 x weekly - 3 sets - 10 reps - 5 seconds hold - Seated Diaphragmatic Breathing  - 3 x daily - 20 reps - Right Standing Lateral Shift Correction at Wall - Repetitions  - 15 reps - 1 second hold - every 2-3 hours frequency:     ASSESSMENT:  CLINICAL IMPRESSION:   Patient with recurrence of low back pain with  radicular symptoms on L LE 2-3 days after feeling a pop in the upper lumbar spine on the right last week during work. Patient width mildly positive response to repeated L side-glide, CPA joint mobilizations along the lower thoracic and lumbar spine, and soft tissue mobilization to the left lumbar paraspinals and L glute region. Patient's symptoms are similar to her previous exacerbations of low back pain. It is unexpected that left LE symptoms would be reproduced due to the right sided pop she felt last week since it was near the upper lumbar region, but could still affect the her back symptoms. Encouraged her to continue her strengthening exercises as tolerated and utilize the side glide for symptom control/prophylaxis. She felt better in some regions and no worse in others by end of session. Patient would benefit from continued management of limiting condition by skilled physical therapist to address remaining impairments and functional limitations to work towards stated goals and return to PLOF or maximal functional independence.    OBJECTIVE IMPAIRMENTS: Abnormal gait, decreased activity tolerance, decreased balance, decreased endurance, decreased knowledge of condition, decreased mobility, difficulty walking, decreased ROM, decreased strength, increased fascial restrictions, impaired perceived functional ability, increased muscle spasms, impaired flexibility, improper body mechanics, postural dysfunction, obesity, and pain.   ACTIVITY LIMITATIONS: carrying, lifting, bending, sitting, standing, squatting, stairs, transfers, hygiene/grooming, locomotion level, and caring for others  PARTICIPATION LIMITATIONS: meal prep, cleaning, laundry, driving, shopping, community activity, occupation, yard work, and  working, walking, prolonged standing, bending, lifting, yardwork, housework, sleeping, traveling, prolonged sitting, going to [employer], anything that requires repetitive bending or lifting   PERSONAL  FACTORS: Fitness, Past/current experiences, Time since  onset of injury/illness/exacerbation, and 1-2 comorbidities: lateral meniscus tear of left knee; Palpitation; Atypical chest pain; Family history of heart disease; Shortness of breath; and Sinus tachycardia on their problem list. past medical history of Acute meniscal tear of knee, Allergy, Anxiety, Asthma, Atrial fibrillation (HCC), COVID-19 (04/2020), Depression, Ovarian cyst, PVC (premature ventricular contraction), Scoliosis, Shingles, and Tachycardia. past surgical history that includes RIGHT INDEX  FINGER TENDON REPAIR (NOV 2013); Knee arthroscopy with lateral menisectomy (Left, 09/01/2012); and Meniscus repair (Right) are also affecting patient's functional outcome.   REHAB POTENTIAL: Good  CLINICAL DECISION MAKING: Evolving/moderate complexity  EVALUATION COMPLEXITY: Moderate  GOALS: Goals reviewed with patient? Yes  SHORT TERM GOALS: Target date: 08/07/22. Target date updated to 12/04/2022 on 11/19/2022.   Pt will be independent with initial HEP in order to improve strength and balance in order to decrease fall risk and improve function at home and work. Baseline:HEP given; patient not participating (11/19/2022); participating regularly (12/02/2022);  Goal status: MET  LONG TERM GOALS: Target date: 08/26/22. Target date updated to 02/11/2023 for all unmet goals on 11/19/2022. Target date updated to 04/14/2023 for all unmet goals on 01/20/2023. Target date updated to 10/21/2023 or all unmet goals on 07/29/2023  Patient will demonstrate improved FOTO by equal or greater than 10 by visit #17 to demonstrate improvement in overall condition and self-reported functional ability.  Baseline: initial baseline 55 (07/01/2022); 60 at visit #17 (12/26/2022); 66 at visit #30 Goal status: MET (Discontinued 07/29/2023 due to clinic no longer having access to this measure).   2.  Pt will decrease worst pain as reported on NPRS by at least 3 points in order to  demonstrate clinically significant reduction in pain. Baseline: 4/10 (07/01/2022): up to 10/10  (11/19/2022); up to 4-5/10 when burning two days ago, 2.5/10 when excluding the burning (12/06/2022); 3-4/10 in the last 2 weeks (01/20/2023); Pain is 1.5/10 over the past 2 weeks  Goal status: on-going  3.  Pt will demonstrate plank time of or more to demonstrate age-predicted match of core strength needed for job duties Baseline: 10sec (07/01/2022): 24 seconds (11/19/2022); 23 seconds (12/26/2022); 37 seconds (01/20/2023); 35 seconds (04/01/2023); 29 seconds (07/29/2023);  Goal status: In-progress  4.  Pt will demonstrate bering sorenson test of 34sec or more to demonstrate clinically significant increase in spinal extensor strength needed for job duties Baseline: 20sec (07/01/2022); 58 seconds (11/19/2022); deferred(04/01/23); 54 seconds (07/29/2023);  Goal status: MET  5.  Patient will be independent with long term HEP program for self-management of symptoms. Baseline: HEP to be reviewed and updated as needed at visit 7 as appropriate (11/19/22); she has has been participating 2x a week plus PT sessions + what  is left over after PT session (12/26/2022); has not been doing since cholecystectomy on 06/30/2023 (07/29/2023);  Goal status: MET, no regressed (07/29/2023).   6.  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  Baseline: 40# (07/29/23); Goal status: NEW 07/29/2023  7.  Patient will demonstrate improvement in Patient Specific Functional Scale (PSFS) of equal or greater than 8/10 points to reflect clinically significant improvement in patient's most valued functional activities.. Baseline: 4/10 (07/29/23); Goal status: NEW 07/29/2023  PLAN:  PT FREQUENCY: 1-2x/week  PT DURATION: 6 weeks  PLANNED INTERVENTIONS: Therapeutic exercises, Therapeutic activity, Neuromuscular re-education, Balance training, Gait  training, Patient/Family education, Self Care, Joint mobilization, Stair training, Dry Needling, Electrical stimulation, Spinal mobilization, Cryotherapy, Moist heat,  Manual therapy, and Re-evaluation.  PLAN FOR NEXT SESSION:  Update HEP as appropriate, progressive core/LE/functional strengthening/motor control  exercises.    Carilyn Charles. Artemio Larry, PT, DPT 10/14/23, 7:41 PM  [facility] [address] [place], [place] [address] P: [phone] I F: [phone]

## 2023-10-17 ENCOUNTER — Ambulatory Visit (INDEPENDENT_AMBULATORY_CARE_PROVIDER_SITE_OTHER)

## 2023-10-17 DIAGNOSIS — M7751 Other enthesopathy of right foot: Secondary | ICD-10-CM | POA: Diagnosis not present

## 2023-10-17 DIAGNOSIS — M7752 Other enthesopathy of left foot: Secondary | ICD-10-CM

## 2023-10-17 DIAGNOSIS — M2141 Flat foot [pes planus] (acquired), right foot: Secondary | ICD-10-CM

## 2023-10-17 NOTE — Progress Notes (Signed)
 Patient presents today to pick up custom molded foot orthotics, diagnosed with Capsulitis by Dr. Lara Plants.   Orthotics were dispensed and fit was satisfactory. Reviewed instructions for break-in and wear. Written instructions given to patient.  Patient will follow up as needed.  Britton Cane Cped, CFo CFm

## 2023-10-17 NOTE — Progress Notes (Signed)
 Orthotics   Patient was present and evaluated for Custom molded foot orthotics. Patient will benefit from CFO's to provide total contact to BIL MLA's helping to balance and distribute body weight more evenly across BIL feet helping to reduce plantar pressure and pain. Orthotic will also encourage FF / RF alignment  Patient was scanned today and will return for fitting upon receipt

## 2023-10-21 ENCOUNTER — Ambulatory Visit: Payer: Commercial Managed Care - PPO | Admitting: Physical Therapy

## 2023-10-21 ENCOUNTER — Other Ambulatory Visit: Payer: Self-pay

## 2023-10-21 ENCOUNTER — Encounter: Payer: Self-pay | Admitting: Physical Therapy

## 2023-10-21 DIAGNOSIS — M792 Neuralgia and neuritis, unspecified: Secondary | ICD-10-CM | POA: Diagnosis not present

## 2023-10-21 DIAGNOSIS — M5459 Other low back pain: Secondary | ICD-10-CM | POA: Diagnosis not present

## 2023-10-21 DIAGNOSIS — M62838 Other muscle spasm: Secondary | ICD-10-CM | POA: Diagnosis not present

## 2023-10-21 MED ORDER — AMPHETAMINE-DEXTROAMPHET ER 20 MG PO CP24
20.0000 mg | ORAL_CAPSULE | Freq: Every day | ORAL | 0 refills | Status: AC
  Filled 2023-10-23 – 2023-10-24 (×2): qty 30, 30d supply, fill #0

## 2023-10-21 NOTE — Therapy (Signed)
 OUTPATIENT PHYSICAL THERAPY TREATMENT   Patient Name: Theresa Ortiz MRN: [id number] DOB:07/09/1990, 33 y.o., female Today's Date: 10/21/2023  END OF SESSION:    PT End of Session - 10/21/23 1741     Visit Number 49    Number of Visits 60    Date for PT Re-Evaluation 10/21/23    Authorization Type [employer] P[employer] reporting period from 08/12/2023    Progress Note Due on Visit 50    PT Start Time 1734    PT Stop Time 1825    PT Time Calculation (min) 51 min    Activity Tolerance Patient tolerated treatment well    Behavior During Therapy [facility] for tasks assessed/performed                 Past Medical History:  Diagnosis Date   Acute meniscal tear of knee    left knee   Allergy    Anxiety    Asthma    Atrial fibrillation (HCC)    slight   COVID-19 04/2020   Depression    GERD (gastroesophageal reflux disease)    Ovarian cyst    PVC (premature ventricular contraction)    Scoliosis    Shingles    Tachycardia    Past Surgical History:  Procedure Laterality Date   CHOLECYSTECTOMY N/A 07/01/2023   Procedure: LAPAROSCOPIC CHOLECYSTECTOMY;  Surgeon: Ebbie Cough, MD;  Location: [facility] OR;  Service: General;  Laterality: N/A;  GENERAL & TAP BLOCK   KNEE ARTHROSCOPY WITH LATERAL MENISECTOMY Left 09/01/2012   Procedure: LEFT KNEE ARTHROSCOPY WITH LATERAL MENISCECTOMY ;  Surgeon: Tanda DELENA Heading, MD;  Location: [facility];  Service: Orthopedics;  Laterality: Left;   MENISCUS REPAIR Right    RIGHT INDEX  FINGER TENDON REPAIR  NOV 2013   Patient Active Problem List   Diagnosis Date Noted   Elevated liver enzymes 07/21/2023   Administrative encounter 07/21/2023   Helicobacter pylori ab+ 07/21/2023   Leukocytosis 07/21/2023   Microcytosis 07/21/2023   BMI 40.0-44.9, adult (HCC) 07/21/2023   Overactive bladder 07/21/2023   Personal history of COVID-19 07/21/2023   Asthma 04/03/2023   Gastro-esophageal reflux disease without esophagitis  01/22/2019   Palpitation 07/13/2018   Atypical chest pain 07/13/2018   Family history of heart disease 07/13/2018   Shortness of breath 07/13/2018   Sinus tachycardia 07/13/2018   Moderate recurrent major depression (HCC) 05/25/2018   Tobacco user 07/29/2014   Mild persistent asthma, uncomplicated 07/04/2014   Impaired fasting glucose 11/18/2012   Acute lateral meniscus tear of left knee 09/01/2012   Attention deficit hyperactivity disorder, predominantly inattentive type 05/03/2011    PCP: Tisovec, Charlie ORN, MD  REFERRING PROVIDER: Delon Gins, MD  REFERRING DIAG: 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 Pt reports she has some pain at the mid calf that is the size of packing tape that feels like a cramp. She reports she has tension at L low back and glute. LBP/glute pain aggravated by lifting/moving patients, and bending forward. Has had L sided sciatic symptoms 1x/week when lifting something heavy down post LLE. She hs previously had pain with prolonged walking, but  that has subsided over the past year or so. Has had successful PT in the past with [facility], which she thinks is very helpful. Pt works full time as an Engineer, structural, enjoys traveling and walking her dog. Comorbid conditions as listed above. No falls in past 6 months. Pt denies N/V, B&B changes, unexplained weight fluctuation, saddle paresthesia, fever, night sweats, or unrelenting night pain at this time. Patient denies hx of cancer, stroke, seizures, diabetes, unexplained weight loss, unexplained changes in bowel or bladder problems, unexplained stumbling or dropping things,  osteoporosis, and spinal surgery.  SUBJECTIVE:                                                                                                                                                                                           SUBJECTIVE STATEMENT:   Patient states states she continues to have more left foot and leg tingling, cramping, sensation of foot being cold, symptoms when she bends and twists. Also some feeling of something in her right glute every once in a while. Patient's ankle got some overuse this weekend so it is not a good judgement of it. She got her orthotics and she is having to find shoes for them. HEP is going okay. She incorporated the back exercises in. Her back still feels really irritated to her.   PAIN: NPRS: 2/10 at low back and L LE, 2/10 lateral R ankle/foot  Precautions: none  PATIENT GOALS: Get the cramp out of my calf, to eleviate whatever pissed it off this time out of the leg   OBJECTIVE  1RM calculated from <10RM (last tested 09/30/2023) Knee extension at [facility], seat position 1 R: 58.7# L: 43.2# Hamstring curl at [place], seat position 1 R: 46.7# L: 46.7#  TODAY'S TREATMENT  Therapeutic exercise: therapeutic exercises that incorporate ONE parameter at one or more areas of the body to centralize symptoms, develop strength and endurance, range of motion, and flexibility required for successful completion of functional activities.  NuStep using bilateral upper and lower extremities. For improved extremity mobility, muscular endurance, and activity tolerance; and to induce the analgesic effect of aerobic exercise, stimulate improved joint nutrition, and prepare body structures and systems for following interventions. Also to reinforce understanding of appropriate exercise intensity to help meet physical activity guidelines for health.  Seat/handle setting: 9/9 5  minutes Level: 8 Target SPM: > 100 Average SPM: 96 RPE: 6/10  Prone press up 1x10  with hands elevated with lock and sag (Manual therapy - see below) 1x6 with hands elevated with lock and sag,  first few feel fine, then it starts to hurt in left glute 1x10  with hands elevated hips off center to right No problems on the left, pulling on the right glute L foot feels weird No change from arrival to appointment  Quadruped bird dog (alternating shoulder flexion/contralateral hip extension with core muscles braced) 1x10 each side with 10 second holds 1x5 each side with 10-20 second holds with eyes closed  Side plank knees to elbow 2x5 with 5-10 second holds each side  Curl up in neutral 2x5 with 10 second holds  Education on HEP including handout     Manual therapy: to reduce pain and tissue tension, improve range of motion, neuromodulation, in order to promote improved ability to complete functional activities. PRONE  CPA grade III along lower thoracic and lumbar spine various levels cause sensation down left LE   Pt required multimodal cuing for proper technique and to facilitate improved neuromuscular control, strength, range of motion, and functional ability resulting in improved performance and form.   PATIENT EDUCATION:  Education details: Exercise purpose/form. Self management techniques. Education on diagnosis, prognosis, POC, anatomy and physiology of current condition. Education on HEP including handout. Person educated: Patient Education method: Medical illustrator Education comprehension: verbalized understanding and returned demonstration, needs further education.   HOME EXERCISE PROGRAM: Access Code: [email or web address] URL: [email or web address] Date: 10/21/2023 Prepared by: Camie Cleverly  Exercises - Bird Dog  - 1 x daily - 2 sets - 5 reps - 10 second hold - Neutral Curl Up with Straight Leg  - 1 x daily - 2 sets - 5 reps - 10 seconds hold - Side Plank on Knees  - 1 x daily - 2 sets - 5 reps - 5-10 seconds  hold     ASSESSMENT:  CLINICAL IMPRESSION:   Patient with continued L LE symptoms, not really significantly better with repeated motions or CPAs. Returned to core exercises addressing all sides of spine in low-irritating positions. Patient tolerated well. HEP updated to include these exercises more frequently, Patient would benefit from continued management of limiting condition by skilled physical therapist to address remaining impairments and functional limitations to work towards stated goals and return to PLOF or maximal functional independence.     OBJECTIVE IMPAIRMENTS: Abnormal gait, decreased activity tolerance, decreased balance, decreased endurance, decreased knowledge of condition, decreased mobility, difficulty walking, decreased ROM, decreased strength, increased fascial restrictions, impaired perceived functional ability, increased muscle spasms, impaired flexibility, improper body mechanics, postural dysfunction, obesity, and pain.   ACTIVITY LIMITATIONS: carrying, lifting, bending, sitting, standing, squatting, stairs, transfers, hygiene/grooming, locomotion level, and caring for others  PARTICIPATION LIMITATIONS: meal prep, cleaning, laundry, driving, shopping, community activity, occupation, yard work, and  working, walking, prolonged standing, bending, lifting, yardwork, housework, sleeping, traveling, prolonged sitting, going to [employer], anything that requires repetitive bending or lifting   PERSONAL FACTORS: Fitness, Past/current experiences, Time since onset of injury/illness/exacerbation, and 1-2 comorbidities: lateral meniscus tear of left knee; Palpitation; Atypical chest pain; Family history of heart disease; Shortness of breath; and Sinus tachycardia on their problem list. past medical history of Acute meniscal tear of knee, Allergy, Anxiety, Asthma, Atrial fibrillation (HCC), COVID-19 (04/2020), Depression, Ovarian cyst, PVC (premature ventricular contraction),  Scoliosis, Shingles, and Tachycardia. past surgical history that includes RIGHT INDEX  FINGER TENDON REPAIR (NOV 2013); Knee arthroscopy with lateral menisectomy (Left, 09/01/2012); and Meniscus repair (Right) are also affecting patient's functional outcome.   REHAB POTENTIAL: Good  CLINICAL DECISION MAKING: Evolving/moderate complexity  EVALUATION COMPLEXITY: Moderate  GOALS: Goals reviewed with patient? Yes  SHORT TERM GOALS: Target date: 08/07/22. Target  date updated to 12/04/2022 on 11/19/2022.   Pt will be independent with initial HEP in order to improve strength and balance in order to decrease fall risk and improve function at home and work. Baseline:HEP given; patient not participating (11/19/2022); participating regularly (12/02/2022);  Goal status: MET  LONG TERM GOALS: Target date: 08/26/22. Target date updated to 02/11/2023 for all unmet goals on 11/19/2022. Target date updated to 04/14/2023 for all unmet goals on 01/20/2023. Target date updated to 10/21/2023 or all unmet goals on 07/29/2023  Patient will demonstrate improved FOTO by equal or greater than 10 by visit #17 to demonstrate improvement in overall condition and self-reported functional ability.  Baseline: initial baseline 55 (07/01/2022); 60 at visit #17 (12/26/2022); 66 at visit #30 Goal status: MET (Discontinued 07/29/2023 due to clinic no longer having access to this measure).   2.  Pt will decrease worst pain as reported on NPRS by at least 3 points in order to demonstrate clinically significant reduction in pain. Baseline: 4/10 (07/01/2022): up to 10/10  (11/19/2022); up to 4-5/10 when burning two days ago, 2.5/10 when excluding the burning (12/06/2022); 3-4/10 in the last 2 weeks (01/20/2023); Pain is 1.5/10 over the past 2 weeks  Goal status: on-going  3.  Pt will demonstrate plank time of or more to demonstrate age-predicted match of core strength needed for job duties Baseline: 10sec (07/01/2022): 24 seconds (11/19/2022); 23  seconds (12/26/2022); 37 seconds (01/20/2023); 35 seconds (04/01/2023); 29 seconds (07/29/2023);  Goal status: In-progress  4.  Pt will demonstrate bering sorenson test of 34sec or more to demonstrate clinically significant increase in spinal extensor strength needed for job duties Baseline: 20sec (07/01/2022); 58 seconds (11/19/2022); deferred(04/01/23); 54 seconds (07/29/2023);  Goal status: MET  5.  Patient will be independent with long term HEP program for self-management of symptoms. Baseline: HEP to be reviewed and updated as needed at visit 7 as appropriate (11/19/22); she has has been participating 2x a week plus PT sessions + what  is left over after PT session (12/26/2022); has not been doing since cholecystectomy on 06/30/2023 (07/29/2023);  Goal status: MET, no regressed (07/29/2023).   6.  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  Baseline: 40# (07/29/23); Goal status: NEW 07/29/2023  7.  Patient will demonstrate improvement in Patient Specific Functional Scale (PSFS) of equal or greater than 8/10 points to reflect clinically significant improvement in patient's most valued functional activities.. Baseline: 4/10 (07/29/23); Goal status: NEW 07/29/2023  PLAN:  PT FREQUENCY: 1-2x/week  PT DURATION: 6 weeks  PLANNED INTERVENTIONS: Therapeutic exercises, Therapeutic activity, Neuromuscular re-education, Balance training, Gait training, Patient/Family education, Self Care, Joint mobilization, Stair training, Dry Needling, Electrical stimulation, Spinal mobilization, Cryotherapy, Moist heat, Manual therapy, and Re-evaluation.  PLAN FOR NEXT SESSION:  Update HEP as appropriate, progressive core/LE/functional strengthening/motor control  exercises.    Camie SAUNDERS. Juli, PT, DPT 10/21/23, 7:44 PM  [facility] [address] [place], [place] [address] P: [phone] I  F: [phone]

## 2023-10-23 ENCOUNTER — Other Ambulatory Visit: Payer: Self-pay

## 2023-10-24 ENCOUNTER — Other Ambulatory Visit: Payer: Self-pay

## 2023-10-28 ENCOUNTER — Ambulatory Visit: Admitting: Physical Therapy

## 2023-11-04 ENCOUNTER — Ambulatory Visit: Attending: Physical Medicine & Rehabilitation | Admitting: Physical Therapy

## 2023-11-04 ENCOUNTER — Encounter: Payer: Self-pay | Admitting: Physical Therapy

## 2023-11-04 DIAGNOSIS — M792 Neuralgia and neuritis, unspecified: Secondary | ICD-10-CM | POA: Insufficient documentation

## 2023-11-04 DIAGNOSIS — M5459 Other low back pain: Secondary | ICD-10-CM | POA: Diagnosis not present

## 2023-11-04 DIAGNOSIS — M62838 Other muscle spasm: Secondary | ICD-10-CM | POA: Diagnosis not present

## 2023-11-04 NOTE — Therapy (Signed)
 OUTPATIENT PHYSICAL THERAPY TREATMENT / PROGRESS NOTE / RE-CERTIFCATION Dates of reporting from 08/12/2023 to 11/04/2023  Patient Name: Theresa Ortiz MRN: [id number] DOB:July 19, 1990, 33 y.o., female Today's Date: 11/04/2023  END OF SESSION:    PT End of Session - 11/04/23 1857     Visit Number 50    Number of Visits 74    Date for PT Re-Evaluation 01/27/24    Authorization Type [place] [employer] PPO reporting period from 08/12/2023    Progress Note Due on Visit 50    PT Start Time 1818    PT Stop Time 1900    PT Time Calculation (min) 42 min    Activity Tolerance Patient tolerated treatment well    Behavior During Therapy [facility] for tasks assessed/performed            Past Medical History:  Diagnosis Date   Acute meniscal tear of knee    left knee   Allergy    Anxiety    Asthma    Atrial fibrillation (HCC)    slight   COVID-19 04/2020   Depression    GERD (gastroesophageal reflux disease)    Ovarian cyst    PVC (premature ventricular contraction)    Scoliosis    Shingles    Tachycardia    Past Surgical History:  Procedure Laterality Date   CHOLECYSTECTOMY N/A 07/01/2023   Procedure: LAPAROSCOPIC CHOLECYSTECTOMY;  Surgeon: Ebbie Cough, MD;  Location: [facility] OR;  Service: General;  Laterality: N/A;  GENERAL & TAP BLOCK   KNEE ARTHROSCOPY WITH LATERAL MENISECTOMY Left 09/01/2012   Procedure: LEFT KNEE ARTHROSCOPY WITH LATERAL MENISCECTOMY ;  Surgeon: Tanda DELENA Heading, MD;  Location: [facility];  Service: Orthopedics;  Laterality: Left;   MENISCUS REPAIR Right    RIGHT INDEX  FINGER TENDON REPAIR  NOV 2013   Patient Active Problem List   Diagnosis Date Noted   Elevated liver enzymes 07/21/2023   Administrative encounter 07/21/2023   Helicobacter pylori ab+ 07/21/2023   Leukocytosis 07/21/2023   Microcytosis 07/21/2023   BMI 40.0-44.9, adult (HCC) 07/21/2023   Overactive bladder 07/21/2023   Personal history of COVID-19 07/21/2023   Asthma  04/03/2023   Gastro-esophageal reflux disease without esophagitis 01/22/2019   Palpitation 07/13/2018   Atypical chest pain 07/13/2018   Family history of heart disease 07/13/2018   Shortness of breath 07/13/2018   Sinus tachycardia 07/13/2018   Moderate recurrent major depression (HCC) 05/25/2018   Tobacco user 07/29/2014   Mild persistent asthma, uncomplicated 07/04/2014   Impaired fasting glucose 11/18/2012   Acute lateral meniscus tear of left knee 09/01/2012   Attention deficit hyperactivity disorder, predominantly inattentive type 05/03/2011    PCP: Tisovec, Charlie ORN, MD  REFERRING PROVIDER: Delon Gins, MD  REFERRING DIAG: 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 Pt reports she has some pain at the mid calf that is the size of packing tape that feels like a cramp. She reports she has tension at L low back and glute. LBP/glute pain aggravated by lifting/moving patients, and bending forward. Has had L sided sciatic symptoms 1x/week when lifting something heavy down post LLE. She hs previously  had pain with prolonged walking, but that has subsided over the past year or so. Has had successful PT in the past with [facility], which she thinks is very helpful. Pt works full time as an Engineer, structural, enjoys traveling and walking her dog. Comorbid conditions as listed above. No falls in past 6 months. Pt denies N/V, B&B changes, unexplained weight fluctuation, saddle paresthesia, fever, night sweats, or unrelenting night pain at this time. Patient denies hx of cancer, stroke, seizures, diabetes, unexplained weight loss, unexplained changes in bowel or  bladder problems, unexplained stumbling or dropping things, osteoporosis, and spinal surgery.  SUBJECTIVE:                                                                                                                                                                                           SUBJECTIVE STATEMENT:   Patient states her back has been sore, but not running down her leg. Her R ankle continues to give her trouble. She got her orthotics, but she has not been able to wear them yet because she has not found correct shoes. Ankle pain is going into the top lateral portion of her foot.   PAIN: NPRS: 2/10 at low back and L LE, 2/10 lateral R ankle/foot  Precautions: none  PATIENT GOALS: Get the cramp out of my calf, to eleviate whatever pissed it off this time out of the leg   OBJECTIVE  1RM calculated from <10RM (last tested 09/30/2023) Knee extension at [facility], seat position 1 R: 58.7# L: 43.2# Hamstring curl at [facility], seat position 1 R: 46.7# L: 46.7#   SELF-REPORTED FUNCTION Patient Specific Functional Scale (PSFS)  Repetitively bending over (to do things like picking up sticks from the yard and clean her home): 7/10 Strenuous activity like lifting things: 5/10 Prolonged standing: 7/10 Average: 6.3/10   FUNCTIONAL/BALANCE TESTS: Plank: 35 seconds (aching in back when abs not engaging well) Floor to waist box lift: 45# max before increasing pain in left glute during the lift and a little residual afterwards.   TODAY'S TREATMENT  Neuromuscular Re-education: a technique or exercise performed with the goal of improving the level of communication between the body and the brain, such as for balance, motor control, muscle activation patterns, coordination, desensitization, quality of muscle contraction, proprioception, and/or kinesthetic sense needed for successful and safe completion of functional activities.   Hooklying pelvic tilt AROM in tolerable range A few reps to make  sure she can find it  Hooklying slight PPT with lat pull over 1x10 AROM 1x5 with 8.8 bar > 10 with 23# Barbell Focus and cuing on keeping ribs down and not  letting back arch  Quadruped pelvic tilt AROM in tolerable range 1x10 or so   Quadruped slight PPT with alternating hip extensions without shift Practiced for a while, difficult, improving with cuing  Quadruped bird dog (alternating shoulder flexion/contralateral hip extension with core muscles braced) 1x10 each side with 10 second holds with focus on slight PPT and not shifting Harder than before and with better core awareness.   Therapeutic exercise: therapeutic exercises that incorporate ONE parameter at one or more areas of the body to centralize symptoms, develop strength and endurance, range of motion, and flexibility required for successful completion of functional activities.  Plank to test goals (see above)  Seated lat pull down 1x5 with 35# 1x20 with 45#  Education on HEP including handout   Therapeutic activities: dynamic therapeutic activities incorporating MULTIPLE parameters or areas of the body designed to achieve improved functional performance. Floor to waist box lift testing: 1 rep from 10# increasing by 5# each rep until hit max (45#)   Pt required multimodal cuing for proper technique and to facilitate improved neuromuscular control, strength, range of motion, and functional ability resulting in improved performance and form.   PATIENT EDUCATION:  Education details: Exercise purpose/form. Self management techniques. Education on diagnosis, prognosis, POC, anatomy and physiology of current condition. Education on HEP including handout. Person educated: Patient Education method: Medical illustrator Education comprehension: verbalized understanding and returned demonstration, needs further education.   HOME EXERCISE PROGRAM: Access Code: [email or web address] URL: [email or web address] Date:  10/21/2023 Prepared by: Camie Cleverly  Exercises - Bird Dog  - 1 x daily - 2 sets - 5 reps - 10 second hold - Standard Plank  - 1 x daily - 3 reps - 30 seconds hold - Side Plank on Knees  - 1 x daily - 2 sets - 5 reps - 5-10 seconds hold   ASSESSMENT:  CLINICAL IMPRESSION:   Patient has attended 50 physical therapy sessions since starting current episode of care on 07/01/2022. Since her last progress note, patient has returned to improved activity tolerance near before her cholecystectomy, but she did have a flair in back pain about 3 weeks ago, that seems to be quieting down now. She has improved her lifting capacity, PSFS, and is back to regular participation in HEP. However, she continues to have limitations in lifting for her job due to low back and radiating leg pain and has not yet reached her goals. Patient would benefit from continued management of limiting condition by skilled physical therapist to address remaining impairments and functional limitations to work towards stated goals and return to PLOF or maximal functional independence.    OBJECTIVE IMPAIRMENTS: Abnormal gait, decreased activity tolerance, decreased balance, decreased endurance, decreased knowledge of condition, decreased mobility, difficulty walking, decreased ROM, decreased strength, increased fascial restrictions, impaired perceived functional ability, increased muscle spasms, impaired flexibility, improper body mechanics, postural dysfunction, obesity, and pain.   ACTIVITY LIMITATIONS: carrying, lifting, bending, sitting, standing, squatting, stairs, transfers, hygiene/grooming, locomotion level, and caring for others  PARTICIPATION LIMITATIONS: meal prep, cleaning, laundry, driving, shopping, community activity, occupation, yard work, and  working, walking, prolonged standing, bending, lifting, yardwork, housework, sleeping, traveling, prolonged sitting, going to [employer], anything that requires repetitive bending or  lifting   PERSONAL FACTORS: Fitness, Past/current experiences, Time since onset of injury/illness/exacerbation, and 1-2 comorbidities: lateral meniscus tear of left knee; Palpitation; Atypical chest pain; Family history of heart disease; Shortness of breath; and Sinus tachycardia on their problem list. past medical history of Acute meniscal  tear of knee, Allergy, Anxiety, Asthma, Atrial fibrillation (HCC), COVID-19 (04/2020), Depression, Ovarian cyst, PVC (premature ventricular contraction), Scoliosis, Shingles, and Tachycardia. past surgical history that includes RIGHT INDEX  FINGER TENDON REPAIR (NOV 2013); Knee arthroscopy with lateral menisectomy (Left, 09/01/2012); and Meniscus repair (Right) are also affecting patient's functional outcome.   REHAB POTENTIAL: Good  CLINICAL DECISION MAKING: Evolving/moderate complexity  EVALUATION COMPLEXITY: Moderate  GOALS: Goals reviewed with patient? Yes  SHORT TERM GOALS: Target date: 08/07/22. Target date updated to 12/04/2022 on 11/19/2022.   Pt will be independent with initial HEP in order to improve strength and balance in order to decrease fall risk and improve function at home and work. Baseline:HEP given; patient not participating (11/19/2022); participating regularly (12/02/2022);  Goal status: MET  LONG TERM GOALS: Target date: 08/26/22. Target date updated to 02/11/2023 for all unmet goals on 11/19/2022. Target date updated to 04/14/2023 for all unmet goals on 01/20/2023. Target date updated to 10/21/2023 or all unmet goals on 07/29/2023. Target date updated to 01/27/2024 for all unmet goals on 11/04/2023.   Patient will demonstrate improved FOTO by equal or greater than 10 by visit #17 to demonstrate improvement in overall condition and self-reported functional ability.  Baseline: initial baseline 55 (07/01/2022); 60 at visit #17 (12/26/2022); 66 at visit #30 Goal status: MET (Discontinued 07/29/2023 due to clinic no longer having access to this measure).   2.   Pt will decrease worst pain as reported on NPRS by at least 3 points in order to demonstrate clinically significant reduction in pain. Baseline: 4/10 (07/01/2022): up to 10/10  (11/19/2022); up to 4-5/10 when burning two days ago, 2.5/10 when excluding the burning (12/06/2022); 3-4/10 in the last 2 weeks (01/20/2023); Pain is 1.5/10 over the past 2 weeks (08/12/2023); up to 3/10 over the last two weeks (11/04/2023);  Goal status: on-going  3.  Pt will demonstrate plank time of or more to demonstrate age-predicted match of core strength needed for job duties Baseline: 10sec (07/01/2022): 24 seconds (11/19/2022); 23 seconds (12/26/2022); 37 seconds (01/20/2023); 35 seconds (04/01/2023); 29 seconds (07/29/2023); 35 seconds (11/04/2023);  Goal status: In-progress  4.  Pt will demonstrate bering sorenson test of 34sec or more to demonstrate clinically significant increase in spinal extensor strength needed for job duties Baseline: 20sec (07/01/2022); 58 seconds (11/19/2022); deferred(04/01/23); 54 seconds (07/29/2023);  Goal status: MET  5.  Patient will be independent with long term HEP program for self-management of symptoms. Baseline: HEP to be reviewed and updated as needed at visit 7 as appropriate (11/19/22); she has has been participating 2x a week plus PT sessions + what  is left over after PT session (12/26/2022); has not been doing since cholecystectomy on 06/30/2023 (07/29/2023); she is doing them 4x a week (11/04/2023);  Goal status: MET, now regressed (07/29/2023); progressing again (11/04/2023);   6.  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  Baseline: 40# (07/29/23); 45# (11/04/2023);  Goal status: in progress  7.  Patient will demonstrate improvement in Patient Specific Functional Scale (PSFS) of equal or greater than 8/10 points to reflect clinically significant improvement in patient's most valued functional  activities.. Baseline: 4/10 (07/29/23); 6.3/10 (11/04/2023);  Goal status: pin progress  PLAN:  PT FREQUENCY: 1-2x/week  PT DURATION: 12 weeks  PLANNED INTERVENTIONS: Therapeutic exercises, Therapeutic activity, Neuromuscular re-education, Balance training, Gait training, Patient/Family education, Self Care, Joint mobilization, Stair training, Dry Needling, Electrical stimulation,  Spinal mobilization, Cryotherapy, Moist heat, Manual therapy, and Re-evaluation.  PLAN FOR NEXT SESSION:  Update HEP as appropriate, progressive core/LE/functional strengthening/motor control  exercises.    Camie SAUNDERS. Juli, PT, DPT 11/04/23, 7:48 PM  [facility] [address] [place], [place] [address] P: [phone] I F: [phone]

## 2023-11-12 ENCOUNTER — Ambulatory Visit: Admitting: Physical Therapy

## 2023-11-18 ENCOUNTER — Encounter: Payer: Self-pay | Admitting: Physical Therapy

## 2023-11-18 ENCOUNTER — Ambulatory Visit: Admitting: Physical Therapy

## 2023-11-18 DIAGNOSIS — M62838 Other muscle spasm: Secondary | ICD-10-CM

## 2023-11-18 DIAGNOSIS — M5459 Other low back pain: Secondary | ICD-10-CM

## 2023-11-18 DIAGNOSIS — M792 Neuralgia and neuritis, unspecified: Secondary | ICD-10-CM

## 2023-11-18 NOTE — Therapy (Signed)
 OUTPATIENT PHYSICAL THERAPY TREATMENT  Patient Name: Theresa Ortiz MRN: [id number] DOB:23-Apr-1991, 33 y.o., female Today's Date: 11/18/2023  END OF SESSION:    PT End of Session - 11/18/23 1737     Visit Number 51    Number of Visits 74    Date for PT Re-Evaluation 01/27/24    Authorization Type [place] [employer] PPO reporting period from 11/04/2023    Progress Note Due on Visit 50    PT Start Time 1736    PT Stop Time 1818    PT Time Calculation (min) 42 min    Activity Tolerance Patient tolerated treatment well    Behavior During Therapy [facility] for tasks assessed/performed             Past Medical History:  Diagnosis Date   Acute meniscal tear of knee    left knee   Allergy    Anxiety    Asthma    Atrial fibrillation (HCC)    slight   COVID-19 04/2020   Depression    GERD (gastroesophageal reflux disease)    Ovarian cyst    PVC (premature ventricular contraction)    Scoliosis    Shingles    Tachycardia    Past Surgical History:  Procedure Laterality Date   CHOLECYSTECTOMY N/A 07/01/2023   Procedure: LAPAROSCOPIC CHOLECYSTECTOMY;  Surgeon: Ebbie Cough, MD;  Location: [facility] OR;  Service: General;  Laterality: N/A;  GENERAL & TAP BLOCK   KNEE ARTHROSCOPY WITH LATERAL MENISECTOMY Left 09/01/2012   Procedure: LEFT KNEE ARTHROSCOPY WITH LATERAL MENISCECTOMY ;  Surgeon: Tanda DELENA Heading, MD;  Location: [facility];  Service: Orthopedics;  Laterality: Left;   MENISCUS REPAIR Right    RIGHT INDEX  FINGER TENDON REPAIR  NOV 2013   Patient Active Problem List   Diagnosis Date Noted   Elevated liver enzymes 07/21/2023   Administrative encounter 07/21/2023   Helicobacter pylori ab+ 07/21/2023   Leukocytosis 07/21/2023   Microcytosis 07/21/2023   BMI 40.0-44.9, adult (HCC) 07/21/2023   Overactive bladder 07/21/2023   Personal history of COVID-19 07/21/2023   Asthma 04/03/2023   Gastro-esophageal reflux disease without esophagitis 01/22/2019    Palpitation 07/13/2018   Atypical chest pain 07/13/2018   Family history of heart disease 07/13/2018   Shortness of breath 07/13/2018   Sinus tachycardia 07/13/2018   Moderate recurrent major depression (HCC) 05/25/2018   Tobacco user 07/29/2014   Mild persistent asthma, uncomplicated 07/04/2014   Impaired fasting glucose 11/18/2012   Acute lateral meniscus tear of left knee 09/01/2012   Attention deficit hyperactivity disorder, predominantly inattentive type 05/03/2011    PCP: Tisovec, Charlie ORN, MD  REFERRING PROVIDER: Delon Gins, MD  REFERRING DIAG: 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 Pt reports she has some pain at the mid calf that is the size of packing tape that feels like a cramp. She reports she has tension at L low back and glute. LBP/glute pain aggravated by lifting/moving patients, and bending forward. Has had L sided sciatic symptoms 1x/week when lifting something heavy down post LLE. She hs previously had pain with prolonged walking, but that has subsided over the  past year or so. Has had successful PT in the past with [facility], which she thinks is very helpful. Pt works full time as an Engineer, structural, enjoys traveling and walking her dog. Comorbid conditions as listed above. No falls in past 6 months. Pt denies N/V, B&B changes, unexplained weight fluctuation, saddle paresthesia, fever, night sweats, or unrelenting night pain at this time. Patient denies hx of cancer, stroke, seizures, diabetes, unexplained weight loss, unexplained changes in bowel or bladder problems, unexplained stumbling or dropping things, osteoporosis, and  spinal surgery.  SUBJECTIVE:                                                                                                                                                                                           SUBJECTIVE STATEMENT:   Patient states she has only had one episode of pain/paresthesia down her left leg when she did an intubation case recently. It was temporary. She also had some back tightness with repetitive lifting when she had to move some pavers. HEP has been going better. The hardest one is the regular plank. Ankle is better but still feeling like it is making her toes curl. She does try to elevate it at night and ice it.   PAIN: NPRS: 1/10 soreness/tightness/tenderness at left low back and L glute, 4/10 lateral R ankle/foot.   Precautions: none  PATIENT GOALS: Get the cramp out of my calf, to eleviate whatever pissed it off this time out of the leg   OBJECTIVE  1RM calculated from <10RM (last tested 09/30/2023) Knee extension at [facility], seat position 1 R: 58.7# L: 43.2# Hamstring curl at [facility], seat position 1 R: 46.7# L: 46.7#     TODAY'S TREATMENT  Neuromuscular Re-education: a technique or exercise performed with the goal of improving the level of communication between the body and the brain, such as for balance, motor control, muscle activation patterns, coordination, desensitization, quality of muscle contraction, proprioception, and/or kinesthetic sense needed for successful and safe completion of functional activities.   Quadruped bird dog (alternating shoulder flexion/contralateral hip extension with core muscles braced) 1x10 each side with 10 second holds with focus on slight PPT and not shifting Cuing to engage abs like in plank  2x5 each 10 second holds diagonal side with diagonal reaches.   Chinese Plank  5x30 seconds each Cuing to improve anterior core coordination with glute and low back Increased soreness in left glute to left thigh  Side  plank (elbows to knees) with clam shell 3x10 each side  Education on HEP including handout   Pt required multimodal cuing for proper technique and to facilitate improved neuromuscular  control, strength, range of motion, and functional ability resulting in improved performance and form.   PATIENT EDUCATION:  Education details: Exercise purpose/form. Self management techniques. Education on diagnosis, prognosis, POC, anatomy and physiology of current condition. Education on HEP including handout. Person educated: Patient Education method: Medical illustrator Education comprehension: verbalized understanding and returned demonstration, needs further education.   HOME EXERCISE PROGRAM: Access Code: [email or web address] URL: [email or web address] Date: 11/18/2023 Prepared by: Camie Cleverly  Exercises - Bird Dog  - 1 x daily - 2 sets - 5 reps - 10 second hold - Standard Plank  - 1 x daily - 3 reps - 40 seconds hold - Side Plank with Clam  - 1 x daily - 3 sets - 10 reps  ASSESSMENT:  CLINICAL IMPRESSION:   Focused on progressing core coordination and endurance exercises. Patient appeared to be tolerating well, then had some increased left glute and thigh pain with chinese plank exercises that seemed to resolve to just some soreness in the left glute. However, she noted she had a little discomfort down to the ankle that she seemed unconcerned about by the end of the visit. She is demonstrating improved motor control and trunk/lumbopelvic endurance but continues to have symptoms and fatigues quickly or has trouble coordinating more complex movements. Patient would benefit from continued management of limiting condition by skilled physical therapist to address remaining impairments and functional limitations to work towards stated goals and return to PLOF or maximal functional independence.     OBJECTIVE IMPAIRMENTS: Abnormal gait, decreased activity tolerance, decreased balance,  decreased endurance, decreased knowledge of condition, decreased mobility, difficulty walking, decreased ROM, decreased strength, increased fascial restrictions, impaired perceived functional ability, increased muscle spasms, impaired flexibility, improper body mechanics, postural dysfunction, obesity, and pain.   ACTIVITY LIMITATIONS: carrying, lifting, bending, sitting, standing, squatting, stairs, transfers, hygiene/grooming, locomotion level, and caring for others  PARTICIPATION LIMITATIONS: meal prep, cleaning, laundry, driving, shopping, community activity, occupation, yard work, and  working, walking, prolonged standing, bending, lifting, yardwork, housework, sleeping, traveling, prolonged sitting, going to [employer], anything that requires repetitive bending or lifting   PERSONAL FACTORS: Fitness, Past/current experiences, Time since onset of injury/illness/exacerbation, and 1-2 comorbidities: lateral meniscus tear of left knee; Palpitation; Atypical chest pain; Family history of heart disease; Shortness of breath; and Sinus tachycardia on their problem list. past medical history of Acute meniscal tear of knee, Allergy, Anxiety, Asthma, Atrial fibrillation (HCC), COVID-19 (04/2020), Depression, Ovarian cyst, PVC (premature ventricular contraction), Scoliosis, Shingles, and Tachycardia. past surgical history that includes RIGHT INDEX  FINGER TENDON REPAIR (NOV 2013); Knee arthroscopy with lateral menisectomy (Left, 09/01/2012); and Meniscus repair (Right) are also affecting patient's functional outcome.   REHAB POTENTIAL: Good  CLINICAL DECISION MAKING: Evolving/moderate complexity  EVALUATION COMPLEXITY: Moderate  GOALS: Goals reviewed with patient? Yes  SHORT TERM GOALS: Target date: 08/07/22. Target date updated to 12/04/2022 on 11/19/2022.   Pt will be independent with initial HEP in order to improve strength and balance in order to decrease fall risk and improve function at home and  work. Baseline:HEP given; patient not participating (11/19/2022); participating regularly (12/02/2022);  Goal status: MET  LONG TERM GOALS: Target date: 08/26/22. Target date updated to 02/11/2023 for all unmet goals on 11/19/2022. Target date updated to 04/14/2023 for all unmet goals on 01/20/2023. Target date updated to 10/21/2023 or all unmet goals on 07/29/2023. Target date updated to 01/27/2024 for all unmet goals on 11/04/2023.   Patient will demonstrate improved FOTO by equal or greater than 10  by visit #17 to demonstrate improvement in overall condition and self-reported functional ability.  Baseline: initial baseline 55 (07/01/2022); 60 at visit #17 (12/26/2022); 66 at visit #30 Goal status: MET (Discontinued 07/29/2023 due to clinic no longer having access to this measure).   2.  Pt will decrease worst pain as reported on NPRS by at least 3 points in order to demonstrate clinically significant reduction in pain. Baseline: 4/10 (07/01/2022): up to 10/10  (11/19/2022); up to 4-5/10 when burning two days ago, 2.5/10 when excluding the burning (12/06/2022); 3-4/10 in the last 2 weeks (01/20/2023); Pain is 1.5/10 over the past 2 weeks (08/12/2023); up to 3/10 over the last two weeks (11/04/2023);  Goal status: on-going  3.  Pt will demonstrate plank time of or more to demonstrate age-predicted match of core strength needed for job duties Baseline: 10sec (07/01/2022): 24 seconds (11/19/2022); 23 seconds (12/26/2022); 37 seconds (01/20/2023); 35 seconds (04/01/2023); 29 seconds (07/29/2023); 35 seconds (11/04/2023);  Goal status: In-progress  4.  Pt will demonstrate bering sorenson test of 34sec or more to demonstrate clinically significant increase in spinal extensor strength needed for job duties Baseline: 20sec (07/01/2022); 58 seconds (11/19/2022); deferred(04/01/23); 54 seconds (07/29/2023);  Goal status: MET  5.  Patient will be independent with long term HEP program for self-management of symptoms. Baseline: HEP to be  reviewed and updated as needed at visit 7 as appropriate (11/19/22); she has has been participating 2x a week plus PT sessions + what  is left over after PT session (12/26/2022); has not been doing since cholecystectomy on 06/30/2023 (07/29/2023); she is doing them 4x a week (11/04/2023);  Goal status: MET, now regressed (07/29/2023); progressing again (11/04/2023);   6.  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  Baseline: 40# (07/29/23); 45# (11/04/2023);  Goal status: in progress  7.  Patient will demonstrate improvement in Patient Specific Functional Scale (PSFS) of equal or greater than 8/10 points to reflect clinically significant improvement in patient's most valued functional activities.. Baseline: 4/10 (07/29/23); 6.3/10 (11/04/2023);  Goal status: pin progress  PLAN:  PT FREQUENCY: 1-2x/week  PT DURATION: 12 weeks  PLANNED INTERVENTIONS: Therapeutic exercises, Therapeutic activity, Neuromuscular re-education, Balance training, Gait training, Patient/Family education, Self Care, Joint mobilization, Stair training, Dry Needling, Electrical stimulation, Spinal mobilization, Cryotherapy, Moist heat, Manual therapy, and Re-evaluation.  PLAN FOR NEXT SESSION:  Update HEP as appropriate, progressive core/LE/functional strengthening/motor control  exercises.    Camie SAUNDERS. Juli, PT, DPT 11/18/23, 7:37 PM  [facility] [address] [place], [place] [address] P: [phone] I F: [phone]

## 2023-11-25 ENCOUNTER — Encounter: Payer: Self-pay | Admitting: Physical Therapy

## 2023-11-25 ENCOUNTER — Ambulatory Visit: Admitting: Physical Therapy

## 2023-11-25 DIAGNOSIS — M792 Neuralgia and neuritis, unspecified: Secondary | ICD-10-CM

## 2023-11-25 DIAGNOSIS — M5459 Other low back pain: Secondary | ICD-10-CM

## 2023-11-25 DIAGNOSIS — M62838 Other muscle spasm: Secondary | ICD-10-CM

## 2023-11-25 NOTE — Therapy (Signed)
 OUTPATIENT PHYSICAL THERAPY TREATMENT  Patient Name: Theresa Ortiz MRN: [id number] DOB:[birth year], 33 y.o., female Today's Date: 11/25/2023  END OF SESSION:    PT End of Session - 11/25/23 1748     Visit Number 52    Number of Visits 74    Date for PT Re-Evaluation 01/27/24    Authorization Type  [employer] PPO reporting period from 11/04/2023    Progress Note Due on Visit 50    PT Start Time 1738    PT Stop Time 1816    PT Time Calculation (min) 38 min    Activity Tolerance Patient tolerated treatment well    Behavior During Therapy [facility] for tasks assessed/performed              Past Medical History:  Diagnosis Date   Acute meniscal tear of knee    left knee   Allergy    Anxiety    Asthma    Atrial fibrillation (HCC)    slight   COVID-19 04/2020   Depression    GERD (gastroesophageal reflux disease)    Ovarian cyst    PVC (premature ventricular contraction)    Scoliosis    Shingles    Tachycardia    Past Surgical History:  Procedure Laterality Date   CHOLECYSTECTOMY N/A 07/01/2023   Procedure: LAPAROSCOPIC CHOLECYSTECTOMY;  Surgeon: Ebbie Cough, MD;  Location: [facility] OR;  Service: General;  Laterality: N/A;  GENERAL & TAP BLOCK   KNEE ARTHROSCOPY WITH LATERAL MENISECTOMY Left 09/01/2012   Procedure: LEFT KNEE ARTHROSCOPY WITH LATERAL MENISCECTOMY ;  Surgeon: Tanda DELENA Heading, MD;  Location: [facility];  Service: Orthopedics;  Laterality: Left;   MENISCUS REPAIR Right    RIGHT INDEX  FINGER TENDON REPAIR  NOV 2013   Patient Active Problem List   Diagnosis Date Noted   Elevated liver enzymes 07/21/2023   Administrative encounter 07/21/2023   Helicobacter pylori ab+ 07/21/2023   Leukocytosis 07/21/2023   Microcytosis 07/21/2023   BMI 40.0-44.9, adult (HCC) 07/21/2023   Overactive bladder 07/21/2023   Personal history of COVID-19 07/21/2023   Asthma 04/03/2023   Gastro-esophageal reflux disease without esophagitis 01/22/2019    Palpitation 07/13/2018   Atypical chest pain 07/13/2018   Family history of heart disease 07/13/2018   Shortness of breath 07/13/2018   Sinus tachycardia 07/13/2018   Moderate recurrent major depression (HCC) 05/25/2018   Tobacco user 07/29/2014   Mild persistent asthma, uncomplicated 07/04/2014   Impaired fasting glucose 11/18/2012   Acute lateral meniscus tear of left knee 09/01/2012   Attention deficit hyperactivity disorder, predominantly inattentive type 05/03/2011    PCP: Tisovec, Charlie ORN, MD  REFERRING PROVIDER: Delon Gins, MD  REFERRING DIAG: 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 Pt reports she has some pain at the mid calf that is the size of packing tape that feels like a cramp. She reports she has tension at L low back and glute. LBP/glute pain aggravated by lifting/moving patients, and bending forward. Has had L sided sciatic symptoms 1x/week when lifting something heavy down post LLE. She hs previously had pain with prolonged walking, but that has subsided over  the past year or so. Has had successful PT in the past with [facility], which she thinks is very helpful. Pt works full time as an Engineer, structural, enjoys traveling and walking her dog. Comorbid conditions as listed above. No falls in past 6 months. Pt denies N/V, B&B changes, unexplained weight fluctuation, saddle paresthesia, fever, night sweats, or unrelenting night pain at this time. Patient denies hx of cancer, stroke, seizures, diabetes, unexplained weight loss, unexplained changes in bowel or bladder problems, unexplained stumbling or dropping things, osteoporosis, and  spinal surgery.  SUBJECTIVE:                                                                                                                                                                                           SUBJECTIVE STATEMENT:   Patient states her ankle is the same. She states her back is about the same, sore across the buttock L > R. The cramping she had in the medial L ankle at the end of last PT session resolved about 1 hour later. She also felt this for a short period after moving a patient on Monday.   PAIN: NPRS: 1/10 soreness/tightness/tenderness at left low back and L glute, 4/10 lateral R ankle/foot.   Precautions: none  PATIENT GOALS: Get the cramp out of my calf, to eleviate whatever pissed it off this time out of the leg   OBJECTIVE  1RM calculated from <10RM (last tested 09/30/2023) Knee extension at [place], seat position 1 R: 58.7# L: 43.2# Hamstring curl at [place], seat position 1 R: 46.7# L: 46.7#     TODAY'S TREATMENT  Neuromuscular Re-education: a technique or exercise performed with the goal of improving the level of communication between the body and the brain, such as for balance, motor control, muscle activation patterns, coordination, desensitization, quality of muscle contraction, proprioception, and/or kinesthetic sense needed for successful and safe completion of functional activities.   Sidelying clamshell plank, dropping down each rep 2x10 each side 1x10 each side Cuing for improved lower leg activation and form.   Quadruped bird dog (alternating shoulder flexion/contralateral hip extension with core muscles braced) 2x10 each side with 10 second holds with 5#AW on ankles and 3#AW on wrists, with focus on slight PPT and not shifting Cuing to engage abs like in plank   Chinese Plank  5x30 seconds each 30 second rest between each rep Cuing to improve anterior core coordination with glute and low back Increased soreness in left glute to left  thigh  Therapeutic exercise: therapeutic exercises that incorporate ONE parameter at one or more areas of the body to centralize symptoms, develop strength and endurance,  range of motion, and flexibility required for successful completion of functional activities. Seated lat pull down with slight backwards lean to improve 1x15 at 45# 2x10 at 55#  Education on HEP including handout   Pt required multimodal cuing for proper technique and to facilitate improved neuromuscular control, strength, range of motion, and functional ability resulting in improved performance and form.   PATIENT EDUCATION:  Education details: Exercise purpose/form. Self management techniques. Education on diagnosis, prognosis, POC, anatomy and physiology of current condition. Education on HEP including handout. Person educated: Patient Education method: Medical illustrator Education comprehension: verbalized understanding and returned demonstration, needs further education.   HOME EXERCISE PROGRAM: Access Code: [email or web address] URL: [email or web address] Date: 11/18/2023 Prepared by: Camie Cleverly  Exercises - Bird Dog  - 1 x daily - 2 sets - 5 reps - 10 second hold - Standard Plank  - 1 x daily - 3 reps - 40 seconds hold - Side Plank with Clam  - 1 x daily - 3 sets - 10 reps  ASSESSMENT:  CLINICAL IMPRESSION:   Continued focus on lumbopelvic control with increasing resistance. Patient appropriately challenged with no lasting pain. She had some feeling of increased effort at the left glute not below the knee that was resolving by end of session. Plan to continue with progressive motor control, strengthening, and functional exercises. Patient would benefit from continued management of limiting condition by skilled physical therapist to address remaining impairments and functional limitations to work towards stated goals and return to PLOF or maximal functional independence.     OBJECTIVE  IMPAIRMENTS: Abnormal gait, decreased activity tolerance, decreased balance, decreased endurance, decreased knowledge of condition, decreased mobility, difficulty walking, decreased ROM, decreased strength, increased fascial restrictions, impaired perceived functional ability, increased muscle spasms, impaired flexibility, improper body mechanics, postural dysfunction, obesity, and pain.   ACTIVITY LIMITATIONS: carrying, lifting, bending, sitting, standing, squatting, stairs, transfers, hygiene/grooming, locomotion level, and caring for others  PARTICIPATION LIMITATIONS: meal prep, cleaning, laundry, driving, shopping, community activity, occupation, yard work, and  working, walking, prolonged standing, bending, lifting, yardwork, housework, sleeping, traveling, prolonged sitting, going to [employer], anything that requires repetitive bending or lifting   PERSONAL FACTORS: Fitness, Past/current experiences, Time since onset of injury/illness/exacerbation, and 1-2 comorbidities: lateral meniscus tear of left knee; Palpitation; Atypical chest pain; Family history of heart disease; Shortness of breath; and Sinus tachycardia on their problem list. past medical history of Acute meniscal tear of knee, Allergy, Anxiety, Asthma, Atrial fibrillation (HCC), COVID-19 (04/2020), Depression, Ovarian cyst, PVC (premature ventricular contraction), Scoliosis, Shingles, and Tachycardia. past surgical history that includes RIGHT INDEX  FINGER TENDON REPAIR (NOV 2013); Knee arthroscopy with lateral menisectomy (Left, 09/01/2012); and Meniscus repair (Right) are also affecting patient's functional outcome.   REHAB POTENTIAL: Good  CLINICAL DECISION MAKING: Evolving/moderate complexity  EVALUATION COMPLEXITY: Moderate  GOALS: Goals reviewed with patient? Yes  SHORT TERM GOALS: Target date: 08/07/22. Target date updated to 12/04/2022 on 11/19/2022.   Pt will be independent with initial HEP in order to improve strength and  balance in order to decrease fall risk and improve function at home and work. Baseline:HEP given; patient not participating (11/19/2022); participating regularly (12/02/2022);  Goal status: MET  LONG TERM GOALS: Target date: 08/26/22. Target date updated to 02/11/2023 for all unmet goals on 11/19/2022. Target date updated to 04/14/2023 for all unmet goals on 01/20/2023. Target date updated to 10/21/2023 or all unmet goals on 07/29/2023. Target date updated to 01/27/2024 for all unmet goals on 11/04/2023.   Patient  will demonstrate improved FOTO by equal or greater than 10 by visit #17 to demonstrate improvement in overall condition and self-reported functional ability.  Baseline: initial baseline 55 (07/01/2022); 60 at visit #17 (12/26/2022); 66 at visit #30 Goal status: MET (Discontinued 07/29/2023 due to clinic no longer having access to this measure).   2.  Pt will decrease worst pain as reported on NPRS by at least 3 points in order to demonstrate clinically significant reduction in pain. Baseline: 4/10 (07/01/2022): up to 10/10  (11/19/2022); up to 4-5/10 when burning two days ago, 2.5/10 when excluding the burning (12/06/2022); 3-4/10 in the last 2 weeks (01/20/2023); Pain is 1.5/10 over the past 2 weeks (08/12/2023); up to 3/10 over the last two weeks (11/04/2023);  Goal status: on-going  3.  Pt will demonstrate plank time of or more to demonstrate age-predicted match of core strength needed for job duties Baseline: 10sec (07/01/2022): 24 seconds (11/19/2022); 23 seconds (12/26/2022); 37 seconds (01/20/2023); 35 seconds (04/01/2023); 29 seconds (07/29/2023); 35 seconds (11/04/2023);  Goal status: In-progress  4.  Pt will demonstrate bering sorenson test of 34sec or more to demonstrate clinically significant increase in spinal extensor strength needed for job duties Baseline: 20sec (07/01/2022); 58 seconds (11/19/2022); deferred(04/01/23); 54 seconds (07/29/2023);  Goal status: MET  5.  Patient will be independent with long  term HEP program for self-management of symptoms. Baseline: HEP to be reviewed and updated as needed at visit 7 as appropriate (11/19/22); she has has been participating 2x a week plus PT sessions + what  is left over after PT session (12/26/2022); has not been doing since cholecystectomy on 06/30/2023 (07/29/2023); she is doing them 4x a week (11/04/2023);  Goal status: MET, now regressed (07/29/2023); progressing again (11/04/2023);   6.  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  Baseline: 40# (07/29/23); 45# (11/04/2023);  Goal status: in progress  7.  Patient will demonstrate improvement in Patient Specific Functional Scale (PSFS) of equal or greater than 8/10 points to reflect clinically significant improvement in patient's most valued functional activities.. Baseline: 4/10 (07/29/23); 6.3/10 (11/04/2023);  Goal status: pin progress  PLAN:  PT FREQUENCY: 1-2x/week  PT DURATION: 12 weeks  PLANNED INTERVENTIONS: Therapeutic exercises, Therapeutic activity, Neuromuscular re-education, Balance training, Gait training, Patient/Family education, Self Care, Joint mobilization, Stair training, Dry Needling, Electrical stimulation, Spinal mobilization, Cryotherapy, Moist heat, Manual therapy, and Re-evaluation.  PLAN FOR NEXT SESSION:  Update HEP as appropriate, progressive core/LE/functional strengthening/motor control  exercises.    Camie SAUNDERS. Juli, PT, DPT 11/25/23, 8:47 PM  [facility] [address] [place], [place] [address] P: [phone] I F: [phone]

## 2023-12-02 ENCOUNTER — Ambulatory Visit: Attending: Physical Medicine & Rehabilitation | Admitting: Physical Therapy

## 2023-12-02 DIAGNOSIS — M5459 Other low back pain: Secondary | ICD-10-CM | POA: Insufficient documentation

## 2023-12-02 DIAGNOSIS — M792 Neuralgia and neuritis, unspecified: Secondary | ICD-10-CM | POA: Diagnosis present

## 2023-12-02 DIAGNOSIS — M62838 Other muscle spasm: Secondary | ICD-10-CM | POA: Diagnosis present

## 2023-12-02 NOTE — Therapy (Signed)
 OUTPATIENT PHYSICAL THERAPY TREATMENT  Patient Name: Theresa Ortiz MRN: [id number] DOB:[birth year], 33 y.o., female Today's Date: 12/03/2023  END OF SESSION:    PT End of Session - 12/03/23 2116     Visit Number 53    Number of Visits 74    Date for PT Re-Evaluation 01/27/24    Authorization Type [place] [employer] PPO reporting period from 11/04/2023    Progress Note Due on Visit 50    PT Start Time 1609    PT Stop Time 1645    PT Time Calculation (min) 36 min    Activity Tolerance Patient tolerated treatment well    Behavior During Therapy [facility] for tasks assessed/performed               Past Medical History:  Diagnosis Date   Acute meniscal tear of knee    left knee   Allergy    Anxiety    Asthma    Atrial fibrillation (HCC)    slight   COVID-19 04/2020   Depression    GERD (gastroesophageal reflux disease)    Ovarian cyst    PVC (premature ventricular contraction)    Scoliosis    Shingles    Tachycardia    Past Surgical History:  Procedure Laterality Date   CHOLECYSTECTOMY N/A 07/01/2023   Procedure: LAPAROSCOPIC CHOLECYSTECTOMY;  Surgeon: Ebbie Cough, MD;  Location: [facility] OR;  Service: General;  Laterality: N/A;  GENERAL & TAP BLOCK   KNEE ARTHROSCOPY WITH LATERAL MENISECTOMY Left 09/01/2012   Procedure: LEFT KNEE ARTHROSCOPY WITH LATERAL MENISCECTOMY ;  Surgeon: Tanda DELENA Heading, MD;  Location: [facility];  Service: Orthopedics;  Laterality: Left;   MENISCUS REPAIR Right    RIGHT INDEX  FINGER TENDON REPAIR  NOV 2013   Patient Active Problem List   Diagnosis Date Noted   Elevated liver enzymes 07/21/2023   Administrative encounter 07/21/2023   Helicobacter pylori ab+ 07/21/2023   Leukocytosis 07/21/2023   Microcytosis 07/21/2023   BMI 40.0-44.9, adult (HCC) 07/21/2023   Overactive bladder 07/21/2023   Personal history of COVID-19 07/21/2023   Asthma 04/03/2023   Gastro-esophageal reflux disease without esophagitis 01/22/2019    Palpitation 07/13/2018   Atypical chest pain 07/13/2018   Family history of heart disease 07/13/2018   Shortness of breath 07/13/2018   Sinus tachycardia 07/13/2018   Moderate recurrent major depression (HCC) 05/25/2018   Tobacco user 07/29/2014   Mild persistent asthma, uncomplicated 07/04/2014   Impaired fasting glucose 11/18/2012   Acute lateral meniscus tear of left knee 09/01/2012   Attention deficit hyperactivity disorder, predominantly inattentive type 05/03/2011    PCP: Tisovec, Charlie ORN, MD  REFERRING PROVIDER: Delon Gins, MD  REFERRING DIAG: 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 Pt reports she has some pain at the mid calf that is the size of packing tape that feels like a cramp. She reports she has tension at L low back and glute. LBP/glute pain aggravated by lifting/moving patients, and bending forward. Has had L sided sciatic symptoms 1x/week when lifting something heavy down post LLE. She hs previously had pain with prolonged walking, but that has subsided  over the past year or so. Has had successful PT in the past with [facility], which she thinks is very helpful. Pt works full time as an Engineer, structural, enjoys traveling and walking her dog. Comorbid conditions as listed above. No falls in past 6 months. Pt denies N/V, B&B changes, unexplained weight fluctuation, saddle paresthesia, fever, night sweats, or unrelenting night pain at this time. Patient denies hx of cancer, stroke, seizures, diabetes, unexplained weight loss, unexplained changes in bowel or bladder problems, unexplained stumbling or dropping things, osteoporosis, and  spinal surgery.  SUBJECTIVE:                                                                                                                                                                                           SUBJECTIVE STATEMENT:   Patient states on Saturday she was twisting to the left and the whole left side of her low back cramped up. On Saturday afternoon while doing something around the house she felt something to the inside of her left leg. Today she has felt her entire posterior left thigh and increased tensing into the upper back. Wearing lead has been bothering her today (hasn't recently). L hip has been more tired than usual. She would like to do dry needling today.   PAIN: NPRS: 3/10 pain over low back across both sides of the buttocks and down left let to knee. Odd feeling in her left foot.   Precautions: none  PATIENT GOALS: Get the cramp out of my calf, to eleviate whatever pissed it off this time out of the leg   OBJECTIVE  1RM calculated from <10RM (last tested 09/30/2023) Knee extension at [facility], seat position 1 R: 58.7# L: 43.2# Hamstring curl at [facility], seat position 1 R: 46.7# L: 46.7#     TODAY'S TREATMENT  Trigger Point Dry Needling  Subsequent Treatment: Instructions provided previously at initial dry needling treatment.  Instructions reviewed, if requested by the patient, prior to subsequent dry needling treatment.   Patient Verbal Consent Given: Yes Education Handout Provided: Previously Provided Muscles Treated: 9 dry needle(s) .30mm x inserted with 6 sticks into bilateral lumbar multifidi muscles L3-L5 muscles with patient in prone position, 2 sticks to left QL muscle in sidelying, and 2 sticks in L deep hip rotators in prone to decrease pain and spasms along patient's low back and left LE region.  Electrical Stimulation Performed: No Treatment Response/Outcome: Patient with significant reduction in low back pain and left thigh pain.  Decreased L glute pain but not as much. No unexpected events.   Manual therapy: to reduce pain and tissue tension, improve range of  motion, neuromodulation, in order to promote improved ability to complete functional activities. PRONE STM to bilateral lumbar paraspinals and L > R QL STM to L deep hip rotators and posterior glutes  SIDELYING STM to L QL and L lumbar paraspinals  Pt required multimodal cuing for proper technique and to facilitate improved neuromuscular control, strength, range of motion, and functional ability resulting in improved performance and form.   PATIENT EDUCATION:  Education details: Exercise purpose/form. Self management techniques. Education on diagnosis, prognosis, POC, anatomy and physiology of current condition. Education on HEP including handout. Person educated: Patient Education method: Medical illustrator Education comprehension: verbalized understanding and returned demonstration, needs further education.   HOME EXERCISE PROGRAM: Access Code: [email or web address] URL: [email or web address] Date: 11/18/2023 Prepared by: Camie Cleverly  Exercises - Bird Dog  - 1 x daily - 2 sets - 5 reps - 10 second hold - Standard Plank  - 1 x daily - 3 reps - 40 seconds hold - Side Plank with Clam  - 1 x daily - 3 sets - 10 reps  ASSESSMENT:  CLINICAL IMPRESSION:   Patient with flair in symptoms after rotating spine on Saturday and requested dry needling. Dry needling performed, which significant reduced symptoms especially in low back and left thigh. Patient tolerated well with no unexpected or adverse events. She also tolerated manual therapy STM in the regions needled to improve response. Patient would benefit from continued management of limiting condition by skilled physical therapist to address remaining impairments and functional limitations to work towards stated goals and return to PLOF or maximal functional independence.      OBJECTIVE  IMPAIRMENTS: Abnormal gait, decreased activity tolerance, decreased balance, decreased endurance, decreased knowledge of condition, decreased mobility, difficulty walking, decreased ROM, decreased strength, increased fascial restrictions, impaired perceived functional ability, increased muscle spasms, impaired flexibility, improper body mechanics, postural dysfunction, obesity, and pain.   ACTIVITY LIMITATIONS: carrying, lifting, bending, sitting, standing, squatting, stairs, transfers, hygiene/grooming, locomotion level, and caring for others  PARTICIPATION LIMITATIONS: meal prep, cleaning, laundry, driving, shopping, community activity, occupation, yard work, and  working, walking, prolonged standing, bending, lifting, yardwork, housework, sleeping, traveling, prolonged sitting, going to [employer], anything that requires repetitive bending or lifting   PERSONAL FACTORS: Fitness, Past/current experiences, Time since onset of injury/illness/exacerbation, and 1-2 comorbidities: lateral meniscus tear of left knee; Palpitation; Atypical chest pain; Family history of heart disease; Shortness of breath; and Sinus tachycardia on their problem list. past medical history of Acute meniscal tear of knee, Allergy, Anxiety, Asthma, Atrial fibrillation (HCC), COVID-19 (04/2020), Depression, Ovarian cyst, PVC (premature ventricular contraction), Scoliosis, Shingles, and Tachycardia. past surgical history that includes RIGHT INDEX  FINGER TENDON REPAIR (NOV 2013); Knee arthroscopy with lateral menisectomy (Left, 09/01/2012); and Meniscus repair (Right) are also affecting patient's functional outcome.   REHAB POTENTIAL: Good  CLINICAL DECISION MAKING: Evolving/moderate complexity  EVALUATION COMPLEXITY: Moderate  GOALS: Goals reviewed with patient? Yes  SHORT TERM GOALS: Target date: 08/07/22. Target date updated to 12/04/2022 on 11/19/2022.   Pt will be independent with initial HEP in order to improve strength and  balance in order to decrease fall risk and improve function at home and work. Baseline:HEP given; patient not participating (11/19/2022); participating regularly (12/02/2022);  Goal status: MET  LONG TERM GOALS: Target date: 08/26/22. Target date updated to 02/11/2023 for all unmet goals on 11/19/2022. Target date updated to 04/14/2023 for all unmet goals on 01/20/2023. Target date updated to 10/21/2023 or all unmet goals on 07/29/2023. Target date updated to  01/27/2024 for all unmet goals on 11/04/2023.   Patient will demonstrate improved FOTO by equal or greater than 10 by visit #17 to demonstrate improvement in overall condition and self-reported functional ability.  Baseline: initial baseline 55 (07/01/2022); 60 at visit #17 (12/26/2022); 66 at visit #30 Goal status: MET (Discontinued 07/29/2023 due to clinic no longer having access to this measure).   2.  Pt will decrease worst pain as reported on NPRS by at least 3 points in order to demonstrate clinically significant reduction in pain. Baseline: 4/10 (07/01/2022): up to 10/10  (11/19/2022); up to 4-5/10 when burning two days ago, 2.5/10 when excluding the burning (12/06/2022); 3-4/10 in the last 2 weeks (01/20/2023); Pain is 1.5/10 over the past 2 weeks (08/12/2023); up to 3/10 over the last two weeks (11/04/2023);  Goal status: on-going  3.  Pt will demonstrate plank time of or more to demonstrate age-predicted match of core strength needed for job duties Baseline: 10sec (07/01/2022): 24 seconds (11/19/2022); 23 seconds (12/26/2022); 37 seconds (01/20/2023); 35 seconds (04/01/2023); 29 seconds (07/29/2023); 35 seconds (11/04/2023);  Goal status: In-progress  4.  Pt will demonstrate bering sorenson test of 34sec or more to demonstrate clinically significant increase in spinal extensor strength needed for job duties Baseline: 20sec (07/01/2022); 58 seconds (11/19/2022); deferred(04/01/23); 54 seconds (07/29/2023);  Goal status: MET  5.  Patient will be independent with long  term HEP program for self-management of symptoms. Baseline: HEP to be reviewed and updated as needed at visit 7 as appropriate (11/19/22); she has has been participating 2x a week plus PT sessions + what  is left over after PT session (12/26/2022); has not been doing since cholecystectomy on 06/30/2023 (07/29/2023); she is doing them 4x a week (11/04/2023);  Goal status: MET, now regressed (07/29/2023); progressing again (11/04/2023);   6.  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  Baseline: 40# (07/29/23); 45# (11/04/2023);  Goal status: in progress  7.  Patient will demonstrate improvement in Patient Specific Functional Scale (PSFS) of equal or greater than 8/10 points to reflect clinically significant improvement in patient's most valued functional activities.. Baseline: 4/10 (07/29/23); 6.3/10 (11/04/2023);  Goal status: pin progress  PLAN:  PT FREQUENCY: 1-2x/week  PT DURATION: 12 weeks  PLANNED INTERVENTIONS: Therapeutic exercises, Therapeutic activity, Neuromuscular re-education, Balance training, Gait training, Patient/Family education, Self Care, Joint mobilization, Stair training, Dry Needling, Electrical stimulation, Spinal mobilization, Cryotherapy, Moist heat, Manual therapy, and Re-evaluation.  PLAN FOR NEXT SESSION:  Update HEP as appropriate, progressive core/LE/functional strengthening/motor control  exercises.    Camie SAUNDERS. Juli, PT, DPT 12/03/23, 9:23 PM  [facility] [address] [place], [place] [address] P: [phone] I F: [phone]

## 2023-12-03 ENCOUNTER — Encounter: Payer: Self-pay | Admitting: Physical Therapy

## 2023-12-10 ENCOUNTER — Encounter: Payer: Self-pay | Admitting: Physical Therapy

## 2023-12-10 ENCOUNTER — Ambulatory Visit: Admitting: Physical Therapy

## 2023-12-10 DIAGNOSIS — M62838 Other muscle spasm: Secondary | ICD-10-CM

## 2023-12-10 DIAGNOSIS — M5459 Other low back pain: Secondary | ICD-10-CM

## 2023-12-10 DIAGNOSIS — M792 Neuralgia and neuritis, unspecified: Secondary | ICD-10-CM

## 2023-12-10 NOTE — Therapy (Signed)
 OUTPATIENT PHYSICAL THERAPY TREATMENT  Patient Name: Theresa Ortiz MRN: [id number] DOB:1990-05-07, 33 y.o., female Today's Date: 12/10/2023  END OF SESSION:    PT End of Session - 12/10/23 1735     Visit Number 54    Number of Visits 74    Date for PT Re-Evaluation 01/27/24    Authorization Type [place] [employer] P[employer] reporting period from 11/04/2023    Progress Note Due on Visit 50    PT Start Time 1735    PT Stop Time 1830    PT Time Calculation (min) 55 min    Activity Tolerance Patient tolerated treatment well    Behavior During Therapy [facility] for tasks assessed/performed                Past Medical History:  Diagnosis Date   Acute meniscal tear of knee    left knee   Allergy    Anxiety    Asthma    Atrial fibrillation (HCC)    slight   COVID-19 04/2020   Depression    GERD (gastroesophageal reflux disease)    Ovarian cyst    PVC (premature ventricular contraction)    Scoliosis    Shingles    Tachycardia    Past Surgical History:  Procedure Laterality Date   CHOLECYSTECTOMY N/A 07/01/2023   Procedure: LAPAROSCOPIC CHOLECYSTECTOMY;  Surgeon: Ebbie Cough, MD;  Location: [facility] OR;  Service: General;  Laterality: N/A;  GENERAL & TAP BLOCK   KNEE ARTHROSCOPY WITH LATERAL MENISECTOMY Left 09/01/2012   Procedure: LEFT KNEE ARTHROSCOPY WITH LATERAL MENISCECTOMY ;  Surgeon: Tanda DELENA Heading, MD;  Location: [facility];  Service: Orthopedics;  Laterality: Left;   MENISCUS REPAIR Right    RIGHT INDEX  FINGER TENDON REPAIR  NOV 2013   Patient Active Problem List   Diagnosis Date Noted   Elevated liver enzymes 07/21/2023   Administrative encounter 07/21/2023   Helicobacter pylori ab+ 07/21/2023   Leukocytosis 07/21/2023   Microcytosis 07/21/2023   BMI 40.0-44.9, adult (HCC) 07/21/2023   Overactive bladder 07/21/2023   Personal history of COVID-19 07/21/2023   Asthma 04/03/2023   Gastro-esophageal reflux disease without esophagitis 01/22/2019    Palpitation 07/13/2018   Atypical chest pain 07/13/2018   Family history of heart disease 07/13/2018   Shortness of breath 07/13/2018   Sinus tachycardia 07/13/2018   Moderate recurrent major depression (HCC) 05/25/2018   Tobacco user 07/29/2014   Mild persistent asthma, uncomplicated 07/04/2014   Impaired fasting glucose 11/18/2012   Acute lateral meniscus tear of left knee 09/01/2012   Attention deficit hyperactivity disorder, predominantly inattentive type 05/03/2011    PCP: Tisovec, Charlie ORN, MD  REFERRING PROVIDER: Delon Gins, MD  REFERRING DIAG: 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 Pt reports she has some pain at the mid calf that is the size of packing tape that feels like a cramp. She reports she has tension at L low back and glute. LBP/glute pain aggravated by lifting/moving patients, and bending forward. Has had L sided sciatic symptoms 1x/week when lifting something heavy down post LLE. She hs previously had pain with prolonged walking, but that has  subsided over the past year or so. Has had successful PT in the past with [facility], which she thinks is very helpful. Pt works full time as an Engineer, structural, enjoys traveling and walking her dog. Comorbid conditions as listed above. No falls in past 6 months. Pt denies N/V, B&B changes, unexplained weight fluctuation, saddle paresthesia, fever, night sweats, or unrelenting night pain at this time. Patient denies hx of cancer, stroke, seizures, diabetes, unexplained weight loss, unexplained changes in bowel or bladder problems, unexplained stumbling or dropping things, osteoporosis, and  spinal surgery.  SUBJECTIVE:                                                                                                                                                                                           SUBJECTIVE STATEMENT:   Patient states she was really really sore the night after last PT session and the next day. She states her back still feels better but her L hip (glute) is still really screaming (tender). She has an achy feeling across her sacrum and she has old man back where she is really stiff when she gets up. Her foot pain and leg pain came back 30 min after last PT session but it was not as bad. Her HEP have been going fine. She has been able to do them without modifications because it wasn't that bad (especially side bird dogs with the left glute pain).     PAIN: NPRS: 3/10 pain over scacrum, 5/10 left glute, some pain (1/10) down the left lateral thigh to the knee.   Precautions: none  PATIENT GOALS: Get the cramp out of my calf, to eleviate whatever pissed it off this time out of the leg   OBJECTIVE  NEUROLOGIC TESTING DTR Quads: R 1+, L 2+ Achilles seated: R 1+, L 2+ Prone achilles: B  2+ Hamstrings: B 0  SLUMP Flexion: fairly equal end range tension bilaterally, affected by head position, no effect on concordant left glute pain  Extension: fairly equal end range tension bilaterally, not affected by head position. Left not worse with distraction. R worse with distraction.   SLR  From supine: L more restricted than right, both sensitive to foot position From 90/90: similar restriction but left sightly more.    TODAY'S TREATMENT  Neuromuscular Re-education: a technique or exercise performed with the goal of improving the level of communication between the body and the brain, such as for balance, motor control, muscle activation patterns, coordination, desensitization, quality of muscle contraction, proprioception, and/or kinesthetic sense needed for  successful and safe completion of functional activities.   Neuro Testing to assess nerve irritation (  see above)  Manual therapy: to reduce pain and tissue tension, improve range of motion, neuromodulation, in order to promote improved ability to complete functional activities. PRONE STM to deep left hip external rotators including contract-release into end range IR Palpation of OI bilaterally (tender but equal bilaterally)  Therapeutic exercise: therapeutic exercises that incorporate ONE parameter at one or more areas of the body to centralize symptoms, develop strength and endurance, range of motion, and flexibility required for successful completion of functional activities.  Education and trial of self STM to deep hip rotators with and without AROM (pin and stretch) in dependent sitting, sitting with hook legs and hands propping up trunk, supine with various leg positions using LAX ball (and trial of other ball sizes including mini LAX ball). Education on how to perform contract-relax with pressure at deep hip flexors with partner.   Sidelying reverse clam shell L 1x15 with 5 second hold with RTB looped around ankles.  Added to HEP   Pt required multimodal cuing for proper technique and to facilitate improved neuromuscular control, strength, range of motion, and functional ability resulting in improved performance and form.   PATIENT EDUCATION:  Education details: Exercise purpose/form. Self management techniques. Education on diagnosis, prognosis, POC, anatomy and physiology of current condition. Education on HEP including handout. Person educated: Patient Education method: Medical illustrator Education comprehension: verbalized understanding and returned demonstration, needs further education.   HOME EXERCISE PROGRAM: Access Code: [email or web address] URL: [email or web address] Date: 12/10/2023 Prepared by: Camie Cleverly  Exercises - Bird Dog  - 1 x daily - 2 sets - 5  reps - 10 second hold - Standard Plank  - 1 x daily - 3 reps - 40 seconds hold - Side Plank with Clam  - 1 x daily - 3 sets - 12 reps - Sidelying Reverse Clamshell with Resistance  - 1-2 x daily - 1-2 sets - 15 reps - 5 seconds hold  ASSESSMENT:  CLINICAL IMPRESSION:   Patient with persistent deep left glute pain unaffected by nerve tension tests. She has very similar nerve tension in sciatic nerve distribution bilaterally, mostly at end range which can be normal for a healthy adult. Pt with difficulty relaxing for reflex testing, but demonstrated no deficit on the left compared to the right (they were equal or right was more diminished). Her nerves did not move as well with extended slump (but only had increased pain with traction on the right). Rest of session focused on decreasing tension and pain in the deep hip flexors that appeared to be contributing more to her pain in her left glute than the nerve itself. She got decreased pain from 5/10 to 3/10 in the left glute by end of session and updated HEP to help her ease tension here with LAX independently or with the help of her fiance. She did have the sensation of back feeling weak and unstable after all of the moving around the table, suggesting clinical instability there (as she has had before). Plan to continue with strengthening and strategies to decrease muscle tension in the left deep hip next session. Patient would benefit from continued management of limiting condition by skilled physical therapist to address remaining impairments and functional limitations to work towards stated goals and return to PLOF or maximal functional independence.   OBJECTIVE IMPAIRMENTS: Abnormal gait, decreased activity tolerance, decreased balance, decreased endurance, decreased knowledge of condition, decreased mobility, difficulty walking, decreased ROM, decreased strength, increased fascial restrictions, impaired perceived functional ability, increased muscle  spasms, impaired  flexibility, improper body mechanics, postural dysfunction, obesity, and pain.   ACTIVITY LIMITATIONS: carrying, lifting, bending, sitting, standing, squatting, stairs, transfers, hygiene/grooming, locomotion level, and caring for others  PARTICIPATION LIMITATIONS: meal prep, cleaning, laundry, driving, shopping, community activity, occupation, yard work, and  working, walking, prolonged standing, bending, lifting, yardwork, housework, sleeping, traveling, prolonged sitting, going to [employer], anything that requires repetitive bending or lifting   PERSONAL FACTORS: Fitness, Past/current experiences, Time since onset of injury/illness/exacerbation, and 1-2 comorbidities: lateral meniscus tear of left knee; Palpitation; Atypical chest pain; Family history of heart disease; Shortness of breath; and Sinus tachycardia on their problem list. past medical history of Acute meniscal tear of knee, Allergy, Anxiety, Asthma, Atrial fibrillation (HCC), COVID-19 (04/2020), Depression, Ovarian cyst, PVC (premature ventricular contraction), Scoliosis, Shingles, and Tachycardia. past surgical history that includes RIGHT INDEX  FINGER TENDON REPAIR (NOV 2013); Knee arthroscopy with lateral menisectomy (Left, 09/01/2012); and Meniscus repair (Right) are also affecting patient's functional outcome.   REHAB POTENTIAL: Good  CLINICAL DECISION MAKING: Evolving/moderate complexity  EVALUATION COMPLEXITY: Moderate  GOALS: Goals reviewed with patient? Yes  SHORT TERM GOALS: Target date: 08/07/22. Target date updated to 12/04/2022 on 11/19/2022.   Pt will be independent with initial HEP in order to improve strength and balance in order to decrease fall risk and improve function at home and work. Baseline:HEP given; patient not participating (11/19/2022); participating regularly (12/02/2022);  Goal status: MET  LONG TERM GOALS: Target date: 08/26/22. Target date updated to 02/11/2023 for all unmet goals on  11/19/2022. Target date updated to 04/14/2023 for all unmet goals on 01/20/2023. Target date updated to 10/21/2023 or all unmet goals on 07/29/2023. Target date updated to 01/27/2024 for all unmet goals on 11/04/2023.   Patient will demonstrate improved FOTO by equal or greater than 10 by visit #17 to demonstrate improvement in overall condition and self-reported functional ability.  Baseline: initial baseline 55 (07/01/2022); 60 at visit #17 (12/26/2022); 66 at visit #30 Goal status: MET (Discontinued 07/29/2023 due to clinic no longer having access to this measure).   2.  Pt will decrease worst pain as reported on NPRS by at least 3 points in order to demonstrate clinically significant reduction in pain. Baseline: 4/10 (07/01/2022): up to 10/10  (11/19/2022); up to 4-5/10 when burning two days ago, 2.5/10 when excluding the burning (12/06/2022); 3-4/10 in the last 2 weeks (01/20/2023); Pain is 1.5/10 over the past 2 weeks (08/12/2023); up to 3/10 over the last two weeks (11/04/2023);  Goal status: on-going  3.  Pt will demonstrate plank time of or more to demonstrate age-predicted match of core strength needed for job duties Baseline: 10sec (07/01/2022): 24 seconds (11/19/2022); 23 seconds (12/26/2022); 37 seconds (01/20/2023); 35 seconds (04/01/2023); 29 seconds (07/29/2023); 35 seconds (11/04/2023);  Goal status: In-progress  4.  Pt will demonstrate bering sorenson test of 34sec or more to demonstrate clinically significant increase in spinal extensor strength needed for job duties Baseline: 20sec (07/01/2022); 58 seconds (11/19/2022); deferred(04/01/23); 54 seconds (07/29/2023);  Goal status: MET  5.  Patient will be independent with long term HEP program for self-management of symptoms. Baseline: HEP to be reviewed and updated as needed at visit 7 as appropriate (11/19/22); she has has been participating 2x a week plus PT sessions + what  is left over after PT session (12/26/2022); has not been doing since cholecystectomy on  06/30/2023 (07/29/2023); she is doing them 4x a week (11/04/2023);  Goal status: MET, now regressed (07/29/2023); progressing again (11/04/2023);   6.  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  Baseline: 40# (07/29/23); 45# (11/04/2023);  Goal status: in progress  7.  Patient will demonstrate improvement in Patient Specific Functional Scale (PSFS) of equal or greater than 8/10 points to reflect clinically significant improvement in patient's most valued functional activities.. Baseline: 4/10 (07/29/23); 6.3/10 (11/04/2023);  Goal status: pin progress  PLAN:  PT FREQUENCY: 1-2x/week  PT DURATION: 12 weeks  PLANNED INTERVENTIONS: Therapeutic exercises, Therapeutic activity, Neuromuscular re-education, Balance training, Gait training, Patient/Family education, Self Care, Joint mobilization, Stair training, Dry Needling, Electrical stimulation, Spinal mobilization, Cryotherapy, Moist heat, Manual therapy, and Re-evaluation.  PLAN FOR NEXT SESSION:  Update HEP as appropriate, progressive core/LE/functional strengthening/motor control  exercises.    Camie SAUNDERS. Juli, PT, DPT 12/10/23, 6:54 PM  [facility] [address] [place], [place] [address] P: [phone] I F: [phone]

## 2023-12-11 ENCOUNTER — Other Ambulatory Visit: Payer: Self-pay

## 2023-12-11 MED ORDER — NEOMYCIN-POLYMYXIN-DEXAMETH 3.5-10000-0.1 OP OINT
TOPICAL_OINTMENT | OPHTHALMIC | 0 refills | Status: AC
  Filled 2023-12-11: qty 3.5, 7d supply, fill #0

## 2023-12-23 ENCOUNTER — Ambulatory Visit: Admitting: Physical Therapy

## 2023-12-23 ENCOUNTER — Encounter: Payer: Self-pay | Admitting: Physical Therapy

## 2023-12-23 DIAGNOSIS — M62838 Other muscle spasm: Secondary | ICD-10-CM

## 2023-12-23 DIAGNOSIS — M5459 Other low back pain: Secondary | ICD-10-CM

## 2023-12-23 DIAGNOSIS — M792 Neuralgia and neuritis, unspecified: Secondary | ICD-10-CM

## 2023-12-23 NOTE — Therapy (Signed)
 OUTPATIENT PHYSICAL THERAPY TREATMENT  Patient Name: Theresa Ortiz MRN: [id number] DOB:February 28, 1991, 33 y.o., female Today's Date: 12/23/2023  END OF SESSION:    PT End of Session - 12/23/23 1830     Visit Number 55    Number of Visits 74    Date for PT Re-Evaluation 01/27/24    Authorization Type [place] [employer] PPO reporting period from 11/04/2023    Progress Note Due on Visit 50    PT Start Time 1818    PT Stop Time 1858    PT Time Calculation (min) 40 min    Activity Tolerance Patient tolerated treatment well    Behavior During Therapy [facility] for tasks assessed/performed                 Past Medical History:  Diagnosis Date   Acute meniscal tear of knee    left knee   Allergy    Anxiety    Asthma    Atrial fibrillation (HCC)    slight   COVID-19 04/2020   Depression    GERD (gastroesophageal reflux disease)    Ovarian cyst    PVC (premature ventricular contraction)    Scoliosis    Shingles    Tachycardia    Past Surgical History:  Procedure Laterality Date   CHOLECYSTECTOMY N/A 07/01/2023   Procedure: LAPAROSCOPIC CHOLECYSTECTOMY;  Surgeon: Ebbie Cough, MD;  Location: [facility] OR;  Service: General;  Laterality: N/A;  GENERAL & TAP BLOCK   KNEE ARTHROSCOPY WITH LATERAL MENISECTOMY Left 09/01/2012   Procedure: LEFT KNEE ARTHROSCOPY WITH LATERAL MENISCECTOMY ;  Surgeon: Tanda DELENA Heading, MD;  Location: [facility];  Service: Orthopedics;  Laterality: Left;   MENISCUS REPAIR Right    RIGHT INDEX  FINGER TENDON REPAIR  NOV 2013   Patient Active Problem List   Diagnosis Date Noted   Elevated liver enzymes 07/21/2023   Administrative encounter 07/21/2023   Helicobacter pylori ab+ 07/21/2023   Leukocytosis 07/21/2023   Microcytosis 07/21/2023   BMI 40.0-44.9, adult (HCC) 07/21/2023   Overactive bladder 07/21/2023   Personal history of COVID-19 07/21/2023   Asthma 04/03/2023   Gastro-esophageal reflux disease without esophagitis 01/22/2019    Palpitation 07/13/2018   Atypical chest pain 07/13/2018   Family history of heart disease 07/13/2018   Shortness of breath 07/13/2018   Sinus tachycardia 07/13/2018   Moderate recurrent major depression (HCC) 05/25/2018   Tobacco user 07/29/2014   Mild persistent asthma, uncomplicated 07/04/2014   Impaired fasting glucose 11/18/2012   Acute lateral meniscus tear of left knee 09/01/2012   Attention deficit hyperactivity disorder, predominantly inattentive type 05/03/2011    PCP: Tisovec, Charlie ORN, MD  REFERRING PROVIDER: Delon Gins, MD  REFERRING DIAG: 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 Pt reports she has some pain at the mid calf that is the size of packing tape that feels like a cramp. She reports she has tension at L low back and glute. LBP/glute pain aggravated by lifting/moving patients, and bending forward. Has had L sided sciatic symptoms 1x/week when lifting something heavy down post LLE. She hs previously had pain with prolonged walking, but that  has subsided over the past year or so. Has had successful PT in the past with [facility], which she thinks is very helpful. Pt works full time as an Engineer, structural, enjoys traveling and walking her dog. Comorbid conditions as listed above. No falls in past 6 months. Pt denies N/V, B&B changes, unexplained weight fluctuation, saddle paresthesia, fever, night sweats, or unrelenting night pain at this time. Patient denies hx of cancer, stroke, seizures, diabetes, unexplained weight loss, unexplained changes in bowel or bladder problems, unexplained stumbling or dropping things, osteoporosis, and  spinal surgery.  SUBJECTIVE:                                                                                                                                                                                           SUBJECTIVE STATEMENT:   Patient states things are going okay-ish. She was at the beach last week. She states her left hip got the whole tired sensation when walking to/from the beach and it really slowed her down. She has been using the ball a lot on her left glute. Her right ankle has been bothering her more since there was a lot of barefoot and unsupported walking. The ball has been helping her left deep hip rotators location, which seems to have reduced pain. She states her exercises have gone by the wayside during her trip to the beach.     PAIN: NPRS: 3/10 right ankle, tired sensation and 0.5/10 tightness at L glute med.    Precautions: none  PATIENT GOALS: Get the cramp out of my calf, to eleviate whatever pissed it off this time out of the leg   OBJECTIVE   TODAY'S TREATMENT  Therapeutic exercise: therapeutic exercises that incorporate ONE parameter at one or more areas of the body to centralize symptoms, develop strength and endurance, range of motion, and flexibility required for successful completion of functional activities.  Sidelying reverse clam shell 3x15 each side with RTB looped around ankles  Added to HEP  Quadruped bird dog (alternating shoulder flexion/contralateral hip extension with core muscles braced)  3x10 each side with 1 second hold with 5#AW on ankles and 2#AW on arms.   Plank 25 seconds elbows to feet (limited by R ankle pain) 1 minute elbows to knees Attempted elbows to ankles with ankles propped on foam roll (too hard to control) 1x1 min elbows to knees (max challenge)   Pt required multimodal cuing for proper technique and to facilitate improved neuromuscular control, strength, range of motion, and functional ability resulting in  improved performance and form.   PATIENT EDUCATION:  Education details: Exercise purpose/form. Self management techniques. Education on  diagnosis, prognosis, POC, anatomy and physiology of current condition. Education on HEP including handout. Person educated: Patient Education method: Medical illustrator Education comprehension: verbalized understanding and returned demonstration, needs further education.   HOME EXERCISE PROGRAM: Access Code: [email or web address] URL: [email or web address] Date: 12/10/2023 Prepared by: Camie Cleverly  Exercises - Bird Dog  - 1 x daily - 2 sets - 5 reps - 10 second hold - Standard Plank  - 1 x daily - 3 reps - 40 seconds hold - Side Plank with Clam  - 1 x daily - 3 sets - 12 reps - Sidelying Reverse Clamshell with Resistance  - 1-2 x daily - 1-2 sets - 15 reps - 5 seconds hold  ASSESSMENT:  CLINICAL IMPRESSION:   Patient tolerated treatment well with significant fatigue by end of session. Also has some increased irritation in left leg and across low back. Patient limited by limited muscular endurance. Patient would benefit from continued management of limiting condition by skilled physical therapist to address remaining impairments and functional limitations to work towards stated goals and return to PLOF or maximal functional independence.   OBJECTIVE IMPAIRMENTS: Abnormal gait, decreased activity tolerance, decreased balance, decreased endurance, decreased knowledge of condition, decreased mobility, difficulty walking, decreased ROM, decreased strength, increased fascial restrictions, impaired perceived functional ability, increased muscle spasms, impaired flexibility, improper body mechanics, postural dysfunction, obesity, and pain.   ACTIVITY LIMITATIONS: carrying, lifting, bending, sitting, standing, squatting, stairs, transfers, hygiene/grooming, locomotion level, and caring for others  PARTICIPATION LIMITATIONS: meal prep, cleaning,  laundry, driving, shopping, community activity, occupation, yard work, and  working, walking, prolonged standing, bending, lifting, yardwork, housework, sleeping, traveling, prolonged sitting, going to [employer], anything that requires repetitive bending or lifting   PERSONAL FACTORS: Fitness, Past/current experiences, Time since onset of injury/illness/exacerbation, and 1-2 comorbidities: lateral meniscus tear of left knee; Palpitation; Atypical chest pain; Family history of heart disease; Shortness of breath; and Sinus tachycardia on their problem list. past medical history of Acute meniscal tear of knee, Allergy, Anxiety, Asthma, Atrial fibrillation (HCC), COVID-19 (04/2020), Depression, Ovarian cyst, PVC (premature ventricular contraction), Scoliosis, Shingles, and Tachycardia. past surgical history that includes RIGHT INDEX  FINGER TENDON REPAIR (NOV 2013); Knee arthroscopy with lateral menisectomy (Left, 09/01/2012); and Meniscus repair (Right) are also affecting patient's functional outcome.   REHAB POTENTIAL: Good  CLINICAL DECISION MAKING: Evolving/moderate complexity  EVALUATION COMPLEXITY: Moderate  GOALS: Goals reviewed with patient? Yes  SHORT TERM GOALS: Target date: 08/07/22. Target date updated to 12/04/2022 on 11/19/2022.   Pt will be independent with initial HEP in order to improve strength and balance in order to decrease fall risk and improve function at home and work. Baseline:HEP given; patient not participating (11/19/2022); participating regularly (12/02/2022);  Goal status: MET  LONG TERM GOALS: Target date: 08/26/22. Target date updated to 02/11/2023 for all unmet goals on 11/19/2022. Target date updated to 04/14/2023 for all unmet goals on 01/20/2023. Target date updated to 10/21/2023 or all unmet goals on 07/29/2023. Target date updated to 01/27/2024 for all unmet goals on 11/04/2023.   Patient will demonstrate improved FOTO by equal or greater than 10 by visit #17 to demonstrate  improvement in overall condition and self-reported functional ability.  Baseline: initial baseline 55 (07/01/2022); 60 at visit #17 (12/26/2022); 66 at visit #30 Goal status: MET (Discontinued 07/29/2023 due to clinic no longer having access to this measure).   2.  Pt will decrease worst pain as reported on NPRS by at least 3 points in order to demonstrate clinically  significant reduction in pain. Baseline: 4/10 (07/01/2022): up to 10/10  (11/19/2022); up to 4-5/10 when burning two days ago, 2.5/10 when excluding the burning (12/06/2022); 3-4/10 in the last 2 weeks (01/20/2023); Pain is 1.5/10 over the past 2 weeks (08/12/2023); up to 3/10 over the last two weeks (11/04/2023);  Goal status: on-going  3.  Pt will demonstrate plank time of or more to demonstrate age-predicted match of core strength needed for job duties Baseline: 10sec (07/01/2022): 24 seconds (11/19/2022); 23 seconds (12/26/2022); 37 seconds (01/20/2023); 35 seconds (04/01/2023); 29 seconds (07/29/2023); 35 seconds (11/04/2023);  Goal status: In-progress  4.  Pt will demonstrate bering sorenson test of 34sec or more to demonstrate clinically significant increase in spinal extensor strength needed for job duties Baseline: 20sec (07/01/2022); 58 seconds (11/19/2022); deferred(04/01/23); 54 seconds (07/29/2023);  Goal status: MET  5.  Patient will be independent with long term HEP program for self-management of symptoms. Baseline: HEP to be reviewed and updated as needed at visit 7 as appropriate (11/19/22); she has has been participating 2x a week plus PT sessions + what  is left over after PT session (12/26/2022); has not been doing since cholecystectomy on 06/30/2023 (07/29/2023); she is doing them 4x a week (11/04/2023);  Goal status: MET, now regressed (07/29/2023); progressing again (11/04/2023);   6.  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  Baseline: 40#  (07/29/23); 45# (11/04/2023);  Goal status: in progress  7.  Patient will demonstrate improvement in Patient Specific Functional Scale (PSFS) of equal or greater than 8/10 points to reflect clinically significant improvement in patient's most valued functional activities.. Baseline: 4/10 (07/29/23); 6.3/10 (11/04/2023);  Goal status: pin progress  PLAN:  PT FREQUENCY: 1-2x/week  PT DURATION: 12 weeks  PLANNED INTERVENTIONS: Therapeutic exercises, Therapeutic activity, Neuromuscular re-education, Balance training, Gait training, Patient/Family education, Self Care, Joint mobilization, Stair training, Dry Needling, Electrical stimulation, Spinal mobilization, Cryotherapy, Moist heat, Manual therapy, and Re-evaluation.  PLAN FOR NEXT SESSION:  Update HEP as appropriate, progressive core/LE/functional strengthening/motor control  exercises.    Camie SAUNDERS. Juli, PT, DPT 12/23/23, 7:01 PM  [facility] [address] [place], [place] [address] P: [phone] I F: [phone]

## 2023-12-26 ENCOUNTER — Telehealth: Payer: Self-pay | Admitting: Podiatry

## 2023-12-26 NOTE — Telephone Encounter (Signed)
 Pt. Wanted to know if you can send her to get a MRI she stated that she has met her deductible and that her symptoms are getting worse and she is in a lot of pain

## 2023-12-31 ENCOUNTER — Ambulatory Visit: Attending: Physical Medicine & Rehabilitation | Admitting: Physical Therapy

## 2023-12-31 ENCOUNTER — Encounter: Payer: Self-pay | Admitting: Lab

## 2023-12-31 ENCOUNTER — Encounter: Payer: Self-pay | Admitting: Physical Therapy

## 2023-12-31 DIAGNOSIS — M5459 Other low back pain: Secondary | ICD-10-CM | POA: Insufficient documentation

## 2023-12-31 DIAGNOSIS — M792 Neuralgia and neuritis, unspecified: Secondary | ICD-10-CM | POA: Diagnosis not present

## 2023-12-31 DIAGNOSIS — M62838 Other muscle spasm: Secondary | ICD-10-CM | POA: Insufficient documentation

## 2023-12-31 NOTE — Therapy (Signed)
 OUTPATIENT PHYSICAL THERAPY TREATMENT  Patient Name: Theresa Ortiz MRN: [id number] DOB:May 28, 1990, 33 y.o., female Today's Date: 12/31/2023  END OF SESSION:    PT End of Session - 12/31/23 2011     Visit Number 56    Number of Visits 74    Date for PT Re-Evaluation 01/27/24    Authorization Type [place] [employer] PPO reporting period from 11/04/2023    Progress Note Due on Visit 50    PT Start Time 1822    PT Stop Time 1905    PT Time Calculation (min) 43 min    Activity Tolerance --    Behavior During Therapy [facility] for tasks assessed/performed             Past Medical History:  Diagnosis Date   Acute meniscal tear of knee    left knee   Allergy    Anxiety    Asthma    Atrial fibrillation (HCC)    slight   COVID-19 04/2020   Depression    GERD (gastroesophageal reflux disease)    Ovarian cyst    PVC (premature ventricular contraction)    Scoliosis    Shingles    Tachycardia    Past Surgical History:  Procedure Laterality Date   CHOLECYSTECTOMY N/A 07/01/2023   Procedure: LAPAROSCOPIC CHOLECYSTECTOMY;  Surgeon: Ebbie Cough, MD;  Location: [facility] OR;  Service: General;  Laterality: N/A;  GENERAL & TAP BLOCK   KNEE ARTHROSCOPY WITH LATERAL MENISECTOMY Left 09/01/2012   Procedure: LEFT KNEE ARTHROSCOPY WITH LATERAL MENISCECTOMY ;  Surgeon: Tanda DELENA Heading, MD;  Location: [facility];  Service: Orthopedics;  Laterality: Left;   MENISCUS REPAIR Right    RIGHT INDEX  FINGER TENDON REPAIR  NOV 2013   Patient Active Problem List   Diagnosis Date Noted   Elevated liver enzymes 07/21/2023   Administrative encounter 07/21/2023   Helicobacter pylori ab+ 07/21/2023   Leukocytosis 07/21/2023   Microcytosis 07/21/2023   BMI 40.0-44.9, adult (HCC) 07/21/2023   Overactive bladder 07/21/2023   Personal history of COVID-19 07/21/2023   Asthma 04/03/2023   Gastro-esophageal reflux disease without esophagitis 01/22/2019   Palpitation 07/13/2018   Atypical  chest pain 07/13/2018   Family history of heart disease 07/13/2018   Shortness of breath 07/13/2018   Sinus tachycardia 07/13/2018   Moderate recurrent major depression (HCC) 05/25/2018   Tobacco user 07/29/2014   Mild persistent asthma, uncomplicated 07/04/2014   Impaired fasting glucose 11/18/2012   Acute lateral meniscus tear of left knee 09/01/2012   Attention deficit hyperactivity disorder, predominantly inattentive type 05/03/2011    PCP: Tisovec, Charlie ORN, MD  REFERRING PROVIDER: Delon Gins, MD  REFERRING DIAG: 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 Pt reports she has some pain at the mid calf that is the size of packing tape that feels like a cramp. She reports she has tension at L low back and glute. LBP/glute pain aggravated by lifting/moving patients, and bending forward. Has had L sided sciatic symptoms 1x/week when lifting something heavy down post LLE. She hs previously had pain with prolonged walking, but that has subsided over the past year or  so. Has had successful PT in the past with [facility], which she thinks is very helpful. Pt works full time as an Engineer, structural, enjoys traveling and walking her dog. Comorbid conditions as listed above. No falls in past 6 months. Pt denies N/V, B&B changes, unexplained weight fluctuation, saddle paresthesia, fever, night sweats, or unrelenting night pain at this time. Patient denies hx of cancer, stroke, seizures, diabetes, unexplained weight loss, unexplained changes in bowel or bladder problems, unexplained stumbling or dropping things, osteoporosis, and spinal surgery.  SUBJECTIVE:                                                                                                                                                                                            SUBJECTIVE STATEMENT:   Patient states her low back has been more sore. She had about 15 min of symptoms to foot down the left leg after moving a patient yesterday.     PAIN: NPRS: 2/10 across low back and half way into the posterior thigh on the left.    Precautions: none  PATIENT GOALS: Get the cramp out of my calf, to eleviate whatever pissed it off this time out of the leg   OBJECTIVE   TODAY'S TREATMENT  Neuromuscular Re-education: a technique or exercise performed with the goal of improving the level of communication between the body and the brain, such as for balance, motor control, muscle activation patterns, coordination, desensitization, quality of muscle contraction, proprioception, and/or kinesthetic sense needed for successful and safe completion of functional activities.  Quadruped multifidi hip hikes with one knee on folded towel 3x5 each side with 5 second hold Cuing for TrA and pelvic floor contraction during motion  Patient requiring heavy cuing and giving up early each time she could not perform for full 5 seconds with many breaks as she found exercise very difficult. Also holding breath.  Found she had improved success when protracting scapula to improve stability there Added to HEP   Therapeutic exercise: therapeutic exercises that incorporate ONE parameter at one or more areas of the body to centralize symptoms, develop strength and endurance, range of motion, and flexibility required for successful completion of functional activities. Hooklying self lumbar traction with heels of hands in hip crease Learned how to perform with mild success with yoga blocks to extend arm length Added to HEP to use as preferred  Standing self lumbar traction over counter Several reps with short holds  and education on how to use this to alleviate symptoms.   Pt required multimodal cuing for proper technique and to facilitate improved neuromuscular control, strength, range of motion, and functional  ability resulting in improved performance and form.   PATIENT EDUCATION:  Education details: Exercise purpose/form. Self management techniques. Education on diagnosis, prognosis, POC, anatomy and physiology of current condition. Education on HEP including handout. Person educated: Patient Education method: Medical illustrator Education comprehension: verbalized understanding and returned demonstration, needs further education.   HOME EXERCISE PROGRAM: Access Code: [email or web address] URL: [email or web address] Date: 12/31/2023 Prepared by: Camie Cleverly  Exercises - Quadruped Hip Hike on Foam  - 1 x daily - 3 sets - 5 reps - 5 seconds hold - 5 seconds time to lower - Standard Plank  - 1 x daily - 3 reps - 40 seconds hold - Side Plank with Clam  - 1 x daily - 3 sets - 12 reps - Sidelying Reverse Clamshell with Resistance  - 1-2 x daily - 1-2 sets - 15 reps - 5 seconds hold - Hooklying Lumbar Traction  - 10 reps - 10 second hold - 10 seconds rest  - Standing Lumbar Traction with Chair  - 10 reps - 10 seconds hold - 10 seconds rest   ASSESSMENT:  CLINICAL IMPRESSION:   Session uncovered significant deficits in motor control and stability when hip hike exericse was refined to no longer allow a hip shift, resulting in need for much better recruitment and coordination of deep core and multifidi muscles. Patient struggled with this new exercise but likely really needs it to help with her recover. Patient had some pain in the back of the left knee during session and had increased pain down the back of the left leg by end of session (after traction). Hip hike exercise replaced bird dog in HEP and pt advised to use traction as an alleviatory way if it helps. Patient would benefit from  continued management of limiting condition by skilled physical therapist to address remaining impairments and functional limitations to work towards stated goals and return to PLOF or maximal functional independence.    OBJECTIVE IMPAIRMENTS: Abnormal gait, decreased activity tolerance, decreased balance, decreased endurance, decreased knowledge of condition, decreased mobility, difficulty walking, decreased ROM, decreased strength, increased fascial restrictions, impaired perceived functional ability, increased muscle spasms, impaired flexibility, improper body mechanics, postural dysfunction, obesity, and pain.   ACTIVITY LIMITATIONS: carrying, lifting, bending, sitting, standing, squatting, stairs, transfers, hygiene/grooming, locomotion level, and caring for others  PARTICIPATION LIMITATIONS: meal prep, cleaning, laundry, driving, shopping, community activity, occupation, yard work, and  working, walking, prolonged standing, bending, lifting, yardwork, housework, sleeping, traveling, prolonged sitting, going to [employer], anything that requires repetitive bending or lifting   PERSONAL FACTORS: Fitness, Past/current experiences, Time since onset of injury/illness/exacerbation, and 1-2 comorbidities: lateral meniscus tear of left knee; Palpitation; Atypical chest pain; Family history of heart disease; Shortness of breath; and Sinus tachycardia on their problem list. past medical history of Acute meniscal tear of knee, Allergy, Anxiety, Asthma, Atrial fibrillation (HCC), COVID-19 (04/2020), Depression, Ovarian cyst, PVC (premature ventricular contraction), Scoliosis, Shingles, and Tachycardia. past surgical history that includes RIGHT INDEX  FINGER TENDON REPAIR (NOV 2013); Knee arthroscopy with lateral menisectomy (Left, 09/01/2012); and Meniscus repair (Right) are also affecting patient's functional outcome.   REHAB POTENTIAL: Good  CLINICAL DECISION MAKING: Evolving/moderate complexity  EVALUATION  COMPLEXITY: Moderate  GOALS: Goals reviewed with patient? Yes  SHORT TERM GOALS: Target date: 08/07/22. Target date updated to 12/04/2022 on 11/19/2022.   Pt will be independent with initial HEP in order to improve strength and balance in order to decrease fall risk and improve function at home and work. Baseline:HEP  given; patient not participating (11/19/2022); participating regularly (12/02/2022);  Goal status: MET  LONG TERM GOALS: Target date: 08/26/22. Target date updated to 02/11/2023 for all unmet goals on 11/19/2022. Target date updated to 04/14/2023 for all unmet goals on 01/20/2023. Target date updated to 10/21/2023 or all unmet goals on 07/29/2023. Target date updated to 01/27/2024 for all unmet goals on 11/04/2023.   Patient will demonstrate improved FOTO by equal or greater than 10 by visit #17 to demonstrate improvement in overall condition and self-reported functional ability.  Baseline: initial baseline 55 (07/01/2022); 60 at visit #17 (12/26/2022); 66 at visit #30 Goal status: MET (Discontinued 07/29/2023 due to clinic no longer having access to this measure).   2.  Pt will decrease worst pain as reported on NPRS by at least 3 points in order to demonstrate clinically significant reduction in pain. Baseline: 4/10 (07/01/2022): up to 10/10  (11/19/2022); up to 4-5/10 when burning two days ago, 2.5/10 when excluding the burning (12/06/2022); 3-4/10 in the last 2 weeks (01/20/2023); Pain is 1.5/10 over the past 2 weeks (08/12/2023); up to 3/10 over the last two weeks (11/04/2023);  Goal status: on-going  3.  Pt will demonstrate plank time of or more to demonstrate age-predicted match of core strength needed for job duties Baseline: 10sec (07/01/2022): 24 seconds (11/19/2022); 23 seconds (12/26/2022); 37 seconds (01/20/2023); 35 seconds (04/01/2023); 29 seconds (07/29/2023); 35 seconds (11/04/2023);  Goal status: In-progress  4.  Pt will demonstrate bering sorenson test of 34sec or more to demonstrate clinically  significant increase in spinal extensor strength needed for job duties Baseline: 20sec (07/01/2022); 58 seconds (11/19/2022); deferred(04/01/23); 54 seconds (07/29/2023);  Goal status: MET  5.  Patient will be independent with long term HEP program for self-management of symptoms. Baseline: HEP to be reviewed and updated as needed at visit 7 as appropriate (11/19/22); she has has been participating 2x a week plus PT sessions + what  is left over after PT session (12/26/2022); has not been doing since cholecystectomy on 06/30/2023 (07/29/2023); she is doing them 4x a week (11/04/2023);  Goal status: MET, now regressed (07/29/2023); progressing again (11/04/2023);   6.  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  Baseline: 40# (07/29/23); 45# (11/04/2023);  Goal status: in progress  7.  Patient will demonstrate improvement in Patient Specific Functional Scale (PSFS) of equal or greater than 8/10 points to reflect clinically significant improvement in patient's most valued functional activities.. Baseline: 4/10 (07/29/23); 6.3/10 (11/04/2023);  Goal status: pin progress  PLAN:  PT FREQUENCY: 1-2x/week  PT DURATION: 12 weeks  PLANNED INTERVENTIONS: Therapeutic exercises, Therapeutic activity, Neuromuscular re-education, Balance training, Gait training, Patient/Family education, Self Care, Joint mobilization, Stair training, Dry Needling, Electrical stimulation, Spinal mobilization, Cryotherapy, Moist heat, Manual therapy, and Re-evaluation.  PLAN FOR NEXT SESSION:  Update HEP as appropriate, progressive core/LE/functional strengthening/motor control  exercises.    Camie SAUNDERS. Juli, PT, DPT 12/31/23, 8:19 PM  [facility] [address] [place], [place] [address] P: [phone] I F: [phone]

## 2024-01-01 NOTE — Telephone Encounter (Signed)
Left detailed message to schedule appointment

## 2024-01-02 DIAGNOSIS — R748 Abnormal levels of other serum enzymes: Secondary | ICD-10-CM | POA: Diagnosis not present

## 2024-01-02 DIAGNOSIS — N3281 Overactive bladder: Secondary | ICD-10-CM | POA: Diagnosis not present

## 2024-01-02 DIAGNOSIS — R7301 Impaired fasting glucose: Secondary | ICD-10-CM | POA: Diagnosis not present

## 2024-01-02 DIAGNOSIS — F900 Attention-deficit hyperactivity disorder, predominantly inattentive type: Secondary | ICD-10-CM | POA: Diagnosis not present

## 2024-01-02 DIAGNOSIS — F17200 Nicotine dependence, unspecified, uncomplicated: Secondary | ICD-10-CM | POA: Diagnosis not present

## 2024-01-02 DIAGNOSIS — K219 Gastro-esophageal reflux disease without esophagitis: Secondary | ICD-10-CM | POA: Diagnosis not present

## 2024-01-02 DIAGNOSIS — J4530 Mild persistent asthma, uncomplicated: Secondary | ICD-10-CM | POA: Diagnosis not present

## 2024-01-02 DIAGNOSIS — F331 Major depressive disorder, recurrent, moderate: Secondary | ICD-10-CM | POA: Diagnosis not present

## 2024-01-02 DIAGNOSIS — R768 Other specified abnormal immunological findings in serum: Secondary | ICD-10-CM | POA: Diagnosis not present

## 2024-01-02 DIAGNOSIS — R718 Other abnormality of red blood cells: Secondary | ICD-10-CM | POA: Diagnosis not present

## 2024-01-07 ENCOUNTER — Ambulatory Visit: Admitting: Physical Therapy

## 2024-01-07 ENCOUNTER — Encounter: Payer: Self-pay | Admitting: Physical Therapy

## 2024-01-07 DIAGNOSIS — M62838 Other muscle spasm: Secondary | ICD-10-CM | POA: Diagnosis not present

## 2024-01-07 DIAGNOSIS — M792 Neuralgia and neuritis, unspecified: Secondary | ICD-10-CM

## 2024-01-07 DIAGNOSIS — M5459 Other low back pain: Secondary | ICD-10-CM

## 2024-01-07 NOTE — Therapy (Signed)
 OUTPATIENT PHYSICAL THERAPY TREATMENT  Patient Name: Theresa Ortiz MRN: [id number] DOB:1991/01/31, 33 y.o., female Today's Date: 01/08/2024  END OF SESSION:    PT End of Session - 01/07/24 1957     Visit Number 57    Number of Visits 74    Date for PT Re-Evaluation 01/27/24    Authorization Type [place] [employer] PPO reporting period from 11/04/2023    Progress Note Due on Visit 50    PT Start Time 1733    PT Stop Time 1820    PT Time Calculation (min) 47 min    Activity Tolerance Patient tolerated treatment well    Behavior During Therapy [facility] for tasks assessed/performed              Past Medical History:  Diagnosis Date   Acute meniscal tear of knee    left knee   Allergy    Anxiety    Asthma    Atrial fibrillation (HCC)    slight   COVID-19 04/2020   Depression    GERD (gastroesophageal reflux disease)    Ovarian cyst    PVC (premature ventricular contraction)    Scoliosis    Shingles    Tachycardia    Past Surgical History:  Procedure Laterality Date   CHOLECYSTECTOMY N/A 07/01/2023   Procedure: LAPAROSCOPIC CHOLECYSTECTOMY;  Surgeon: Ebbie Cough, MD;  Location: [facility] OR;  Service: General;  Laterality: N/A;  GENERAL & TAP BLOCK   KNEE ARTHROSCOPY WITH LATERAL MENISECTOMY Left 09/01/2012   Procedure: LEFT KNEE ARTHROSCOPY WITH LATERAL MENISCECTOMY ;  Surgeon: Tanda DELENA Heading, MD;  Location: [facility];  Service: Orthopedics;  Laterality: Left;   MENISCUS REPAIR Right    RIGHT INDEX  FINGER TENDON REPAIR  NOV 2013   Patient Active Problem List   Diagnosis Date Noted   Elevated liver enzymes 07/21/2023   Administrative encounter 07/21/2023   Helicobacter pylori ab+ 07/21/2023   Leukocytosis 07/21/2023   Microcytosis 07/21/2023   BMI 40.0-44.9, adult (HCC) 07/21/2023   Overactive bladder 07/21/2023   Personal history of COVID-19 07/21/2023   Asthma 04/03/2023   Gastro-esophageal reflux disease without esophagitis 01/22/2019    Palpitation 07/13/2018   Atypical chest pain 07/13/2018   Family history of heart disease 07/13/2018   Shortness of breath 07/13/2018   Sinus tachycardia 07/13/2018   Moderate recurrent major depression (HCC) 05/25/2018   Tobacco user 07/29/2014   Mild persistent asthma, uncomplicated 07/04/2014   Impaired fasting glucose 11/18/2012   Acute lateral meniscus tear of left knee 09/01/2012   Attention deficit hyperactivity disorder, predominantly inattentive type 05/03/2011    PCP: Tisovec, Charlie ORN, MD  REFERRING PROVIDER: Delon Gins, MD  REFERRING DIAG: 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 Pt reports she has some pain at the mid calf that is the size of packing tape that feels like a cramp. She reports she has tension at L low back and glute. LBP/glute pain aggravated by lifting/moving patients, and bending forward. Has had L sided sciatic symptoms 1x/week when lifting something heavy down post LLE. She hs previously had pain with prolonged walking, but that has subsided over  the past year or so. Has had successful PT in the past with [facility], which she thinks is very helpful. Pt works full time as an Engineer, structural, enjoys traveling and walking her dog. Comorbid conditions as listed above. No falls in past 6 months. Pt denies N/V, B&B changes, unexplained weight fluctuation, saddle paresthesia, fever, night sweats, or unrelenting night pain at this time. Patient denies hx of cancer, stroke, seizures, diabetes, unexplained weight loss, unexplained changes in bowel or bladder problems, unexplained stumbling or dropping things, osteoporosis, and  spinal surgery.  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 She states her left SIJ region felt caught when she was doing stairs or sitting in a dependent position after being at a dirt bike race on Saturday. She felt more aggravated in her L SIJ after last PT session with increased ipsilateral pain when accepting weight with each step. This reminds her of the pain she had suddenly when she did the splits a few months ago. The SIJ pain is gradually improving. She has been doing her HEP with the hip hike and feels she is still bad at it.   PAIN: NPRS: 3/10 L SIJ region, 2/10 across low back and down L leg to heel  Precautions: none  PATIENT GOALS: Get the cramp out of my calf, to eleviate whatever pissed it off this time out of the leg   OBJECTIVE   TODAY'S TREATMENT  Manual therapy: to reduce pain and tissue tension, improve range of motion, neuromodulation, in order to promote improved ability to complete functional activities. HOOKLYING Intermittent manual lumbar traction with belt around back of knees 5 min x10 seconds on/off  Neuromuscular Re-education: a technique or exercise performed with the goal of improving the level of communication between the body and the brain, such as for balance, motor control, muscle activation patterns, coordination, desensitization, quality of muscle contraction, proprioception, and/or kinesthetic sense needed for successful and safe completion of functional activities.  Quadruped multifidi hip hikes with one knee on folded towel 1x5 each side with 5 second hold Patient still not breathing well Removed from HEP  Prone (with folded pillow  + 2nd pillow under abdomen/hips) alternating hip extension with slight PPT, focused on stabilizing lumbar spine with TrA contraction and pelvic floor contraction, cuing  thought of hip extension and moving into hip extension only as far as possible without pain provocation and while keeping lumbopelvic control locked in.  Reps to learn technique with step by step and multimodal cuing, then  1x10 each side with 10 second hold Added to HEP  Hooklying PPT with lat pull over, focusing on feeling with low back arches and correcting it 3x10 with AROM and with 15#DB held by both hands Discontinued DB due to L glute pain/shooting intermittently Added to HEP  Self-Care/Home Management Training: to educate patient in self care including ADL training, meal preparation, compensatory training, safety procedures/instructions, use of assistive technology devices or adaptive equipment.  Review of lock clams for HEP Review of self traction options  Pt required multimodal cuing for proper technique and to facilitate improved neuromuscular control, strength, range of motion, and functional ability resulting in improved performance and form.   PATIENT EDUCATION:  Education details: Exercise purpose/form. Self management techniques. Education on diagnosis, prognosis, POC, anatomy and physiology of current condition. Education on HEP including handout. Person educated: Patient Education method: Medical illustrator Education comprehension: verbalized understanding and returned demonstration, needs further education.   HOME EXERCISE PROGRAM: Access Code: [email or web address] URL: [email or web address] Date: 01/07/2024 Prepared by: Camie Cleverly  Exercises - Prone Hip Extension with Pillow Under Abdomen  - 1 x daily - 3 sets - 5 reps - 10 seconds hold - Supine Shoulder Flexion with Dowel  - 1 x daily - 3 sets - 10 reps - Hooklying Lumbar Traction  - 10 reps - 10 second hold - 10 seconds rest  -  Standing Lumbar Traction with Chair  - 10 reps - 10 seconds hold - 10 seconds rest  HOME EXERCISE PROGRAM [[email or web address]]  Lock Clams -  Repeat 10 Repetitions, Hold 5 Seconds, Complete 3 Sets, Perform 1 Times a Day   ASSESSMENT:  CLINICAL IMPRESSION:   Session with worse pain over the left SIJ since last PT session and worse left lower leg symptoms since moving a patient on Monday. Pateint responded to manual traction with decrased leg pain but increased pain near the L SIJ. Regressed lumbopelvic control exercises due to ongoing difficulty with breathing and strength/control with the quadruped exercise. Pateint was able to perform prone exercise better with less discomfort. She still has difficulty controlling rib position and abdominal brace, which is likely contributing to segmental instability while performing functional activities. HEP updated to better reflect focus of improving motor control. Patient reported feeling better in her leg and SIJ region at end of session. Plan to continue focus on motor control until she shows mastery. Patient would benefit from continued management of limiting condition by skilled physical therapist to address remaining impairments and functional limitations to work towards stated goals and return to PLOF or maximal functional independence.     OBJECTIVE IMPAIRMENTS: Abnormal gait, decreased activity tolerance, decreased balance, decreased endurance, decreased knowledge of condition, decreased mobility, difficulty walking, decreased ROM, decreased strength, increased fascial restrictions, impaired perceived functional ability, increased muscle spasms, impaired flexibility, improper body mechanics, postural dysfunction, obesity, and pain.   ACTIVITY LIMITATIONS: carrying, lifting, bending, sitting, standing, squatting, stairs, transfers, hygiene/grooming, locomotion level, and caring for others  PARTICIPATION LIMITATIONS: meal prep, cleaning, laundry, driving,  shopping, community activity, occupation, yard work, and  working, walking, prolonged standing, bending, lifting, yardwork, housework, sleeping, traveling, prolonged sitting, going to [employer], anything that requires repetitive bending or lifting   PERSONAL FACTORS: Fitness, Past/current experiences, Time since onset of injury/illness/exacerbation, and 1-2  comorbidities: lateral meniscus tear of left knee; Palpitation; Atypical chest pain; Family history of heart disease; Shortness of breath; and Sinus tachycardia on their problem list. past medical history of Acute meniscal tear of knee, Allergy, Anxiety, Asthma, Atrial fibrillation (HCC), COVID-19 (04/2020), Depression, Ovarian cyst, PVC (premature ventricular contraction), Scoliosis, Shingles, and Tachycardia. past surgical history that includes RIGHT INDEX  FINGER TENDON REPAIR (NOV 2013); Knee arthroscopy with lateral menisectomy (Left, 09/01/2012); and Meniscus repair (Right) are also affecting patient's functional outcome.   REHAB POTENTIAL: Good  CLINICAL DECISION MAKING: Evolving/moderate complexity  EVALUATION COMPLEXITY: Moderate  GOALS: Goals reviewed with patient? Yes  SHORT TERM GOALS: Target date: 08/07/22. Target date updated to 12/04/2022 on 11/19/2022.   Pt will be independent with initial HEP in order to improve strength and balance in order to decrease fall risk and improve function at home and work. Baseline:HEP given; patient not participating (11/19/2022); participating regularly (12/02/2022);  Goal status: MET  LONG TERM GOALS: Target date: 08/26/22. Target date updated to 02/11/2023 for all unmet goals on 11/19/2022. Target date updated to 04/14/2023 for all unmet goals on 01/20/2023. Target date updated to 10/21/2023 or all unmet goals on 07/29/2023. Target date updated to 01/27/2024 for all unmet goals on 11/04/2023.   Patient will demonstrate improved FOTO by equal or greater than 10 by visit #17 to demonstrate improvement in overall  condition and self-reported functional ability.  Baseline: initial baseline 55 (07/01/2022); 60 at visit #17 (12/26/2022); 66 at visit #30 Goal status: MET (Discontinued 07/29/2023 due to clinic no longer having access to this measure).   2.  Pt will decrease worst pain as reported on NPRS by at least 3 points in order to demonstrate clinically significant reduction in pain. Baseline: 4/10 (07/01/2022): up to 10/10  (11/19/2022); up to 4-5/10 when burning two days ago, 2.5/10 when excluding the burning (12/06/2022); 3-4/10 in the last 2 weeks (01/20/2023); Pain is 1.5/10 over the past 2 weeks (08/12/2023); up to 3/10 over the last two weeks (11/04/2023);  Goal status: on-going  3.  Pt will demonstrate plank time of or more to demonstrate age-predicted match of core strength needed for job duties Baseline: 10sec (07/01/2022): 24 seconds (11/19/2022); 23 seconds (12/26/2022); 37 seconds (01/20/2023); 35 seconds (04/01/2023); 29 seconds (07/29/2023); 35 seconds (11/04/2023);  Goal status: In-progress  4.  Pt will demonstrate bering sorenson test of 34sec or more to demonstrate clinically significant increase in spinal extensor strength needed for job duties Baseline: 20sec (07/01/2022); 58 seconds (11/19/2022); deferred(04/01/23); 54 seconds (07/29/2023);  Goal status: MET  5.  Patient will be independent with long term HEP program for self-management of symptoms. Baseline: HEP to be reviewed and updated as needed at visit 7 as appropriate (11/19/22); she has has been participating 2x a week plus PT sessions + what  is left over after PT session (12/26/2022); has not been doing since cholecystectomy on 06/30/2023 (07/29/2023); she is doing them 4x a week (11/04/2023);  Goal status: MET, now regressed (07/29/2023); progressing again (11/04/2023);   6.  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  Baseline: 40# (07/29/23); 45#  (11/04/2023);  Goal status: in progress  7.  Patient will demonstrate improvement in Patient Specific Functional Scale (PSFS) of equal or greater than 8/10 points to reflect clinically significant improvement in patient's most valued functional activities.. Baseline: 4/10 (07/29/23); 6.3/10 (11/04/2023);  Goal status: pin progress  PLAN:  PT  FREQUENCY: 1-2x/week  PT DURATION: 12 weeks  PLANNED INTERVENTIONS: Therapeutic exercises, Therapeutic activity, Neuromuscular re-education, Balance training, Gait training, Patient/Family education, Self Care, Joint mobilization, Stair training, Dry Needling, Electrical stimulation, Spinal mobilization, Cryotherapy, Moist heat, Manual therapy, and Re-evaluation.  PLAN FOR NEXT SESSION:  Update HEP as appropriate, progressive core/LE/functional strengthening/motor control  exercises.    Camie SAUNDERS. Juli, PT, DPT 01/08/24, 9:26 AM  [facility] [address] [place], [place] [address] P: [phone] I F: [phone]

## 2024-01-14 ENCOUNTER — Ambulatory Visit: Admitting: Physical Therapy

## 2024-01-15 ENCOUNTER — Telehealth: Payer: Self-pay

## 2024-01-15 NOTE — Telephone Encounter (Signed)
 Patient is out of town and states that she will return it on next Friday 9/26. Please advise

## 2024-01-15 NOTE — Telephone Encounter (Signed)
**Note De-Identified Jervis Trapani Obfuscation** 3rd Attempt I called the pt but got no answer so I left a message on her VM (ok per DPR) advising her that [name] does not require a PA for the WatchPAT One-HST that [name], PA-c ordered at her office visit on 02/25/2023 with her, so it is ok for her to proceed with the WatchPAT One-HST using the pin #:[id number].  I did advise in the message that if she has decided not to do the WatchPAT One Home Sleep Study that she needs to return the device to the office in its original unopened box or we will charge her $100 for it.  I did leave my name and the office phone in the message si she can call me back if she has any questions or concerns.

## 2024-01-20 ENCOUNTER — Encounter: Admitting: Physical Therapy

## 2024-01-27 ENCOUNTER — Ambulatory Visit: Admitting: Physical Therapy

## 2024-01-27 ENCOUNTER — Encounter: Payer: Self-pay | Admitting: Physical Therapy

## 2024-01-27 DIAGNOSIS — M62838 Other muscle spasm: Secondary | ICD-10-CM

## 2024-01-27 DIAGNOSIS — M792 Neuralgia and neuritis, unspecified: Secondary | ICD-10-CM | POA: Diagnosis not present

## 2024-01-27 DIAGNOSIS — M5459 Other low back pain: Secondary | ICD-10-CM

## 2024-01-27 NOTE — Therapy (Signed)
 OUTPATIENT PHYSICAL THERAPY TREATMENT  Patient Name: Theresa Ortiz MRN: [id number] DOB:[birth year], 33 y.o., female Today's Date: 01/27/2024  END OF SESSION:    PT End of Session - 01/27/24 1701     Visit Number 58    Number of Visits 74    Date for Recertification  01/27/24    Authorization Type [place] [employer] PPO reporting period from 11/04/2023    Progress Note Due on Visit 50    PT Start Time 1700    PT Stop Time 1740    PT Time Calculation (min) 40 min    Activity Tolerance Patient tolerated treatment well    Behavior During Therapy [facility] for tasks assessed/performed           Past Medical History:  Diagnosis Date   Acute meniscal tear of knee    left knee   Allergy    Anxiety    Asthma    Atrial fibrillation (HCC)    slight   COVID-19 04/2020   Depression    GERD (gastroesophageal reflux disease)    Ovarian cyst    PVC (premature ventricular contraction)    Scoliosis    Shingles    Tachycardia    Past Surgical History:  Procedure Laterality Date   CHOLECYSTECTOMY N/A 07/01/2023   Procedure: LAPAROSCOPIC CHOLECYSTECTOMY;  Surgeon: Ebbie Cough, MD;  Location: [facility] OR;  Service: General;  Laterality: N/A;  GENERAL & TAP BLOCK   KNEE ARTHROSCOPY WITH LATERAL MENISECTOMY Left 09/01/2012   Procedure: LEFT KNEE ARTHROSCOPY WITH LATERAL MENISCECTOMY ;  Surgeon: Tanda DELENA Heading, MD;  Location: [facility];  Service: Orthopedics;  Laterality: Left;   MENISCUS REPAIR Right    RIGHT INDEX  FINGER TENDON REPAIR  NOV 2013   Patient Active Problem List   Diagnosis Date Noted   Elevated liver enzymes 07/21/2023   Administrative encounter 07/21/2023   Helicobacter pylori ab+ 07/21/2023   Leukocytosis 07/21/2023   Microcytosis 07/21/2023   BMI 40.0-44.9, adult (HCC) 07/21/2023   Overactive bladder 07/21/2023   Personal history of COVID-19 07/21/2023   Asthma 04/03/2023   Gastro-esophageal reflux disease without esophagitis 01/22/2019    Palpitation 07/13/2018   Atypical chest pain 07/13/2018   Family history of heart disease 07/13/2018   Shortness of breath 07/13/2018   Sinus tachycardia 07/13/2018   Moderate recurrent major depression (HCC) 05/25/2018   Tobacco user 07/29/2014   Mild persistent asthma, uncomplicated 07/04/2014   Impaired fasting glucose 11/18/2012   Acute lateral meniscus tear of left knee 09/01/2012   Attention deficit hyperactivity disorder, predominantly inattentive type 05/03/2011    PCP: Tisovec, Charlie ORN, MD  REFERRING PROVIDER: Delon Gins, MD  REFERRING DIAG: 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 Pt reports she has some pain at the mid calf that is the size of packing tape that feels like a cramp. She reports she has tension at L low back and glute. LBP/glute pain aggravated by lifting/moving patients, and bending forward. Has had L sided sciatic symptoms 1x/week when lifting something heavy down post LLE. She hs previously had pain with prolonged walking, but that has subsided over the past year  or so. Has had successful PT in the past with [facility], which she thinks is very helpful. Pt works full time as an Engineer, structural, enjoys traveling and walking her dog. Comorbid conditions as listed above. No falls in past 6 months. Pt denies N/V, B&B changes, unexplained weight fluctuation, saddle paresthesia, fever, night sweats, or unrelenting night pain at this time. Patient denies hx of cancer, stroke, seizures, diabetes, unexplained weight loss, unexplained changes in bowel or bladder problems, unexplained stumbling or dropping things, osteoporosis, and  spinal surgery.  SUBJECTIVE:                                                                                                                                                                                           SUBJECTIVE STATEMENT:   Patient states she irritated her right side (low back and heel) when moving a patient on Thursday. This caused pain in the right heel for 12 hours. She is still having some of this right sided pain intermittently. All last week while sitting in a stool or sitting in general in the OR she was having a pinching in the left posterior glute. When she leans forward in sitting is when she first noticed it. She felt it in the L ASIS region last week when leaning forward in the chair. Now it feels deeper and she can feel it in the posterior glute instead of the anterior hip. That pain worsened into Friday where it was painful to lift L lefg up when in supine to roll over and this still hurts. She also felt heaviness in the left leg with posterior glute pain when walking into work.  She was on vacation at the beach the week before. Her body felt great while on vacation since she was not doing anything. She gets really really restless in the car, to the point where she feels like she will go crazy if she cannot straighten her legs. The prolonged sitting also bothers her. She did not do any HEP this last week. She lost her dog to heart failure and is trying to survive. She also had a head cold that she is now at the end of. Her left glute was irritated after last PT session.  PAIN: NPRS: 3/10 across low back with left worse than right.  Precautions: none  PATIENT GOALS: Get the cramp out of my calf, to eleviate whatever pissed it off this time out of the leg   OBJECTIVE   TODAY'S TREATMENT  Neuromuscular Re-education: a technique or exercise performed with the goal of improving the level of communication between the body and the brain, such as for  balance, motor control,  muscle activation patterns, coordination, desensitization, quality of muscle contraction, proprioception, and/or kinesthetic sense needed for successful and safe completion of functional activities.   Prone (with folded pillow under lumbar spine) alternating hip extension with slight PPT, focused on stabilizing lumbar spine with TrA contraction and pelvic floor contraction, cuing thought of hip extension and moving into hip extension only as far as possible without pain provocation and while keeping lumbopelvic control locked in.  1x4 each side with 10 second hold Struggling not to lose PPT  Half-hooklying PPT with isometric hip extension with ankle on table 2x5 each side with 10 second holds PT palpating both sides   Quadruped Alternating hip extension while keeping active PPT  1x5 each side with 10 second hold Heavy cuing for positioning   Half-hooklying PPT with isometric hip extension with ankle on short bolster 1x5 each side with 10 second holds Updated in HEP  Quadruped Alternating hip extension while keeping active PPT  1x5 each side with 10 second hold Less cuing for positioning  Updated in HEP  Pt required multimodal cuing for proper technique and to facilitate improved neuromuscular control, strength, range of motion, and functional ability resulting in improved performance and form.   PATIENT EDUCATION:  Education details: Exercise purpose/form. Self management techniques. Education on diagnosis, prognosis, POC, anatomy and physiology of current condition. Education on HEP including handout. Person educated: Patient Education method: Medical illustrator Education comprehension: verbalized understanding and returned demonstration, needs further education.   HOME EXERCISE PROGRAM: Access Code: [email or web address] URL: [email or web address] Date: 01/27/2024 Prepared by: Camie Cleverly  Exercises - Supine Shoulder Flexion with Dowel  - 1 x daily - 3 sets - 10  reps - Supine Single Leg Hip Extension Isometric Into Table  - 1 x daily - 2 sets - 5 reps - 5 seconds hold - Beginner Front Arm Support  - 1 x daily - 2 sets - 5 reps - 5 second hold hold - Lock Clams  - 1 x daily - 2 sets - 15 reps - 5 seconds hold - 90 seconds time practicing per set - Standing Lumbar Traction with Chair  - 10 reps - 10 seconds hold - 10 seconds rest   ASSESSMENT:  CLINICAL IMPRESSION:   Today's session continued focus on improving motor control around hip extension without lumbar extension. Mixed supine and quadruped positions to help with transferrence of skills. Pateint continues to be overwhelmed by cuing but demonstrates improvement in motor control by end of session. HEP updated to continue focus on this. Patient has not been doing HEP recently. Plan to re-assess progress next session, but breif discussion about this with patient today reveals she finds PT very helpful and belives it has lead to her ability to be more functional with less pian. Patient would benefit from continued management of limiting condition by skilled physical therapist to address remaining impairments and functional limitations to work towards stated goals and return to PLOF or maximal functional independence.    OBJECTIVE IMPAIRMENTS: Abnormal gait, decreased activity tolerance, decreased balance, decreased endurance, decreased knowledge of condition, decreased mobility, difficulty walking, decreased ROM, decreased strength, increased fascial restrictions, impaired perceived functional ability, increased muscle spasms, impaired flexibility, improper body mechanics, postural dysfunction, obesity, and pain.   ACTIVITY LIMITATIONS: carrying, lifting, bending, sitting, standing, squatting, stairs, transfers, hygiene/grooming, locomotion level, and caring for others  PARTICIPATION LIMITATIONS: meal prep, cleaning, laundry, driving, shopping, community activity, occupation, yard work, and  working,  walking, prolonged standing, bending, lifting,  yardwork, housework, sleeping, traveling, prolonged sitting, going to the beach, anything that requires repetitive bending or lifting   PERSONAL FACTORS: Fitness, Past/current experiences, Time since onset of injury/illness/exacerbation, and 1-2 comorbidities: lateral meniscus tear of left knee; Palpitation; Atypical chest pain; Family history of heart disease; Shortness of breath; and Sinus tachycardia on their problem list. past medical history of Acute meniscal tear of knee, Allergy, Anxiety, Asthma, Atrial fibrillation (HCC), COVID-19 (04/2020), Depression, Ovarian cyst, PVC (premature ventricular contraction), Scoliosis, Shingles, and Tachycardia. past surgical history that includes RIGHT INDEX  FINGER TENDON REPAIR (NOV 2013); Knee arthroscopy with lateral menisectomy (Left, 09/01/2012); and Meniscus repair (Right) are also affecting patient's functional outcome.   REHAB POTENTIAL: Good  CLINICAL DECISION MAKING: Evolving/moderate complexity  EVALUATION COMPLEXITY: Moderate  GOALS: Goals reviewed with patient? Yes  SHORT TERM GOALS: Target date: 08/07/22. Target date updated to 12/04/2022 on 11/19/2022.   Pt will be independent with initial HEP in order to improve strength and balance in order to decrease fall risk and improve function at home and work. Baseline:HEP given; patient not participating (11/19/2022); participating regularly (12/02/2022);  Goal status: MET  LONG TERM GOALS: Target date: 08/26/22. Target date updated to 02/11/2023 for all unmet goals on 11/19/2022. Target date updated to 04/14/2023 for all unmet goals on 01/20/2023. Target date updated to 10/21/2023 or all unmet goals on 07/29/2023. Target date updated to 01/27/2024 for all unmet goals on 11/04/2023.   Patient will demonstrate improved FOTO by equal or greater than 10 by visit #17 to demonstrate improvement in overall condition and self-reported functional ability.  Baseline: initial  baseline 55 (07/01/2022); 60 at visit #17 (12/26/2022); 66 at visit #30 Goal status: MET (Discontinued 07/29/2023 due to clinic no longer having access to this measure).   2.  Pt will decrease worst pain as reported on NPRS by at least 3 points in order to demonstrate clinically significant reduction in pain. Baseline: 4/10 (07/01/2022): up to 10/10  (11/19/2022); up to 4-5/10 when burning two days ago, 2.5/10 when excluding the burning (12/06/2022); 3-4/10 in the last 2 weeks (01/20/2023); Pain is 1.5/10 over the past 2 weeks (08/12/2023); up to 3/10 over the last two weeks (11/04/2023);  Goal status: on-going  3.  Pt will demonstrate plank time of or more to demonstrate age-predicted match of core strength needed for job duties Baseline: 10sec (07/01/2022): 24 seconds (11/19/2022); 23 seconds (12/26/2022); 37 seconds (01/20/2023); 35 seconds (04/01/2023); 29 seconds (07/29/2023); 35 seconds (11/04/2023);  Goal status: In-progress  4.  Pt will demonstrate bering sorenson test of 34sec or more to demonstrate clinically significant increase in spinal extensor strength needed for job duties Baseline: 20sec (07/01/2022); 58 seconds (11/19/2022); deferred(04/01/23); 54 seconds (07/29/2023);  Goal status: MET  5.  Patient will be independent with long term HEP program for self-management of symptoms. Baseline: HEP to be reviewed and updated as needed at visit 7 as appropriate (11/19/22); she has has been participating 2x a week plus PT sessions + what  is left over after PT session (12/26/2022); has not been doing since cholecystectomy on 06/30/2023 (07/29/2023); she is doing them 4x a week (11/04/2023);  Goal status: MET, now regressed (07/29/2023); progressing again (11/04/2023);   6.  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  Baseline: 40# (07/29/23); 45# (11/04/2023);  Goal status: in progress  7.  Patient will demonstrate  improvement in Patient Specific Functional Scale (PSFS) of  equal or greater than 8/10 points to reflect clinically significant improvement in patient's most valued functional activities.. Baseline: 4/10 (07/29/23); 6.3/10 (11/04/2023);  Goal status: pin progress  PLAN:  PT FREQUENCY: 1-2x/week  PT DURATION: 12 weeks  PLANNED INTERVENTIONS: Therapeutic exercises, Therapeutic activity, Neuromuscular re-education, Balance training, Gait training, Patient/Family education, Self Care, Joint mobilization, Stair training, Dry Needling, Electrical stimulation, Spinal mobilization, Cryotherapy, Moist heat, Manual therapy, and Re-evaluation.  PLAN FOR NEXT SESSION:  Update HEP as appropriate, progressive core/LE/functional strengthening/motor control  exercises.    Camie SAUNDERS. Juli, PT, DPT 01/27/24, 7:46 PM  [facility] [address] [place], [place] [address] P: [phone] I F: [phone]

## 2024-01-29 NOTE — Telephone Encounter (Signed)
**Note De-Identified Burleigh Brockmann Obfuscation** The pts device was left in the front office by the pts Mom, Philippe. The device was returned in its original unopened box. I have placed the device back in our stock, cancelled the Itamar order and unregistered the device from [employer].

## 2024-01-29 NOTE — Telephone Encounter (Signed)
 Patient returned staff call regarding sleep equipment.  Patient stated her mother will be dropping off the equipment today

## 2024-01-29 NOTE — Telephone Encounter (Signed)
**Note De-Identified Rosealie Reach Obfuscation** The pt called me back and stated that she has a lot going on right now and cannot do her Itamar-HST. She states that once things calm down for her she will request to do the home sleep test later.  She states that her Mom, Philippe is going to be returning the [facility] One-HST device back to the office.  I thanked her for letting us  know.

## 2024-02-02 ENCOUNTER — Ambulatory Visit: Admitting: Podiatry

## 2024-02-03 ENCOUNTER — Ambulatory Visit: Admitting: Physical Therapy

## 2024-02-09 ENCOUNTER — Other Ambulatory Visit: Payer: Self-pay

## 2024-02-09 MED ORDER — FLUOXETINE HCL 10 MG PO CAPS
10.0000 mg | ORAL_CAPSULE | Freq: Every day | ORAL | 3 refills | Status: AC
  Filled 2024-02-09: qty 90, 90d supply, fill #0
  Filled 2024-06-03: qty 90, 90d supply, fill #1

## 2024-02-09 MED ORDER — AMPHETAMINE-DEXTROAMPHET ER 20 MG PO CP24
20.0000 mg | ORAL_CAPSULE | Freq: Every day | ORAL | 0 refills | Status: DC
  Filled 2024-02-09: qty 30, 30d supply, fill #0

## 2024-02-10 ENCOUNTER — Ambulatory Visit: Admitting: Podiatry

## 2024-02-10 ENCOUNTER — Encounter: Admitting: Physical Therapy

## 2024-02-10 ENCOUNTER — Encounter: Payer: Self-pay | Admitting: Podiatry

## 2024-02-10 DIAGNOSIS — L719 Rosacea, unspecified: Secondary | ICD-10-CM | POA: Insufficient documentation

## 2024-02-10 DIAGNOSIS — S93491A Sprain of other ligament of right ankle, initial encounter: Secondary | ICD-10-CM

## 2024-02-10 DIAGNOSIS — M66869 Spontaneous rupture of other tendons, unspecified lower leg: Secondary | ICD-10-CM

## 2024-02-10 DIAGNOSIS — M2141 Flat foot [pes planus] (acquired), right foot: Secondary | ICD-10-CM | POA: Diagnosis not present

## 2024-02-10 DIAGNOSIS — H01009 Unspecified blepharitis unspecified eye, unspecified eyelid: Secondary | ICD-10-CM | POA: Insufficient documentation

## 2024-02-10 DIAGNOSIS — L83 Acanthosis nigricans: Secondary | ICD-10-CM | POA: Insufficient documentation

## 2024-02-10 NOTE — Progress Notes (Signed)
 She presents today for follow-up of her right ankle instability.  States that she has been wearing her orthotics and her tennis shoes the majority of the time.  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  States that she has pain right here as she points to the anterior talofibular ligament area as well as the tibialis anterior medially.  She states that she is had formal physical therapy for her back as well as her foot all to no avail.  She continues to wear her good shoes orthotics and brace when necessary.  She continues to elevate and ice when she comes home from work.  Anti-inflammatories as needed.  Objective: Vital signs are stable alert and oriented x 3.  Pulses are palpable.  She has pain on palpation of the tibialis anterior tendon with some fluctuance on palpation at the level of the ankle joint.  She has tenderness on dorsiflexion and inversion along the tendon and along the tendon sheath.  She also has no palpable anterior talofibular ligament calcaneofibular ligament is intact but is tender on palpation.  She also has some tenderness on palpation and functional limitation of the peroneal tendons.  Negative anterior drawer.  Assessment: Lateral ankle instability flexible pes planovalgus probable tear of the tear talofibular ligament.  Cannot rule out partial tear of the tibialis anterior tendon.  Plan: Discussed etiology pathology conservative surgical therapies at this point I recommend she continue to wear her orthotics and tennis shoes is much as possible continue to ice and elevate is much as possible and continue anti-inflammatories.  I do think an MRI would be necessary to completely evaluate this ankle.  This MRI will be for differential diagnosis and surgical consideration.

## 2024-02-18 ENCOUNTER — Ambulatory Visit: Attending: Physical Medicine & Rehabilitation | Admitting: Physical Therapy

## 2024-02-23 ENCOUNTER — Ambulatory Visit
Admission: RE | Admit: 2024-02-23 | Discharge: 2024-02-23 | Disposition: A | Discharge status: Home / Self Care | Source: Ambulatory Visit | Attending: Podiatry | Admitting: Podiatry

## 2024-02-23 DIAGNOSIS — M2141 Flat foot [pes planus] (acquired), right foot: Secondary | ICD-10-CM

## 2024-02-23 DIAGNOSIS — S93491A Sprain of other ligament of right ankle, initial encounter: Secondary | ICD-10-CM

## 2024-02-23 DIAGNOSIS — M76821 Posterior tibial tendinitis, right leg: Secondary | ICD-10-CM | POA: Diagnosis not present

## 2024-02-23 DIAGNOSIS — M66869 Spontaneous rupture of other tendons, unspecified lower leg: Secondary | ICD-10-CM

## 2024-02-24 ENCOUNTER — Encounter: Payer: Self-pay | Admitting: Physical Therapy

## 2024-02-24 ENCOUNTER — Ambulatory Visit: Attending: Physical Medicine & Rehabilitation | Admitting: Physical Therapy

## 2024-02-24 DIAGNOSIS — M792 Neuralgia and neuritis, unspecified: Secondary | ICD-10-CM | POA: Insufficient documentation

## 2024-02-24 DIAGNOSIS — M5459 Other low back pain: Secondary | ICD-10-CM | POA: Diagnosis not present

## 2024-02-24 DIAGNOSIS — M62838 Other muscle spasm: Secondary | ICD-10-CM | POA: Insufficient documentation

## 2024-02-24 NOTE — Therapy (Signed)
 OUTPATIENT PHYSICAL THERAPY TREATMENT / PROGRESS NOTE / RE-CERTIFICATION Dates of reporting from 11/04/2023 to 02/24/2024  Patient Name: Theresa Ortiz MRN: [id number] DOB:November 21, 1990, 33 y.o., female Today's Date: 02/24/2024  END OF SESSION:    PT End of Session - 02/24/24 1605     Visit Number 59    Number of Visits 74    Date for Recertification  05/18/24    Authorization Type [place] [employer] PPO reporting period from 11/04/2023    Progress Note Due on Visit 50    PT Start Time 1604    PT Stop Time 1648    PT Time Calculation (min) 44 min    Activity Tolerance Patient tolerated treatment well    Behavior During Therapy [facility] for tasks assessed/performed            Past Medical History:  Diagnosis Date   Acute meniscal tear of knee    left knee   Allergy    Anxiety    Asthma    Atrial fibrillation (HCC)    slight   COVID-19 04/2020   Depression    GERD (gastroesophageal reflux disease)    Ovarian cyst    PVC (premature ventricular contraction)    Scoliosis    Shingles    Tachycardia    Past Surgical History:  Procedure Laterality Date   CHOLECYSTECTOMY N/A 07/01/2023   Procedure: LAPAROSCOPIC CHOLECYSTECTOMY;  Surgeon: Ebbie Cough, MD;  Location: [facility] OR;  Service: General;  Laterality: N/A;  GENERAL & TAP BLOCK   KNEE ARTHROSCOPY WITH LATERAL MENISECTOMY Left 09/01/2012   Procedure: LEFT KNEE ARTHROSCOPY WITH LATERAL MENISCECTOMY ;  Surgeon: Tanda DELENA Heading, MD;  Location: [facility];  Service: Orthopedics;  Laterality: Left;   MENISCUS REPAIR Right    RIGHT INDEX  FINGER TENDON REPAIR  NOV 2013   Patient Active Problem List   Diagnosis Date Noted   Acne rosacea 02/10/2024   Acquired acanthosis nigricans 02/10/2024   Blepharitis 02/10/2024   Elevated liver enzymes 07/21/2023   Administrative encounter 07/21/2023   Helicobacter pylori ab+ 07/21/2023   Leukocytosis 07/21/2023   Microcytosis 07/21/2023   BMI 40.0-44.9, adult (HCC)  07/21/2023   Overactive bladder 07/21/2023   Personal history of COVID-19 07/21/2023   Asthma 04/03/2023   Gastro-esophageal reflux disease without esophagitis 01/22/2019   Palpitation 07/13/2018   Atypical chest pain 07/13/2018   Family history of heart disease 07/13/2018   Shortness of breath 07/13/2018   Sinus tachycardia 07/13/2018   Moderate recurrent major depression (HCC) 05/25/2018   Tobacco user 07/29/2014   Mild persistent asthma, uncomplicated 07/04/2014   Morbid obesity (HCC) 03/16/2013   Impaired fasting glucose 11/18/2012   Acute lateral meniscus tear of left knee 09/01/2012   Attention deficit hyperactivity disorder, predominantly inattentive type 05/03/2011    PCP: Tisovec, Charlie ORN, MD  REFERRING PROVIDER: Delon Gins, MD  REFERRING DIAG: 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 Pt reports she has some pain at the mid calf that is the size of packing tape that feels like a cramp. She reports she has tension at L low back and glute. LBP/glute pain aggravated by lifting/moving  patients, and bending forward. Has had L sided sciatic symptoms 1x/week when lifting something heavy down post LLE. She hs previously had pain with prolonged walking, but that has subsided over the past year or so. Has had successful PT in the past with [facility], which she thinks is very helpful. Pt works full time as an Engineer, structural, enjoys traveling and walking her dog. Comorbid conditions as listed above. No falls in past 6 months. Pt denies N/V, B&B changes, unexplained weight fluctuation, saddle paresthesia, fever, night sweats, or unrelenting night  pain at this time. Patient denies hx of cancer, stroke, seizures, diabetes, unexplained weight loss, unexplained changes in bowel or bladder problems, unexplained stumbling or dropping things, osteoporosis, and spinal surgery.  SUBJECTIVE:                                                                                                                                                                                           SUBJECTIVE STATEMENT:   Patient states she is here. She states with the two new exercises from last PT session, she had pain in her right foot sharp nerve pain in the low back and more prominent in the right side to the lower leg. She stopped doing those exercises and has stopped doing anything except stretching for relief. Her left SIJ region doesn't like sitting done. She thinks PT his helping her by helping keep the lows not as bad. She notices an increase in aggravation and nerve pain when there is a lapse in PT. The nerve pain came immediately after last PT session, and it took 2.5 weeks for that to get better.  PAIN: NPRS: 3/10 left glute when she sits and bends.   Precautions: none  PATIENT GOALS: Get the cramp out of my calf, to eleviate whatever pissed it off this time out of the leg   OBJECTIVE  1RM calculated from <10RM (last tested 09/30/2023) Knee extension at [facility], seat position 1 R: 58.7# L: 43.2# Hamstring curl at [facility], seat position 1 R: 46.7# L: 46.7#   SELF-REPORTED FUNCTION Patient Specific Functional Scale (PSFS)  Repetitively bending over (to do things like picking up sticks from the yard and clean her home): 5/10 Strenuous activity like lifting things: 3.5/10 Prolonged standing: 4/10 Average: 4.2/10   FUNCTIONAL/BALANCE TESTS: Plank: 34 seconds (aching in back towards the end) Biering-Sorenson test:  Purpose of test: assess lumbar extensor endurance Results: 43 seconds Analysis: patient scored lower than her previous score, but still  above her long term goal of 34 seconds, but is far below the age matched norm of 120 seconds for adult females.  Floor to waist  box lift: 45# max before increasing pain in left adductor regoin during the lift.   TODAY'S TREATMENT  Therapeutic activities: dynamic therapeutic activities incorporating MULTIPLE parameters or areas of the body designed to achieve improved functional performance.  Floor to waist lift testing (see above): 1 rep at each weight starting at 10# box and increasing by 5# each rep until at max comfortable weight (45#).   Therapeutic exercise: therapeutic exercises that incorporate ONE parameter at one or more areas of the body to centralize symptoms, develop strength and endurance, range of motion, and flexibility required for successful completion of functional activities.  Plank testing (see above)  Education on HEP including handout   Education on POC  Physical Performance Test or Measurement: a physical performance test or measurement with written report.  Biering-Sorenson test (see above)   Neuromuscular Re-education: a technique or exercise performed with the goal of improving the level of communication between the body and the brain, such as for balance, motor control, muscle activation patterns, coordination, desensitization, quality of muscle contraction, proprioception, and/or kinesthetic sense needed for successful and safe completion of functional activities.   Standing PPT against wall while practicing B shoulder flexion with focus on lumbopelvic motor control 1x10 after learning how to perform  Good control Added to HEP  Standing pallof press (multifidus press) with TrA/glute activation and slight PPT (as needed for improved core stability) 1x10 each side with 5 second holds with 10# cable Feels much more in left glute with anchor to the left Added to HEP  5 second eccentric squat in front of chair to tap chair and rise, focusing on holding trunk  neutral with abdominal and glute engagement 1x10 with 1 second descent Added to HEP   Pt required multimodal cuing for proper technique and to facilitate improved neuromuscular control, strength, range of motion, and functional ability resulting in improved performance and form.   PATIENT EDUCATION:  Education details: Exercise purpose/form. Self management techniques. Education on diagnosis, prognosis, POC, anatomy and physiology of current condition. Education on HEP including handout. Person educated: Patient Education method: Medical Illustrator Education comprehension: verbalized understanding and returned demonstration, needs further education.   HOME EXERCISE PROGRAM: Access Code: [email or web address] URL: [email or web address] Date: 02/24/2024 Prepared by: Camie Cleverly  Exercises - Posterior Pelvic Tilt at Wall Arms Overhead  - 1 x daily - 1 sets - 10 reps - Standing Anti-Rotation Press with Anchored Resistance  - 1 x daily - 2 sets - 10 reps - 5 seconds hold - Eccentric Squat  - 1 x daily - 2 sets - 10 reps - 5 seconds time to lower   ASSESSMENT:  CLINICAL IMPRESSION:   Patient has completed 59 physical therapy sessions since starting current episode of care on 07/01/2022. It has been 4 weeks since her last PT session after which she had new pain down the right leg for 2.5 weeks that caused her to stop her HEP for fear that it would make her condition worse. At last PT session she reported onset of right sided low back pain to her foot when moving a patient the previous Thursday. She was feeling better and was able to complete all testing to update goals. Unfortunately, she had not improved with her plank or floor to waist lift and decreased in her Biering-Sorenson test. Her highest pain in the last 3 weeks was higher than previously and her PSFS score was much lower, reflecting her fear of irritating her condition with functional activities that she has been  limited in  previously. Patient does appear to worsen when she is away from PT (such as these last 4 weeks) and with her more acute episode of right sided low back pain with radicular symptoms it is understandable that her progress towards goals has stalled. Patient has not yet maximized her rehab potential. Recommend she continue with PT with better participation in HEP or more frequent visits to help her rebuild her confidence and functional activity tolerance. Patient would benefit from continued management of limiting condition by skilled physical therapist to address remaining impairments and functional limitations to work towards stated goals and return to PLOF or maximal functional independence.   OBJECTIVE IMPAIRMENTS: Abnormal gait, decreased activity tolerance, decreased balance, decreased endurance, decreased knowledge of condition, decreased mobility, difficulty walking, decreased ROM, decreased strength, increased fascial restrictions, impaired perceived functional ability, increased muscle spasms, impaired flexibility, improper body mechanics, postural dysfunction, obesity, and pain.   ACTIVITY LIMITATIONS: carrying, lifting, bending, sitting, standing, squatting, stairs, transfers, hygiene/grooming, locomotion level, and caring for others  PARTICIPATION LIMITATIONS: meal prep, cleaning, laundry, driving, shopping, community activity, occupation, yard work, and  working, walking, prolonged standing, bending, lifting, yardwork, housework, sleeping, traveling, prolonged sitting, going to [employer], anything that requires repetitive bending or lifting   PERSONAL FACTORS: Fitness, Past/current experiences, Time since onset of injury/illness/exacerbation, and 1-2 comorbidities: lateral meniscus tear of left knee; Palpitation; Atypical chest pain; Family history of heart disease; Shortness of breath; and Sinus tachycardia on their problem list. past medical history of Acute meniscal tear of knee, Allergy,  Anxiety, Asthma, Atrial fibrillation (HCC), COVID-19 (04/2020), Depression, Ovarian cyst, PVC (premature ventricular contraction), Scoliosis, Shingles, and Tachycardia. past surgical history that includes RIGHT INDEX  FINGER TENDON REPAIR (NOV 2013); Knee arthroscopy with lateral menisectomy (Left, 09/01/2012); and Meniscus repair (Right) are also affecting patient's functional outcome.   REHAB POTENTIAL: Good  CLINICAL DECISION MAKING: Evolving/moderate complexity  EVALUATION COMPLEXITY: Moderate  GOALS: Goals reviewed with patient? Yes  SHORT TERM GOALS: Target date: 08/07/22. Target date updated to 12/04/2022 on 11/19/2022.   Pt will be independent with initial HEP in order to improve strength and balance in order to decrease fall risk and improve function at home and work. Baseline:HEP given; patient not participating (11/19/2022); participating regularly (12/02/2022);  Goal status: MET  LONG TERM GOALS: Target date: 08/26/22. Target date updated to 02/11/2023 for all unmet goals on 11/19/2022. Target date updated to 04/14/2023 for all unmet goals on 01/20/2023. Target date updated to 10/21/2023 or all unmet goals on 07/29/2023. Target date updated to 01/27/2024 for all unmet goals on 11/04/2023. Updated to 05/18/2024 for all unmet goals on 02/24/2024.    2.  Pt will decrease worst pain as reported on NPRS by at least 3 points in order to demonstrate clinically significant reduction in pain. Baseline: 4/10 (07/01/2022): up to 10/10  (11/19/2022); up to 4-5/10 when burning two days ago, 2.5/10 when excluding the burning (12/06/2022); 3-4/10 in the last 2 weeks (01/20/2023); Pain is 1.5/10 over the past 2 weeks (08/12/2023); up to 3/10 over the last two weeks (11/04/2023); up to 6/10 over the last 3 weeks (02/24/24);  Goal status: on-going  3.  Pt will demonstrate plank time of or more to demonstrate age-predicted match of core strength needed for job duties Baseline: 10sec (07/01/2022): 24 seconds (11/19/2022); 23  seconds (12/26/2022); 37 seconds (01/20/2023); 35 seconds (04/01/2023); 29 seconds (07/29/2023); 35 seconds (11/04/2023); 34 seconds (02/24/24);  Goal status: on-going  4.  Pt will demonstrate Biering-Sorenson test of  34sec or more to demonstrate clinically significant increase in spinal extensor strength needed for job duties Baseline: 20sec (07/01/2022); 58 seconds (11/19/2022); deferred(04/01/23); 54 seconds (07/29/2023); 43 seconds (02/24/24);  Goal status: MET  5.  Patient will be independent with long term HEP program for self-management of symptoms. Baseline: HEP to be reviewed and updated as needed at visit 7 as appropriate (11/19/22); she has has been participating 2x a week plus PT sessions + what  is left over after PT session (12/26/2022); has not been doing since cholecystectomy on 06/30/2023 (07/29/2023); she is doing them 4x a week (11/04/2023); only doing stretches (02/24/24); not participating out of fear of re-irritation (02/24/2024);  Goal status: ongoing  6.  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  Baseline: 40# (07/29/23); 45# (11/04/2023);  Goal status: in progress  7.  Patient will demonstrate improvement in Patient Specific Functional Scale (PSFS) of equal or greater than 8/10 points to reflect clinically significant improvement in patient's most valued functional activities.. Baseline: 4/10 (07/29/23); 6.3/10 (11/04/2023); 4.2/10 (02/24/2024);  Goal status: on-going  PLAN:  PT FREQUENCY: 1-2x/week  PT DURATION: 12 weeks  PLANNED INTERVENTIONS: Therapeutic exercises, Therapeutic activity, Neuromuscular re-education, Balance training, Gait training, Patient/Family education, Self Care, Joint mobilization, Stair training, Dry Needling, Electrical stimulation, Spinal mobilization, Cryotherapy, Moist heat, Manual therapy, and Re-evaluation.  PLAN FOR NEXT SESSION:  Keep HEP updated with  exercises that challenge pt and improve motor control for functional activity tolerance.    Camie SAUNDERS. Juli, PT, DPT 02/24/24, 5:19 PM  [facility] [address] [place], [place] [address] P: [phone] I F: [id number]

## 2024-02-27 ENCOUNTER — Ambulatory Visit: Payer: Self-pay | Admitting: Podiatry

## 2024-03-02 ENCOUNTER — Telehealth: Payer: Self-pay | Admitting: Podiatry

## 2024-03-02 NOTE — Telephone Encounter (Signed)
 Patient is scheduled for 04/08/24.

## 2024-03-04 ENCOUNTER — Other Ambulatory Visit (HOSPITAL_BASED_OUTPATIENT_CLINIC_OR_DEPARTMENT_OTHER): Payer: Self-pay

## 2024-03-04 DIAGNOSIS — D1801 Hemangioma of skin and subcutaneous tissue: Secondary | ICD-10-CM | POA: Diagnosis not present

## 2024-03-04 DIAGNOSIS — L910 Hypertrophic scar: Secondary | ICD-10-CM | POA: Diagnosis not present

## 2024-03-04 DIAGNOSIS — H019 Unspecified inflammation of eyelid: Secondary | ICD-10-CM | POA: Diagnosis not present

## 2024-03-04 DIAGNOSIS — L578 Other skin changes due to chronic exposure to nonionizing radiation: Secondary | ICD-10-CM | POA: Diagnosis not present

## 2024-03-04 DIAGNOSIS — L814 Other melanin hyperpigmentation: Secondary | ICD-10-CM | POA: Diagnosis not present

## 2024-03-04 DIAGNOSIS — D229 Melanocytic nevi, unspecified: Secondary | ICD-10-CM | POA: Diagnosis not present

## 2024-03-04 DIAGNOSIS — L821 Other seborrheic keratosis: Secondary | ICD-10-CM | POA: Diagnosis not present

## 2024-03-04 DIAGNOSIS — L309 Dermatitis, unspecified: Secondary | ICD-10-CM | POA: Diagnosis not present

## 2024-03-24 ENCOUNTER — Ambulatory Visit: Admitting: Physical Therapy

## 2024-03-30 ENCOUNTER — Ambulatory Visit: Attending: Physical Medicine & Rehabilitation | Admitting: Physical Therapy

## 2024-03-30 DIAGNOSIS — M792 Neuralgia and neuritis, unspecified: Secondary | ICD-10-CM | POA: Insufficient documentation

## 2024-03-30 DIAGNOSIS — M5459 Other low back pain: Secondary | ICD-10-CM | POA: Diagnosis present

## 2024-03-30 DIAGNOSIS — M62838 Other muscle spasm: Secondary | ICD-10-CM | POA: Diagnosis present

## 2024-03-30 NOTE — Therapy (Signed)
 OUTPATIENT PHYSICAL THERAPY TREATMENT / PROGRESS NOTE  Dates of reporting from 11/04/2023 to 03/30/2024   Patient Name: Theresa Ortiz MRN: [id number] DOB:08-25-1990, 33 y.o., female Today's Date: 03/30/2024  END OF SESSION:    PT End of Session - 03/30/24 1522     Visit Number 60    Number of Visits 74    Date for Recertification  05/18/24    Authorization Type [place] [employer] PPO reporting period from 11/04/2023    Progress Note Due on Visit 50    PT Start Time 1521    PT Stop Time 1607    PT Time Calculation (min) 46 min    Activity Tolerance Patient tolerated treatment well    Behavior During Therapy [facility] for tasks assessed/performed             Past Medical History:  Diagnosis Date   Acute meniscal tear of knee    left knee   Allergy    Anxiety    Asthma    Atrial fibrillation (HCC)    slight   COVID-19 04/2020   Depression    GERD (gastroesophageal reflux disease)    Ovarian cyst    PVC (premature ventricular contraction)    Scoliosis    Shingles    Tachycardia    Past Surgical History:  Procedure Laterality Date   CHOLECYSTECTOMY N/A 07/01/2023   Procedure: LAPAROSCOPIC CHOLECYSTECTOMY;  Surgeon: Ebbie Cough, MD;  Location: [facility] OR;  Service: General;  Laterality: N/A;  GENERAL & TAP BLOCK   KNEE ARTHROSCOPY WITH LATERAL MENISECTOMY Left 09/01/2012   Procedure: LEFT KNEE ARTHROSCOPY WITH LATERAL MENISCECTOMY ;  Surgeon: Tanda DELENA Heading, MD;  Location: [facility];  Service: Orthopedics;  Laterality: Left;   MENISCUS REPAIR Right    RIGHT INDEX  FINGER TENDON REPAIR  NOV 2013   Patient Active Problem List   Diagnosis Date Noted   Acne rosacea 02/10/2024   Acquired acanthosis nigricans 02/10/2024   Blepharitis 02/10/2024   Elevated liver enzymes 07/21/2023   Administrative encounter 07/21/2023   Helicobacter pylori ab+ 07/21/2023   Leukocytosis 07/21/2023   Microcytosis 07/21/2023   BMI 40.0-44.9, adult (HCC) 07/21/2023    Overactive bladder 07/21/2023   Personal history of COVID-19 07/21/2023   Asthma 04/03/2023   Gastro-esophageal reflux disease without esophagitis 01/22/2019   Palpitation 07/13/2018   Atypical chest pain 07/13/2018   Family history of heart disease 07/13/2018   Shortness of breath 07/13/2018   Sinus tachycardia 07/13/2018   Moderate recurrent major depression (HCC) 05/25/2018   Tobacco user 07/29/2014   Mild persistent asthma, uncomplicated 07/04/2014   Morbid obesity (HCC) 03/16/2013   Impaired fasting glucose 11/18/2012   Acute lateral meniscus tear of left knee 09/01/2012   Attention deficit hyperactivity disorder, predominantly inattentive type 05/03/2011    PCP: Tisovec, Charlie ORN, MD  REFERRING PROVIDER: Delon Gins, MD  REFERRING DIAG: 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 Pt reports she has some pain at the mid calf that is the size of packing tape that feels like a cramp. She reports she has tension at L low back and glute. LBP/glute pain aggravated by  lifting/moving patients, and bending forward. Has had L sided sciatic symptoms 1x/week when lifting something heavy down post LLE. She hs previously had pain with prolonged walking, but that has subsided over the past year or so. Has had successful PT in the past with [facility], which she thinks is very helpful. Pt works full time as an Engineer, structural, enjoys traveling and walking her dog. Comorbid conditions as listed above. No falls in past 6 months. Pt denies N/V, B&B changes, unexplained weight fluctuation, saddle paresthesia, fever, night sweats, or unrelenting night pain at this  time. Patient denies hx of cancer, stroke, seizures, diabetes, unexplained weight loss, unexplained changes in bowel or bladder problems, unexplained stumbling or dropping things, osteoporosis, and spinal surgery.  SUBJECTIVE:                                                                                                                                                                                           SUBJECTIVE STATEMENT:   Patient states her back has been okay. She has randomly gotten a nerve pain in the bottom left and bottom right occasionally. Most recently she was walking down the hall and she felt a zing on the left side. She has not had problems with weakness in her left leg. She has really been okay except she did notice about 2 days ago she is stiff. Since Thanksgiving she has not been doing her HEP but outside of that they have been going well. She has plans to get back into HEP. She states the only thing that is bothering her the most is going to the grocery store. Her back gets really fatigued, especially with the cart and she has to pack it strategically to make sure the heaviest load is in the back of the cart. Plans to join [employer] and go after work.  PAIN: NPRS: 4/10 left hip/groin when leaning forwards.    Precautions: none  PATIENT GOALS: Get the cramp out of my calf, to eleviate whatever pissed it off this time out of the leg   OBJECTIVE  1RM calculated from <10RM (last tested 09/30/2023) Knee extension at [facility], seat position 1 R: 58.7# L: 43.2# Hamstring curl at [facility], seat position 1 R: 46.7# L: 46.7#   SELF-REPORTED FUNCTION Patient Specific Functional Scale (PSFS)  Repetitively bending over (to do things like picking up sticks from the yard and clean her home): 4/10 Strenuous activity like lifting things: 4/10 Prolonged standing: 6/10 Average: 4.7/10   FUNCTIONAL/BALANCE TESTS: Plank: 32 seconds   Biering-Sorenson test:  Purpose of test:  assess lumbar extensor endurance Results: 43 seconds Analysis: patient scored the same  as her previous score, which is above her long term goal of 34 seconds, but is far below the age matched norm of 120 seconds for adult females.   Floor to waist box lift: 60# max before stopping due to being uncomfortable with increased weight due to strain. Also had some tingling in L calf.   TODAY'S TREATMENT  Therapeutic activities: dynamic therapeutic activities incorporating MULTIPLE parameters or areas of the body designed to achieve improved functional performance.   5 second eccentric squat in front of chair to tap chair and rise holding 15# DB in front of her body, focusing on holding trunk neutral with abdominal and glute engagement to improve functional lifting and mobility tolerance 1x10 with 1 second ascent  10 second eccentric squat in front of chair to tap chair and rise holding 15# DB in front of her body, focusing on holding trunk neutral with abdominal and glute engagement to improve functional lifting and mobility tolerance 1x10 with 4 second ascent Updated HEP  Floor to waist lift testing (see above): 1 rep at each weight starting at 10# box and increasing by 5# each rep until at max comfortable weight (60#).   Therapeutic exercise: therapeutic exercises that incorporate ONE parameter at one or more areas of the body to centralize symptoms, develop strength and endurance, range of motion, and flexibility required for successful completion of functional activities.  Plank testing (see above)  Education on HEP including handout   Education on POC  Physical Performance Test or Measurement: a physical performance test or measurement with written report.  Biering-Sorenson test (see above)  Neuromuscular Re-education: a technique or exercise performed with the goal of improving the level of communication between the body and the brain, such as for balance, motor control, muscle  activation patterns, coordination, desensitization, quality of muscle contraction, proprioception, and/or kinesthetic sense needed for successful and safe completion of functional activities.   Standing pallof press (multifidus press) with TrA/glute activation and slight PPT (as needed for improved core stability) 1x10 each side with 5 second holds with 10# cable Tighter on the left with anchor to the left Tingling down to the left calf  Pt required multimodal cuing for proper technique and to facilitate improved neuromuscular control, strength, range of motion, and functional ability resulting in improved performance and form.   PATIENT EDUCATION:  Education details: Exercise purpose/form. Self management techniques. Education on diagnosis, prognosis, POC, anatomy and physiology of current condition. Education on HEP including handout. Person educated: Patient Education method: Medical Illustrator Education comprehension: verbalized understanding and returned demonstration, needs further education.   HOME EXERCISE PROGRAM: Access Code: [email or web address] URL: [email or web address] Date: 02/24/2024 Prepared by: Camie Cleverly  Exercises - Posterior Pelvic Tilt at Wall Arms Overhead  - 1 x daily - 1 sets - 10 reps - Standing Anti-Rotation Press with Anchored Resistance  - 1 x daily - 2 sets - 10 reps - 5 seconds hold - Eccentric Squat  - 1 x daily - 2 sets - 10 reps - 5 seconds time to lower   ASSESSMENT:  CLINICAL IMPRESSION:   Patient has completed 60 physical therapy sessions since starting current episode of care on 07/01/2022. It has been 5 weeks since her last PT session. She has been completing her HEP successfully since then except the last week since Thanksgiving and has been having less pain and functionaln limitations. Since last visit her plank and Biering-Sorenson test has remained about the same, but she met her floor to waist lifting goal  of 60# box..Her PSFS was  slightly higher than 5 weeks ago but still lower than it was on the progress note before that. Patient is interested in being more independent with her self-management but fearful of discharging from PT without the ability to easily return if she has a flair up. She would benefit from 1 or more visits to progress HEP to a better long term version and/or update it as she starts increasing her exercise training at the gym. Patient would benefit from continued management of limiting condition by skilled physical therapist to address remaining impairments and functional limitations to work towards stated goals and return to PLOF or maximal functional independence.   OBJECTIVE IMPAIRMENTS: Abnormal gait, decreased activity tolerance, decreased balance, decreased endurance, decreased knowledge of condition, decreased mobility, difficulty walking, decreased ROM, decreased strength, increased fascial restrictions, impaired perceived functional ability, increased muscle spasms, impaired flexibility, improper body mechanics, postural dysfunction, obesity, and pain.   ACTIVITY LIMITATIONS: carrying, lifting, bending, sitting, standing, squatting, stairs, transfers, hygiene/grooming, locomotion level, and caring for others  PARTICIPATION LIMITATIONS: meal prep, cleaning, laundry, driving, shopping, community activity, occupation, yard work, and  working, walking, prolonged standing, bending, lifting, yardwork, housework, sleeping, traveling, prolonged sitting, going to [employer], anything that requires repetitive bending or lifting   PERSONAL FACTORS: Fitness, Past/current experiences, Time since onset of injury/illness/exacerbation, and 1-2 comorbidities: lateral meniscus tear of left knee; Palpitation; Atypical chest pain; Family history of heart disease; Shortness of breath; and Sinus tachycardia on their problem list. past medical history of Acute meniscal tear of knee, Allergy, Anxiety, Asthma, Atrial fibrillation  (HCC), COVID-19 (04/2020), Depression, Ovarian cyst, PVC (premature ventricular contraction), Scoliosis, Shingles, and Tachycardia. past surgical history that includes RIGHT INDEX  FINGER TENDON REPAIR (NOV 2013); Knee arthroscopy with lateral menisectomy (Left, 09/01/2012); and Meniscus repair (Right) are also affecting patient's functional outcome.   REHAB POTENTIAL: Good  CLINICAL DECISION MAKING: Evolving/moderate complexity  EVALUATION COMPLEXITY: Moderate  GOALS: Goals reviewed with patient? Yes  SHORT TERM GOALS: Target date: 08/07/22. Target date updated to 12/04/2022 on 11/19/2022.   Pt will be independent with initial HEP in order to improve strength and balance in order to decrease fall risk and improve function at home and work. Baseline:HEP given; patient not participating (11/19/2022); participating regularly (12/02/2022);  Goal status: MET  LONG TERM GOALS: Target date: 08/26/22. Target date updated to 02/11/2023 for all unmet goals on 11/19/2022. Target date updated to 04/14/2023 for all unmet goals on 01/20/2023. Target date updated to 10/21/2023 or all unmet goals on 07/29/2023. Target date updated to 01/27/2024 for all unmet goals on 11/04/2023. Updated to 05/18/2024 for all unmet goals on 02/24/2024.    2.  Pt will decrease worst pain as reported on NPRS by at least 3 points in order to demonstrate clinically significant reduction in pain. Baseline: 4/10 (07/01/2022): up to 10/10  (11/19/2022); up to 4-5/10 when burning two days ago, 2.5/10 when excluding the burning (12/06/2022); 3-4/10 in the last 2 weeks (01/20/2023); Pain is 1.5/10 over the past 2 weeks (08/12/2023); up to 3/10 over the last two weeks (11/04/2023); up to 6/10 over the last 3 weeks (02/24/24); not captured (03/30/2024);  Goal status: on-going  3.  Pt will demonstrate plank time of or more to demonstrate age-predicted match of core strength needed for job duties Baseline: 10sec (07/01/2022): 24 seconds (11/19/2022); 23 seconds  (12/26/2022); 37 seconds (01/20/2023); 35 seconds (04/01/2023); 29 seconds (07/29/2023); 35 seconds (11/04/2023); 34 seconds (02/24/24); 32 seconds (03/30/2024);  Goal status:  on-going  4.  Pt will demonstrate Biering-Sorenson test of 34sec or more to demonstrate clinically significant increase in spinal extensor strength needed for job duties Baseline: 20sec (07/01/2022); 58 seconds (11/19/2022); deferred(04/01/23); 54 seconds (07/29/2023); 43 seconds (02/24/24; 03/30/2024); Goal status: MET  5.  Patient will be independent with long term HEP program for self-management of symptoms. Baseline: HEP to be reviewed and updated as needed at visit 7 as appropriate (11/19/22); she has has been participating 2x a week plus PT sessions + what  is left over after PT session (12/26/2022); has not been doing since cholecystectomy on 06/30/2023 (07/29/2023); she is doing them 4x a week (11/04/2023); only doing stretches (02/24/24); not participating out of fear of re-irritation (02/24/2024); participating well in light HEP (03/30/2024);  Goal status: progressing  6.  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  Baseline: 40# (07/29/23); 45# (11/04/2023); 60# (03/30/2024);  Goal status: in progress  7.  Patient will demonstrate improvement in Patient Specific Functional Scale (PSFS) of equal or greater than 8/10 points to reflect clinically significant improvement in patient's most valued functional activities.. Baseline: 4/10 (07/29/23); 6.3/10 (11/04/2023); 4.2/10 (02/24/2024); 4.7/10 (03/30/2024);  Goal status: on-going  PLAN:  PT FREQUENCY: 1-2x/week  PT DURATION: 12 weeks  PLANNED INTERVENTIONS: Therapeutic exercises, Therapeutic activity, Neuromuscular re-education, Balance training, Gait training, Patient/Family education, Self Care, Joint mobilization, Stair training, Dry Needling, Electrical stimulation, Spinal mobilization,  Cryotherapy, Moist heat, Manual therapy, and Re-evaluation.  PLAN FOR NEXT SESSION:  Keep HEP updated with exercises that challenge pt and improve motor control for functional activity tolerance.    Camie SAUNDERS. Juli, PT, DPT 03/30/24, 4:39 PM  [facility] [address] [place], [place] [address] P: [phone] I F: [phone]

## 2024-04-07 ENCOUNTER — Encounter: Payer: Self-pay | Admitting: Physical Therapy

## 2024-04-07 ENCOUNTER — Ambulatory Visit: Admitting: Physical Therapy

## 2024-04-07 DIAGNOSIS — M5459 Other low back pain: Secondary | ICD-10-CM | POA: Diagnosis not present

## 2024-04-07 DIAGNOSIS — M62838 Other muscle spasm: Secondary | ICD-10-CM

## 2024-04-07 DIAGNOSIS — M792 Neuralgia and neuritis, unspecified: Secondary | ICD-10-CM

## 2024-04-07 NOTE — Therapy (Signed)
 OUTPATIENT PHYSICAL THERAPY TREATMENT   Patient Name: Theresa Ortiz MRN: [id number] DOB:[birth year], 33 y.o., female Today's Date: 04/07/2024  END OF SESSION:    PT End of Session - 04/07/24 1649     Visit Number 61    Number of Visits 74    Date for Recertification  05/18/24    Authorization Type [place] [employer] P[employer] reporting period from 03/30/2024    Progress Note Due on Visit 50    PT Start Time 1648    PT Stop Time 1728    PT Time Calculation (min) 40 min    Activity Tolerance Patient tolerated treatment well    Behavior During Therapy [facility] for tasks assessed/performed              Past Medical History:  Diagnosis Date   Acute meniscal tear of knee    left knee   Allergy    Anxiety    Asthma    Atrial fibrillation (HCC)    slight   COVID-19 04/2020   Depression    GERD (gastroesophageal reflux disease)    Ovarian cyst    PVC (premature ventricular contraction)    Scoliosis    Shingles    Tachycardia    Past Surgical History:  Procedure Laterality Date   CHOLECYSTECTOMY N/A 07/01/2023   Procedure: LAPAROSCOPIC CHOLECYSTECTOMY;  Surgeon: Ebbie Cough, MD;  Location: [facility] OR;  Service: General;  Laterality: N/A;  GENERAL & TAP BLOCK   KNEE ARTHROSCOPY WITH LATERAL MENISECTOMY Left 09/01/2012   Procedure: LEFT KNEE ARTHROSCOPY WITH LATERAL MENISCECTOMY ;  Surgeon: Tanda DELENA Heading, MD;  Location: [facility];  Service: Orthopedics;  Laterality: Left;   MENISCUS REPAIR Right    RIGHT INDEX  FINGER TENDON REPAIR  NOV 2013   Patient Active Problem List   Diagnosis Date Noted   Acne rosacea 02/10/2024   Acquired acanthosis nigricans 02/10/2024   Blepharitis 02/10/2024   Elevated liver enzymes 07/21/2023   Administrative encounter 07/21/2023   Helicobacter pylori ab+ 07/21/2023   Leukocytosis 07/21/2023   Microcytosis 07/21/2023   BMI 40.0-44.9, adult (HCC) 07/21/2023   Overactive bladder 07/21/2023   Personal history of COVID-19  07/21/2023   Asthma 04/03/2023   Gastro-esophageal reflux disease without esophagitis 01/22/2019   Palpitation 07/13/2018   Atypical chest pain 07/13/2018   Family history of heart disease 07/13/2018   Shortness of breath 07/13/2018   Sinus tachycardia 07/13/2018   Moderate recurrent major depression (HCC) 05/25/2018   Tobacco user 07/29/2014   Mild persistent asthma, uncomplicated 07/04/2014   Morbid obesity (HCC) 03/16/2013   Impaired fasting glucose 11/18/2012   Acute lateral meniscus tear of left knee 09/01/2012   Attention deficit hyperactivity disorder, predominantly inattentive type 05/03/2011    PCP: Tisovec, Charlie ORN, MD  REFERRING PROVIDER: Delon Gins, MD  REFERRING DIAG: 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 Pt reports she has some pain at the mid calf that is the size of packing tape that feels like a cramp. She reports she has tension at L low back and glute. LBP/glute pain aggravated by lifting/moving patients, and bending forward. Has had L sided sciatic  symptoms 1x/week when lifting something heavy down post LLE. She hs previously had pain with prolonged walking, but that has subsided over the past year or so. Has had successful PT in the past with [facility], which she thinks is very helpful. Pt works full time as an Engineer, structural, enjoys traveling and walking her dog. Comorbid conditions as listed above. No falls in past 6 months. Pt denies N/V, B&B changes, unexplained weight fluctuation, saddle paresthesia, fever, night sweats, or unrelenting night pain at this time. Patient denies hx of cancer, stroke, seizures, diabetes,  unexplained weight loss, unexplained changes in bowel or bladder problems, unexplained stumbling or dropping things, osteoporosis, and spinal surgery.  SUBJECTIVE:                                                                                                                                                                                           SUBJECTIVE STATEMENT:   Patient states she is doing pretty good since last PT visit, but she occasionally gets a little burning pain in her glute on one side or the other, randomly when walking or sitting. She had no increased pain after last PT session, just soreness.  PAIN: NPRS: 2/10 tightness over the sacrum.    Precautions: none  PATIENT GOALS: Get the cramp out of my calf, to eleviate whatever pissed it off this time out of the leg   OBJECTIVE  TODAY'S TREATMENT  Therapeutic exercise: therapeutic exercises that incorporate ONE parameter at one or more areas of the body to centralize symptoms, develop strength and endurance, range of motion, and flexibility required for successful completion of functional activities.  Elbows to toes plank: 6x20 seconds on/off with interval timer Diminishing ability to hold for 20 seconds (down to 10 seconds by end, cued to drop to knees when unable to hold from toes to continue to end of 20 seconds).  Realized she was having difficulty with toes slipping, better when added anti-slip under toes.   Lumbar extension endurance in Biering-Sorenson position 4x30 seconds on/off with interval timer  Therapeutic activities: dynamic therapeutic activities incorporating MULTIPLE parameters or areas of the body designed to achieve improved functional performance.  10 second eccentric squat in front of chair to tap chair and rise holding 20# water tank in Zercher position, focusing on holding trunk neutral with abdominal and glute engagement to improve functional lifting and mobility tolerance 1x10 with 4 second  ascent, using interval timer  Neuromuscular Re-education: a technique or exercise performed with the goal of improving the level of communication between the body and the brain, such as for balance, motor control, muscle activation patterns, coordination, desensitization,  quality of muscle contraction, proprioception, and/or kinesthetic sense needed for successful and safe completion of functional activities.   Hooklying bridge with 20# water tank held over pelvis, focusing on anti-extension and lumbopelvic coordination.  1x20 with 2 second tempo  Step ups with 20# water tank held in Zercher position, for improved propioception and stabilization at the trunk and hips 2x10 each side alternating sides to 6 inch step with mirror feedback   Hooklying PPT with lat pull over, focusing on feeling with low back arches or ribs flair and correcting it 1x2 min moving as fast as possible with 5#DB each hand   Pt required multimodal cuing for proper technique and to facilitate improved neuromuscular control, strength, range of motion, and functional ability resulting in improved performance and form.   PATIENT EDUCATION:  Education details: Exercise purpose/form. Self management techniques. Education on diagnosis, prognosis, POC, anatomy and physiology of current condition. Education on HEP including handout. Person educated: Patient Education method: Medical Illustrator Education comprehension: verbalized understanding and returned demonstration, needs further education.   HOME EXERCISE PROGRAM: Access Code: [email or web address] URL: [email or web address] Date: 02/24/2024 Prepared by: Camie Cleverly  Exercises - Posterior Pelvic Tilt at Wall Arms Overhead  - 1 x daily - 1 sets - 10 reps - Standing Anti-Rotation Press with Anchored Resistance  - 1 x daily - 2 sets - 10 reps - 5 seconds hold - Eccentric Squat  - 1 x daily - 2 sets - 10 reps - 5 seconds time to  lower  ASSESSMENT:  CLINICAL IMPRESSION:   Continued with strengthening, functional, and motor control exercises to improve ability to build strength and functional capacity without irritating low back. Utilized water tank to provide perturbation of balance and trunk and hip stabilizers to improve control and proprioception. Patient started to get mild discomfort in the left glute at the end of the 2nd set of the step up exercise, but this was relieved with rest in hooklying. After session, pt reported fatigue in both legs equally that she did not associate with her concordant symptoms. Plan to work towards updating HEP for more long term use next session. Patient would benefit from continued management of limiting condition by skilled physical therapist to address remaining impairments and functional limitations to work towards stated goals and return to PLOF or maximal functional independence.   OBJECTIVE IMPAIRMENTS: Abnormal gait, decreased activity tolerance, decreased balance, decreased endurance, decreased knowledge of condition, decreased mobility, difficulty walking, decreased ROM, decreased strength, increased fascial restrictions, impaired perceived functional ability, increased muscle spasms, impaired flexibility, improper body mechanics, postural dysfunction, obesity, and pain.   ACTIVITY LIMITATIONS: carrying, lifting, bending, sitting, standing, squatting, stairs, transfers, hygiene/grooming, locomotion level, and caring for others  PARTICIPATION LIMITATIONS: meal prep, cleaning, laundry, driving, shopping, community activity, occupation, yard work, and  working, walking, prolonged standing, bending, lifting, yardwork, housework, sleeping, traveling, prolonged sitting, going to [employer], anything that requires repetitive bending or lifting   PERSONAL FACTORS: Fitness, Past/current experiences, Time since onset of injury/illness/exacerbation, and 1-2 comorbidities: lateral meniscus tear  of left knee; Palpitation; Atypical chest pain; Family history of heart disease; Shortness of breath; and Sinus tachycardia on their problem list. past medical history of Acute meniscal tear of knee, Allergy, Anxiety, Asthma, Atrial fibrillation (HCC), COVID-19 (04/2020), Depression, Ovarian cyst, PVC (premature ventricular contraction), Scoliosis, Shingles, and Tachycardia. past surgical history that includes RIGHT INDEX  FINGER TENDON REPAIR (NOV 2013); Knee arthroscopy with lateral menisectomy (Left, 09/01/2012); and Meniscus repair (Right) are also affecting patient's functional  outcome.   REHAB POTENTIAL: Good  CLINICAL DECISION MAKING: Evolving/moderate complexity  EVALUATION COMPLEXITY: Moderate  GOALS: Goals reviewed with patient? Yes  SHORT TERM GOALS: Target date: 08/07/22. Target date updated to 12/04/2022 on 11/19/2022.   Pt will be independent with initial HEP in order to improve strength and balance in order to decrease fall risk and improve function at home and work. Baseline:HEP given; patient not participating (11/19/2022); participating regularly (12/02/2022);  Goal status: MET  LONG TERM GOALS: Target date: 08/26/22. Target date updated to 02/11/2023 for all unmet goals on 11/19/2022. Target date updated to 04/14/2023 for all unmet goals on 01/20/2023. Target date updated to 10/21/2023 or all unmet goals on 07/29/2023. Target date updated to 01/27/2024 for all unmet goals on 11/04/2023. Updated to 05/18/2024 for all unmet goals on 02/24/2024.    2.  Pt will decrease worst pain as reported on NPRS by at least 3 points in order to demonstrate clinically significant reduction in pain. Baseline: 4/10 (07/01/2022): up to 10/10  (11/19/2022); up to 4-5/10 when burning two days ago, 2.5/10 when excluding the burning (12/06/2022); 3-4/10 in the last 2 weeks (01/20/2023); Pain is 1.5/10 over the past 2 weeks (08/12/2023); up to 3/10 over the last two weeks (11/04/2023); up to 6/10 over the last 3 weeks  (02/24/24); not captured (03/30/2024);  Goal status: on-going  3.  Pt will demonstrate plank time of or more to demonstrate age-predicted match of core strength needed for job duties Baseline: 10sec (07/01/2022): 24 seconds (11/19/2022); 23 seconds (12/26/2022); 37 seconds (01/20/2023); 35 seconds (04/01/2023); 29 seconds (07/29/2023); 35 seconds (11/04/2023); 34 seconds (02/24/24); 32 seconds (03/30/2024);  Goal status: on-going  4.  Pt will demonstrate Biering-Sorenson test of 34sec or more to demonstrate clinically significant increase in spinal extensor strength needed for job duties Baseline: 20sec (07/01/2022); 58 seconds (11/19/2022); deferred(04/01/23); 54 seconds (07/29/2023); 43 seconds (02/24/24; 03/30/2024); Goal status: MET  5.  Patient will be independent with long term HEP program for self-management of symptoms. Baseline: HEP to be reviewed and updated as needed at visit 7 as appropriate (11/19/22); she has has been participating 2x a week plus PT sessions + what  is left over after PT session (12/26/2022); has not been doing since cholecystectomy on 06/30/2023 (07/29/2023); she is doing them 4x a week (11/04/2023); only doing stretches (02/24/24); not participating out of fear of re-irritation (02/24/2024); participating well in light HEP (03/30/2024);  Goal status: progressing  6.  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  Baseline: 40# (07/29/23); 45# (11/04/2023); 60# (03/30/2024);  Goal status: in progress  7.  Patient will demonstrate improvement in Patient Specific Functional Scale (PSFS) of equal or greater than 8/10 points to reflect clinically significant improvement in patient's most valued functional activities.. Baseline: 4/10 (07/29/23); 6.3/10 (11/04/2023); 4.2/10 (02/24/2024); 4.7/10 (03/30/2024);  Goal status: on-going  PLAN:  PT FREQUENCY: 1-2x/week  PT DURATION: 12 weeks  PLANNED  INTERVENTIONS: Therapeutic exercises, Therapeutic activity, Neuromuscular re-education, Balance training, Gait training, Patient/Family education, Self Care, Joint mobilization, Stair training, Dry Needling, Electrical stimulation, Spinal mobilization, Cryotherapy, Moist heat, Manual therapy, and Re-evaluation.  PLAN FOR NEXT SESSION:  Update HEP for longer term use. Keep HEP updated with exercises that challenge pt and improve motor control for functional activity tolerance.    Camie SAUNDERS. Juli, PT, DPT 04/07/24, 7:46 PM  [facility] [address] [place],  [place] [address] P: [phone] I F: [phone]

## 2024-04-08 ENCOUNTER — Ambulatory Visit: Admitting: Podiatry

## 2024-04-09 DIAGNOSIS — Z01419 Encounter for gynecological examination (general) (routine) without abnormal findings: Secondary | ICD-10-CM | POA: Diagnosis not present

## 2024-04-09 DIAGNOSIS — N760 Acute vaginitis: Secondary | ICD-10-CM | POA: Diagnosis not present

## 2024-04-09 DIAGNOSIS — Z124 Encounter for screening for malignant neoplasm of cervix: Secondary | ICD-10-CM | POA: Diagnosis not present

## 2024-04-09 DIAGNOSIS — Z1151 Encounter for screening for human papillomavirus (HPV): Secondary | ICD-10-CM | POA: Diagnosis not present

## 2024-04-09 DIAGNOSIS — Z6841 Body Mass Index (BMI) 40.0 and over, adult: Secondary | ICD-10-CM | POA: Diagnosis not present

## 2024-04-09 DIAGNOSIS — Z789 Other specified health status: Secondary | ICD-10-CM | POA: Diagnosis not present

## 2024-04-09 DIAGNOSIS — N771 Vaginitis, vulvitis and vulvovaginitis in diseases classified elsewhere: Secondary | ICD-10-CM | POA: Diagnosis not present

## 2024-04-12 ENCOUNTER — Ambulatory Visit: Admitting: Podiatry

## 2024-04-12 DIAGNOSIS — M25373 Other instability, unspecified ankle: Secondary | ICD-10-CM

## 2024-04-12 DIAGNOSIS — M19071 Primary osteoarthritis, right ankle and foot: Secondary | ICD-10-CM

## 2024-04-12 NOTE — Progress Notes (Signed)
 She presents today for follow-up of her MRI results right ankle.  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  She states that she has had the lateral ankle instability for quite some time also noticed recently that she has pain across the top of the foot radiating distally and it originates at the ankle.  States that she has stiffness of the ankle when she is standing at a 90 degree angle.  Objective: Vital signs are stable alert and oriented x 3.  Pulses are palpable.  She has pain on palpation of the anterior talofibular ligament area primarily she also has tenderness on palpation of the anterior ankle with palpable exostosis.  MRI does confirm a tear of the anterior talofibular ligament and ankle joint impingement with exostoses.  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  Ankle joint osteoarthritis.  Plan: Discussed etiology pathology conservative or surgical therapies at this point she is will follow-up with Dr. Silva for possible arthroscopy of the ankle and internal bracing for the lateral ankle.

## 2024-04-13 ENCOUNTER — Other Ambulatory Visit: Payer: Self-pay

## 2024-04-13 MED ORDER — AMPHETAMINE-DEXTROAMPHET ER 20 MG PO CP24
20.0000 mg | ORAL_CAPSULE | Freq: Every day | ORAL | 0 refills | Status: DC
  Filled 2024-04-13: qty 30, 30d supply, fill #0

## 2024-04-14 ENCOUNTER — Encounter: Payer: Self-pay | Admitting: Physical Therapy

## 2024-04-14 ENCOUNTER — Ambulatory Visit: Admitting: Physical Therapy

## 2024-04-14 DIAGNOSIS — M5459 Other low back pain: Secondary | ICD-10-CM | POA: Diagnosis not present

## 2024-04-14 DIAGNOSIS — M62838 Other muscle spasm: Secondary | ICD-10-CM

## 2024-04-14 DIAGNOSIS — M792 Neuralgia and neuritis, unspecified: Secondary | ICD-10-CM

## 2024-04-14 NOTE — Patient Instructions (Signed)
 LUMBAR NEURAL TENSION SENSITIVITY  Avoid L-shaped positions with the body: Modify positions/activities by bending knee, pointing toes away, or opening front of hips     LUMBAR (compressive) LOAD SENSITIVITY   Use this knowledge to modify activities or interrupt load throughout the day to decrease total load/irritation.   Increased with the following mechanical stressors: certain positions / postures (see below)  activities bending your hips forwards against resistance (psoas activation; for example, laying on your back lifting legs)  Decreased with  lying down/changing to a position of less pressure (see chart below) traction avoiding hip flexion against resistance (ie, laying on your back lifting legs)

## 2024-04-14 NOTE — Therapy (Signed)
 OUTPATIENT PHYSICAL THERAPY TREATMENT   Patient Name: Theresa Ortiz MRN: [id number] DOB:April 26, 1991, 33 y.o., female Today's Date: 04/14/2024  END OF SESSION:    PT End of Session - 04/14/24 1743     Visit Number 62    Number of Visits 74    Date for Recertification  05/18/24    Authorization Type [place] [employer] PPO reporting period from 03/30/2024    Progress Note Due on Visit 50    PT Start Time 1650    PT Stop Time 1735    PT Time Calculation (min) 45 min    Activity Tolerance Patient tolerated treatment well    Behavior During Therapy [facility] for tasks assessed/performed               Past Medical History:  Diagnosis Date   Acute meniscal tear of knee    left knee   Allergy    Anxiety    Asthma    Atrial fibrillation (HCC)    slight   COVID-19 04/2020   Depression    GERD (gastroesophageal reflux disease)    Ovarian cyst    PVC (premature ventricular contraction)    Scoliosis    Shingles    Tachycardia    Past Surgical History:  Procedure Laterality Date   CHOLECYSTECTOMY N/A 07/01/2023   Procedure: LAPAROSCOPIC CHOLECYSTECTOMY;  Surgeon: Ebbie Cough, MD;  Location: [facility] OR;  Service: General;  Laterality: N/A;  GENERAL & TAP BLOCK   KNEE ARTHROSCOPY WITH LATERAL MENISECTOMY Left 09/01/2012   Procedure: LEFT KNEE ARTHROSCOPY WITH LATERAL MENISCECTOMY ;  Surgeon: Tanda DELENA Heading, MD;  Location: [facility];  Service: Orthopedics;  Laterality: Left;   MENISCUS REPAIR Right    RIGHT INDEX  FINGER TENDON REPAIR  NOV 2013   Patient Active Problem List   Diagnosis Date Noted   Acne rosacea 02/10/2024   Acquired acanthosis nigricans 02/10/2024   Blepharitis 02/10/2024   Elevated liver enzymes 07/21/2023   Administrative encounter 07/21/2023   Helicobacter pylori ab+ 07/21/2023   Leukocytosis 07/21/2023   Microcytosis 07/21/2023   BMI 40.0-44.9, adult (HCC) 07/21/2023   Overactive bladder 07/21/2023   Personal history of COVID-19  07/21/2023   Asthma 04/03/2023   Gastro-esophageal reflux disease without esophagitis 01/22/2019   Palpitation 07/13/2018   Atypical chest pain 07/13/2018   Family history of heart disease 07/13/2018   Shortness of breath 07/13/2018   Sinus tachycardia 07/13/2018   Moderate recurrent major depression (HCC) 05/25/2018   Tobacco user 07/29/2014   Mild persistent asthma, uncomplicated 07/04/2014   Morbid obesity (HCC) 03/16/2013   Impaired fasting glucose 11/18/2012   Acute lateral meniscus tear of left knee 09/01/2012   Attention deficit hyperactivity disorder, predominantly inattentive type 05/03/2011    PCP: Tisovec, Charlie ORN, MD  REFERRING PROVIDER: Delon Gins, MD  REFERRING DIAG: 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 Pt reports she has some pain at the mid calf that is the size of packing tape that feels like a cramp. She reports she has tension at L low back and glute. LBP/glute pain aggravated by lifting/moving patients, and bending forward. Has had L sided  sciatic symptoms 1x/week when lifting something heavy down post LLE. She hs previously had pain with prolonged walking, but that has subsided over the past year or so. Has had successful PT in the past with [facility], which she thinks is very helpful. Pt works full time as an Engineer, structural, enjoys traveling and walking her dog. Comorbid conditions as listed above. No falls in past 6 months. Pt denies N/V, B&B changes, unexplained weight fluctuation, saddle paresthesia, fever, night sweats, or unrelenting night pain at this time. Patient denies hx of cancer, stroke, seizures, diabetes,  unexplained weight loss, unexplained changes in bowel or bladder problems, unexplained stumbling or dropping things, osteoporosis, and spinal surgery.  SUBJECTIVE:                                                                                                                                                                                           SUBJECTIVE STATEMENT:   Patient states she is doing well. Her HEP has been going well since last PT session except the plank. She was a little sore after last PT session in her upper back right after the session and her quads. She can tell the next day was not as bad as when she first started out and could not even sit down on the toilet. She feels like her legs are getting stronger.   PAIN: NPRS: 1/10 tightness over the left hip.   Precautions: none  PATIENT GOALS: Get the cramp out of my calf, to eleviate whatever pissed it off this time out of the leg   OBJECTIVE  TODAY'S TREATMENT  Therapeutic exercise: therapeutic exercises that incorporate ONE parameter at one or more areas of the body to centralize symptoms, develop strength and endurance, range of motion, and flexibility required for successful completion of functional activities.  Education on mechanical stressors in regards to lumbar position, neural tension, load with demonstrations and handouts on how to modify activities or progress-regress exercises and self-care in case of flair up.   Verbal review of pallof side step exercise for HEP.   Elbows to toes plank: 8x15:15 seconds on/off with interval timer Helped pt find this to download on her phone  Hooklying bridge with 15# DB held over each ASIS,  focusing on anti-extension and lumbopelvic coordination.  1x10 with 2 second hold Added to HEP  Therapeutic activities: dynamic therapeutic activities incorporating MULTIPLE parameters or areas of the body designed to achieve improved functional performance.  10 second eccentric  squat in front of chair to tap low plinth and rise holding a 15#DB in each hand, focusing on holding trunk neutral with abdominal and glute engagement  to improve functional lifting and mobility tolerance 1x10 with 4 second ascent, using interval timer  Pt required multimodal cuing for proper technique and to facilitate improved neuromuscular control, strength, range of motion, and functional ability resulting in improved performance and form.   PATIENT EDUCATION:  Education details: mechanical stressors, regression/progression, what to do if she gets a flair up, Exercise purpose/form. Self management techniques. Education on diagnosis, prognosis, POC, anatomy and physiology of current condition. Education on HEP including handout. Person educated: Patient Education method: Medical Illustrator Education comprehension: verbalized understanding and returned demonstration, needs further education.   HOME EXERCISE PROGRAM: Access Code: [email or web address] URL: [email or web address] Date: 04/14/2024 Prepared by: Camie Cleverly  Exercises - Standard Plank  - 3-4 x weekly - 8 reps - 15 seconds hold - 15 seconds rest between reps - Bridge on Heels  - 3-4 x weekly - 2-3 sets - 10 reps - 2 seconds hold - Eccentric Squat  - 3-4 x weekly - 2-3 sets - 10 reps - 10 seconds time to lower - Standing Anti-Rotation Press with Anchored Resistance  - 3-4 x weekly - 2-3 sets - 10 reps  HOME EXERCISE PROGRAM [[id number]]  PTP PALLOF PRESS W/ SIDE STEP -  Repeat 10 Repetitions, Complete 2 Sets, Perform 3 Times a Week  ASSESSMENT:  CLINICAL IMPRESSION:   Today's session focused on updating HEP for longer term use and education on mechanical stressors and recommendations for managing possible flair ups in the future as patient is working towards discharge to independent management with long term HEP. Patient tolerated session well without increased pain but is nervous about discharge due to no being able  to easliy return to PT if she should have a flair up. She seemed to understand education on how to modify exercises in the case of a flair up and how to pay attention to mechanical stressors to help recover more quickly. Patient has one more visit schedule to check in before hopefully being ready for discharge in January. Patient would benefit from continued management of limiting condition by skilled physical therapist to address remaining impairments and functional limitations to work towards stated goals and return to PLOF or maximal functional independence.    OBJECTIVE IMPAIRMENTS: Abnormal gait, decreased activity tolerance, decreased balance, decreased endurance, decreased knowledge of condition, decreased mobility, difficulty walking, decreased ROM, decreased strength, increased fascial restrictions, impaired perceived functional ability, increased muscle spasms, impaired flexibility, improper body mechanics, postural dysfunction, obesity, and pain.   ACTIVITY LIMITATIONS: carrying, lifting, bending, sitting, standing, squatting, stairs, transfers, hygiene/grooming, locomotion level, and caring for others  PARTICIPATION LIMITATIONS: meal prep, cleaning, laundry, driving, shopping, community activity, occupation, yard work, and  working, walking, prolonged standing, bending, lifting, yardwork, housework, sleeping, traveling, prolonged sitting, going to [employer], anything that requires repetitive bending or lifting   PERSONAL FACTORS: Fitness, Past/current experiences, Time since onset of injury/illness/exacerbation, and 1-2 comorbidities: lateral meniscus tear of left knee; Palpitation; Atypical chest pain; Family history of heart disease; Shortness of breath; and Sinus tachycardia on their problem list. past medical history of Acute meniscal tear of knee, Allergy, Anxiety, Asthma, Atrial fibrillation (HCC), COVID-19 (04/2020), Depression, Ovarian cyst, PVC (premature ventricular contraction),  Scoliosis, Shingles, and Tachycardia. past surgical history that includes RIGHT INDEX  FINGER TENDON REPAIR (NOV 2013); Knee arthroscopy with lateral menisectomy (Left, 09/01/2012); and Meniscus repair (Right) are also affecting patient's functional outcome.   REHAB POTENTIAL: Good  CLINICAL DECISION MAKING: Evolving/moderate complexity  EVALUATION COMPLEXITY: Moderate  GOALS: Goals reviewed with patient?  Yes  SHORT TERM GOALS: Target date: 08/07/22. Target date updated to 12/04/2022 on 11/19/2022.   Pt will be independent with initial HEP in order to improve strength and balance in order to decrease fall risk and improve function at home and work. Baseline:HEP given; patient not participating (11/19/2022); participating regularly (12/02/2022);  Goal status: MET  LONG TERM GOALS: Target date: 08/26/22. Target date updated to 02/11/2023 for all unmet goals on 11/19/2022. Target date updated to 04/14/2023 for all unmet goals on 01/20/2023. Target date updated to 10/21/2023 or all unmet goals on 07/29/2023. Target date updated to 01/27/2024 for all unmet goals on 11/04/2023. Updated to 05/18/2024 for all unmet goals on 02/24/2024.    2.  Pt will decrease worst pain as reported on NPRS by at least 3 points in order to demonstrate clinically significant reduction in pain. Baseline: 4/10 (07/01/2022): up to 10/10  (11/19/2022); up to 4-5/10 when burning two days ago, 2.5/10 when excluding the burning (12/06/2022); 3-4/10 in the last 2 weeks (01/20/2023); Pain is 1.5/10 over the past 2 weeks (08/12/2023); up to 3/10 over the last two weeks (11/04/2023); up to 6/10 over the last 3 weeks (02/24/24); not captured (03/30/2024);  Goal status: on-going  3.  Pt will demonstrate plank time of or more to demonstrate age-predicted match of core strength needed for job duties Baseline: 10sec (07/01/2022): 24 seconds (11/19/2022); 23 seconds (12/26/2022); 37 seconds (01/20/2023); 35 seconds (04/01/2023); 29 seconds (07/29/2023); 35 seconds  (11/04/2023); 34 seconds (02/24/24); 32 seconds (03/30/2024);  Goal status: on-going  4.  Pt will demonstrate Biering-Sorenson test of 34sec or more to demonstrate clinically significant increase in spinal extensor strength needed for job duties Baseline: 20sec (07/01/2022); 58 seconds (11/19/2022); deferred(04/01/23); 54 seconds (07/29/2023); 43 seconds (02/24/24; 03/30/2024); Goal status: MET  5.  Patient will be independent with long term HEP program for self-management of symptoms. Baseline: HEP to be reviewed and updated as needed at visit 7 as appropriate (11/19/22); she has has been participating 2x a week plus PT sessions + what  is left over after PT session (12/26/2022); has not been doing since cholecystectomy on 06/30/2023 (07/29/2023); she is doing them 4x a week (11/04/2023); only doing stretches (02/24/24); not participating out of fear of re-irritation (02/24/2024); participating well in light HEP (03/30/2024);  Goal status: progressing  6.  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  Baseline: 40# (07/29/23); 45# (11/04/2023); 60# (03/30/2024);  Goal status: in progress  7.  Patient will demonstrate improvement in Patient Specific Functional Scale (PSFS) of equal or greater than 8/10 points to reflect clinically significant improvement in patient's most valued functional activities.. Baseline: 4/10 (07/29/23); 6.3/10 (11/04/2023); 4.2/10 (02/24/2024); 4.7/10 (03/30/2024);  Goal status: on-going  PLAN:  PT FREQUENCY: 1-2x/week  PT DURATION: 12 weeks  PLANNED INTERVENTIONS: Therapeutic exercises, Therapeutic activity, Neuromuscular re-education, Balance training, Gait training, Patient/Family education, Self Care, Joint mobilization, Stair training, Dry Needling, Electrical stimulation, Spinal mobilization, Cryotherapy, Moist heat, Manual therapy, and Re-evaluation.  PLAN FOR NEXT SESSION:  Consider  discharge.  Keep HEP updated with exercises that challenge pt and improve motor control for functional activity tolerance.   Camie SAUNDERS. Juli, PT, DPT 04/14/2024, 5:46 PM  [facility] [address] [place], [place] [address] P: [phone] I F: [phone]

## 2024-04-19 ENCOUNTER — Other Ambulatory Visit: Payer: Self-pay

## 2024-04-20 ENCOUNTER — Ambulatory Visit: Admitting: Physical Therapy

## 2024-04-27 ENCOUNTER — Ambulatory Visit: Admitting: Physical Therapy

## 2024-05-04 ENCOUNTER — Ambulatory Visit: Admitting: Physical Therapy

## 2024-05-06 ENCOUNTER — Encounter: Payer: Self-pay | Admitting: Physical Therapy

## 2024-05-06 ENCOUNTER — Ambulatory Visit: Attending: Physical Medicine & Rehabilitation | Admitting: Physical Therapy

## 2024-05-06 DIAGNOSIS — M62838 Other muscle spasm: Secondary | ICD-10-CM | POA: Insufficient documentation

## 2024-05-06 DIAGNOSIS — M5459 Other low back pain: Secondary | ICD-10-CM | POA: Insufficient documentation

## 2024-05-06 DIAGNOSIS — M792 Neuralgia and neuritis, unspecified: Secondary | ICD-10-CM | POA: Diagnosis present

## 2024-05-06 NOTE — Therapy (Signed)
 " OUTPATIENT PHYSICAL THERAPY TREATMENT / DISCHARGE Dates of reporting from 07/01/2022 to 05/06/2024  Patient Name: Theresa Ortiz MRN: [id number] DOB:22-Jul-1990, 34 y.o., female Today's Date: 05/06/2024  END OF SESSION:    PT End of Session - 05/06/24 1653     Visit Number 63    Number of Visits 74    Date for Recertification  05/18/24    Authorization Type  [employer] PPO reporting period from 03/30/2024    Progress Note Due on Visit 50    PT Start Time 1647    PT Stop Time 1725    PT Time Calculation (min) 38 min    Activity Tolerance Patient tolerated treatment well    Behavior During Therapy [facility] for tasks assessed/performed           Past Medical History:  Diagnosis Date   Acute meniscal tear of knee    left knee   Allergy    Anxiety    Asthma    Atrial fibrillation (HCC)    slight   COVID-19 04/2020   Depression    GERD (gastroesophageal reflux disease)    Ovarian cyst    PVC (premature ventricular contraction)    Scoliosis    Shingles    Tachycardia    Past Surgical History:  Procedure Laterality Date   CHOLECYSTECTOMY N/A 07/01/2023   Procedure: LAPAROSCOPIC CHOLECYSTECTOMY;  Surgeon: Ebbie Cough, MD;  Location: [facility] OR;  Service: General;  Laterality: N/A;  GENERAL & TAP BLOCK   KNEE ARTHROSCOPY WITH LATERAL MENISECTOMY Left 09/01/2012   Procedure: LEFT KNEE ARTHROSCOPY WITH LATERAL MENISCECTOMY ;  Surgeon: Tanda DELENA Heading, MD;  Location: [facility];  Service: Orthopedics;  Laterality: Left;   MENISCUS REPAIR Right    RIGHT INDEX  FINGER TENDON REPAIR  NOV 2013   Patient Active Problem List   Diagnosis Date Noted   Acne rosacea 02/10/2024   Acquired acanthosis nigricans 02/10/2024   Blepharitis 02/10/2024   Elevated liver enzymes 07/21/2023   Administrative encounter 07/21/2023   Helicobacter pylori ab+ 07/21/2023   Leukocytosis 07/21/2023   Microcytosis 07/21/2023   BMI 40.0-44.9, adult (HCC) 07/21/2023   Overactive bladder  07/21/2023   Personal history of COVID-19 07/21/2023   Asthma 04/03/2023   Gastro-esophageal reflux disease without esophagitis 01/22/2019   Palpitation 07/13/2018   Atypical chest pain 07/13/2018   Family history of heart disease 07/13/2018   Shortness of breath 07/13/2018   Sinus tachycardia 07/13/2018   Moderate recurrent major depression (HCC) 05/25/2018   Tobacco user 07/29/2014   Mild persistent asthma, uncomplicated 07/04/2014   Morbid obesity (HCC) 03/16/2013   Impaired fasting glucose 11/18/2012   Acute lateral meniscus tear of left knee 09/01/2012   Attention deficit hyperactivity disorder, predominantly inattentive type 05/03/2011    PCP: Tisovec, Charlie ORN, MD  REFERRING PROVIDER: Delon Gins, MD  REFERRING DIAG: 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 Pt reports she has some pain at the mid calf that is the size of packing tape that feels like a cramp. She reports she has tension at L low back and glute. LBP/glute pain aggravated by lifting/moving patients, and bending  forward. Has had L sided sciatic symptoms 1x/week when lifting something heavy down post LLE. She hs previously had pain with prolonged walking, but that has subsided over the past year or so. Has had successful PT in the past with [facility], which she thinks is very helpful. Pt works full time as an Engineer, structural, enjoys traveling and walking her dog. Comorbid conditions as listed above. No falls in past 6 months. Pt denies N/V, B&B changes, unexplained weight fluctuation, saddle paresthesia, fever, night sweats, or unrelenting night pain at this time. Patient denies  hx of cancer, stroke, seizures, diabetes, unexplained weight loss, unexplained changes in bowel or bladder problems, unexplained stumbling or dropping things, osteoporosis, and spinal surgery.  SUBJECTIVE:                                                                                                                                                                                           SUBJECTIVE STATEMENT:   Patients states she is sore today. Yesterday was a hard day at work. She had a lot of soreness and stiffness in her upper back to her lower back and all the way down to her left leg. HEP has been okay, but could have been better. She did not do her HEP yesterday because she was so sore. She states she feels PT has been helping her and it is nice to be able to come back to PT if something happens. Because her pain gets irritated really easily. She states overall her condition has been more pissed off than usual. She has been trying to be more mindful of her posture when her leg is hurting to avoid neural tension positions. She is thinking about going to burn boot camp. She states she is in agreement with discharging today. Although she would like the security of easy access to PT when she has flair ups, she feels she has been given the tools to work on self management at this point.   PAIN: NPRS: 3/10 left low back  Precautions: none  PATIENT GOALS: Get the cramp out of my calf, to eleviate whatever pissed it off this time out of the leg   OBJECTIVE   SELF-REPORTED FUNCTION Patient Specific Functional Scale (PSFS)  Repetitively bending over (to do things like picking up sticks from the yard and clean her home): 3.5/10 Strenuous activity like lifting things: 2/10 Prolonged standing: 3/10 Average: 2.8/10   FUNCTIONAL/BALANCE TESTS: Plank: 33 seconds    Biering-Sorenson test:  Purpose of test: assess lumbar extensor endurance Results: 46 seconds Analysis: patient scored 3  seconds more than her previous score, which is above her long  term goal of 34 seconds, but is far below the age matched norm of 120 seconds for adult females.    TODAY'S TREATMENT  Physical Performance Test or Measurement: a physical performance test or measurement with written report.  Biering-Sorenson test (see above)   Therapeutic exercise: therapeutic exercises that incorporate ONE parameter at one or more areas of the body to centralize symptoms, develop strength and endurance, range of motion, and flexibility required for successful completion of functional activities.  Plank testing (see above)  Elbows to toes plank: 5x25:25 seconds on/off with interval timer Going to knees when unable to hold from toes  Hooklying bridge with DB held over each ASIS,  focusing on anti-extension and lumbopelvic coordination.  1x10 with 2 second hold,  one 15#DB 1x10 with 2 second hold, one #DB over each side.  Pt required multimodal cuing for proper technique and to facilitate improved neuromuscular control, strength, range of motion, and functional ability resulting in improved performance and form.   PATIENT EDUCATION:  Education details: discharge recommendations, self management. Person educated: Patient Education method: Medical Illustrator Education comprehension: verbalized understanding and returned demonstration  HOME EXERCISE PROGRAM: Access Code: [email or web address] URL: [email or web address] Date: 04/14/2024 Prepared by: Camie Cleverly  Exercises - Standard Plank  - 3-4 x weekly - 8 reps - 15 seconds hold - 15 seconds rest between reps - Bridge on Heels  - 3-4 x weekly - 2-3 sets - 10 reps - 2 seconds hold - Eccentric Squat  - 3-4 x weekly - 2-3 sets - 10 reps - 10 seconds time to lower - Standing Anti-Rotation Press with Anchored Resistance  - 3-4 x weekly - 2-3 sets - 10 reps  HOME EXERCISE PROGRAM [[id number]]  PTP PALLOF PRESS W/ SIDE STEP -  Repeat 10  Repetitions, Complete 2 Sets, Perform 3 Times a Week  ASSESSMENT:  CLINICAL IMPRESSION:   Patient has attended 63 visits since starting current episode of care on 07/01/2022. She has met her long term lifting goal, Biering-Sorenson test goal (back extensor endurance), and long term HEP goal. She made progress towards her long term plank (anterior core endurance) goal. She has not improved her long term PSFS or pain goals. Patient continues to experience fluctuation of symptoms and struggles to progress markers of core strength (plank and Biering-Sorenson) further after initial improvements. She does appear to have a better understanding of strategies for self management and is ready to discharge due to lack of continued progress and appearing ready to use self-management to help manage her remaining difficulties. She may benefit from PT again in the future if she has recurrence of limitations and needs skilled care to address issues beyond her own ability. She is now discharged from PT.    OBJECTIVE IMPAIRMENTS: Abnormal gait, decreased activity tolerance, decreased balance, decreased endurance, decreased knowledge of condition, decreased mobility, difficulty walking, decreased ROM, decreased strength, increased fascial restrictions, impaired perceived functional ability, increased muscle spasms, impaired flexibility, improper body mechanics, postural dysfunction, obesity, and pain.   ACTIVITY LIMITATIONS: carrying, lifting, bending, sitting, standing, squatting, stairs, transfers, hygiene/grooming, locomotion level, and caring for others  PARTICIPATION LIMITATIONS: meal prep, cleaning, laundry, driving, shopping, community activity, occupation, yard work, and  working, walking, prolonged standing, bending, lifting, yardwork, housework, sleeping, traveling, prolonged sitting, going to [employer], anything that requires repetitive bending or lifting   PERSONAL FACTORS: Fitness, Past/current experiences,  Time since onset of injury/illness/exacerbation, and 1-2 comorbidities: lateral meniscus tear of left knee; Palpitation; Atypical chest pain; Family history of  heart disease; Shortness of breath; and Sinus tachycardia on their problem list. past medical history of Acute meniscal tear of knee, Allergy, Anxiety, Asthma, Atrial fibrillation (HCC), COVID-19 (04/2020), Depression, Ovarian cyst, PVC (premature ventricular contraction), Scoliosis, Shingles, and Tachycardia. past surgical history that includes RIGHT INDEX  FINGER TENDON REPAIR (NOV 2013); Knee arthroscopy with lateral menisectomy (Left, 09/01/2012); and Meniscus repair (Right) are also affecting patient's functional outcome.   REHAB POTENTIAL: Good  CLINICAL DECISION MAKING: Evolving/moderate complexity  EVALUATION COMPLEXITY: Moderate  GOALS: Goals reviewed with patient? Yes  SHORT TERM GOALS: Target date: 08/07/22. Target date updated to 12/04/2022 on 11/19/2022.   Pt will be independent with initial HEP in order to improve strength and balance in order to decrease fall risk and improve function at home and work. Baseline:HEP given; patient not participating (11/19/2022); participating regularly (12/02/2022);  Goal status: MET  LONG TERM GOALS: Target date: 08/26/22. Target date updated to 02/11/2023 for all unmet goals on 11/19/2022. Target date updated to 04/14/2023 for all unmet goals on 01/20/2023. Target date updated to 10/21/2023 or all unmet goals on 07/29/2023. Target date updated to 01/27/2024 for all unmet goals on 11/04/2023. Updated to 05/18/2024 for all unmet goals on 02/24/2024.    2.  Pt will decrease worst pain as reported on NPRS by at least 3 points in order to demonstrate clinically significant reduction in pain. Baseline: 4/10 (07/01/2022): up to 10/10  (11/19/2022); up to 4-5/10 when burning two days ago, 2.5/10 when excluding the burning (12/06/2022); 3-4/10 in the last 2 weeks (01/20/2023); Pain is 1.5/10 over the past 2 weeks  (08/12/2023); up to 3/10 over the last two weeks (11/04/2023); up to 6/10 over the last 3 weeks (02/24/24); not captured (03/30/2024); up to 5/10 in the last 2 weeks (05/07/2023);  Goal status: Not met  3.  Pt will demonstrate plank time of or more to demonstrate age-predicted match of core strength needed for job duties Baseline: 10sec (07/01/2022): 24 seconds (11/19/2022); 23 seconds (12/26/2022); 37 seconds (01/20/2023); 35 seconds (04/01/2023); 29 seconds (07/29/2023); 35 seconds (11/04/2023); 34 seconds (02/24/24); 32 seconds (03/30/2024); 33 seconds (05/07/23); Goal status: partially met  4.  Pt will demonstrate Biering-Sorenson test of 34sec or more to demonstrate clinically significant increase in spinal extensor strength needed for job duties Baseline: 20sec (07/01/2022); 58 seconds (11/19/2022); deferred(04/01/23); 54 seconds (07/29/2023); 43 seconds (02/24/24; 03/30/2024); 47 seconds (05/06/24); Goal status: MET  5.  Patient will be independent with long term HEP program for self-management of symptoms. Baseline: HEP to be reviewed and updated as needed at visit 7 as appropriate (11/19/22); she has has been participating 2x a week plus PT sessions + what  is left over after PT session (12/26/2022); has not been doing since cholecystectomy on 06/30/2023 (07/29/2023); she is doing them 4x a week (11/04/2023); only doing stretches (02/24/24); not participating out of fear of re-irritation (02/24/2024); participating well in light HEP (03/30/2024); participating well (18/26); Goal status: MET  6.  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  Baseline: 40# (07/29/23); 45# (11/04/2023); 60# (03/30/2024);  Goal status: MET 03/30/24  7.  Patient will demonstrate improvement in Patient Specific Functional Scale (PSFS) of equal or greater than 8/10 points to reflect clinically significant improvement in patient's most valued  functional activities.. Baseline: 4/10 (07/29/23); 6.3/10 (11/04/2023); 4.2/10 (02/24/2024); 4.7/10 (03/30/2024); 2.8/10 (05/06/24); Goal status: Not met  PLAN:  PT FREQUENCY: 1-2x/week  PT  DURATION: 12 weeks  PLANNED INTERVENTIONS: Therapeutic exercises, Therapeutic activity, Neuromuscular re-education, Balance training, Gait training, Patient/Family education, Self Care, Joint mobilization, Stair training, Dry Needling, Electrical stimulation, Spinal mobilization, Cryotherapy, Moist heat, Manual therapy, and Re-evaluation.  PLAN FOR NEXT SESSION:  Patient is now discharged from PT to self management with long term HEP.   Camie SAUNDERS. Juli, PT, DPT 05/06/2024, 5:36 PM  [facility] [address] [place], [place] [address] P: [phone] I F: [phone]  "

## 2024-05-31 ENCOUNTER — Ambulatory Visit: Admitting: Podiatry

## 2024-06-02 ENCOUNTER — Ambulatory Visit: Admitting: Podiatry

## 2024-06-02 VITALS — Ht 65.0 in

## 2024-06-02 DIAGNOSIS — M25371 Other instability, right ankle: Secondary | ICD-10-CM

## 2024-06-02 DIAGNOSIS — S93491D Sprain of other ligament of right ankle, subsequent encounter: Secondary | ICD-10-CM

## 2024-06-02 DIAGNOSIS — M958 Other specified acquired deformities of musculoskeletal system: Secondary | ICD-10-CM

## 2024-06-02 NOTE — Patient Instructions (Signed)
 Chronic Ankle Instability & Rehab Chronic Ankle Instability Chronic ankle instability is a condition that makes the ankle weak and more likely to give way. The condition is common among athletes, especially those with prior ankle ligament injury. Ligaments are strong tissues that connectbones to each other. What are the causes?  This condition is caused by multiple ankle sprains that have not healedproperly, leaving the ankle ligaments loose or damaged. What increases the risk? This condition is more likely to develop in people who participate in sports in which there is a risk of spraining an ankle. These sports include: Cross-country trail running. Basketball. Baseball. Tennis. Football. Soccer. What are the signs or symptoms? Symptoms of this condition include: Rolling your ankle repeatedly. Swelling. Pain. Bruising. Tenderness. Feeling wobbly or unsteady on your foot. Difficulty walking on uneven surfaces or in the dark. How is this diagnosed? This condition may be diagnosed based on: Your symptoms. Your medical history. A physical exam. Your health care provider will check your balance, strength, and range of motion. He or she will also check your injured ankle against your healthy ankle. Imaging tests, such as: An X-ray. A CT scan. An MRI. An ultrasound. How is this treated? Treatment for this condition may include: Wearing a removable boot, brace, or splint. Wearing supportive shoes or shoe inserts. Applying ice to the ankle to reduce swelling. Taking anti-inflammatory pain medicine. Doing exercises (physical therapy). Not putting any body weight, or putting only limited body weight, on your ankle for several days. Gradually returning to full activity. Surgery to repair damaged ligaments. Usually, surgery is needed only if the condition is severe or if othertreatments do not work. Follow these instructions at home: If you have a boot, brace, or splint: Wear it  as told by your health care provider. Remove it only as told by your health care provider. Loosen it if your toes tingle, become numb, or turn cold and blue. Keep it clean. If it is not waterproof: Do not let it get wet. Cover it with a watertight covering when you take a bath or a shower. Ask your health care provider when it is safe to drive with a boot, brace, or splint on your foot. Managing pain, stiffness, and swelling  If directed, put ice on the injured area. If you have a removable boot, brace, or splint, remove it as told by your health care provider. Put ice in a plastic bag. Place a towel between your skin and the bag. Leave the ice on for 20 minutes, 2-3 times a day. Move your toes, foot, and ankle often to reduce stiffness and swelling. Raise (elevate) the injured area above the level of your heart while you are sitting or lying down.  Activity Return to your normal activities as told by your health care provider. Ask your health care provider what activities are safe for you. Do not put your full body weight on your ankle until your health care provider says that you can. Do not do any activities that make pain or swelling worse. Do exercises as told by your health care provider. General instructions Take over-the-counter and prescription medicines only as told by your health care provider. Wear supportive shoes or inserts as told by your health care provider. Keep all follow-up visits as told by your health care provider. This is important. How is this prevented? Wear supportive footwear that is appropriate for your athletic activity. Avoid athletic activities that cause pain or swelling in your ankle. See your health   care provider if you have an ankle sprain that causes pain and swelling for more than 2-4 weeks. Do ankle range-of-motion and strengthening exercises as told by your health care provider before beginning any athletic activity. If you start a new athletic  activity, start gradually to build up your strength and flexibility. Contact a health care provider if: Your condition is not getting better after 2-4 weeks of treatment. You cannot put body weight on your ankle without feeling more pain. Summary Chronic ankle instability is a condition that makes the ankle weak and more likely to give way. This condition is caused by multiple ankle sprains that have not healed properly, leaving the ankle ligaments loose or damaged. Treatment includes wearing a boot, brace, or splint, taking medicines for pain and inflammation, and using ice on the affected area. Follow your health care provider's instructions for caring for your ankle during recovery. Contact your health care provider if your ankle does not get better in 2-4 weeks, or if you cannot put weight on your ankle without feeling more pain. This information is not intended to replace advice given to you by your health care provider. Make sure you discuss any questions you have with your healthcare provider. Document Revised: 01/27/2018 Document Reviewed: 01/27/2018 [employer] Patient Education  2022 [employer]    Ask your health care provider which exercises are safe for you. Do exercises exactly as told by your health care provider and adjust them as directed. It is normal to feel mild stretching, pulling, tightness, or discomfort as you do these exercises. Stop right away if you feel sudden pain or your pain gets worse. Do not begin these exercises until told by your health care provider. Strengthening exercises These exercises build strength and endurance in your ankle. Endurance is theability to use your muscles for a long time, even after they get tired. Ankle eversion Sit on the floor with your legs straight out in front of you. Loop a rubber exercise band around the ball of your left / right foot. The ball of your foot is on the walking surface, right under your toes. Hold the ends of the band  in your hands, or secure the band to a stable object. Slowly push your foot outward, away from your other leg (eversion). Hold this position for 10 seconds. Slowly return your foot to the starting position. Repeat 10 times. Complete this exercise 2 times a day. Heel walking  This exercise strengthens the muscles that push the ankle backward to point your toes toward your knee (ankle dorsiflexors). Walk on your heels for 10 ft. Keep your toes as high as possible. Repeat 10 times. Complete this exercise 2  times per day. Toe walking  This exercise strengthens the muscles that push the ankle forward and your toes downward (ankle plantar flexors). Walk on your toes for 10 ft. Keep your heels as high as possible. Repeat 10  times. Complete this exercise 2  times per day. Balance exercises These exercises improve or maintain your balance. Balance is important inimproving ankle stability and preventing falls. Tandem walking Do this exercise in a hallway or room that is at least 10 ft (3 m) long. Stand with one foot directly in front of the other (tandem). You can use the walls to help you balance if needed, but try not to use them for support. Slowly lift your back foot and place it directly in front of your other foot. Continue to walk in this heel-to-toe way for 10   ft. Repeat 10  times. Complete this exercise 2  times a day. Single leg stand Without shoes, stand near a railing or in a doorway. You may hold on to the railing or door frame as needed for balance. Stand on your left / right foot. Keep your big toe down on the floor and try to keep your arch lifted. If this is too easy, you can try doing it while you do one of these actions: Keep your eyes closed. Stand on a pillow. Throw a ball against a wall and catch it when it returns. Hold this position for 10 seconds. Repeat 10 times. Complete this exercise 2 times a day. Ankle inversion and ankle eversion This exercise is also called  foot rotation with a balance board. It uses a balance board to rotate the foot and ankle inward (inversion) and outward (eversion). Ask your health care provider where you can get a balance board or how you can make one. Stand on a non-carpeted surface near a countertop or wall. Step onto the balance board so your feet are hip width apart. Keep your feet in place, and keep your upper body and hips steady. Using only your feet and ankles to move the board, do the following exercises: Tip the board from side to side as far as you can, alternating between tipping to the left and tipping to the right. Tip the board so it silently taps the floor. Do not let the board forcefully hit the floor. From time to time, pause to hold a steady midway position, with neither the right nor the left sides touching the ground. Tip the board side to side so the board does not hit the floor at all. From time to time, pause to hold a steady midway position. Repeat 10 times, pausing from time to time to hold a steady position.Complete this exercise 2 times a day. Ankle plantar flexion and ankle dorsiflexion This exercise is also called foot flexion with a balance board. It uses a balance board to push the foot downward and away from the leg (plantar flexion) or upward and toward the leg (dorsiflexion). Ask your health care provider where you can get a balance board or how you can make one. Stand on a non-carpeted surface near a countertop or wall. Step onto the balance board so your feet are hip width apart. Keep your feet in place, and keep your upper body and hips steady. Using only your feet and ankles, do the following exercises: Tip the board forward and backward so the board silently taps the floor. Do not let the board forcefully hit the floor. From time to time, pause to hold a steady position midway between touching the floor in front and touching the floor in back. Tip the board forward and backward so the board  does not hit the floor at all. From time to time, pause to hold a steady position. Repeat 10 times, pausing from time to time to hold a steady position.Complete this exercise 2 times a day. This information is not intended to replace advice given to you by your health care provider. Make sure you discuss any questions you have with your healthcare provider. Document Revised: 08/06/2018 Document Reviewed: 01/27/2018 Elsevier Patient Education  2022 [employer]  

## 2024-06-03 ENCOUNTER — Other Ambulatory Visit: Payer: Self-pay

## 2024-06-03 ENCOUNTER — Encounter: Payer: Self-pay | Admitting: Podiatry

## 2024-06-03 MED ORDER — FAMOTIDINE 20 MG PO TABS
20.0000 mg | ORAL_TABLET | Freq: Two times a day (BID) | ORAL | 0 refills | Status: AC
  Filled 2024-06-03: qty 60, 30d supply, fill #0

## 2024-06-03 MED ORDER — AMPHETAMINE-DEXTROAMPHET ER 20 MG PO CP24
20.0000 mg | ORAL_CAPSULE | Freq: Every day | ORAL | 0 refills | Status: AC
  Filled 2024-06-03: qty 30, 30d supply, fill #0

## 2024-06-03 NOTE — Progress Notes (Signed)
 "  Subjective:  Patient ID: Theresa Ortiz, female    DOB: 04-May-1990,  MRN: [id number]  Chief Complaint  Patient presents with   Foot Problem    RM1 Patient is here for surgical consultation.    34 y.o. female presents with the above complaint. History confirmed with patient.  She is referred to me by Dr. Verta.  She has a history of chronic ankle instability and ankle pain.  She completed an MRI in follow-up last year.  Objective:  Physical Exam: warm, good capillary refill, no trophic changes or ulcerative lesions, normal DP and PT pulses, normal sensory exam, and she has no pain to palpation of the posterior tibial tendon.  She has slight tenderness on the anterior joint line with maximum dorsiflexion especially laterally.  Tenderness over the ATFL.  Positive anterior drawer.  Negative talar tilt.   Radiographs: Multiple views x-ray of right foot and ankle taken on 07/21/2023 and 07/23/2023 shows no gross deformity no significant bony abnormality at the ankle mortise is well-maintained without evidence of osteochondral fracture or further lesion.  Study Result  Narrative & Impression  CLINICAL DATA:  Chronic right ankle pain and swelling   EXAM: MRI OF THE RIGHT ANKLE WITHOUT CONTRAST   TECHNIQUE: Multiplanar, multisequence MR imaging of the ankle was performed. No intravenous contrast was administered.   COMPARISON:  X-ray 07/21/2023   FINDINGS: TENDONS   Peroneal: Peroneus longus and brevis tendons are intact and normally positioned.   Posteromedial: Tendinosis of the tibialis posterior tendon distally. The flexor hallucis longus and flexor digitorum longus tendons are intact and normally positioned.   Anterior: Tibialis anterior, extensor hallucis longus, and extensor digitorum longus tendons are intact and normally positioned.   Achilles: Intact.   Plantar Fascia: Intact.   LIGAMENTS   Lateral: Intact tibiofibular ligaments. Anterior talofibular ligament  appears chronically torn. The posterior talofibular ligament is intact. Intact calcaneofibular ligament.   Medial: The deltoid and visualized portions of the spring ligament appear intact.   CARTILAGE AND BONES   Ankle Joint: Chondral thinning at the lateral talar shoulder. Small marginal osteophytes along the anterior margin of the tibiotalar joint. Degenerative spurring within the medial and lateral gutters. Trace tibiotalar joint effusion.   Subtalar Joints/Sinus Tarsi: No cartilage defect. No effusion. Mild edema within the sinus tarsi.   Bones: No acute fracture. Pes planovalgus alignment. Small plantar calcaneal spur. No marrow signal abnormality. No suspicious bone lesion.   Other: No significant soft tissue findings.   IMPRESSION: 1. 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     Electronically Signed   By: Mabel Converse D.O.   On: 02/26/2024 08:44   Assessment:     ICD-10-CM   1. Right ankle instability  M25.371     2. Sprain of anterior talofibular ligament of right ankle, subsequent encounter  S93.491D     3. 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  Reverdin Exam findings with the patient as well as her previously completed MRI and foot ankle radiographs which I independently interpreted as well as the report.  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  She has a positive anterior drawer.  Her MRI shows some thinning of the lateral talar shoulder.  There is no osteochondral fracture  or cystic change or collapse of the dome.  We discussed that nonoperative treatment long-term likely will lead to continued instability and ankle arthritis at a young age.  I would recommend surgical intervention at this  point.  Certainly is not an emergent issue that needs to be addressed right away but will consider this in the next 1 to 3 years.  She is getting married later this year and is interested in having children with her spouse before having surgery.  As long as her condition does not acutely worsen I think this is reasonable.  We discussed possible reinjury and supporting with bracing when she is going to be on any uneven ground such as hiking yard work etc. but daily regular use likely does not need bracing.  She may benefit from continue to utilize a hightop top shoe that will cover the ankle.  She will let me know when she is ready to proceed with surgery.  Since we are possibly planning for surgery at a later date and her MRI is completed in October 2025 I would recommend a new MRI posterior to surgery to reevaluate the cartilaginous thinning on the lateral talar dome determine if there is any further osteochondral lesion that may require grafting  No follow-ups on file.   "
